# Patient Record
Sex: Male | Born: 1955 | Race: Black or African American | Hispanic: No | Marital: Single | Smoking: Former smoker
Health system: Southern US, Community
[De-identification: ages and names within clinical notes are randomized; demographics above are authoritative.]

## PROBLEM LIST (undated history)

## (undated) DIAGNOSIS — I4811 Longstanding persistent atrial fibrillation: Secondary | ICD-10-CM

## (undated) DIAGNOSIS — Z720 Tobacco use: Secondary | ICD-10-CM

## (undated) DIAGNOSIS — I10 Essential (primary) hypertension: Secondary | ICD-10-CM

## (undated) DIAGNOSIS — I340 Nonrheumatic mitral (valve) insufficiency: Secondary | ICD-10-CM

## (undated) DIAGNOSIS — I428 Other cardiomyopathies: Secondary | ICD-10-CM

## (undated) DIAGNOSIS — I5022 Chronic systolic (congestive) heart failure: Secondary | ICD-10-CM

## (undated) DIAGNOSIS — I2510 Atherosclerotic heart disease of native coronary artery without angina pectoris: Secondary | ICD-10-CM

## (undated) DIAGNOSIS — J45909 Unspecified asthma, uncomplicated: Secondary | ICD-10-CM

## (undated) DIAGNOSIS — F1490 Cocaine use, unspecified, uncomplicated: Secondary | ICD-10-CM

## (undated) DIAGNOSIS — E559 Vitamin D deficiency, unspecified: Secondary | ICD-10-CM

## (undated) HISTORY — DX: Vitamin D deficiency, unspecified: E55.9

---

## 2014-10-15 ENCOUNTER — Emergency Department (HOSPITAL_COMMUNITY)
Admission: EM | Admit: 2014-10-15 | Discharge: 2014-10-15 | Disposition: A | Discharge status: Home / Self Care | Payer: Self-pay | Attending: Emergency Medicine | Admitting: Emergency Medicine

## 2014-10-15 ENCOUNTER — Encounter (HOSPITAL_COMMUNITY): Payer: Self-pay | Admitting: Emergency Medicine

## 2014-10-15 DIAGNOSIS — G8929 Other chronic pain: Secondary | ICD-10-CM | POA: Insufficient documentation

## 2014-10-15 DIAGNOSIS — X503XXA Overexertion from repetitive movements, initial encounter: Secondary | ICD-10-CM

## 2014-10-15 DIAGNOSIS — M25511 Pain in right shoulder: Secondary | ICD-10-CM | POA: Insufficient documentation

## 2014-10-15 DIAGNOSIS — Z87891 Personal history of nicotine dependence: Secondary | ICD-10-CM | POA: Insufficient documentation

## 2014-10-15 DIAGNOSIS — T148XXA Other injury of unspecified body region, initial encounter: Secondary | ICD-10-CM

## 2014-10-15 DIAGNOSIS — M7989 Other specified soft tissue disorders: Secondary | ICD-10-CM | POA: Insufficient documentation

## 2014-10-15 MED ORDER — NAPROXEN 500 MG PO TABS: 500.0000 mg | ORAL_TABLET | Freq: Two times a day (BID) | ORAL | Status: DC | PRN

## 2014-10-15 MED ORDER — HYDROCODONE-ACETAMINOPHEN 5-325 MG PO TABS: 1.0000 | ORAL_TABLET | Freq: Four times a day (QID) | ORAL | Status: DC | PRN

## 2014-10-15 NOTE — Discharge Instructions (Signed)
Use naprosyn twice daily with food for 7 days, then start taking it as needed. Use norco as needed for severe pain, don't drive or operate heavy machinery while taking this medication. Use heat to the affected after, 20 minutes every hour. Follow up with the orthopedist listed above in 1 week for ongoing management. Perform gentle range of motion exercises as directed below to help with your shoulder injury. Return to the ER for changes or worsening symptoms.   Shoulder Pain The shoulder is the joint that connects your arms to your body. The bones that form the shoulder joint include the upper arm bone (humerus), the shoulder blade (scapula), and the collarbone (clavicle). The top of the humerus is shaped like a ball and fits into a rather flat socket on the scapula (glenoid cavity). A combination of muscles and strong, fibrous tissues that connect muscles to bones (tendons) support your shoulder joint and hold the ball in the socket. Small, fluid-filled sacs (bursae) are located in different areas of the joint. They act as cushions between the bones and the overlying soft tissues and help reduce friction between the gliding tendons and the bone as you move your arm. Your shoulder joint allows a wide range of motion in your arm. This range of motion allows you to do things like scratch your back or throw a ball. However, this range of motion also makes your shoulder more prone to pain from overuse and injury. Causes of shoulder pain can originate from both injury and overuse and usually can be grouped in the following four categories:  Redness, swelling, and pain (inflammation) of the tendon (tendinitis) or the bursae (bursitis).  Instability, such as a dislocation of the joint.  Inflammation of the joint (arthritis).  Broken bone (fracture). HOME CARE INSTRUCTIONS   Apply ice to the sore area.  Put ice in a plastic bag.  Place a towel between your skin and the bag.  Leave the ice on for 15-20  minutes, 3-4 times per day for the first 2 days, or as directed by your health care provider.  Stop using cold packs if they do not help with the pain.  If you have a shoulder sling or immobilizer, wear it as long as your caregiver instructs. Only remove it to shower or bathe. Move your arm as little as possible, but keep your hand moving to prevent swelling.  Squeeze a soft ball or foam pad as much as possible to help prevent swelling.  Only take over-the-counter or prescription medicines for pain, discomfort, or fever as directed by your caregiver. SEEK MEDICAL CARE IF:   Your shoulder pain increases, or new pain develops in your arm, hand, or fingers.  Your hand or fingers become cold and numb.  Your pain is not relieved with medicines. SEEK IMMEDIATE MEDICAL CARE IF:   Your arm, hand, or fingers are numb or tingling.  Your arm, hand, or fingers are significantly swollen or turn white or blue. MAKE SURE YOU:   Understand these instructions.  Will watch your condition.  Will get help right away if you are not doing well or get worse. Document Released: 04/22/2005 Document Revised: 11/27/2013 Document Reviewed: 06/27/2011 [facility] Patient Information 2015 [place], [place]. This information is not intended to replace advice given to you by your health care provider. Make sure you discuss any questions you have with your health care provider.  Repetitive Strain Injuries Repetitive strain injuries (RSIs) result from overuse or misuse of soft tissues including muscles, tendons,  or nerves. Tendons are the cord-like structures that attach muscles to bones. RSIs can affect almost any part of the body. However, RSIs are most common in the arms (thumbs, wrists, elbows, shoulders) and legs (ankles, knees). Common medical conditions that are often caused by repetitive strain include carpal tunnel syndrome, tennis or golfer's elbow, bursitis, and tendonitis. If RSIs are treated early, and  therepeated activity is reduced or removed, the severity and length of your problems can usually be reduced. RSIs are also called cumulative trauma disorders (CTD).  CAUSES  Many RSIs occur due to repeating the same activity at work over weeks or months without sufficient rest, such as prolonged typing. RSIs also commonly occur when a hobby or sport is done repeatedly without sufficient rest. RSIs can also occur due to repeated strain or stress on a body part in someone who has one or more risk factors for RSIs. RISK FACTORS Workplace risk factors  Frequent computer use, especially if your workstation is not adjusted for your body type.  Infrequent rest breaks.  Working in a high-pressure environment.  Working at a [employer].  Repeating the same motion, such as frequent typing.  Working in an awkward position or holding the same position for a long time.  Forceful movements such as lifting, pulling, or pushing.  Vibration caused by using power tools.  Working in cold temperatures.  Job stress. Personal risk factors  Poor posture.  Being loose-jointed.  Not exercising regularly.  Being overweight.  Arthritis, diabetes, thyroid problems, or other long-term (chronic)medical conditions.  Vitamin deficiencies.  Keeping your fingernails long.  An unhealthy, stressful, or inactive lifestyle.  Not sleeping well. SYMPTOMS  Symptoms often begin at work but become more noticeable after the repeated stress has ended. For example, you may develop fatigue or soreness in your wrist while typingat work, and at night you may develop numbness and tingling in your fingers. Common symptoms include:   Burning, shooting, or aching pain, especially in the fingers, palms, wrists, forearms, or shoulders.  Tenderness.  Swelling.  Tingling, numbness, or loss of feeling.  Pain with certain activities, such as turning a doorknob or reaching above your head.  Weakness, heaviness, or  loss of coordination in yourhand.  Muscle spasms or tightness. In some cases, symptoms can become so intense that it is difficult to perform everyday tasks. Symptoms that do not improve with rest may indicate a more serious condition.  DIAGNOSIS  Your caregiver may determine the type ofRSI you have based on your medical evaluation and a description of your activities.  TREATMENT  Treatment depends on the severity and type of RSI you have. Your caregiver may recommend rest for the affected body part, medicines, and physical or occupational therapy to reduce pain, swelling, and soreness. Discuss the activities you do repeatedly with your caregiver. Your caregiver can help you decide whether you need to change your activities. An RSI may take months or years to heal, especially if the affected body part gets insufficient rest. In some cases, such as severe carpal tunnel syndrome, surgery may be recommended. PREVENTION  Talk with your supervisor to make sure you have the proper equipment for your work station.  Maintain good posture at your desk or work station with:  Feet flat on the floor.  Knees directly over the feet, bent at a right angle.  Lower back supported by your chair or a cushion in the curve of your lower back.  Shoulders and arms relaxed and at your sides.  Neck relaxed and not bent forwards or backwards.  Your desk and computer workstation properly adjusted to your body type.  Your chair adjusted so there is no excess pressure on the back of your thighs.  The keyboard resting above your thighs. You should be able to reach the keys with your elbows at your side, bent at a right angle. Your arms should be supported on forearm rests, with your forearms parallel to the ground.  The computer mouse within easy reach.  The monitor directly in front of you, so that your eyes are aligned with the top of the screen. The screen should be about 15 to 25 inches from your  eyes.  While typing, keep your wrist straight, in a neutral position. Move your entire arm when you move your mouse or when typing hard-to-reach keys.  Only use your computer as much as you need to for work. Do not use it during breaks.  Take breaks often from any repeated activity. Alternate with another task which requires you to use different muscles, or rest at least once every hour.  Change positions regularly. If you spend a lot of time sitting, get up, walk around, and stretch.  Do not hold pens or pencils tightly when writing.  Exercise regularly.  Maintain a normal weight.  Eat a diet with plenty of vegetables, whole grains, and fruit.  Get sufficient, restful sleep. HOME CARE INSTRUCTIONS  If your caregiver prescribed medicine to help reduce swelling, take it as directed.  Only take over-the-counter or prescription medicines for pain, discomfort, or fever as directed by your caregiver.  Reduce, and if needed, stopthe activities that are causing your problems until you have no further symptoms.If your symptoms are work-related, you may need to talk to your supervisor about changing your activities.  When symptoms develop, put ice or a cold pack on the aching area.  Put ice in a plastic bag.  Place a towel between your skin and the bag.  Leave the ice on for 15-20 minutes.  If you were given a splint to keep your wrist from bending, wear it as instructed. It is important to wear the splint at night. Use the splint for as long as your caregiver recommends. SEEK MEDICAL CARE IF:  You develop new problems.  Your problems do not get better with medicine. MAKE SURE YOU:  Understand these instructions.  Will watch your condition.  Will get help right away if you are not doing well or get worse. Document Released: 07/03/2002 Document Revised: 01/12/2012 Document Reviewed: 09/03/2011 [facility] Patient Information 2015 [place], [place]. This information is not intended  to replace advice given to you by your health care provider. Make sure you discuss any questions you have with your health care provider.  Shoulder Range of Motion Exercises The shoulder is the most flexible joint in the human body. Because of this it is also the most unstable joint in the body. All ages can develop shoulder problems. Early treatment of problems is necessary for a good outcome. People react to shoulder pain by decreasing the movement of the joint. After a brief period of time, the shoulder can become "frozen". This is an almost complete loss of the ability to move the damaged shoulder. Following injuries your caregivers can give you instructions on exercises to keep your range of motion (ability to move your shoulder freely), or regain it if it has been lost.  EXERCISES EXERCISES TO MAINTAIN THE MOBILITY OF YOUR SHOULDER: Codman's Exercise or Pendulum  Exercise  This exercise may be performed in a prone (face-down) lying position or standing while leaning on a chair with the opposite arm. Its purpose is to relax the muscles in your shoulder and slowly but surely increase the range of motion and to relieve pain.  Lie on your stomach close to the side edge of the bed. Let your weak arm hang over the edge of the bed. Relax your shoulder, arm and hand. Let your shoulder blade relax and drop down.  Slowly and gently swing your arm forward and back. Do not use your neck muscles; relax them. It might be easier to have someone else gently start swinging your arm.  As pain decreases, increase your swing. To start, arm swing should begin at 15 degree angles. In time and as pain lessens, move to 30-45 degree angles. Start with swinging for about 15 seconds, and work towards swinging for 3 to 5 minutes.  This exercise may also be performed in a standing/bent over position.  Stand and hold onto a sturdy chair with your good arm. Bend forward at the waist and bend your knees slightly to help  protect your back. Relax your weak arm, let it hang limp. Relax your shoulder blade and let it drop.  Keep your shoulder relaxed and use body motion to swing your arm in small circles.  Stand up tall and relax.  Repeat motion and change direction of circles.  Start with swinging for about 30 seconds, and work towards swinging for 3 to 5 minutes. STRETCHING EXERCISES:  Lift your arm out in front of you with the elbow bent at 90 degrees. Using your other arm gently pull the elbow forward and across your body.  Bend one arm behind you with the palm facing outward. Using the other arm, hold a towel or rope and reach this arm up above your head, then bend it at the elbow to move your wrist to behind your neck. Grab the free end of the towel with the hand behind your back. Gently pull the towel up with the hand behind your neck, gradually increasing the pull on the hand behind the small of your back. Then, gradually pull down with the hand behind the small of your back. This will pull the hand and arm behind your neck further. Both shoulders will have an increased range of motion with repetition of this exercise. STRENGTHENING EXERCISES:  Standing with your arm at your side and straight out from your shoulder with the elbow bent at 90 degrees, hold onto a small weight and slowly raise your hand so it points straight up in the air. Repeat this five times to begin with, and gradually increase to ten times. Do this four times per day. As you grow stronger you can gradually increase the weight.  Repeat the above exercise, only this time using an elastic band. Start with your hand up in the air and pull down until your hand is by your side. As you grow stronger, gradually increase the amount you pull by increasing the number or size of the elastic bands. Use the same amount of repetitions.  Standing with your hand at your side and holding onto a weight, gradually lift the hand in front of you until it is  over your head. Do the same also with the hand remaining at your side and lift the hand away from your body until it is again over your head. Repeat this five times to begin with, and gradually  increase to ten times. Do this four times per day. As you grow stronger you can gradually increase the weight. Document Released: 04/11/2003 Document Revised: 07/18/2013 Document Reviewed: 07/13/2005 [facility] Patient Information 2015 [place], [place]. This information is not intended to replace advice given to you by your health care provider. Make sure you discuss any questions you have with your health care provider.  Heat Therapy Heat therapy can help ease sore, stiff, injured, and tight muscles and joints. Heat relaxes your muscles, which may help ease your pain.  RISKS AND COMPLICATIONS If you have any of the following conditions, do not use heat therapy unless your health care provider has approved:  Poor circulation.  Healing wounds or scarred skin in the area being treated.  Diabetes, heart disease, or high blood pressure.  Not being able to feel (numbness) the area being treated.  Unusual swelling of the area being treated.  Active infections.  Blood clots.  Cancer.  Inability to communicate pain. This may include young children and people who have problems with their brain function (dementia).  Pregnancy. Heat therapy should only be used on old, pre-existing, or long-lasting (chronic) injuries. Do not use heat therapy on new injuries unless directed by your health care provider. HOW TO USE HEAT THERAPY There are several different kinds of heat therapy, including:  Moist heat pack.  Warm water bath.  Hot water bottle.  Electric heating pad.  Heated gel pack.  Heated wrap.  Electric heating pad. Use the heat therapy method suggested by your health care provider. Follow your health care provider's instructions on when and how to use heat therapy. GENERAL HEAT THERAPY  RECOMMENDATIONS  Do not sleep while using heat therapy. Only use heat therapy while you are awake.  Your skin may turn pink while using heat therapy. Do not use heat therapy if your skin turns red.  Do not use heat therapy if you have new pain.  High heat or long exposure to heat can cause burns. Be careful when using heat therapy to avoid burning your skin.  Do not use heat therapy on areas of your skin that are already irritated, such as with a rash or sunburn. SEEK MEDICAL CARE IF:  You have blisters, redness, swelling, or numbness.  You have new pain.  Your pain is worse. MAKE SURE YOU:  Understand these instructions.  Will watch your condition.  Will get help right away if you are not doing well or get worse. Document Released: 10/05/2011 Document Revised: 11/27/2013 Document Reviewed: 09/05/2013 [facility] Patient Information 2015 [place], [place]. This information is not intended to replace advice given to you by your health care provider. Make sure you discuss any questions you have with your health care provider.

## 2014-10-15 NOTE — ED Notes (Signed)
Pt c/o right shoulder pain, states he thinks it was from the flu shot 2 months ago. States his ROM is bad.

## 2014-10-15 NOTE — ED Provider Notes (Signed)
CSN: [id number]     Arrival date & time 10/15/14  1244 History  This chart was scribed for non-physician practitioner Liborio Nixon- Soms, PA-C working with Purvis Sheffield, MD by Carl Best, ED Scribe. This patient was seen in room WTR8/WTR8 and the patient's care was started at 1:41 PM.    Chief Complaint  Patient presents with  . Shoulder Pain   HPI Comments: Kevin [name] is a 59 y.o. male with a PMHx of asthma, who presents to the Emergency Department complaining of gradually worsening, non-radiating, intermittent, sharp right shoulder pain that started 2 months ago after receiving a flu shot for work.  He started working 5 days after his flu shot He had never received a flu shot prior to this time.  He rates his pain 2-3/10 currently.  Movement aggravates his pain.  He used Salonpas and taken Advil and Aleve with temporary relief to his pain.  He stopped taking Salonpas cream due to skin irritation.  He denies neck pain, numbness and tingling, chest pain, SOB, abdominal pain, headaches, blurred vision, nausea, vomiting, fever, chills, and weakness.  He does not have a history of hypertension.    Patient is a 59 y.o. male presenting with shoulder pain. The history is provided by the patient. No language interpreter was used.  Shoulder Pain Location:  Shoulder Time since incident:  2 months Injury: no   Shoulder location:  R shoulder Pain details:    Quality:  Sharp   Radiates to:  Does not radiate   Severity:  Mild   Onset quality:  Gradual   Duration:  2 months   Timing:  Intermittent   Progression:  Worsening Chronicity:  New Dislocation: no   Prior injury to area:  No Relieved by:  NSAIDs and arthritis medications (salon pas, aleve, ibuprofen) Worsened by:  Movement Ineffective treatments:  None tried Associated symptoms: no back pain, no decreased range of motion, no fever, no muscle weakness, no neck pain, no numbness, no stiffness, no swelling and no tingling      History reviewed. No pertinent past medical history. History reviewed. No pertinent past surgical history. No family history on file. History  Substance Use Topics  . Smoking status: Former Games developer  . Smokeless tobacco: Not on file  . Alcohol Use: No    Review of Systems  Constitutional: Negative for fever and chills.  Eyes: Negative for visual disturbance.  Respiratory: Negative for shortness of breath.   Cardiovascular: Negative for chest pain.  Gastrointestinal: Negative for nausea, vomiting and abdominal pain.  Musculoskeletal: Positive for arthralgias. Negative for back pain, joint swelling, stiffness, neck pain and neck stiffness.  Skin: Negative for color change.  Allergic/Immunologic: Negative for immunocompromised state.  Neurological: Negative for weakness, numbness and headaches.   10 Systems reviewed and all are negative for acute change except as noted in the HPI.  Allergies  Review of patient's allergies indicates no known allergies.  Home Medications   Prior to Admission medications   Not on File   Triage Vitals: BP 144/105 mmHg  Pulse 97  Temp(Src) 98.2 F (36.8 C) (Oral)  Resp 21  SpO2 98%  Physical Exam  Constitutional: He is oriented to person, place, and time. Vital signs are normal. He appears well-developed and well-nourished.  Non-toxic appearance. No distress.  Afebrile, nontoxic, NAD  HENT:  Head: Normocephalic and atraumatic.  Mouth/Throat: Mucous membranes are normal.  Eyes: Conjunctivae and EOM are normal. Right eye exhibits no discharge. Left eye exhibits no discharge.  Neck: Normal range of motion. Neck supple. No spinous process tenderness and no muscular tenderness present. No rigidity. Normal range of motion present.  FROM intact without spinous process or paraspinous muscle TTP, no bony stepoffs or deformities, no muscle spasms.  Cardiovascular: Normal rate and intact distal pulses.   Pulmonary/Chest: Effort normal. No respiratory  distress.  Abdominal: Normal appearance. He exhibits no distension.  Musculoskeletal: Normal range of motion.  Right shoulder with FROM intact.  No focal bony TTP, with no TTP along any muscular insertion sites.  Empty can test positive.  Negative Apley scratch, negative pain with resisted internal/external rotation.  Strength and sensation grossly intact.  No joint swelling, erythema, or warmth.  Neurological: He is alert and oriented to person, place, and time. He has normal strength. No sensory deficit.  Skin: Skin is warm, dry and intact. No rash noted.  Psychiatric: He has a normal mood and affect.  Nursing note and vitals reviewed.   ED Course  Procedures (including critical care time)  DIAGNOSTIC STUDIES: Oxygen Saturation is 98% on room air, normal by my interpretation.    COORDINATION OF CARE: 1:48 PM- Will refer the patient to an orthopedist for further evaluation and discharge patient with Naprosyn to be taken once daily until his appointment with the orthopedist, narcotic pain medication, and antiinflammatory.  Advised the patient to discontinue using Salonpas and apply heat to the area.  The patient agreed to the treatment plan.    Labs Review Labs Reviewed - No data to display  Imaging Review No results found.   EKG Interpretation None      MDM   Final diagnoses:  Chronic shoulder pain, right  Repetitive motion injury    59 y.o. male here with R shoulder pain x2 months. Was previously in a job with repetitive motion. Neurovascularly intact with soft compartments. No bony TTP. Pain with empty can testing. Likely repetitive motion strain vs rotator cuff injury. Given that it's not acute, will not immobilize joint in order to avoid adhesive capsulitis. Will have him f/up with ortho. Discussed heat therapy and pain control. Discussed naprosyn BID x7 days then PRN after. I explained the diagnosis and have given explicit precautions to return to the ER including for any  other new or worsening symptoms. The patient understands and accepts the medical plan as it's been dictated and I have answered their questions. Discharge instructions concerning home care and prescriptions have been given. The patient is STABLE and is discharged to home in good condition.   I personally performed the services described in this documentation, which was scribed in my presence. The recorded information has been reviewed and is accurate.  BP 144/105 mmHg  Pulse 97  Temp(Src) 98.2 F (36.8 C) (Oral)  Resp 21  SpO2 98%  Meds ordered this encounter  Medications  . naproxen (NAPROSYN) 500 MG tablet    Sig: Take 1 tablet (500 mg total) by mouth 2 (two) times daily as needed for mild pain, moderate pain or headache (TAKE WITH MEALS.).    Dispense:  20 tablet    Refill:  0    Order Specific Question:  Supervising Provider    Answer:  MILLER, BRIAN [[id number]]  . HYDROcodone-acetaminophen (NORCO) 5-325 MG per tablet    Sig: Take 1 tablet by mouth every 6 (six) hours as needed for severe pain.    Dispense:  10 tablet    Refill:  0    Order Specific Question:  Supervising Provider  Answer:  Eber Hong [address] Keniyah Gelinas Camprubi-Soms, PA-C 10/15/14 1401  Purvis Sheffield, MD 10/15/14 2114

## 2015-07-23 ENCOUNTER — Emergency Department (HOSPITAL_COMMUNITY): Payer: Self-pay

## 2015-07-23 ENCOUNTER — Encounter (HOSPITAL_COMMUNITY): Payer: Self-pay | Admitting: Emergency Medicine

## 2015-07-23 ENCOUNTER — Inpatient Hospital Stay (HOSPITAL_COMMUNITY)
Admission: EM | Admit: 2015-07-23 | Discharge: 2015-07-28 | DRG: 286 | Disposition: A | Discharge status: Home / Self Care | Payer: Self-pay | Attending: Family Medicine | Admitting: Family Medicine

## 2015-07-23 DIAGNOSIS — M7501 Adhesive capsulitis of right shoulder: Secondary | ICD-10-CM | POA: Diagnosis present

## 2015-07-23 DIAGNOSIS — Z87891 Personal history of nicotine dependence: Secondary | ICD-10-CM

## 2015-07-23 DIAGNOSIS — I5022 Chronic systolic (congestive) heart failure: Secondary | ICD-10-CM | POA: Diagnosis present

## 2015-07-23 DIAGNOSIS — R7989 Other specified abnormal findings of blood chemistry: Secondary | ICD-10-CM | POA: Diagnosis present

## 2015-07-23 DIAGNOSIS — I428 Other cardiomyopathies: Secondary | ICD-10-CM | POA: Insufficient documentation

## 2015-07-23 DIAGNOSIS — Z8249 Family history of ischemic heart disease and other diseases of the circulatory system: Secondary | ICD-10-CM

## 2015-07-23 DIAGNOSIS — R778 Other specified abnormalities of plasma proteins: Secondary | ICD-10-CM | POA: Diagnosis present

## 2015-07-23 DIAGNOSIS — R748 Abnormal levels of other serum enzymes: Secondary | ICD-10-CM | POA: Diagnosis present

## 2015-07-23 DIAGNOSIS — I429 Cardiomyopathy, unspecified: Secondary | ICD-10-CM | POA: Diagnosis present

## 2015-07-23 DIAGNOSIS — I5021 Acute systolic (congestive) heart failure: Secondary | ICD-10-CM | POA: Diagnosis present

## 2015-07-23 DIAGNOSIS — I480 Paroxysmal atrial fibrillation: Secondary | ICD-10-CM | POA: Diagnosis present

## 2015-07-23 DIAGNOSIS — I340 Nonrheumatic mitral (valve) insufficiency: Secondary | ICD-10-CM | POA: Diagnosis present

## 2015-07-23 DIAGNOSIS — J449 Chronic obstructive pulmonary disease, unspecified: Secondary | ICD-10-CM | POA: Diagnosis present

## 2015-07-23 DIAGNOSIS — I4581 Long QT syndrome: Secondary | ICD-10-CM | POA: Diagnosis present

## 2015-07-23 DIAGNOSIS — J45909 Unspecified asthma, uncomplicated: Secondary | ICD-10-CM | POA: Diagnosis present

## 2015-07-23 DIAGNOSIS — R911 Solitary pulmonary nodule: Secondary | ICD-10-CM | POA: Diagnosis present

## 2015-07-23 DIAGNOSIS — I2510 Atherosclerotic heart disease of native coronary artery without angina pectoris: Secondary | ICD-10-CM | POA: Diagnosis present

## 2015-07-23 DIAGNOSIS — I4891 Unspecified atrial fibrillation: Principal | ICD-10-CM | POA: Diagnosis present

## 2015-07-23 HISTORY — DX: Atherosclerotic heart disease of native coronary artery without angina pectoris: I25.10

## 2015-07-23 HISTORY — DX: Nonrheumatic mitral (valve) insufficiency: I34.0

## 2015-07-23 HISTORY — DX: Unspecified asthma, uncomplicated: J45.909

## 2015-07-23 HISTORY — DX: Other cardiomyopathies: I42.8

## 2015-07-23 HISTORY — DX: Chronic systolic (congestive) heart failure: I50.22

## 2015-07-23 LAB — BASIC METABOLIC PANEL
Anion gap: 8 (ref 5–15)
BUN: 11 mg/dL (ref 6–20)
CO2: 26 mmol/L (ref 22–32)
Calcium: 9.3 mg/dL (ref 8.9–10.3)
Chloride: 107 mmol/L (ref 101–111)
Creatinine, Ser: 0.95 mg/dL (ref 0.61–1.24)
GFR calc Af Amer: >60 mL/min (ref >60)
GFR calc non Af Amer: >60 mL/min (ref >60)
GLUCOSE: 83 mg/dL (ref 65–99)
Potassium: 3.8 mmol/L (ref 3.5–5.1)
Sodium: 141 mmol/L (ref 135–145)

## 2015-07-23 LAB — CBC
HEMATOCRIT: 40.3 % (ref 39.0–52.0)
HEMOGLOBIN: 13.1 g/dL (ref 13.0–17.0)
MCH: 26.4 pg (ref 26.0–34.0)
MCHC: 32.5 g/dL (ref 30.0–36.0)
MCV: 81.3 fL (ref 78.0–100.0)
Platelets: 224 10*3/uL (ref 150–400)
RBC: 4.96 MIL/uL (ref 4.22–5.81)
RDW: 14.4 % (ref 11.5–15.5)
WBC: 8.8 10*3/uL (ref 4.0–10.5)

## 2015-07-23 LAB — TROPONIN I: Troponin I: 0.07 ng/mL — ABNORMAL HIGH (ref <0.031)

## 2015-07-23 LAB — TSH: TSH: 1.189 u[IU]/mL (ref 0.350–4.500)

## 2015-07-23 LAB — I-STAT TROPONIN, ED: TROPONIN I, POC: 0.04 ng/mL (ref 0.00–0.08)

## 2015-07-23 LAB — BRAIN NATRIURETIC PEPTIDE: B Natriuretic Peptide: 692.4 pg/mL — ABNORMAL HIGH (ref 0.0–100.0)

## 2015-07-23 LAB — MAGNESIUM: Magnesium: 1.7 mg/dL (ref 1.7–2.4)

## 2015-07-23 MED ORDER — METOPROLOL TARTRATE 1 MG/ML IV SOLN
5.0000 mg | Freq: Once | INTRAVENOUS | Status: AC
Start: 2015-07-23 — End: 2015-07-23
  Administered 2015-07-23: 5 mg via INTRAVENOUS
  Filled 2015-07-23: qty 5

## 2015-07-23 MED ORDER — PANTOPRAZOLE SODIUM 40 MG PO TBEC
40.0000 mg | DELAYED_RELEASE_TABLET | Freq: Every day | ORAL | Status: DC
  Administered 2015-07-24 – 2015-07-28 (×6): 40 mg via ORAL
  Filled 2015-07-23 (×6): qty 1

## 2015-07-23 MED ORDER — ONDANSETRON HCL 4 MG PO TABS: 4.0000 mg | ORAL_TABLET | Freq: Four times a day (QID) | ORAL | Status: DC | PRN

## 2015-07-23 MED ORDER — ONDANSETRON HCL 4 MG/2ML IJ SOLN: 4.0000 mg | Freq: Four times a day (QID) | INTRAMUSCULAR | Status: DC | PRN

## 2015-07-23 MED ORDER — MAGNESIUM SULFATE 2 GM/50ML IV SOLN
2.0000 g | Freq: Once | INTRAVENOUS | Status: AC
  Administered 2015-07-23: 2 g via INTRAVENOUS
  Filled 2015-07-23: qty 50

## 2015-07-23 MED ORDER — ASPIRIN 81 MG PO CHEW
324.0000 mg | CHEWABLE_TABLET | Freq: Once | ORAL | Status: AC
  Administered 2015-07-23: 324 mg via ORAL
  Filled 2015-07-23: qty 4

## 2015-07-23 MED ORDER — SODIUM CHLORIDE 0.9 % IJ SOLN: 3.0000 mL | INTRAMUSCULAR | Status: DC | PRN

## 2015-07-23 MED ORDER — IOHEXOL 350 MG/ML SOLN
100.0000 mL | Freq: Once | INTRAVENOUS | Status: AC | PRN
  Administered 2015-07-23: 78 mL via INTRAVENOUS

## 2015-07-23 MED ORDER — ENOXAPARIN SODIUM 100 MG/ML ~~LOC~~ SOLN
1.0000 mg/kg | Freq: Two times a day (BID) | SUBCUTANEOUS | Status: DC
  Administered 2015-07-24: 100 mg via SUBCUTANEOUS
  Filled 2015-07-23 (×3): qty 1

## 2015-07-23 MED ORDER — SODIUM CHLORIDE 0.9 % IJ SOLN
3.0000 mL | Freq: Two times a day (BID) | INTRAMUSCULAR | Status: DC
  Administered 2015-07-24 – 2015-07-27 (×7): 3 mL via INTRAVENOUS

## 2015-07-23 MED ORDER — DILTIAZEM HCL 100 MG IV SOLR
5.0000 mg/h | INTRAVENOUS | Status: DC
  Administered 2015-07-23 – 2015-07-24 (×2): 5 mg/h via INTRAVENOUS
  Filled 2015-07-23: qty 100

## 2015-07-23 MED ORDER — SODIUM CHLORIDE 0.9 % IV SOLN: 250.0000 mL | INTRAVENOUS | Status: DC | PRN

## 2015-07-23 NOTE — Progress Notes (Signed)
CM spoke with pt who confirms uninsured [place] resident with no pcp.  CM discussed and provided written information for uninsured accepting pcps, discussed the importance of pcp vs EDP services for f/u care, www.needymeds.org, www.goodrx.com, discounted pharmacies and other [place] resources such as [facility] , [facility], affordable care act, financial assistance, uninsured dental services, [place] med assist, DSS and  health department  Reviewed resources for [place] uninsured accepting pcps like [facility], [facility] [address], [facility], [facility], local urgent care centers, [facility], [facility], general medical clinics, [facility], [facility] plus others, medication resources, [facility] out patient [facility]ies and housing Pt voiced understanding and appreciation of resources provided   Provided [facility] contact information Pt agreed to a referral Cm completed referral Pt to be contact by [facility] clinical liason  

## 2015-07-23 NOTE — Progress Notes (Deleted)
Pt pcp is at [facility]. [address], [address] [place], [place] [address] t: [phone] Visiting doctors Marlowe Aschoff per daughter

## 2015-07-23 NOTE — Progress Notes (Signed)
Entered in d/c instructions Please use the resources provided to you in emergency room by case manager to assist with doctor for follow up Schedule an appointment as soon as possible for a visit on 07/24/2015 A referral for you has been sent to [facility] if you have not received a call in 3 days you may contact them Call Scherry Ran at [phone] Tuesday-Friday [email or web address] These [place] uninsured resources provide possible primary care providers, resources for discounted medications, housing, dental resources, affordable care act information, plus other resources for [facility]

## 2015-07-23 NOTE — ED Notes (Signed)
Pt just arriving to room,  Nurse will draw labs

## 2015-07-23 NOTE — ED Notes (Addendum)
Spoke with Elnita Maxwell, RN charge nurse in ICU. Bed assignment will be changed to 1228.

## 2015-07-23 NOTE — ED Provider Notes (Signed)
CSN: [id number]     Arrival date & time 07/23/15  1329 History   First MD Initiated Contact with Patient 07/23/15 1457     Chief Complaint  Patient presents with  . Shortness of Breath  . Irregular Heart Beat     (Consider location/radiation/quality/duration/timing/severity/associated sxs/prior Treatment) Patient is a 59 y.o. male presenting with shortness of breath. The history is provided by the patient and medical records. No language interpreter was used.  Shortness of Breath Associated symptoms: no abdominal pain, no cough, no headaches, no neck pain, no rash, no sore throat, no vomiting and no wheezing    Kevin [name] is a 59 y.o. male  with a PMH of asthma who presents to the Emergency Department complaining of dyspnea on exertion x 6 days. Patient states he normally walks 5 minutes to the grocery store, then walks home with no trouble. Over the past 6 days, he has been winded and needs to stop and catch his breath multiple times during his walk. He denies asscociated chest pain, head aches, palpitations, wheezing, recent illness or other associated symptoms. Upon arrival he was noticed to have an irregular heart rate. He states that his PCP had noticed this at his last appointment 8-9 months ago, and set him up for an outpatient EKG and workup, however, he moved to [facility] and therefore, did not make the follow up appointment.    Past Medical History  Diagnosis Date  . Asthma    History reviewed. No pertinent past surgical history. History reviewed. No pertinent family history. Social History  Substance Use Topics  . Smoking status: Former Games developer  . Smokeless tobacco: None  . Alcohol Use: No    Review of Systems  Constitutional: Negative.   HENT: Negative for congestion, rhinorrhea and sore throat.   Eyes: Negative for visual disturbance.  Respiratory: Positive for shortness of breath. Negative for cough and wheezing.   Cardiovascular: Negative.   Gastrointestinal:  Negative for nausea, vomiting, abdominal pain, diarrhea and constipation.  Musculoskeletal: Negative for myalgias, back pain, arthralgias and neck pain.  Skin: Negative for rash.  Neurological: Negative for dizziness, weakness and headaches.  Hematological: Does not bruise/bleed easily.      Allergies  Review of patient's allergies indicates no known allergies.  Home Medications   Prior to Admission medications   Medication Sig Start Date End Date Taking? Authorizing Provider  ePHEDrine-GuaiFENesin (BRONKAID) 25-400 MG TABS Take 1-2 tablets by mouth every 12 (twelve) hours as needed (congestion,cough,cold).   Yes Historical Provider, MD  ePHEDrine-GuaiFENesin (PRIMATENE ASTHMA PO) Take 1 tablet by mouth daily as needed (breathing).   Yes Historical Provider, MD   BP 136/102 mmHg  Pulse 51  Temp(Src) 98.4 F (36.9 C) (Oral)  Resp 24  SpO2 95% Physical Exam  Constitutional: He is oriented to person, place, and time. He appears well-developed and well-nourished.  Alert, speaking in full sentences, and in no acute distress  HENT:  Head: Normocephalic and atraumatic.  Neck: Normal range of motion. Neck supple. No JVD present.  No carotid bruits  Cardiovascular: Intact distal pulses.  Exam reveals no gallop and no friction rub.   No murmur heard. Irregularly irregular  Pulmonary/Chest: Effort normal. No respiratory distress. He exhibits no tenderness.  Crackles in bilateral lung fields; no wheezing  Abdominal: He exhibits no mass. There is no rebound and no guarding.  Abdomen soft, non-tender, non-distended Bowel sounds positive in all four quadrants  Musculoskeletal: He exhibits no edema.  Lymphadenopathy:    He  has no cervical adenopathy.  Neurological: He is alert and oriented to person, place, and time. No cranial nerve deficit.  Skin: Skin is warm and dry. No rash noted.  Psychiatric: He has a normal mood and affect. His behavior is normal. Judgment and thought content  normal.  Nursing note and vitals reviewed.   ED Course  Procedures (including critical care time) Labs Review Labs Reviewed  BRAIN NATRIURETIC PEPTIDE - Abnormal; Notable for the following:    B Natriuretic Peptide 692.4 (*)    All other components within normal limits  TROPONIN I - Abnormal; Notable for the following:    Troponin I 0.07 (*)    All other components within normal limits  BASIC METABOLIC PANEL  CBC  MAGNESIUM  TSH  I-STAT TROPOININ, ED    Imaging Review Dg Chest 2 View  07/23/2015  CLINICAL DATA:  Shortness of breath for 5 days, history asthma, former smoker EXAM: CHEST  2 VIEW COMPARISON:  None FINDINGS: Enlargement of cardiac silhouette with pulmonary vascular congestion. Elongation of thoracic aorta. Lungs minimally hyperinflated but grossly clear. No definite infiltrate, pleural effusion or pneumothorax. Bones unremarkable. IMPRESSION: Enlargement of cardiac silhouette with pulmonary vascular congestion. No definite acute infiltrate. Electronically Signed   By: Ulyses Southward M.D.   On: 07/23/2015 14:19   Ct Angio Chest Pe W/cm &/or Wo Cm  07/23/2015  CLINICAL DATA:  Shortness of breath. EXAM: CT ANGIOGRAPHY CHEST WITH CONTRAST TECHNIQUE: Multidetector CT imaging of the chest was performed using the standard protocol during bolus administration of intravenous contrast. Multiplanar CT image reconstructions and MIPs were obtained to evaluate the vascular anatomy. CONTRAST:  78mL OMNIPAQUE IOHEXOL 350 MG/ML SOLN COMPARISON:  Radiograph of same day. FINDINGS: No pneumothorax is noted. Mild right pleural effusion is noted. 9 mm subpleural nodule is noted in right lateral costophrenic sulcus. Minimal right posterior basilar subsegmental atelectasis is noted. Minimal opacity is noted anteriorly in the right upper lobe which may represent scarring or acute inflammation. Mild cardiomegaly is noted. There is no definite evidence of pulmonary embolus. Mildly enlarged mediastinal  lymph nodes are noted which most likely are inflammatory or reactive in etiology. Visualized portion of upper abdomen is unremarkable. No significant osseous abnormality is noted. Review of the MIP images confirms the above findings. IMPRESSION: No evidence of pulmonary embolus. Mild right pleural effusion is noted with minimal right posterior basilar subsegmental atelectasis. Minimal opacity is noted anteriorly in the right upper lobe which may represent scarring or acute inflammation. 9 mm nodule is noted in right lateral costophrenic sulcus. Followup unenhanced chest CT in 3 months is recommended to ensure stability. Electronically Signed   By: Lupita Raider, M.D.   On: 07/23/2015 17:42   I have personally reviewed and evaluated these images and lab results as part of my medical decision-making.   EKG Interpretation None      MDM   Final diagnoses:  New onset a-fib (HCC)  Kevin [name] presents with dyspnea on exertion x 6 days; Patient noticed to be in afib upon arrival. Per patient, he had an appointment with PCP ~ 8 months ago and was told he needed an EKG and further workup as an outpatient for his heart. Unsure of how long patient has been in afib.   Labs: Trop 0.07, BNP 692.4; CBC, BMP, TSH, and mag wdl ; Repeat trop 0.04 Imaging: CXR shows enlarged cardiac silhouette with pulm. Vascular congestion.  CT angio performed to r/o pe due to increased HR, new onset  afib, and CXR abnormalities - no evidence of PE on imaging.   Consults: Medicine   Therapeutics: ASA 324mg   Plan: Discussed patient with hospitalist Dr. Robb Matar who will admit for ACS rule out, new onset afib.   Patient seen by and discussed with Dr. Corlis Leak who agrees with treatment plan.   Chase Picket Ward, PA-C 07/23/15 2052  Courteney Randall An, MD 07/23/15 2348

## 2015-07-23 NOTE — ED Notes (Signed)
Patient transported to X-ray 

## 2015-07-23 NOTE — Clinical Social Work Note (Signed)
Clinical Social Work Assessment  Patient Details  Name: Kevin [name] MRN: [id number] Date of Birth: Dec 14, 1955  Date of referral:  07/23/15               Reason for consult:   (Patient is from [facility])                Permission sought to share information with:   (NONE.) Permission granted to share information::  No  Name::        Agency::     Relationship::     Contact Information:     Housing/Transportation Living arrangements for the past 2 months:   (Patient states he has been a resident at [place].) Source of Information:  Patient Patient Interpreter Needed:  None Criminal Activity/Legal Involvement Pertinent to Current Situation/Hospitalization:  No - Comment as needed Significant Relationships:  Adult Children, Other(Comment) (Patient informed CSW that he has a good support system which consist of family.) Lives with:  Facility Resident Do you feel safe going back to the place where you live?  Yes Need for family participation in patient care:  Yes (Comment) (Patient states his son, and family are a great support.)  Care giving concerns:  There are no care giving concerns at this time. The patient informed CSW that he completes his ADL's independently.   Social Worker assessment / plan:  CSW met with patient at bedside. Patient states that presents to [facility] due to SOB, and irregular heart beat. Patient states that he [facility] [place]. Patient states that he has been staying there for the past 8 months.  Patient states that he completes his ADL's independently and is not interested in a facility. Patient states that he is still working. Patient states that he ambulates with no equipment. Patient is not interested in a facility.  Employment status:   (Patient informed CSW that he is not retired. Patient states he sill  works.) Forensic scientist:   (None Listed.) PT  Recommendations:  Not assessed at this time Information / Referral to community resources:   (Patient is not interested in facility information.)  Patient/Family's Response to care:  Patient is accepting and aware that he will be admitted.   Patient/Family's Understanding of and Emotional Response to Diagnosis, Current Treatment, and Prognosis:  Patient is understanding and does not have any questions at this time.  Emotional Assessment Appearance:  Appears stated age Attitude/Demeanor/Rapport:   (Appropriate.) Affect (typically observed):  Accepting Orientation:  Oriented to Self, Oriented to Place, Oriented to  Time, Oriented to Situation Alcohol / Substance use:   (Uknown.) Psych involvement (Current and /or in the community):  No (Comment)  Discharge Needs  Concerns to be addressed:  Adjustment to Illness Readmission within the last 30 days:  No Current discharge risk:  None Barriers to Discharge:  No Barriers Identified   Bernita Buffy, LCSW 07/23/2015, 11:16 PM

## 2015-07-23 NOTE — ED Notes (Signed)
Pt has been out of asthma medications for the past 8 months. Pt having sob with exertion for the past 5 days. Pt also found to have an irregular HR in triage. Pt mentioned his PCP noticed the same thing during his appointment 8 months ago, but pt has not followed up regarding this. Pt NAD.

## 2015-07-23 NOTE — ED Notes (Signed)
Patient transported to CT 

## 2015-07-23 NOTE — H&P (Signed)
[facility] History and Physical  Kevin [name] PHX:[id number] DOB: [birth year] DOA: 07/23/2015  Referring physician: Chase Picket Ward, PA-C PCP: No primary care provider on file.   Chief Complaint: Shortness of breath.  HPI: Kevin [name] is a 59 y.o. male with a past medical history of asthma/COPD, former 24 pack/year smoker who comes to the ER due to dyspnea for the past 5 days. He states he has been out of his albuterol inhaler for asthma in the past 8 months. Per patient, a few days ago, he noticed that he was getting fatigued while walking to the store near his house, which usually takes about 5 minutes. He states that his dyspnea continued to get worse to the point that he had to rest, at least once on the way to or from the store. Most recently, he had to ask a family member to take him to the store and helping with his groceries, because he was so fatigued, that he was afraid he was going to be unable to do his grocery shopping and carry his groceries walking back home. His dyspnea is associated with dry cough, but he denies fever or chills, chest pain, palpitations, dizziness, diaphoresis, PND, orthopnea or pitting edema of the lower extremities.  In the ER, the patient was noticed to have an irregular heartbeat, EKG tracing and telemetry monitoring are consistent with atrial fibrillation with RVR.   Review of Systems:  Constitutional:  Positive for fatigue. No weight loss, night sweats, Fevers, chills,   HEENT:  No headaches, Difficulty swallowing,Tooth/dental problems,Sore throat,  No sneezing, itching, ear ache, nasal congestion, post nasal drip,  Cardio-vascular:  No chest pain, Orthopnea, PND, swelling in lower extremities, anasarca, dizziness, palpitations  GI:  No heartburn, indigestion, abdominal pain, nausea, vomiting, diarrhea, change in bowel habits, loss of appetite  Resp:  Dyspnea, nonproductive cough, wheezing. No hemoptysis. Skin:  no rash or lesions.    GU:  no dysuria, change in color of urine, no urgency or frequency. No flank pain.  Musculoskeletal:  No joint pain or swelling. No decreased range of motion. No back pain.  Psych:  No change in mood or affect. No depression or anxiety. No memory loss.   Past Medical History  Diagnosis Date  . Asthma    History reviewed. No pertinent past surgical history. Social History:  reports that he has quit smoking. He does not have any smokeless tobacco history on file. He reports that he does not drink alcohol. His drug history is not on file.  No Known Allergies  History reviewed. No pertinent family history.   Prior to Admission medications   Medication Sig Start Date End Date Taking? Authorizing Provider  ePHEDrine-GuaiFENesin (BRONKAID) 25-400 MG TABS Take 1-2 tablets by mouth every 12 (twelve) hours as needed (congestion,cough,cold).   Yes Historical Provider, MD  ePHEDrine-GuaiFENesin (PRIMATENE ASTHMA PO) Take 1 tablet by mouth daily as needed (breathing).   Yes Historical Provider, MD   Physical Exam: Filed Vitals:   07/23/15 1645 07/23/15 1700 07/23/15 1815 07/23/15 1845  BP:  130/94 148/98 136/102  Pulse:  47 52 51  Temp:      TempSrc:      Resp: 19  20 24   SpO2:  97% 95% 95%    Wt Readings from Last 3 Encounters:  No data found for Wt    General:  Appears calm and comfortable Eyes: PERRL, normal lids, irises & conjunctiva ENT: grossly normal hearing, lips & tongue Neck: no LAD, masses or thyromegaly,  no JVD, no bruits. Cardiovascular: Irregularly irregular at 132 bpm, no m/r/g. No LE edema. Telemetry: Atrial fibrillation. Respiratory: Bilateral mild wheezing, no w/r/r. Normal respiratory effort. Abdomen: soft, ntnd Skin: no rash or induration seen on limited exam Musculoskeletal: grossly normal tone BUE/BLE Psychiatric: grossly normal mood and affect, speech fluent and appropriate Neurologic: grossly non-focal.          Labs on Admission:  Basic Metabolic  Panel:  Recent Labs Lab 07/23/15 1442 07/23/15 1522  NA 141  --   K 3.8  --   CL 107  --   CO2 26  --   GLUCOSE 83  --   BUN 11  --   CREATININE 0.95  --   CALCIUM 9.3  --   MG  --  1.7   CBC:  Recent Labs Lab 07/23/15 1442  WBC 8.8  HGB 13.1  HCT 40.3  MCV 81.3  PLT 224   Cardiac Enzymes:  Recent Labs Lab 07/23/15 1522  TROPONINI 0.07*    BNP (last 3 results)  Recent Labs  07/23/15 1522  BNP 692.4*     Radiological Exams on Admission: Dg Chest 2 View  07/23/2015  CLINICAL DATA:  Shortness of breath for 5 days, history asthma, former smoker EXAM: CHEST  2 VIEW COMPARISON:  None FINDINGS: Enlargement of cardiac silhouette with pulmonary vascular congestion. Elongation of thoracic aorta. Lungs minimally hyperinflated but grossly clear. No definite infiltrate, pleural effusion or pneumothorax. Bones unremarkable. IMPRESSION: Enlargement of cardiac silhouette with pulmonary vascular congestion. No definite acute infiltrate. Electronically Signed   By: Ulyses Southward M.D.   On: 07/23/2015 14:19   Ct Angio Chest Pe W/cm &/or Wo Cm  07/23/2015  CLINICAL DATA:  Shortness of breath. EXAM: CT ANGIOGRAPHY CHEST WITH CONTRAST TECHNIQUE: Multidetector CT imaging of the chest was performed using the standard protocol during bolus administration of intravenous contrast. Multiplanar CT image reconstructions and MIPs were obtained to evaluate the vascular anatomy. CONTRAST:  78mL OMNIPAQUE IOHEXOL 350 MG/ML SOLN COMPARISON:  Radiograph of same day. FINDINGS: No pneumothorax is noted. Mild right pleural effusion is noted. 9 mm subpleural nodule is noted in right lateral costophrenic sulcus. Minimal right posterior basilar subsegmental atelectasis is noted. Minimal opacity is noted anteriorly in the right upper lobe which may represent scarring or acute inflammation. Mild cardiomegaly is noted. There is no definite evidence of pulmonary embolus. Mildly enlarged mediastinal lymph nodes  are noted which most likely are inflammatory or reactive in etiology. Visualized portion of upper abdomen is unremarkable. No significant osseous abnormality is noted. Review of the MIP images confirms the above findings. IMPRESSION: No evidence of pulmonary embolus. Mild right pleural effusion is noted with minimal right posterior basilar subsegmental atelectasis. Minimal opacity is noted anteriorly in the right upper lobe which may represent scarring or acute inflammation. 9 mm nodule is noted in right lateral costophrenic sulcus. Followup unenhanced chest CT in 3 months is recommended to ensure stability. Electronically Signed   By: Lupita Raider, M.D.   On: 07/23/2015 17:42    EKG: Independently reviewed. Vent. rate 108 BPM PR interval 112 ms QRS duration 90 ms QT/QTc 375/503 ms P-R-T axes 62 37 57 Sinus tachycardia Multiple premature complexes, vent & supraven Probable left atrial enlargement RSR' in V1 or V2, probably normal variant Prolonged QT interval  Assessment/Plan Principal Problem:   Atrial fibrillation with RVR (HCC)    CHADVASC score is 1 pending echocardiogram for evaluation of LV function.  Patient did not  respond to a single dose of metoprolol 5 mg IVP. He was started on a Cardizem infusion for rate control. Admit to a stepdown. Continue trending troponin levels. Magnesium sulfate 2 g IVPB. Monitor potassium and magnesium closely.    Elevated troponin   Elevated brain natriuretic peptide (BNP) level Continue cardiac monitoring. Continue monitoring levels. As above.  Active Problems:   COPD (chronic obstructive pulmonary disease) (HCC) Discontinue albuterol. Start Xopenex inhaler. The patient was advised to stop using albuterol due to atrial fibrillation. He was told to use Xopenex from now on to avoid rapid ventricular response.    Discussed briefly with cardiology on call Dr. Mayford Knife, who recommended to call again in the morning for official consult if  needed.   Code Status: Full code. DVT Prophylaxis: On full dose Lovenox. Family Communication:  Disposition Plan: Admit to a stepdown for rate control with Cardizem infusion.  Time spent: Over 70 minutes were spent in the process of this admission.  Bobette Mo [facility] Pager [phone].

## 2015-07-24 ENCOUNTER — Inpatient Hospital Stay (HOSPITAL_COMMUNITY): Payer: Self-pay

## 2015-07-24 DIAGNOSIS — I4891 Unspecified atrial fibrillation: Secondary | ICD-10-CM

## 2015-07-24 DIAGNOSIS — J449 Chronic obstructive pulmonary disease, unspecified: Secondary | ICD-10-CM

## 2015-07-24 LAB — TROPONIN I
TROPONIN I: 0.08 ng/mL — AB (ref <0.031)
TROPONIN I: 0.1 ng/mL — AB (ref <0.031)
Troponin I: 0.1 ng/mL — ABNORMAL HIGH (ref <0.031)

## 2015-07-24 LAB — CBC
HEMATOCRIT: 35.8 % — AB (ref 39.0–52.0)
HEMOGLOBIN: 11.6 g/dL — AB (ref 13.0–17.0)
MCH: 26.1 pg (ref 26.0–34.0)
MCHC: 32.4 g/dL (ref 30.0–36.0)
MCV: 80.4 fL (ref 78.0–100.0)
Platelets: 198 10*3/uL (ref 150–400)
RBC: 4.45 MIL/uL (ref 4.22–5.81)
RDW: 14.5 % (ref 11.5–15.5)
WBC: 6.1 10*3/uL (ref 4.0–10.5)

## 2015-07-24 LAB — COMPREHENSIVE METABOLIC PANEL
ALBUMIN: 3.4 g/dL — AB (ref 3.5–5.0)
ALK PHOS: 89 U/L (ref 38–126)
ALT: 27 U/L (ref 17–63)
ANION GAP: 6 (ref 5–15)
AST: 32 U/L (ref 15–41)
BUN: 12 mg/dL (ref 6–20)
CALCIUM: 8.9 mg/dL (ref 8.9–10.3)
CO2: 28 mmol/L (ref 22–32)
Chloride: 107 mmol/L (ref 101–111)
Creatinine, Ser: 0.98 mg/dL (ref 0.61–1.24)
GFR calc Af Amer: >60 mL/min (ref >60)
GFR calc non Af Amer: >60 mL/min (ref >60)
GLUCOSE: 96 mg/dL (ref 65–99)
Potassium: 4 mmol/L (ref 3.5–5.1)
SODIUM: 141 mmol/L (ref 135–145)
Total Bilirubin: 1.4 mg/dL — ABNORMAL HIGH (ref 0.3–1.2)
Total Protein: 6.5 g/dL (ref 6.5–8.1)

## 2015-07-24 LAB — APTT: APTT: 33 s (ref 24–37)

## 2015-07-24 LAB — MRSA PCR SCREENING: MRSA BY PCR: NEGATIVE

## 2015-07-24 LAB — PHOSPHORUS: Phosphorus: 4.1 mg/dL (ref 2.5–4.6)

## 2015-07-24 LAB — T4, FREE: Free T4: 1.06 ng/dL (ref 0.61–1.12)

## 2015-07-24 LAB — PROTIME-INR
INR: 1.09 (ref 0.00–1.49)
Prothrombin Time: 14.3 seconds (ref 11.6–15.2)

## 2015-07-24 LAB — TSH: TSH: 0.838 u[IU]/mL (ref 0.350–4.500)

## 2015-07-24 LAB — MAGNESIUM: Magnesium: 2.3 mg/dL (ref 1.7–2.4)

## 2015-07-24 MED ORDER — DILTIAZEM HCL 100 MG IV SOLR
INTRAVENOUS | Status: AC
  Filled 2015-07-24: qty 100

## 2015-07-24 MED ORDER — METOPROLOL TARTRATE 25 MG PO TABS
12.5000 mg | ORAL_TABLET | Freq: Two times a day (BID) | ORAL | Status: DC
Start: 2015-07-24 — End: 2015-07-27
  Administered 2015-07-24 – 2015-07-26 (×6): 12.5 mg via ORAL
  Filled 2015-07-24 (×7): qty 1

## 2015-07-24 MED ORDER — POTASSIUM CHLORIDE CRYS ER 20 MEQ PO TBCR
20.0000 meq | EXTENDED_RELEASE_TABLET | Freq: Once | ORAL | Status: AC
  Administered 2015-07-24: 20 meq via ORAL
  Filled 2015-07-24: qty 1

## 2015-07-24 MED ORDER — ENOXAPARIN SODIUM 40 MG/0.4ML ~~LOC~~ SOLN
40.0000 mg | SUBCUTANEOUS | Status: DC
  Administered 2015-07-24 – 2015-07-25 (×2): 40 mg via SUBCUTANEOUS
  Filled 2015-07-24 (×4): qty 0.4

## 2015-07-24 MED ORDER — IPRATROPIUM BROMIDE 0.02 % IN SOLN
0.5000 mg | Freq: Four times a day (QID) | RESPIRATORY_TRACT | Status: DC | PRN
  Administered 2015-07-24: 0.5 mg via RESPIRATORY_TRACT
  Filled 2015-07-24: qty 2.5

## 2015-07-24 MED ORDER — POTASSIUM CHLORIDE 20 MEQ PO PACK
20.0000 meq | PACK | Freq: Once | ORAL | Status: DC
  Filled 2015-07-24: qty 1

## 2015-07-24 MED ORDER — ASPIRIN 325 MG PO TABS
325.0000 mg | ORAL_TABLET | Freq: Every day | ORAL | Status: DC
  Administered 2015-07-24 – 2015-07-25 (×2): 325 mg via ORAL
  Filled 2015-07-24 (×3): qty 1

## 2015-07-24 MED ORDER — DILTIAZEM HCL 30 MG PO TABS
30.0000 mg | ORAL_TABLET | Freq: Four times a day (QID) | ORAL | Status: DC
  Administered 2015-07-24 – 2015-07-25 (×6): 30 mg via ORAL
  Filled 2015-07-24 (×6): qty 1

## 2015-07-24 MED ORDER — LEVALBUTEROL HCL 1.25 MG/0.5ML IN NEBU
1.2500 mg | INHALATION_SOLUTION | Freq: Four times a day (QID) | RESPIRATORY_TRACT | Status: DC | PRN
  Administered 2015-07-24: 1.25 mg via RESPIRATORY_TRACT
  Filled 2015-07-24 (×2): qty 0.5

## 2015-07-24 NOTE — Progress Notes (Signed)
  Echocardiogram 2D Echocardiogram has been performed.  Kevin [name] 07/24/2015, 11:39 AM

## 2015-07-24 NOTE — Progress Notes (Addendum)
[facility] PROGRESS NOTE  Kevin [name] ZOX:[id number] DOB: 1956/06/28 DOA: 07/23/2015 PCP: No primary care provider on file.  Assessment/Plan: 1. Afib RVR -started on Cardizem gtt, HR controlled now -start PO Cardizem and wean gtt -his CHADSvasc score is 0, unless he has CHF on ECHO, hence will use ASA, unless ECHO abnormal -FU ECHO, TSH  2. Elevated troponin -could be due to Afib RVR -flat, non ACS pattern -ASA, low dose BB, FU ECHO-if abnormal will need cards Workup  3. COPD/Asthma -stable, no wheezing, nebs PRN  4. Lung nodule -needs FU  Code Status: Full Code Family Communication: none at bedside Disposition Plan: Tx to tele when off cardizem gtt   HPI/Subjective: Feels ok, breathing better  Objective: Filed Vitals:   07/24/15 0600 07/24/15 0828  BP: 102/80   Pulse: 78   Temp:  97.5 F (36.4 C)  Resp: 11     Intake/Output Summary (Last 24 hours) at 07/24/15 0907 Last data filed at 07/24/15 0600  Gross per 24 hour  Intake 350.83 ml  Output    550 ml  Net -199.17 ml   Filed Weights   07/23/15 2320  Weight: 98.8 kg (217 lb 13 oz)    Exam:   General:  AAOx3  Cardiovascular: S1S2/RRR  Respiratory: CTAB  Abdomen: soft, NT, BS present  Musculoskeletal: no edema c/c   Data Reviewed: Basic Metabolic Panel:  Recent Labs Lab 07/23/15 1442 07/23/15 1522 07/24/15 0340 07/24/15 0345  NA 141  --   --  141  K 3.8  --   --  4.0  CL 107  --   --  107  CO2 26  --   --  28  GLUCOSE 83  --   --  96  BUN 11  --   --  12  CREATININE 0.95  --   --  0.98  CALCIUM 9.3  --   --  8.9  MG  --  1.7 2.3  --   PHOS  --   --  4.1  --    Liver Function Tests:  Recent Labs Lab 07/24/15 0345  AST 32  ALT 27  ALKPHOS 89  BILITOT 1.4*  PROT 6.5  ALBUMIN 3.4*   No results for input(s): LIPASE, AMYLASE in the last 168 hours. No results for input(s): AMMONIA in the last 168 hours. CBC:  Recent Labs Lab 07/23/15 1442 07/24/15 0345  WBC 8.8  6.1  HGB 13.1 11.6*  HCT 40.3 35.8*  MCV 81.3 80.4  PLT 224 198   Cardiac Enzymes:  Recent Labs Lab 07/23/15 1522 07/23/15 2350 07/24/15 0340  TROPONINI 0.07* 0.08* 0.10*   BNP (last 3 results)  Recent Labs  07/23/15 1522  BNP 692.4*    ProBNP (last 3 results) No results for input(s): PROBNP in the last 8760 hours.  CBG: No results for input(s): GLUCAP in the last 168 hours.  Recent Results (from the past 240 hour(s))  MRSA PCR Screening     Status: None   Collection Time: 07/23/15 11:37 PM  Result Value Ref Range Status   MRSA by PCR NEGATIVE NEGATIVE Final    Comment:        The GeneXpert MRSA Assay (FDA approved for NASAL specimens only), is one component of a comprehensive MRSA colonization surveillance program. It is not intended to diagnose MRSA infection nor to guide or monitor treatment for MRSA infections.      Studies: Dg Chest 2 View  07/23/2015  CLINICAL DATA:  Shortness of breath for 5 days, history asthma, former smoker EXAM: CHEST  2 VIEW COMPARISON:  None FINDINGS: Enlargement of cardiac silhouette with pulmonary vascular congestion. Elongation of thoracic aorta. Lungs minimally hyperinflated but grossly clear. No definite infiltrate, pleural effusion or pneumothorax. Bones unremarkable. IMPRESSION: Enlargement of cardiac silhouette with pulmonary vascular congestion. No definite acute infiltrate. Electronically Signed   By: Ulyses Southward M.D.   On: 07/23/2015 14:19   Ct Angio Chest Pe W/cm &/or Wo Cm  07/23/2015  CLINICAL DATA:  Shortness of breath. EXAM: CT ANGIOGRAPHY CHEST WITH CONTRAST TECHNIQUE: Multidetector CT imaging of the chest was performed using the standard protocol during bolus administration of intravenous contrast. Multiplanar CT image reconstructions and MIPs were obtained to evaluate the vascular anatomy. CONTRAST:  56mL OMNIPAQUE IOHEXOL 350 MG/ML SOLN COMPARISON:  Radiograph of same day. FINDINGS: No pneumothorax is noted. Mild  right pleural effusion is noted. 9 mm subpleural nodule is noted in right lateral costophrenic sulcus. Minimal right posterior basilar subsegmental atelectasis is noted. Minimal opacity is noted anteriorly in the right upper lobe which may represent scarring or acute inflammation. Mild cardiomegaly is noted. There is no definite evidence of pulmonary embolus. Mildly enlarged mediastinal lymph nodes are noted which most likely are inflammatory or reactive in etiology. Visualized portion of upper abdomen is unremarkable. No significant osseous abnormality is noted. Review of the MIP images confirms the above findings. IMPRESSION: No evidence of pulmonary embolus. Mild right pleural effusion is noted with minimal right posterior basilar subsegmental atelectasis. Minimal opacity is noted anteriorly in the right upper lobe which may represent scarring or acute inflammation. 9 mm nodule is noted in right lateral costophrenic sulcus. Followup unenhanced chest CT in 3 months is recommended to ensure stability. Electronically Signed   By: Lupita Raider, M.D.   On: 07/23/2015 17:42    Scheduled Meds: . aspirin  325 mg Oral Daily  . diltiazem (CARDIZEM) infusion      . diltiazem  30 mg Oral 4 times per day  . enoxaparin (LOVENOX) injection  40 mg Subcutaneous Q24H  . pantoprazole  40 mg Oral Daily  . sodium chloride  3 mL Intravenous Q12H   Continuous Infusions: . diltiazem (CARDIZEM) infusion 5 mg/hr (07/24/15 0636)   Antibiotics Given (last 72 hours)    None      Principal Problem:   Atrial fibrillation with RVR (HCC) Active Problems:   COPD (chronic obstructive pulmonary disease) (HCC)   Elevated troponin   Elevated brain natriuretic peptide (BNP) level    Time spent:    [facility] [place]  [facility] Pager [phone]. If 7PM-7AM, please contact night-coverage at [email or web address], password [facility] 07/24/2015, 9:07 AM  LOS: 1 day

## 2015-07-24 NOTE — Care Management Note (Signed)
Case Management Note  Patient Details  Name: Kevin [name] MRN: [id number] Date of Birth: Sep 02, 1955  Subjective/Objective:           A.fibwith rvr         Action/Plan:Date: July 24, 2015 Chart reviewed for concurrent status and case management needs. Will continue to follow patient for changes and needs: Marcelle Smiling, RN, BSN, [place]   [phone]   Expected Discharge Date:   (unknown)               Expected Discharge Plan:  Home/Self Care  In-House Referral:  NA  Discharge planning Services  CM Consult  Post Acute Care Choice:  NA Choice offered to:  NA  DME Arranged:    DME Agency:     HH Arranged:    HH Agency:     Status of Service:  In process, will continue to follow  Medicare Important Message Given:    Date Medicare IM Given:    Medicare IM give by:    Date Additional Medicare IM Given:    Additional Medicare Important Message give by:     If discussed at Long Length of Stay Meetings, dates discussed:    Additional Comments:  Golda Acre, RN 07/24/2015, 10:10 AM

## 2015-07-25 ENCOUNTER — Encounter (HOSPITAL_COMMUNITY): Payer: Self-pay | Admitting: Student

## 2015-07-25 DIAGNOSIS — R7989 Other specified abnormal findings of blood chemistry: Secondary | ICD-10-CM

## 2015-07-25 DIAGNOSIS — I4891 Unspecified atrial fibrillation: Principal | ICD-10-CM

## 2015-07-25 DIAGNOSIS — R799 Abnormal finding of blood chemistry, unspecified: Secondary | ICD-10-CM

## 2015-07-25 MED ORDER — VITAMINS A & D EX OINT
TOPICAL_OINTMENT | CUTANEOUS | Status: AC
  Filled 2015-07-25: qty 5

## 2015-07-25 NOTE — Progress Notes (Signed)
Kevin [name] ZOX:[id number] DOB: 05/23/1956 DOA: 07/23/2015 PCP: No primary care provider on file.  Brief narrative: 59 y/o ? Asthma/COPD Adhesive capsulitis R shoulder  Admitted 12/28 c flare of underlying respiratory disease and found to be in Afib c RVR Reports that he was taking relatively large doses of estrogen/pseudoephedrine for asthmatic symptoms Quit smoking about 2 years ago     Past medical history-As per Problem list Chart reviewed as below-   Consultants:  Cardiology  Procedures:  ECho EF25-30%  Antibiotics:  none   Subjective   Pleasant alert oriented denies chest pain Denies overt shortness of breath Denies pain in the arm Denies nausea vomiting Denies any abdominal pain Denies any diarrhea    Objective    Interim History:   Telemetry: Sinus rhythm with you 0.9 second pauses   Objective: Filed Vitals:   07/25/15 0000 07/25/15 0005 07/25/15 0319 07/25/15 0400  BP: 112/67   106/71  Pulse: 82   81  Temp:  97.9 F (36.6 C) 98 F (36.7 C)   TempSrc:  Oral Oral   Resp: 15   11  Height:      Weight:      SpO2: 94%   94%    Intake/Output Summary (Last 24 hours) at 07/25/15 0721 Last data filed at 07/25/15 0320  Gross per 24 hour  Intake  122.5 ml  Output   1175 ml  Net -1052.5 ml    Exam:  EOMI NCAT S1-S2 no murmur rub or gallop Chest clinically clear Abdomen soft nontender nondistended No lower extremity edema No rales no rhonchi  Data Reviewed: Basic Metabolic Panel:  Recent Labs Lab 07/23/15 1442 07/23/15 1522 07/24/15 0340 07/24/15 0345  NA 141  --   --  141  K 3.8  --   --  4.0  CL 107  --   --  107  CO2 26  --   --  28  GLUCOSE 83  --   --  96  BUN 11  --   --  12  CREATININE 0.95  --   --  0.98  CALCIUM 9.3  --   --  8.9  MG  --  1.7 2.3  --   PHOS  --   --  4.1  --    Liver Function Tests:  Recent Labs Lab 07/24/15 0345  AST 32  ALT 27  ALKPHOS 89  BILITOT 1.4*  PROT 6.5  ALBUMIN 3.4*    No results for input(s): LIPASE, AMYLASE in the last 168 hours. No results for input(s): AMMONIA in the last 168 hours. CBC:  Recent Labs Lab 07/23/15 1442 07/24/15 0345  WBC 8.8 6.1  HGB 13.1 11.6*  HCT 40.3 35.8*  MCV 81.3 80.4  PLT 224 198   Cardiac Enzymes:  Recent Labs Lab 07/23/15 1522 07/23/15 2350 07/24/15 0340 07/24/15 1118  TROPONINI 0.07* 0.08* 0.10* 0.10*   BNP: Invalid input(s): POCBNP CBG: No results for input(s): GLUCAP in the last 168 hours.  Recent Results (from the past 240 hour(s))  MRSA PCR Screening     Status: None   Collection Time: 07/23/15 11:37 PM  Result Value Ref Range Status   MRSA by PCR NEGATIVE NEGATIVE Final    Comment:        The GeneXpert MRSA Assay (FDA approved for NASAL specimens only), is one component of a comprehensive MRSA colonization surveillance program. It is not intended to diagnose MRSA infection nor to guide or monitor treatment for  MRSA infections.      Studies:              All Imaging reviewed and is as per above notation   Scheduled Meds: . aspirin  325 mg Oral Daily  . diltiazem  30 mg Oral 4 times per day  . enoxaparin (LOVENOX) injection  40 mg Subcutaneous Q24H  . metoprolol tartrate  12.5 mg Oral BID  . pantoprazole  40 mg Oral Daily  . sodium chloride  3 mL Intravenous Q12H   Continuous Infusions: . diltiazem (CARDIZEM) infusion Stopped (07/24/15 1330)     Assessment/Plan:   1. New onset atrial fibrillation [place] 2 score 2-continue Cardizem 30 mg 4 times a day, metoprolol 12.5 twice a day. Could be potentiated by pseudoephedrine? Consolidate diltiazem as per cardiology potentially 180 mg CD. Versus consideration of transitioning to beta blocker. Continue aspirin 325 daily may benefit from novel oral anticoagulant. TSH 0.8, free T4 1 0.06. Keep mag about 2 possible 4 and recheck in a.m. 2. Acute systolic heart failure without decompensation-unclear etiology. Cardiology consulted as may need  cardiac cath and further workup. At this time patient is not having any chest pain. Troponin trend is flat 3. Asthma/COPD-unclear which. Further workup needed 4. Lung nodule-follow-up as an outpatient  Okay to transfer to telemetry No family at bedside Inpatient pending resolution versus workup   Pleas Koch, MD  [facility] Pager [phone] 07/25/2015, 7:21 AM    LOS: 2 days    \

## 2015-07-25 NOTE — Consult Note (Signed)
CARDIOLOGY CONSULT NOTE   Patient ID: Kevin [name] MRN: [id number], DOB/AGE: [birth year]   Admit date: 07/23/2015 Date of Consult: 07/25/2015 Reason for Consult: Atrial Fibrillation, Decreased EF   Primary Physician: No primary care provider on file. Primary Cardiologist: New  HPI: Kevin [name] is a 59 y.o. male with past medical history of asthma/COPD and past tobacco and substance  Abuse (quit 2 years ago) who presented to [facility] ED on 07/23/2015 for shortness of breath with exertion for the past five days, he equated to being out of his asthmatic medications for the past 8 months. Found to be in new-onset atrial fibrillation with RVR.  While in the ED, BNP was found to be elevated to 692. Troponin values were 0.07, 0.08, and 0.10. TSH normal. CXR showing enlargement of cardiac silhouette with pulmonary vascular congestion. CTA showed no PE but a 9mm nodule was noted in the right costophrenic sulcus with follow-up CT recommended in 3 months.  In regards to his atrial fibrillation, he was started on a Cardizem drip and this was converted to PO throughout his admission. He is now in NSR and on Cardizem  QID and Metoprolol 12.5mg  BID.   He had an echocardiogram on 07/24/2015 which showed severe inferior and inferolateral wall hypokinesis and an EF of 25-30%. Moderate to severe MR was also noted, but thought may be worse due to likely ischemia.  The patient reports he has never seen a Cardiologist before. Reports always being "healthy" except for his asthma. He ran out of his Albuterol 8 months ago and says he experiences dyspnea at rest. Over the past 5 weeks, he has noticed increased dyspnea with exertion when walking to the store. He denies any chest pain, radiating pain, nausea, vomiting, orthopnea, PND, or lower extremity edema. Denies any history of HTN, HLD, or DM. Reports smoking 0.5ppd for 40 years but quit two years ago. Also used Cocaine for 20+ years but quit two years ago.  Reports consuming 2-3 alcoholic drinks per week but denies any current tobacco or drug use.   Problem List Past Medical History  Diagnosis Date  . Asthma     History reviewed. No pertinent past surgical history.   Allergies No Known Allergies    Inpatient Medications . aspirin  325 mg Oral Daily  . diltiazem  30 mg Oral 4 times per day  . enoxaparin (LOVENOX) injection  40 mg Subcutaneous Q24H  . metoprolol tartrate  12.5 mg Oral BID  . pantoprazole  40 mg Oral Daily  . sodium chloride  3 mL Intravenous Q12H    Family History Family History  Problem Relation Age of Onset  . Hypertension Father    Not aware of any family history of CAD.  Social History Social History   Social History  . Marital Status: Single    Spouse Name: N/A  . Number of Children: N/A  . Years of Education: N/A   Occupational History  . Not on file.   Social History Main Topics  . Smoking status: Former Smoker -- 0.50 packs/day for 40 years    Types: Cigarettes  . Smokeless tobacco: Not on file     Comment: Quit in 2014  . Alcohol Use: 0.0 oz/week    0 Standard drinks or equivalent per week     Comment: 2-3 drinks per week.  . Drug Use: Yes     Comment: Not currently. Quit in 2014. 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  . Sexual Activity: Not on file  Other Topics Concern  . Not on file   Social History Narrative     Review of Systems General:  No chills, fever, night sweats or weight changes.  Cardiovascular:  No chest pain,  edema, orthopnea, palpitations, paroxysmal nocturnal dyspnea. Positive for dyspnea on exertion. Dermatological: No rash, lesions/masses Respiratory: No cough, dyspnea Urologic: No hematuria, dysuria Abdominal:   No nausea, vomiting, diarrhea, bright red blood per rectum, melena, or hematemesis Neurologic:  No visual changes, wkns, changes in mental status. All other systems reviewed and are otherwise negative except as noted above.  Physical Exam  Blood  pressure 120/78, pulse 85, temperature 97.9 F (36.6 C), temperature source Oral, resp. rate 19, height 6' 1.5" (1.867 m), weight 217 lb 13 oz (98.8 kg), SpO2 96 %.  General: Pleasant, NAD Psych: Normal affect. Neuro: Alert and oriented X 3. Moves all extremities spontaneously. HEENT: Normal  Neck: Supple without bruits or JVD. Lungs:  Resp regular and unlabored, CTA without wheezing or rales. Heart: RRR no s3, s4, or murmurs. Abdomen: Soft, non-tender, non-distended, BS + x 4.  Extremities: No clubbing, cyanosis or edema. DP/PT/Radials 2+ and equal bilaterally.  Labs   Recent Labs  07/23/15 1522 07/23/15 2350 07/24/15 0340 07/24/15 1118  TROPONINI 0.07* 0.08* 0.10* 0.10*   Lab Results  Component Value Date   WBC 6.1 07/24/2015   HGB 11.6* 07/24/2015   HCT 35.8* 07/24/2015   MCV 80.4 07/24/2015   PLT 198 07/24/2015     Recent Labs Lab 07/24/15 0345  NA 141  K 4.0  CL 107  CO2 28  BUN 12  CREATININE 0.98  CALCIUM 8.9  PROT 6.5  BILITOT 1.4*  ALKPHOS 89  ALT 27  AST 32  GLUCOSE 96    Radiology/Studies  Dg Chest 2 View: 07/23/2015  CLINICAL DATA:  Shortness of breath for 5 days, history asthma, former smoker EXAM: CHEST  2 VIEW COMPARISON:  None FINDINGS: Enlargement of cardiac silhouette with pulmonary vascular congestion. Elongation of thoracic aorta. Lungs minimally hyperinflated but grossly clear. No definite infiltrate, pleural effusion or pneumothorax. Bones unremarkable. IMPRESSION: Enlargement of cardiac silhouette with pulmonary vascular congestion. No definite acute infiltrate. Electronically Signed   By: Ulyses Southward M.D.   On: 07/23/2015 14:19   Ct Angio Chest Pe W/cm &/or Wo Cm: 07/23/2015  CLINICAL DATA:  Shortness of breath. EXAM: CT ANGIOGRAPHY CHEST WITH CONTRAST TECHNIQUE: Multidetector CT imaging of the chest was performed using the standard protocol during bolus administration of intravenous contrast. Multiplanar CT image reconstructions and MIPs  were obtained to evaluate the vascular anatomy. CONTRAST:  78mL OMNIPAQUE IOHEXOL 350 MG/ML SOLN COMPARISON:  Radiograph of same day. FINDINGS: No pneumothorax is noted. Mild right pleural effusion is noted. 9 mm subpleural nodule is noted in right lateral costophrenic sulcus. Minimal right posterior basilar subsegmental atelectasis is noted. Minimal opacity is noted anteriorly in the right upper lobe which may represent scarring or acute inflammation. Mild cardiomegaly is noted. There is no definite evidence of pulmonary embolus. Mildly enlarged mediastinal lymph nodes are noted which most likely are inflammatory or reactive in etiology. Visualized portion of upper abdomen is unremarkable. No significant osseous abnormality is noted. Review of the MIP images confirms the above findings. IMPRESSION: No evidence of pulmonary embolus. Mild right pleural effusion is noted with minimal right posterior basilar subsegmental atelectasis. Minimal opacity is noted anteriorly in the right upper lobe which may represent scarring or acute inflammation. 9 mm nodule is noted in right lateral costophrenic sulcus.  Followup unenhanced chest CT in 3 months is recommended to ensure stability. Electronically Signed   By: Lupita Raider, M.D.   On: 07/23/2015 17:42    ECG: Atrial fibrillation with rate in 110's. No acute ischemic changes.   ECHOCARDIOGRAM: 07/24/2015 Study Conclusions - Left ventricle: Severe inferior and inferolateral wall hypokinesis The cavity size was moderately dilated. Wall thickness was normal. Systolic function was severely reduced. The estimated ejection fraction was in the range of 25% to 30%. - Mitral valve: Moderate to severe MR may be worse Likely ischemic given low EF and RWMAls Consider TEE if clinically indicated. - Left atrium: The atrium was moderately dilated. - Atrial septum: No defect or patent foramen ovale was identified. - Pulmonary arteries: PA peak pressure: 37 mm Hg  (S).  ASSESSMENT AND PLAN  1. Acute Systolic CHF - BNP 692 on admission, CXR showing enlargement of cardiac silhouette with pulmonary vascular congestion. - reports having dyspnea with exertion for the past 5 weeks - Echo on 07/24/2015 showed severe inferior and inferolateral wall hypokinesis and an EF of 25-30%. Moderate to severe MR was also noted, but thought may be worse due to likely ischemia. - reports no history of HTN, HLD, or DM but does have a 20+ pack year history and 20+ year history of Cocaine use. Quit using both substances in 2014. - will check Lipid Panel and A1c for risk stratification - will likely need LHC for further evaluation of his newly found decreased EF this admission. The patient is very antsy to go home but was provided education on his current condition and why he would need further testing.  2. New Onset Atrial Fibrillation - This patients CHA2DS2-VASc Score and unadjusted Ischemic Stroke Rate (% per year) is equal to 0.6 % stroke rate/year from a score of 1 (CHF). Only taking ASA 325mg  currently.  - Currently in NSR and on Cardizem 30mg  QID and Metoprolol 12.5mg  BID. Will stop Cardizem at this time with decreased EF. Continue BB.  3. COPD - per admitting team  4. Newly Diagnosed Lung Nodule - CTA showed a 32mm nodule in the right costophrenic sulcus with follow-up CT recommended in 3 months.  Signed, Ellsworth Lennox, PA-C 07/25/2015, 12:59 PM Pager: [phone]

## 2015-07-26 ENCOUNTER — Other Ambulatory Visit (HOSPITAL_COMMUNITY): Payer: Self-pay

## 2015-07-26 ENCOUNTER — Encounter (HOSPITAL_COMMUNITY)
Admission: EM | Disposition: A | Discharge status: Home / Self Care | Payer: Self-pay | Source: Home / Self Care | Attending: Family Medicine

## 2015-07-26 DIAGNOSIS — I5021 Acute systolic (congestive) heart failure: Secondary | ICD-10-CM

## 2015-07-26 DIAGNOSIS — I2510 Atherosclerotic heart disease of native coronary artery without angina pectoris: Secondary | ICD-10-CM

## 2015-07-26 DIAGNOSIS — I5022 Chronic systolic (congestive) heart failure: Secondary | ICD-10-CM | POA: Diagnosis present

## 2015-07-26 HISTORY — PX: CARDIAC CATHETERIZATION: SHX172

## 2015-07-26 LAB — CBC
HCT: 38.4 % — ABNORMAL LOW (ref 39.0–52.0)
Hemoglobin: 12.5 g/dL — ABNORMAL LOW (ref 13.0–17.0)
MCH: 26.3 pg (ref 26.0–34.0)
MCHC: 32.6 g/dL (ref 30.0–36.0)
MCV: 80.8 fL (ref 78.0–100.0)
PLATELETS: 199 10*3/uL (ref 150–400)
RBC: 4.75 MIL/uL (ref 4.22–5.81)
RDW: 14.4 % (ref 11.5–15.5)
WBC: 6.9 10*3/uL (ref 4.0–10.5)

## 2015-07-26 LAB — BASIC METABOLIC PANEL
Anion gap: 6 (ref 5–15)
BUN: 19 mg/dL (ref 6–20)
CHLORIDE: 104 mmol/L (ref 101–111)
CO2: 29 mmol/L (ref 22–32)
CREATININE: 1.11 mg/dL (ref 0.61–1.24)
Calcium: 9.1 mg/dL (ref 8.9–10.3)
Glucose, Bld: 94 mg/dL (ref 65–99)
POTASSIUM: 4.4 mmol/L (ref 3.5–5.1)
SODIUM: 139 mmol/L (ref 135–145)

## 2015-07-26 LAB — LIPID PANEL
CHOL/HDL RATIO: 4.4 ratio
CHOLESTEROL: 120 mg/dL (ref 0–200)
HDL: 27 mg/dL — ABNORMAL LOW (ref >40)
LDL Cholesterol: 72 mg/dL (ref 0–99)
Triglycerides: 103 mg/dL (ref <150)
VLDL: 21 mg/dL (ref 0–40)

## 2015-07-26 LAB — CREATININE, SERUM
CREATININE: 1 mg/dL (ref 0.61–1.24)
GFR calc Af Amer: >60 mL/min (ref >60)
GFR calc non Af Amer: >60 mL/min (ref >60)

## 2015-07-26 LAB — MAGNESIUM: MAGNESIUM: 1.8 mg/dL (ref 1.7–2.4)

## 2015-07-26 SURGERY — LEFT HEART CATH AND CORONARY ANGIOGRAPHY
Anesthesia: LOCAL

## 2015-07-26 MED ORDER — MIDAZOLAM HCL 2 MG/2ML IJ SOLN
INTRAMUSCULAR | Status: DC | PRN
  Administered 2015-07-26: 2 mg via INTRAVENOUS

## 2015-07-26 MED ORDER — HEPARIN SODIUM (PORCINE) 5000 UNIT/ML IJ SOLN
5000.0000 [IU] | Freq: Three times a day (TID) | INTRAMUSCULAR | Status: DC
  Administered 2015-07-26 – 2015-07-28 (×5): 5000 [IU] via SUBCUTANEOUS
  Filled 2015-07-26 (×6): qty 1

## 2015-07-26 MED ORDER — ONDANSETRON HCL 4 MG/2ML IJ SOLN: 4.0000 mg | Freq: Four times a day (QID) | INTRAMUSCULAR | Status: DC | PRN

## 2015-07-26 MED ORDER — SODIUM CHLORIDE 0.9 % IV SOLN: INTRAVENOUS | Status: DC

## 2015-07-26 MED ORDER — HEPARIN (PORCINE) IN NACL 2-0.9 UNIT/ML-% IJ SOLN
INTRAMUSCULAR | Status: DC | PRN
  Administered 2015-07-26: 13:00:00 via INTRA_ARTERIAL

## 2015-07-26 MED ORDER — SODIUM CHLORIDE 0.9 % IJ SOLN: 3.0000 mL | Freq: Two times a day (BID) | INTRAMUSCULAR | Status: DC

## 2015-07-26 MED ORDER — SODIUM CHLORIDE 0.9 % IJ SOLN
3.0000 mL | Freq: Two times a day (BID) | INTRAMUSCULAR | Status: DC
  Administered 2015-07-26 (×2): 3 mL via INTRAVENOUS

## 2015-07-26 MED ORDER — LIDOCAINE HCL (PF) 1 % IJ SOLN
INTRAMUSCULAR | Status: AC
  Filled 2015-07-26: qty 30

## 2015-07-26 MED ORDER — IOHEXOL 350 MG/ML SOLN
INTRAVENOUS | Status: DC | PRN
  Administered 2015-07-26: 55 mL via INTRA_ARTERIAL

## 2015-07-26 MED ORDER — SODIUM CHLORIDE 0.9 % IV SOLN: 250.0000 mL | INTRAVENOUS | Status: DC | PRN

## 2015-07-26 MED ORDER — FENTANYL CITRATE (PF) 100 MCG/2ML IJ SOLN
INTRAMUSCULAR | Status: AC
  Filled 2015-07-26: qty 2

## 2015-07-26 MED ORDER — SODIUM CHLORIDE 0.9 % IJ SOLN: 3.0000 mL | INTRAMUSCULAR | Status: DC | PRN

## 2015-07-26 MED ORDER — OFF THE BEAT BOOK
Freq: Once | Status: DC
  Filled 2015-07-26: qty 1

## 2015-07-26 MED ORDER — SODIUM CHLORIDE 0.9 % IV SOLN
INTRAVENOUS | Status: DC
  Administered 2015-07-26: 06:00:00 via INTRAVENOUS

## 2015-07-26 MED ORDER — ASPIRIN 81 MG PO CHEW
81.0000 mg | CHEWABLE_TABLET | ORAL | Status: AC
  Administered 2015-07-26: 81 mg via ORAL
  Filled 2015-07-26: qty 1

## 2015-07-26 MED ORDER — FENTANYL CITRATE (PF) 100 MCG/2ML IJ SOLN
INTRAMUSCULAR | Status: DC | PRN
  Administered 2015-07-26: 25 ug via INTRAVENOUS

## 2015-07-26 MED ORDER — HEPARIN (PORCINE) IN NACL 2-0.9 UNIT/ML-% IJ SOLN
INTRAMUSCULAR | Status: AC
Start: 2015-07-26 — End: 2015-07-26
  Filled 2015-07-26: qty 1000

## 2015-07-26 MED ORDER — ACETAMINOPHEN 325 MG PO TABS: 650.0000 mg | ORAL_TABLET | ORAL | Status: DC | PRN

## 2015-07-26 MED ORDER — HEPARIN SODIUM (PORCINE) 1000 UNIT/ML IJ SOLN
INTRAMUSCULAR | Status: DC | PRN
  Administered 2015-07-26: 5000 [IU] via INTRAVENOUS

## 2015-07-26 MED ORDER — MIDAZOLAM HCL 2 MG/2ML IJ SOLN
INTRAMUSCULAR | Status: AC
  Filled 2015-07-26: qty 2

## 2015-07-26 MED ORDER — VERAPAMIL HCL 2.5 MG/ML IV SOLN
INTRAVENOUS | Status: AC
  Filled 2015-07-26: qty 2

## 2015-07-26 MED ORDER — LIDOCAINE HCL (PF) 1 % IJ SOLN
INTRAMUSCULAR | Status: DC | PRN
  Administered 2015-07-26: 14:00:00

## 2015-07-26 MED ORDER — ASPIRIN 81 MG PO CHEW: 81.0000 mg | CHEWABLE_TABLET | ORAL | Status: DC

## 2015-07-26 MED ORDER — HEPARIN SODIUM (PORCINE) 1000 UNIT/ML IJ SOLN
INTRAMUSCULAR | Status: AC
  Filled 2015-07-26: qty 1

## 2015-07-26 SURGICAL SUPPLY — 11 items
CATH INFINITI 5 FR JL3.5 (CATHETERS) ×2 IMPLANT
CATH INFINITI 5FR ANG PIGTAIL (CATHETERS) ×4 IMPLANT
CATH INFINITI JR4 5F (CATHETERS) ×2 IMPLANT
DEVICE RAD COMP TR BAND LRG (VASCULAR PRODUCTS) ×2 IMPLANT
GLIDESHEATH SLEND SS 6F .021 (SHEATH) ×2 IMPLANT
KIT HEART LEFT (KITS) ×2 IMPLANT
PACK CARDIAC CATHETERIZATION (CUSTOM PROCEDURE TRAY) ×2 IMPLANT
SYR MEDRAD MARK V 150ML (SYRINGE) ×2 IMPLANT
TRANSDUCER W/STOPCOCK (MISCELLANEOUS) ×2 IMPLANT
TUBING CIL FLEX 10 FLL-RA (TUBING) ×2 IMPLANT
WIRE SAFE-T 1.5MM-J .035X260CM (WIRE) ×2 IMPLANT

## 2015-07-26 NOTE — Progress Notes (Signed)
TR BAND REMOVAL  LOCATION:    Radial right  DEFLATED PER PROTOCOL:   yes  TIME BAND OFF / DRESSING APPLIED:    1530----small tegaderm applied  SITE UPON ARRIVAL:    Level   0  SITE AFTER BAND REMOVAL:    Level   0  CIRCULATION SENSATION AND MOVEMENT:    Within Normal Limits :   yes  COMMENTS:   Care Link here to transport patient back to [facility]

## 2015-07-26 NOTE — H&P (View-Only) (Signed)
Patient Name: Kevin [name] Date of Encounter: 07/26/2015  Principal Problem:   Atrial fibrillation with RVR (HCC) Active Problems:   Acute systolic CHF (congestive heart failure), NYHA class 3 (HCC)   COPD (chronic obstructive pulmonary disease) (HCC)   Elevated troponin   Elevated brain natriuretic peptide (BNP) level   Primary Cardiologist: Dr [name]  Patient Profile: 59 yo male w/ hx asthma/COPD, past tobacco and cocaineabuse (quit 2014). Admitted 12/27 for SOB>>dx afib RVR and EF 25-30%. Now in SR. For cath 12/30  SUBJECTIVE: No chest pain or SOB.   OBJECTIVE Filed Vitals:   07/26/15 0000 07/26/15 0008 07/26/15 0400 07/26/15 0600  BP: 102/77  114/91   Pulse: 85     Temp:  98.6 F (37 C) 97.5 F (36.4 C)   TempSrc:  Oral Oral   Resp: 12  18   Height:      Weight:    215 lb 13.3 oz (97.9 kg)  SpO2: 97%  98%     Intake/Output Summary (Last 24 hours) at 07/26/15 0718 Last data filed at 07/26/15 0932  Gross per 24 hour  Intake    740 ml  Output   1425 ml  Net   -685 ml   Filed Weights   07/23/15 2320 07/26/15 0600  Weight: 217 lb 13 oz (98.8 kg) 215 lb 13.3 oz (97.9 kg)    PHYSICAL EXAM General: Well developed, well nourished, male in no acute distress. Head: Normocephalic, atraumatic.  Neck: Supple without bruits, JVD minimal elevation. Lungs:  Resp regular and unlabored, few rales bases, good air exchange. Heart: slightly irregular r&r, S1, S2, no S3, S4, or murmur; no rub. Abdomen: Soft, non-tender, non-distended, BS + x 4.  Extremities: No clubbing, cyanosis, edema.  Neuro: Alert and oriented X 3. Moves all extremities spontaneously. Psych: Normal affect.  LABS: CBC:  Recent Labs  07/23/15 1442 07/24/15 0345  WBC 8.8 6.1  HGB 13.1 11.6*  HCT 40.3 35.8*  MCV 81.3 80.4  PLT 224 198   INR:  Recent Labs  07/23/15 2350  INR 1.09   Basic Metabolic Panel:  Recent Labs  35/57/32 0340 07/24/15 0345 07/26/15 0340  NA  --  141 139  K   --  4.0 4.4  CL  --  107 104  CO2  --  28 29  GLUCOSE  --  96 94  BUN  --  12 19  CREATININE  --  0.98 1.11  CALCIUM  --  8.9 9.1  MG 2.3  --  1.8  PHOS 4.1  --   --    Liver Function Tests:  Recent Labs  07/24/15 0345  AST 32  ALT 27  ALKPHOS 89  BILITOT 1.4*  PROT 6.5  ALBUMIN 3.4*   Cardiac Enzymes:  Recent Labs  07/23/15 2350 07/24/15 0340 07/24/15 1118  TROPONINI 0.08* 0.10* 0.10*    Recent Labs  07/23/15 1854  TROPIPOC 0.04   BNP:  B NATRIURETIC PEPTIDE  Date/Time Value Ref Range Status  07/23/2015 03:22 PM 692.4* 0.0 - 100.0 pg/mL Final   Thyroid Function Tests:  Recent Labs  07/24/15 1118  TSH 0.838   No results found for: CHOL, HDL, LDLCALC, LDLDIRECT, TRIG, CHOLHDL No results found for: KGUR4Y'  TELE:  SR, PACs and PVCs, 7 bt run WCT that is irregular; no afib 24 hours  Radiology/Studies: Dg Chest 2 View 07/23/2015  CLINICAL DATA:  Shortness of breath for 5 days, history asthma, former smoker EXAM:  CHEST  2 VIEW COMPARISON:  None FINDINGS: Enlargement of cardiac silhouette with pulmonary vascular congestion. Elongation of thoracic aorta. Lungs minimally hyperinflated but grossly clear. No definite infiltrate, pleural effusion or pneumothorax. Bones unremarkable. IMPRESSION: Enlargement of cardiac silhouette with pulmonary vascular congestion. No definite acute infiltrate. Electronically Signed   By: Mark  Boles M.D.   On: 07/23/2015 14:19   Ct Angio Chest Pe W/cm &/or Wo Cm 07/23/2015  CLINICAL DATA:  Shortness of breath. EXAM: CT ANGIOGRAPHY CHEST WITH CONTRAST TECHNIQUE: Multidetector CT imaging of the chest was performed using the standard protocol during bolus administration of intravenous contrast. Multiplanar CT image reconstructions and MIPs were obtained to evaluate the vascular anatomy. CONTRAST:  78mL OMNIPAQUE IOHEXOL 350 MG/ML SOLN COMPARISON:  Radiograph of same day. FINDINGS: No pneumothorax is noted. Mild right pleural effusion is  noted. 9 mm subpleural nodule is noted in right lateral costophrenic sulcus. Minimal right posterior basilar subsegmental atelectasis is noted. Minimal opacity is noted anteriorly in the right upper lobe which may represent scarring or acute inflammation. Mild cardiomegaly is noted. There is no definite evidence of pulmonary embolus. Mildly enlarged mediastinal lymph nodes are noted which most likely are inflammatory or reactive in etiology. Visualized portion of upper abdomen is unremarkable. No significant osseous abnormality is noted. Review of the MIP images confirms the above findings. IMPRESSION: No evidence of pulmonary embolus. Mild right pleural effusion is noted with minimal right posterior basilar subsegmental atelectasis. Minimal opacity is noted anteriorly in the right upper lobe which may represent scarring or acute inflammation. 9 mm nodule is noted in right lateral costophrenic sulcus. Followup unenhanced chest CT in 3 months is recommended to ensure stability. Electronically Signed   By: James  Green Jr, M.D.   On: 07/23/2015 17:42     Current Medications:  . aspirin  81 mg Oral Pre-Cath  . aspirin  325 mg Oral Daily  . enoxaparin (LOVENOX) injection  40 mg Subcutaneous Q24H  . metoprolol tartrate  12.5 mg Oral BID  . pantoprazole  40 mg Oral Daily  . sodium chloride  3 mL Intravenous Q12H  . sodium chloride  3 mL Intravenous Q12H   . sodium chloride 50 mL/hr at 07/26/15 0607    ASSESSMENT AND PLAN: 1. Acute Systolic CHF - BNP 692 on admission, CXR w/ enlargement of cardiac silhouette with pulmonary vascular congestion. - hx DOE for the past 5 weeks - Echo on 07/24/2015 showed severe inferior and inferolateral wall hypokinesis and an EF of 25-30%. Moderate to severe MR was also noted, but thought may be worse due to likely ischemia. - reports no history of HTN, HLD, or DM but does have a 20+ pack year history and 20+ year history of Cocaine use. Quit using both substances in  2014. - Lipid Panel and A1c pending  -  LHC planned 12/30, pt aware and agreeable - pt w/ no insurance for meds, needs inexpensive rx  2. New Onset Atrial Fibrillation - This patients CHA2DS2-VASc Score and unadjusted Ischemic Stroke Rate (% per year) is equal to 0.6 % stroke rate/year from a score of 1 (CHF). Only taking ASA 325mg currently.  - On Metoprolol 12.5mg BID. Cardizem stopped with decreased EF.    3. COPD - per admitting team  4. Newly Diagnosed Lung Nodule - CTA showed a 9mm nodule in the right costophrenic sulcus with follow-up CT recommended in 3 months.   Principal Problem:   Atrial fibrillation with RVR (HCC) Active Problems:   Acute systolic   CHF (congestive heart failure), NYHA class 3 (HCC)   COPD (chronic obstructive pulmonary disease) (HCC)   Elevated troponin   Elevated brain natriuretic peptide (BNP) level   Signed, Theodore Demark , PA-C 7:18 AM 07/26/2015

## 2015-07-26 NOTE — Progress Notes (Signed)
Sleeping

## 2015-07-26 NOTE — Progress Notes (Signed)
Instructions reviewed w/patient re rt wrist restrictions. Eating [place] sandwich.

## 2015-07-26 NOTE — Progress Notes (Signed)
Patient Name: Kevin [name] Date of Encounter: 07/26/2015  Principal Problem:   Atrial fibrillation with RVR (HCC) Active Problems:   Acute systolic CHF (congestive heart failure), NYHA class 3 (HCC)   COPD (chronic obstructive pulmonary disease) (HCC)   Elevated troponin   Elevated brain natriuretic peptide (BNP) level   Primary Cardiologist: Dr [name]  Patient Profile: 59 yo male w/ hx asthma/COPD, past tobacco and cocaineabuse (quit 2014). Admitted 12/27 for SOB>>dx afib RVR and EF 25-30%. Now in SR. For cath 12/30  SUBJECTIVE: No chest pain or SOB.   OBJECTIVE Filed Vitals:   07/26/15 0000 07/26/15 0008 07/26/15 0400 07/26/15 0600  BP: 102/77  114/91   Pulse: 85     Temp:  98.6 F (37 C) 97.5 F (36.4 C)   TempSrc:  Oral Oral   Resp: 12  18   Height:      Weight:    215 lb 13.3 oz (97.9 kg)  SpO2: 97%  98%     Intake/Output Summary (Last 24 hours) at 07/26/15 0718 Last data filed at 07/26/15 0932  Gross per 24 hour  Intake    740 ml  Output   1425 ml  Net   -685 ml   Filed Weights   07/23/15 2320 07/26/15 0600  Weight: 217 lb 13 oz (98.8 kg) 215 lb 13.3 oz (97.9 kg)    PHYSICAL EXAM General: Well developed, well nourished, male in no acute distress. Head: Normocephalic, atraumatic.  Neck: Supple without bruits, JVD minimal elevation. Lungs:  Resp regular and unlabored, few rales bases, good air exchange. Heart: slightly irregular r&r, S1, S2, no S3, S4, or murmur; no rub. Abdomen: Soft, non-tender, non-distended, BS + x 4.  Extremities: No clubbing, cyanosis, edema.  Neuro: Alert and oriented X 3. Moves all extremities spontaneously. Psych: Normal affect.  LABS: CBC:  Recent Labs  07/23/15 1442 07/24/15 0345  WBC 8.8 6.1  HGB 13.1 11.6*  HCT 40.3 35.8*  MCV 81.3 80.4  PLT 224 198   INR:  Recent Labs  07/23/15 2350  INR 1.09   Basic Metabolic Panel:  Recent Labs  35/57/32 0340 07/24/15 0345 07/26/15 0340  NA  --  141 139  K   --  4.0 4.4  CL  --  107 104  CO2  --  28 29  GLUCOSE  --  96 94  BUN  --  12 19  CREATININE  --  0.98 1.11  CALCIUM  --  8.9 9.1  MG 2.3  --  1.8  PHOS 4.1  --   --    Liver Function Tests:  Recent Labs  07/24/15 0345  AST 32  ALT 27  ALKPHOS 89  BILITOT 1.4*  PROT 6.5  ALBUMIN 3.4*   Cardiac Enzymes:  Recent Labs  07/23/15 2350 07/24/15 0340 07/24/15 1118  TROPONINI 0.08* 0.10* 0.10*    Recent Labs  07/23/15 1854  TROPIPOC 0.04   BNP:  B NATRIURETIC PEPTIDE  Date/Time Value Ref Range Status  07/23/2015 03:22 PM 692.4* 0.0 - 100.0 pg/mL Final   Thyroid Function Tests:  Recent Labs  07/24/15 1118  TSH 0.838   No results found for: CHOL, HDL, LDLCALC, LDLDIRECT, TRIG, CHOLHDL No results found for: KGUR4Y'  TELE:  SR, PACs and PVCs, 7 bt run WCT that is irregular; no afib 24 hours  Radiology/Studies: Dg Chest 2 View 07/23/2015  CLINICAL DATA:  Shortness of breath for 5 days, history asthma, former smoker EXAM:  CHEST  2 VIEW COMPARISON:  None FINDINGS: Enlargement of cardiac silhouette with pulmonary vascular congestion. Elongation of thoracic aorta. Lungs minimally hyperinflated but grossly clear. No definite infiltrate, pleural effusion or pneumothorax. Bones unremarkable. IMPRESSION: Enlargement of cardiac silhouette with pulmonary vascular congestion. No definite acute infiltrate. Electronically Signed   By: Ulyses Southward M.D.   On: 07/23/2015 14:19   Ct Angio Chest Pe W/cm &/or Wo Cm 07/23/2015  CLINICAL DATA:  Shortness of breath. EXAM: CT ANGIOGRAPHY CHEST WITH CONTRAST TECHNIQUE: Multidetector CT imaging of the chest was performed using the standard protocol during bolus administration of intravenous contrast. Multiplanar CT image reconstructions and MIPs were obtained to evaluate the vascular anatomy. CONTRAST:  78mL OMNIPAQUE IOHEXOL 350 MG/ML SOLN COMPARISON:  Radiograph of same day. FINDINGS: No pneumothorax is noted. Mild right pleural effusion is  noted. 9 mm subpleural nodule is noted in right lateral costophrenic sulcus. Minimal right posterior basilar subsegmental atelectasis is noted. Minimal opacity is noted anteriorly in the right upper lobe which may represent scarring or acute inflammation. Mild cardiomegaly is noted. There is no definite evidence of pulmonary embolus. Mildly enlarged mediastinal lymph nodes are noted which most likely are inflammatory or reactive in etiology. Visualized portion of upper abdomen is unremarkable. No significant osseous abnormality is noted. Review of the MIP images confirms the above findings. IMPRESSION: No evidence of pulmonary embolus. Mild right pleural effusion is noted with minimal right posterior basilar subsegmental atelectasis. Minimal opacity is noted anteriorly in the right upper lobe which may represent scarring or acute inflammation. 9 mm nodule is noted in right lateral costophrenic sulcus. Followup unenhanced chest CT in 3 months is recommended to ensure stability. Electronically Signed   By: Lupita Raider, M.D.   On: 07/23/2015 17:42     Current Medications:  . aspirin  81 mg Oral Pre-Cath  . aspirin  325 mg Oral Daily  . enoxaparin (LOVENOX) injection  40 mg Subcutaneous Q24H  . metoprolol tartrate  12.5 mg Oral BID  . pantoprazole  40 mg Oral Daily  . sodium chloride  3 mL Intravenous Q12H  . sodium chloride  3 mL Intravenous Q12H   . sodium chloride 50 mL/hr at 07/26/15 0607    ASSESSMENT AND PLAN: 1. Acute Systolic CHF - BNP 692 on admission, CXR w/ enlargement of cardiac silhouette with pulmonary vascular congestion. - hx DOE for the past 5 weeks - Echo on 07/24/2015 showed severe inferior and inferolateral wall hypokinesis and an EF of 25-30%. Moderate to severe MR was also noted, but thought may be worse due to likely ischemia. - reports no history of HTN, HLD, or DM but does have a 20+ pack year history and 20+ year history of Cocaine use. Quit using both substances in  2014. - Lipid Panel and A1c pending  -  LHC planned 12/30, pt aware and agreeable - pt w/ no insurance for meds, needs inexpensive rx  2. New Onset Atrial Fibrillation - This patients CHA2DS2-VASc Score and unadjusted Ischemic Stroke Rate (% per year) is equal to 0.6 % stroke rate/year from a score of 1 (CHF). Only taking ASA  currently.  - On Metoprolol 12.5mg  BID. Cardizem stopped with decreased EF.    3. COPD - per admitting team  4. Newly Diagnosed Lung Nodule - CTA showed a 9mm nodule in the right costophrenic sulcus with follow-up CT recommended in 3 months.   Principal Problem:   Atrial fibrillation with RVR (HCC) Active Problems:   Acute systolic  CHF (congestive heart failure), NYHA class 3 (HCC)   COPD (chronic obstructive pulmonary disease) (HCC)   Elevated troponin   Elevated brain natriuretic peptide (BNP) level   Signed, Theodore Demark , PA-C 7:18 AM 07/26/2015

## 2015-07-26 NOTE — Progress Notes (Signed)
Kevin [name] DUK:[id number] DOB: 1956-06-24 DOA: 07/23/2015 PCP: No primary care provider on file.  Brief narrative: 59 y/o ? Asthma/COPD Adhesive capsulitis R shoulder  Admitted 12/28 c flare of underlying respiratory disease and found to be in Afib c RVR Reports that he was taking relatively large doses of estrogen/pseudoephedrine for asthmatic symptoms Quit smoking about 2 years ago     Past medical history-As per Problem list Chart reviewed as below-   Consultants:  Cardiology  Procedures:  ECho EF25-30%  Cath 12/30  Antibiotics:  none   Subjective    Well Back from cath NO CP nor other issues Tol diet Asking about home   Objective    Interim History:   Telemetry: Sinus rhythm with you 0.9 second pauses   Objective: Filed Vitals:   07/26/15 1455 07/26/15 1510 07/26/15 1525 07/26/15 1601  BP: 128/73 101/86 113/70 100/84  Pulse: 38 40 35 62  Temp:    98 F (36.7 C)  TempSrc:    Oral  Resp: 13 11 11 16   Height:    6\' 1"  (1.854 m)  Weight:    95.709 kg (211 lb)  SpO2: 100% 97% 99% 98%    Intake/Output Summary (Last 24 hours) at 07/26/15 1805 Last data filed at 07/26/15 1700  Gross per 24 hour  Intake    430 ml  Output    500 ml  Net    -70 ml    Exam:  EOMI NCAT S1-S2 no murmur rub or gallop Chest clinically clear Abdomen soft nontender nondistended No lower extremity edema No rales no rhonchi  Data Reviewed: Basic Metabolic Panel:  Recent Labs Lab 07/23/15 1442 07/23/15 1522 07/24/15 0340 07/24/15 0345 07/26/15 0340 07/26/15 1657  NA 141  --   --  141 139  --   K 3.8  --   --  4.0 4.4  --   CL 107  --   --  107 104  --   CO2 26  --   --  28 29  --   GLUCOSE 83  --   --  96 94  --   BUN 11  --   --  12 19  --   CREATININE 0.95  --   --  0.98 1.11 1.00  CALCIUM 9.3  --   --  8.9 9.1  --   MG  --  1.7 2.3  --  1.8  --   PHOS  --   --  4.1  --   --   --    Liver Function Tests:  Recent Labs Lab 07/24/15 0345    AST 32  ALT 27  ALKPHOS 89  BILITOT 1.4*  PROT 6.5  ALBUMIN 3.4*   No results for input(s): LIPASE, AMYLASE in the last 168 hours. No results for input(s): AMMONIA in the last 168 hours. CBC:  Recent Labs Lab 07/23/15 1442 07/24/15 0345 07/26/15 1657  WBC 8.8 6.1 6.9  HGB 13.1 11.6* 12.5*  HCT 40.3 35.8* 38.4*  MCV 81.3 80.4 80.8  PLT 224 198 199   Cardiac Enzymes:  Recent Labs Lab 07/23/15 1522 07/23/15 2350 07/24/15 0340 07/24/15 1118  TROPONINI 0.07* 0.08* 0.10* 0.10*   BNP: Invalid input(s): POCBNP CBG: No results for input(s): GLUCAP in the last 168 hours.  Recent Results (from the past 240 hour(s))  MRSA PCR Screening     Status: None   Collection Time: 07/23/15 11:37 PM  Result Value Ref Range Status  MRSA by PCR NEGATIVE NEGATIVE Final    Comment:        The GeneXpert MRSA Assay (FDA approved for NASAL specimens only), is one component of a comprehensive MRSA colonization surveillance program. It is not intended to diagnose MRSA infection nor to guide or monitor treatment for MRSA infections.      Studies:              All Imaging reviewed and is as per above notation   Scheduled Meds: . aspirin  325 mg Oral Daily  . heparin  5,000 Units Subcutaneous 3 times per day  . metoprolol tartrate  12.5 mg Oral BID  . off the beat book   Does not apply Once  . pantoprazole  40 mg Oral Daily  . sodium chloride  3 mL Intravenous Q12H  . sodium chloride  3 mL Intravenous Q12H   Continuous Infusions:     Assessment/Plan:   1. New onset atrial fibrillation [place] 2 score 2-y, metoprolol 12.5 twice a day. Could be potentiated by pseudoephedrine? Cardiology changed med from Cardizem--> Metoprolol 12.5 bid only. Continue aspirin 325 daily may benefit from novel oral anticoagulant once cleared for this by Cardiology. TSH 0.8, free T4 1 0.06. Keep mag about 2 possible 4 and recheck in a.m. 2. Acute systolic heart failure without decompensation-unclear  etiology. Cardiology input appreciated.  Cath showed no Target-NICM. At this time patient is not having any chest pain. Troponin trend is flat 3. Asthma/COPD-unclear which. Further workup needed 4. Lung nodule-follow-up as an outpatient  Okay to transfer to telemetry No family at bedside Per cardiology as OP   Pleas Koch, MD  [facility] Pager [phone] 07/26/2015, 6:05 PM    LOS: 3 days    \

## 2015-07-26 NOTE — Interval H&P Note (Signed)
Cath Lab Visit (complete for each Cath Lab visit)  Clinical Evaluation Leading to the Procedure:   ACS: Yes.    Non-ACS:    Anginal Classification: CCS IV  Anti-ischemic medical therapy: Minimal Therapy (1 class of medications)  Non-Invasive Test Results: High-risk stress test findings: cardiac mortality >3%/year  Prior CABG: No previous CABG      History and Physical Interval Note:  07/26/2015 1:13 PM  Kevin [name]  has presented today for surgery, with the diagnosis of acute CHF, Low EF  The various methods of treatment have been discussed with the patient and family. After consideration of risks, benefits and other options for treatment, the patient has consented to  Procedure(s): Left Heart Cath and Coronary Angiography (N/A) as a surgical intervention .  The patient's history has been reviewed, patient examined, no change in status, stable for surgery.  I have reviewed the patient's chart and labs.  Questions were answered to the patient's satisfaction.     Nahal Wanless S.

## 2015-07-26 NOTE — Discharge Instructions (Signed)
Atrial Fibrillation °Atrial fibrillation is a type of irregular or rapid heartbeat (arrhythmia). In atrial fibrillation, the heart quivers continuously in a chaotic pattern. This occurs when parts of the heart receive disorganized signals that make the heart unable to pump blood normally. This can increase the risk for stroke, heart failure, and other heart-related conditions. There are different types of atrial fibrillation, including: °· Paroxysmal atrial fibrillation. This type starts suddenly, and it usually stops on its own shortly after it starts. °· Persistent atrial fibrillation. This type often lasts longer than a week. It may stop on its own or with treatment. °· Long-lasting persistent atrial fibrillation. This type lasts longer than 12 months. °· Permanent atrial fibrillation. This type does not go away. °Talk with your health care provider to learn about the type of atrial fibrillation that you have. °CAUSES °This condition is caused by some heart-related conditions or procedures, including: °· A heart attack. °· Coronary artery disease. °· Heart failure. °· Heart valve conditions. °· High blood pressure. °· Inflammation of the sac that surrounds the heart (pericarditis). °· Heart surgery. °· Certain heart rhythm disorders, such as Wolf-Parkinson-White syndrome. °Other causes include: °· Pneumonia. °· Obstructive sleep apnea. °· Blockage of an artery in the lungs (pulmonary embolism, or PE). °· Lung cancer. °· Chronic lung disease. °· Thyroid problems, especially if the thyroid is overactive (hyperthyroidism). °· Caffeine. °· Excessive alcohol use or illegal drug use. °· Use of some medicines, including certain decongestants and diet pills. °Sometimes, the cause cannot be found. °RISK FACTORS °This condition is more likely to develop in: °· People who are older in age. °· People who smoke. °· People who have diabetes mellitus. °· People who are overweight (obese). °· Athletes who exercise  vigorously. °SYMPTOMS °Symptoms of this condition include: °· A feeling that your heart is beating rapidly or irregularly. °· A feeling of discomfort or pain in your chest. °· Shortness of breath. °· Sudden light-headedness or weakness. °· Getting tired easily during exercise. °In some cases, there are no symptoms. °DIAGNOSIS °Your health care provider may be able to detect atrial fibrillation when taking your pulse. If detected, this condition may be diagnosed with: °· An electrocardiogram (ECG). °· A Holter monitor test that records your heartbeat patterns over a 24-hour period. °· Transthoracic echocardiogram (TTE) to evaluate how blood flows through your heart. °· Transesophageal echocardiogram (TEE) to view more detailed images of your heart. °· A stress test. °· Imaging tests, such as a CT scan or chest X-ray. °· Blood tests. °TREATMENT °The main goals of treatment are to prevent blood clots from forming and to keep your heart beating at a normal rate and rhythm. The type of treatment that you receive depends on many factors, such as your underlying medical conditions and how you feel when you are experiencing atrial fibrillation. °This condition may be treated with: °· Medicine to slow down the heart rate, bring the heart's rhythm back to normal, or prevent clots from forming. °· Electrical cardioversion. This is a procedure that resets your heart's rhythm by delivering a controlled, low-energy shock to the heart through your skin. °· Different types of ablation, such as catheter ablation, catheter ablation with pacemaker, or surgical ablation. These procedures destroy the heart tissues that send abnormal signals. When the pacemaker is used, it is placed under your skin to help your heart beat in a regular rhythm. °HOME CARE INSTRUCTIONS °· Take over-the counter and prescription medicines only as told by your health care provider. °·   If your health care provider prescribed a blood-thinning medicine  (anticoagulant), take it exactly as told. Taking too much blood-thinning medicine can cause bleeding. If you do not take enough blood-thinning medicine, you will not have the protection that you need against stroke and other problems.  Do not use tobacco products, including cigarettes, chewing tobacco, and e-cigarettes. If you need help quitting, ask your health care provider.  If you have obstructive sleep apnea, manage your condition as told by your health care provider.  Do not drink alcohol.  Do not drink beverages that contain caffeine, such as coffee, soda, and tea.  Maintain a healthy weight. Do not use diet pills unless your health care provider approves. Diet pills may make heart problems worse.  Follow diet instructions as told by your health care provider.  Exercise regularly as told by your health care provider.  Keep all follow-up visits as told by your health care provider. This is important. PREVENTION  Avoid drinking beverages that contain caffeine or alcohol.  Avoid certain medicines, especially medicines that are used for breathing problems.  Avoid certain herbs and herbal medicines, such as those that contain ephedra or ginseng.  Do not use illegal drugs, such as cocaine and amphetamines.  Do not smoke.  Manage your high blood pressure. SEEK MEDICAL CARE IF:  You notice a change in the rate, rhythm, or strength of your heartbeat.  You are taking an anticoagulant and you notice increased bruising.  You tire more easily when you exercise or exert yourself. SEEK IMMEDIATE MEDICAL CARE IF:  You have chest pain, abdominal pain, sweating, or weakness.  You feel nauseous.  You notice blood in your vomit, bowel movement, or urine.  You have shortness of breath.  You suddenly have swollen feet and ankles.  You feel dizzy.  You have sudden weakness or numbness of the face, arm, or leg, especially on one side of the body.  You have trouble speaking,  trouble understanding, or both (aphasia).  Your face or your eyelid droops on one side. These symptoms may represent a serious problem that is an emergency. Do not wait to see if the symptoms will go away. Get medical help right away. Call your local emergency services (911 in the [place] Do not drive yourself to the hospital.   This information is not intended to replace advice given to you by your health care provider. Make sure you discuss any questions you have with your health care provider.   Document Released: 07/13/2005 Document Revised: 04/03/2015 Document Reviewed: 11/07/2014 Elsevier Interactive Patient Education 2016 [employer] Radial Site Care Refer to this sheet in the next few weeks. These instructions provide you with information about caring for yourself after your procedure. Your health care provider may also give you more specific instructions. Your treatment has been planned according to current medical practices, but problems sometimes occur. Call your health care provider if you have any problems or questions after your procedure. WHAT TO EXPECT AFTER THE PROCEDURE After your procedure, it is typical to have the following:  Bruising at the radial site that usually fades within 1-2 weeks.  Blood collecting in the tissue (hematoma) that may be painful to the touch. It should usually decrease in size and tenderness within 1-2 weeks. HOME CARE INSTRUCTIONS  Take medicines only as directed by your health care provider.  You may shower 24-48 hours after the procedure or as directed by your health care provider. Remove the bandage (dressing) and gently wash the site  with plain soap and water. Pat the area dry with a clean towel. Do not rub the site, because this may cause bleeding.  Do not take baths, swim, or use a hot tub until your health care provider approves.  Check your insertion site every day for redness, swelling, or drainage.  Do not apply powder or lotion to  the site.  Do not flex or bend the affected arm for 24 hours or as directed by your health care provider.  Do not push or pull heavy objects with the affected arm for 24 hours or as directed by your health care provider.  Do not lift over 10 lb (4.5 kg) for 5 days after your procedure or as directed by your health care provider.  Ask your health care provider when it is okay to:  Return to work or school.  Resume usual physical activities or sports.  Resume sexual activity.  Do not drive home if you are discharged the same day as the procedure. Have someone else drive you.  You may drive 24 hours after the procedure unless otherwise instructed by your health care provider.  Do not operate machinery or power tools for 24 hours after the procedure.  If your procedure was done as an outpatient procedure, which means that you went home the same day as your procedure, a responsible adult should be with you for the first 24 hours after you arrive home.  Keep all follow-up visits as directed by your health care provider. This is important. SEEK MEDICAL CARE IF:  You have a fever.  You have chills.  You have increased bleeding from the radial site. Hold pressure on the site. SEEK IMMEDIATE MEDICAL CARE IF:  You have unusual pain at the radial site.  You have redness, warmth, or swelling at the radial site.  You have drainage (other than a small amount of blood on the dressing) from the radial site.  The radial site is bleeding, and the bleeding does not stop after 30 minutes of holding steady pressure on the site.  Your arm or hand becomes pale, cool, tingly, or numb.   This information is not intended to replace advice given to you by your health care provider. Make sure you discuss any questions you have with your health care provider.   Document Released: 08/15/2010 Document Revised: 08/03/2014 Document Reviewed: 01/29/2014 Elsevier Interactive Patient Education 2016  [employer] Radial Site Care Refer to this sheet in the next few weeks. These instructions provide you with information about caring for yourself after your procedure. Your health care provider may also give you more specific instructions. Your treatment has been planned according to current medical practices, but problems sometimes occur. Call your health care provider if you have any problems or questions after your procedure. WHAT TO EXPECT AFTER THE PROCEDURE After your procedure, it is typical to have the following:  Bruising at the radial site that usually fades within 1-2 weeks.  Blood collecting in the tissue (hematoma) that may be painful to the touch. It should usually decrease in size and tenderness within 1-2 weeks. HOME CARE INSTRUCTIONS  Take medicines only as directed by your health care provider.  You may shower 24-48 hours after the procedure or as directed by your health care provider. Remove the bandage (dressing) and gently wash the site with plain soap and water. Pat the area dry with a clean towel. Do not rub the site, because this may cause bleeding.  Do not take baths,  swim, or use a hot tub until your health care provider approves.  Check your insertion site every day for redness, swelling, or drainage.  Do not apply powder or lotion to the site.  Do not flex or bend the affected arm for 24 hours or as directed by your health care provider.  Do not push or pull heavy objects with the affected arm for 24 hours or as directed by your health care provider.  Do not lift over 10 lb (4.5 kg) for 5 days after your procedure or as directed by your health care provider.  Ask your health care provider when it is okay to:  Return to work or school.  Resume usual physical activities or sports.  Resume sexual activity.  Do not drive home if you are discharged the same day as the procedure. Have someone else drive you.  You may drive 24 hours after the procedure  unless otherwise instructed by your health care provider.  Do not operate machinery or power tools for 24 hours after the procedure.  If your procedure was done as an outpatient procedure, which means that you went home the same day as your procedure, a responsible adult should be with you for the first 24 hours after you arrive home.  Keep all follow-up visits as directed by your health care provider. This is important. SEEK MEDICAL CARE IF:  You have a fever.  You have chills.  You have increased bleeding from the radial site. Hold pressure on the site. SEEK IMMEDIATE MEDICAL CARE IF:  You have unusual pain at the radial site.  You have redness, warmth, or swelling at the radial site.  You have drainage (other than a small amount of blood on the dressing) from the radial site.  The radial site is bleeding, and the bleeding does not stop after 30 minutes of holding steady pressure on the site.  Your arm or hand becomes pale, cool, tingly, or numb.   This information is not intended to replace advice given to you by your health care provider. Make sure you discuss any questions you have with your health care provider.   Document Released: 08/15/2010 Document Revised: 08/03/2014 Document Reviewed: 01/29/2014 Elsevier Interactive Patient Education [employer]

## 2015-07-26 NOTE — Progress Notes (Signed)
Received pt to room via [facility]. Pt awake and alert. Pt denies any chest pain. No bleeding at right radial site. Placed on tele monitor with verification.

## 2015-07-26 NOTE — Progress Notes (Signed)
Watching TV. Monitor shows a fib to SR w/PAC's.

## 2015-07-27 DIAGNOSIS — I429 Cardiomyopathy, unspecified: Secondary | ICD-10-CM

## 2015-07-27 DIAGNOSIS — I480 Paroxysmal atrial fibrillation: Secondary | ICD-10-CM

## 2015-07-27 LAB — CBC
HEMATOCRIT: 39.3 % (ref 39.0–52.0)
HEMOGLOBIN: 12.7 g/dL — AB (ref 13.0–17.0)
MCH: 26.1 pg (ref 26.0–34.0)
MCHC: 32.3 g/dL (ref 30.0–36.0)
MCV: 80.9 fL (ref 78.0–100.0)
Platelets: 180 10*3/uL (ref 150–400)
RBC: 4.86 MIL/uL (ref 4.22–5.81)
RDW: 14.3 % (ref 11.5–15.5)
WBC: 6.3 10*3/uL (ref 4.0–10.5)

## 2015-07-27 LAB — COMPREHENSIVE METABOLIC PANEL
ALBUMIN: 3.4 g/dL — AB (ref 3.5–5.0)
ALK PHOS: 90 U/L (ref 38–126)
ALT: 20 U/L (ref 17–63)
AST: 25 U/L (ref 15–41)
Anion gap: 8 (ref 5–15)
BILIRUBIN TOTAL: 1.2 mg/dL (ref 0.3–1.2)
BUN: 15 mg/dL (ref 6–20)
CALCIUM: 9.1 mg/dL (ref 8.9–10.3)
CO2: 26 mmol/L (ref 22–32)
CREATININE: 1.03 mg/dL (ref 0.61–1.24)
Chloride: 107 mmol/L (ref 101–111)
GFR calc Af Amer: >60 mL/min (ref >60)
GFR calc non Af Amer: >60 mL/min (ref >60)
GLUCOSE: 84 mg/dL (ref 65–99)
Potassium: 4.1 mmol/L (ref 3.5–5.1)
SODIUM: 141 mmol/L (ref 135–145)
Total Protein: 6.7 g/dL (ref 6.5–8.1)

## 2015-07-27 LAB — HEMOGLOBIN A1C
Hgb A1c MFr Bld: 5.8 % — ABNORMAL HIGH (ref 4.8–5.6)
MEAN PLASMA GLUCOSE: 120 mg/dL

## 2015-07-27 MED ORDER — SPIRONOLACTONE 12.5 MG HALF TABLET
12.5000 mg | ORAL_TABLET | Freq: Every day | ORAL | Status: DC
Start: 2015-07-27 — End: 2015-07-28
  Administered 2015-07-27 – 2015-07-28 (×2): 12.5 mg via ORAL
  Filled 2015-07-27 (×2): qty 1

## 2015-07-27 MED ORDER — DIGOXIN 125 MCG PO TABS
0.1250 mg | ORAL_TABLET | Freq: Every day | ORAL | Status: DC
  Administered 2015-07-27 – 2015-07-28 (×2): 0.125 mg via ORAL
  Filled 2015-07-27 (×2): qty 1

## 2015-07-27 MED ORDER — ASPIRIN EC 81 MG PO TBEC
81.0000 mg | DELAYED_RELEASE_TABLET | Freq: Every day | ORAL | Status: DC
  Administered 2015-07-27 – 2015-07-28 (×2): 81 mg via ORAL
  Filled 2015-07-27 (×3): qty 1

## 2015-07-27 MED ORDER — CARVEDILOL 6.25 MG PO TABS
6.2500 mg | ORAL_TABLET | Freq: Two times a day (BID) | ORAL | Status: DC
  Administered 2015-07-27 – 2015-07-28 (×2): 6.25 mg via ORAL
  Filled 2015-07-27 (×2): qty 1

## 2015-07-27 NOTE — Progress Notes (Signed)
Primary cardiologist: Dr. Zoila Shutter  Seen for followup: Cardiomyopathy, PAF/PACs, CAD   Subjective:    Feels better in general, still somewhat short of breath when moving around. No chest pain. Mild sense of palpitations occasionally.  Objective:   Temp:  [98 F (36.7 C)-98.3 F (36.8 C)] 98.1 F (36.7 C) (12/31 0541) Pulse Rate:  [0-126] 65 (12/31 0541) Resp:  [0-189] 189 (12/31 0541) BP: (94-128)/(61-98) 99/61 mmHg (12/31 0541) SpO2:  [0 %-100 %] 99 % (12/31 0541) Weight:  [211 lb (95.709 kg)] 211 lb (95.709 kg) (12/30 1601) Last BM Date: 07/26/15  Filed Weights   07/26/15 0600 07/26/15 1601  Weight: 215 lb 13.3 oz (97.9 kg) 211 lb (95.709 kg)    Intake/Output Summary (Last 24 hours) at 07/27/15 0907 Last data filed at 07/27/15 0600  Gross per 24 hour  Intake    480 ml  Output    830 ml  Net   -350 ml    Telemetry: Sinus rhythm with frequent PACs, bursts of SVT and probably PAF as well.  Exam:  General: Patient appears comfortable at rest.  HEENT: Conjunctiva and lids normal, oropharynx clear.  Lungs: No crackles or labored breathing at rest.  Cardiac: RRR with frequent ectopy, soft S3.  Abdomen: NABS.  Extremities: No pitting edema.  Lab Results:  Basic Metabolic Panel:  Recent Labs Lab 07/23/15 1522 07/24/15 0340 07/24/15 0345 07/26/15 0340 07/26/15 1657 07/27/15 0415  NA  --   --  141 139  --  141  K  --   --  4.0 4.4  --  4.1  CL  --   --  107 104  --  107  CO2  --   --  28 29  --  26  GLUCOSE  --   --  96 94  --  84  BUN  --   --  12 19  --  15  CREATININE  --   --  0.98 1.11 1.00 1.03  CALCIUM  --   --  8.9 9.1  --  9.1  MG 1.7 2.3  --  1.8  --   --     Liver Function Tests:  Recent Labs Lab 07/24/15 0345 07/27/15 0415  AST 32 25  ALT 27 20  ALKPHOS 89 90  BILITOT 1.4* 1.2  PROT 6.5 6.7  ALBUMIN 3.4* 3.4*    CBC:  Recent Labs Lab 07/24/15 0345 07/26/15 1657 07/27/15 0415  WBC 6.1 6.9 6.3  HGB 11.6* 12.5*  12.7*  HCT 35.8* 38.4* 39.3  MCV 80.4 80.8 80.9  PLT 198 199 180    Cardiac Enzymes:  Recent Labs Lab 07/23/15 2350 07/24/15 0340 07/24/15 1118  TROPONINI 0.08* 0.10* 0.10*    Coagulation:  Recent Labs Lab 07/23/15 2350  INR 1.09    Echocardiogram 07/24/2015: Study Conclusions  - Left ventricle: Severe inferior and inferolateral wall hypokinesis The cavity size was moderately dilated. Wall thickness was normal. Systolic function was severely reduced. The estimated ejection fraction was in the range of 25% to 30%. - Mitral valve: Moderate to severe MR may be worse Likely ischemic given low EF and RWMAls Consider TEE if clinically indicated. - Left atrium: The atrium was moderately dilated. - Atrial septum: No defect or patent foramen ovale was identified. - Pulmonary arteries: PA peak pressure: 37 mm Hg (S).   Catheterization 07/26/2015: Dominance: Right   Left Anterior Descending   . Mid LAD lesion, 40% stenosed.  Medications:   Scheduled Medications: . aspirin  325 mg Oral Daily  . heparin  5,000 Units Subcutaneous 3 times per day  . metoprolol tartrate  12.5 mg Oral BID  . off the beat book   Does not apply Once  . pantoprazole  40 mg Oral Daily  . sodium chloride  3 mL Intravenous Q12H      PRN Medications:  sodium chloride, sodium chloride, acetaminophen, ipratropium, levalbuterol, ondansetron **OR** ondansetron (ZOFRAN) IV, sodium chloride, sodium chloride   Assessment:   1. Acute systolic heart failure with findings most consistent with a nonischemic cardiomyopathy, LVEF 25-30%.  2. Moderate to severe mitral regurgitation in the setting of problem #1.  3. 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 No clear evidence of recent ACS. Mild increase in troponin I up to 0.10 be consistent with heart failure.  4. Previous history of tobacco and cocaine abuse, quit in 2014.  5. Lung nodule, 9 mm in  the right lateral costophrenic sulcus by chest CT. This will need follow-up over time.  6. LDL cholesterol 72.   Plan/Discussion:    Reviewed hospital course and discussed the situation with the patient. Recommend changing from Lopressor to Coreg at 6.25 mg twice daily. Add digoxin 0.125 mg daily, and also Aldactone 12.5 mg daily. With low normal blood pressure, will hold off ARB at this point, but this would be next addition if tolerated. Etiology of nonischemic cardiomyopathy is not certain, could have even been contributed to by prior use of cocaine in 2014, but would also consider the possibility of a tachycardia-mediated cardiomyopathy if in fact he has been having frequent PSVT or PAF that has not been recognized up to this point. If this is the case, an antiarrhythmic such as amiodarone may be also considered, but would hold off until reassessed as an outpatient. Would hold off on considering any anticoagulants at this time until adequate follow-up has been established. Continue aspirin. Hopefully with medical therapy he will show some improvement in LVEF and perhaps even degree of mitral regurgitation. Would have him ambulate today in the hall on these new medications, and if stable anticipate discharge home tomorrow. He will need to have close follow-up with Dr. [name] or APP in one week's time.   Jonelle Sidle, M.D., F.A.C.C.

## 2015-07-27 NOTE — Progress Notes (Signed)
Kevin [name] HLK:[id number] DOB: Oct 04, 1955 DOA: 07/23/2015 PCP: No primary care provider on file.  Brief narrative: 59 y/o ? Asthma/COPD Adhesive capsulitis R shoulder  Admitted 12/28 c flare of underlying respiratory disease and found to be in Afib c RVR Reports that he was taking relatively large doses of estrogen/pseudoephedrine for asthmatic symptoms Quit smoking about 2 years ago     Past medical history-As per Problem list Chart reviewed as below-   Consultants:  Cardiology  Procedures:  ECho EF25-30%  Cath 12/30  Antibiotics:  none   Subjective   Doing good Chatting on the phone No chest pain No nausea no vomiting Eating well   Objective    Interim History:   Telemetry: Sinus rhythm wi   Objective: Filed Vitals:   07/26/15 1525 07/26/15 1601 07/26/15 2136 07/27/15 0541  BP: 113/70 100/84 105/92 99/61  Pulse: 35 62 46 65  Temp:  98 F (36.7 C) 98.3 F (36.8 C) 98.1 F (36.7 C)  TempSrc:  Oral Oral Oral  Resp: 11 16 18  189  Height:  6\' 1"  (1.854 m)    Weight:  95.709 kg (211 lb)    SpO2: 99% 98% 99% 99%    Intake/Output Summary (Last 24 hours) at 07/27/15 0730 Last data filed at 07/27/15 0600  Gross per 24 hour  Intake    530 ml  Output    830 ml  Net   -300 ml    Exam:  EOMI NCAT S1-S2 no murmur rub or gallop Chest clinically clear Abdomen soft nontender nondistended No lower extremity edema No rales no rhonchi  Data Reviewed: Basic Metabolic Panel:  Recent Labs Lab 07/23/15 1442 07/23/15 1522 07/24/15 0340 07/24/15 0345 07/26/15 0340 07/26/15 1657 07/27/15 0415  NA 141  --   --  141 139  --  141  K 3.8  --   --  4.0 4.4  --  4.1  CL 107  --   --  107 104  --  107  CO2 26  --   --  28 29  --  26  GLUCOSE 83  --   --  96 94  --  84  BUN 11  --   --  12 19  --  15  CREATININE 0.95  --   --  0.98 1.11 1.00 1.03  CALCIUM 9.3  --   --  8.9 9.1  --  9.1  MG  --  1.7 2.3  --  1.8  --   --   PHOS  --   --  4.1   --   --   --   --    Liver Function Tests:  Recent Labs Lab 07/24/15 0345 07/27/15 0415  AST 32 25  ALT 27 20  ALKPHOS 89 90  BILITOT 1.4* 1.2  PROT 6.5 6.7  ALBUMIN 3.4* 3.4*   No results for input(s): LIPASE, AMYLASE in the last 168 hours. No results for input(s): AMMONIA in the last 168 hours. CBC:  Recent Labs Lab 07/23/15 1442 07/24/15 0345 07/26/15 1657 07/27/15 0415  WBC 8.8 6.1 6.9 6.3  HGB 13.1 11.6* 12.5* 12.7*  HCT 40.3 35.8* 38.4* 39.3  MCV 81.3 80.4 80.8 80.9  PLT 224 198 199 180   Cardiac Enzymes:  Recent Labs Lab 07/23/15 1522 07/23/15 2350 07/24/15 0340 07/24/15 1118  TROPONINI 0.07* 0.08* 0.10* 0.10*   BNP: Invalid input(s): POCBNP CBG: No results for input(s): GLUCAP in the last 168 hours.  Recent  Results (from the past 240 hour(s))  MRSA PCR Screening     Status: None   Collection Time: 07/23/15 11:37 PM  Result Value Ref Range Status   MRSA by PCR NEGATIVE NEGATIVE Final    Comment:        The GeneXpert MRSA Assay (FDA approved for NASAL specimens only), is one component of a comprehensive MRSA colonization surveillance program. It is not intended to diagnose MRSA infection nor to guide or monitor treatment for MRSA infections.      Studies:              All Imaging reviewed and is as per above notation   Scheduled Meds: . aspirin  325 mg Oral Daily  . heparin  5,000 Units Subcutaneous 3 times per day  . metoprolol tartrate  12.5 mg Oral BID  . off the beat book   Does not apply Once  . pantoprazole  40 mg Oral Daily  . sodium chloride  3 mL Intravenous Q12H  . sodium chloride  3 mL Intravenous Q12H   Continuous Infusions:     Assessment/Plan:   1. New onset atrial fibrillation [place] 2 score 2-y, metoprolol 12.5 twice a day. Could be potentiated by pseudoephedrine? Cardiology changed med from Cardizem--> Metoprolol 12.5 bid only-->Coreg 6.25 twice a day Added digoxin 0.125 , Aldactone 12.5 Continue aspirin 325  daily.  Cardiology felt may benefit from novel oral anticoagulant once seen as an outpatient please. TSH 0.8, free T4 1 0.06. Keep mag about 2 possible 4 and recheck in a.m. 2. Acute systolic heart failure without decompensation-unclear etiology. Cardiology input appreciated.  Cath showed no Target-NICM. At this time patient is not having any chest pain. Troponin trend is flat. 3. Asthma/COPD-unclear which. Further workup needed 4. Lung nodule-follow-up as an outpatient  Likely can Discharge home in a.m. No family at bedside    Pleas Koch, MD  [facility] Pager [phone] 07/27/2015, 7:30 AM    LOS: 4 days

## 2015-07-28 ENCOUNTER — Encounter (HOSPITAL_COMMUNITY): Payer: Self-pay | Admitting: Nurse Practitioner

## 2015-07-28 DIAGNOSIS — I480 Paroxysmal atrial fibrillation: Secondary | ICD-10-CM | POA: Insufficient documentation

## 2015-07-28 DIAGNOSIS — I428 Other cardiomyopathies: Secondary | ICD-10-CM | POA: Insufficient documentation

## 2015-07-28 DIAGNOSIS — I2510 Atherosclerotic heart disease of native coronary artery without angina pectoris: Secondary | ICD-10-CM | POA: Insufficient documentation

## 2015-07-28 LAB — BASIC METABOLIC PANEL
Anion gap: 8 (ref 5–15)
BUN: 15 mg/dL (ref 6–20)
CALCIUM: 9.2 mg/dL (ref 8.9–10.3)
CO2: 29 mmol/L (ref 22–32)
CREATININE: 0.99 mg/dL (ref 0.61–1.24)
Chloride: 105 mmol/L (ref 101–111)
GFR calc Af Amer: >60 mL/min (ref >60)
GFR calc non Af Amer: >60 mL/min (ref >60)
GLUCOSE: 88 mg/dL (ref 65–99)
Potassium: 4 mmol/L (ref 3.5–5.1)
Sodium: 142 mmol/L (ref 135–145)

## 2015-07-28 LAB — MAGNESIUM: Magnesium: 1.8 mg/dL (ref 1.7–2.4)

## 2015-07-28 MED ORDER — DIGOXIN 125 MCG PO TABS: 0.1250 mg | ORAL_TABLET | Freq: Every day | ORAL | Status: DC

## 2015-07-28 MED ORDER — PANTOPRAZOLE SODIUM 40 MG PO TBEC: 40.0000 mg | DELAYED_RELEASE_TABLET | Freq: Every day | ORAL | Status: DC

## 2015-07-28 MED ORDER — MAGNESIUM OXIDE 400 (241.3 MG) MG PO TABS: 400.0000 mg | ORAL_TABLET | Freq: Every day | ORAL | Status: DC

## 2015-07-28 MED ORDER — AEROCHAMBER PLUS FLO-VU MEDIUM MISC: 1.0000 | Freq: Once | Status: DC

## 2015-07-28 MED ORDER — CARVEDILOL 6.25 MG PO TABS: 6.2500 mg | ORAL_TABLET | Freq: Two times a day (BID) | ORAL | Status: DC

## 2015-07-28 MED ORDER — ASPIRIN 81 MG PO TBEC: 81.0000 mg | DELAYED_RELEASE_TABLET | Freq: Every day | ORAL | Status: DC

## 2015-07-28 MED ORDER — E-Z SPACER DEVI: Status: DC

## 2015-07-28 MED ORDER — ALBUTEROL SULFATE HFA 108 (90 BASE) MCG/ACT IN AERS
1.0000 | INHALATION_SPRAY | RESPIRATORY_TRACT | Status: DC | PRN
  Administered 2015-07-28: 2 via RESPIRATORY_TRACT
  Filled 2015-07-28: qty 6.7

## 2015-07-28 MED ORDER — AEROCHAMBER PLUS FLO-VU MEDIUM MISC
1.0000 | Freq: Once | Status: AC
  Administered 2015-07-28: 1
  Filled 2015-07-28: qty 1

## 2015-07-28 MED ORDER — MAGNESIUM OXIDE 400 (241.3 MG) MG PO TABS
400.0000 mg | ORAL_TABLET | Freq: Every day | ORAL | Status: DC
  Administered 2015-07-28: 400 mg via ORAL
  Filled 2015-07-28: qty 1

## 2015-07-28 MED ORDER — ALBUTEROL SULFATE HFA 108 (90 BASE) MCG/ACT IN AERS: 1.0000 | INHALATION_SPRAY | RESPIRATORY_TRACT | Status: DC | PRN

## 2015-07-28 MED ORDER — ALBUTEROL SULFATE HFA 108 (90 BASE) MCG/ACT IN AERS: 2.0000 | INHALATION_SPRAY | Freq: Four times a day (QID) | RESPIRATORY_TRACT | Status: DC | PRN

## 2015-07-28 NOTE — Progress Notes (Signed)
Instructed Pt in use of Albuterol MDI. Pt demonstrated use & understanding.

## 2015-07-28 NOTE — Progress Notes (Signed)
Encouraged pt to walk x3 prior to hs and rationale provided but pt has refused.  Pt gait has been steady when going to BR.  BP at HS is 98/74.  Denies any other s/s.  Will attempt to get pt to walk again in morning after VS.

## 2015-07-28 NOTE — Progress Notes (Signed)
Primary cardiologist: Dr. Zoila Shutter  Seen for followup: Cardiomyopathy, PAF/PACs, CAD   Subjective:    No chest pain or palpitations. Breathing better.  Objective:   Temp:  [97.4 F (36.3 C)-98.4 F (36.9 C)] 98.4 F (36.9 C) (01/01 0628) Pulse Rate:  [53-91] 81 (01/01 0628) Resp:  [18] 18 (01/01 0628) BP: (98-134)/(74-96) 129/96 mmHg (01/01 0628) SpO2:  [98 %-100 %] 98 % (01/01 0628) Weight:  [213 lb (96.616 kg)-213 lb 14.4 oz (97.024 kg)] 213 lb (96.616 kg) (01/01 0628) Last BM Date: 07/27/15  Filed Weights   07/26/15 1601 07/27/15 1317 07/28/15 0628  Weight: 211 lb (95.709 kg) 213 lb 14.4 oz (97.024 kg) 213 lb (96.616 kg)    Intake/Output Summary (Last 24 hours) at 07/28/15 0816 Last data filed at 07/28/15 0600  Gross per 24 hour  Intake   1840 ml  Output    250 ml  Net   1590 ml    Telemetry: Sinus rhythm with frequent PACs, bursts of SVT and probably PAF as well.  Exam:  General: Patient appears comfortable at rest.  HEENT: Conjunctiva and lids normal, oropharynx clear.  Lungs: No crackles or labored breathing at rest.  Cardiac: RRR with frequent ectopy, soft S3.  Abdomen: NABS.  Extremities: No pitting edema.  Lab Results:  Basic Metabolic Panel:  Recent Labs Lab 07/24/15 0340  07/26/15 0340 07/26/15 1657 07/27/15 0415 07/28/15 0534  NA  --   < > 139  --  141 142  K  --   < > 4.4  --  4.1 4.0  CL  --   < > 104  --  107 105  CO2  --   < > 29  --  26 29  GLUCOSE  --   < > 94  --  84 88  BUN  --   < > 19  --  15 15  CREATININE  --   < > 1.11 1.00 1.03 0.99  CALCIUM  --   < > 9.1  --  9.1 9.2  MG 2.3  --  1.8  --   --  1.8  < > = values in this interval not displayed.  Liver Function Tests:  Recent Labs Lab 07/24/15 0345 07/27/15 0415  AST 32 25  ALT 27 20  ALKPHOS 89 90  BILITOT 1.4* 1.2  PROT 6.5 6.7  ALBUMIN 3.4* 3.4*    CBC:  Recent Labs Lab 07/24/15 0345 07/26/15 1657 07/27/15 0415  WBC 6.1 6.9 6.3  HGB  11.6* 12.5* 12.7*  HCT 35.8* 38.4* 39.3  MCV 80.4 80.8 80.9  PLT 198 199 180    Cardiac Enzymes:  Recent Labs Lab 07/23/15 2350 07/24/15 0340 07/24/15 1118  TROPONINI 0.08* 0.10* 0.10*    Coagulation:  Recent Labs Lab 07/23/15 2350  INR 1.09    Echocardiogram 07/24/2015: Study Conclusions  - Left ventricle: Severe inferior and inferolateral wall hypokinesis The cavity size was moderately dilated. Wall thickness was normal. Systolic function was severely reduced. The estimated ejection fraction was in the range of 25% to 30%. - Mitral valve: Moderate to severe MR may be worse Likely ischemic given low EF and RWMAls Consider TEE if clinically indicated. - Left atrium: The atrium was moderately dilated. - Atrial septum: No defect or patent foramen ovale was identified. - Pulmonary arteries: PA peak pressure: 37 mm Hg (S).   Catheterization 07/26/2015: Dominance: Right   Left Anterior Descending   . Mid LAD lesion, 40%  stenosed.        Medications:   Scheduled Medications: . AEROCHAMBER PLUS FLO-VU MEDIUM  1 each Other Once  . aspirin EC  81 mg Oral Daily  . carvedilol  6.25 mg Oral BID WC  . digoxin  0.125 mg Oral Daily  . heparin  5,000 Units Subcutaneous 3 times per day  . magnesium oxide  400 mg Oral Daily  . off the beat book   Does not apply Once  . pantoprazole  40 mg Oral Daily  . sodium chloride  3 mL Intravenous Q12H  . spironolactone  12.5 mg Oral Daily     PRN Medications: sodium chloride, sodium chloride, acetaminophen, albuterol, ipratropium, levalbuterol, ondansetron **OR** ondansetron (ZOFRAN) IV, sodium chloride, sodium chloride   Assessment:   1. Acute systolic heart failure with findings most consistent with a nonischemic cardiomyopathy, LVEF 25-30%. Symptomatically improved.  2. Moderate to severe mitral regurgitation in the setting of problem #1.  3. 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 No clear evidence of recent ACS. Mild increase in troponin I up to 0.10 be consistent with heart failure.  4. Previous history of tobacco and cocaine abuse, quit in 2014.  5. Lung nodule, 9 mm in the right lateral costophrenic sulcus by chest CT. This will need follow-up over time.  6. LDL cholesterol 72.   Plan/Discussion:    I discussed with primary team. Plan is discharge home today with close outpatient followup. Continue Coreg, digoxin, and Aldactone at current doses. Etiology of nonischemic cardiomyopathy is not certain, could have even been contributed to by prior use of cocaine in 2014, but would also consider the possibility of a tachycardia-mediated cardiomyopathy if in fact he has been having frequent PSVT or PAF that has not been recognized up to this point. If this is the case, an antiarrhythmic such as amiodarone may be also considered, but would hold off until reassessed as an outpatient. Would hold off on considering any anticoagulants at this time until adequate follow-up has been established. Continue aspirin. Hopefully with medical therapy he will show some improvement in LVEF and perhaps even degree of mitral regurgitation. He will need to have close follow-up with Dr. [name] or APP in one week's time.   Jonelle Sidle, M.D., F.A.C.C.

## 2015-07-28 NOTE — Progress Notes (Signed)
Pt walked hall this morning with a steady gait and denies dizziness.  BP this AM prior to walking was 139/96, HR 81

## 2015-07-28 NOTE — Discharge Summary (Signed)
Physician Discharge Summary  Dayna Geurts ZOX:[id number] DOB: 03/21/1956 DOA: 07/23/2015  PCP: No primary care provider on file.  Admit date: 07/23/2015 Discharge date: 07/28/2015  Time spent: 35 minutes  Recommendations for Outpatient Follow-up:  1. needs follow up at Afib clinic-to be arranged as per Cardiology 2. Needs bmet/Magensium 1 week-to be done at cardiology follow up as no PCP 3. Refilled multiple meds and inhalers as per MAR 4. Consider PFT's 5. Needs low dose screening Lung scan for pulmonary nodules as op as per USPTF  Discharge Diagnoses:  Principal Problem:   Atrial fibrillation with RVR (HCC) Active Problems:   COPD (chronic obstructive pulmonary disease) (HCC)   Elevated troponin   Elevated brain natriuretic peptide (BNP) level   Acute systolic CHF (congestive heart failure), NYHA class 3 (HCC)   Discharge Condition:   Diet recommendation: fair  Filed Weights   07/26/15 1601 07/27/15 1317 07/28/15 0628  Weight: 95.709 kg (211 lb) 97.024 kg (213 lb 14.4 oz) 96.616 kg (213 lb)    History of present illness:  60 y/o ? Asthma/COPD Adhesive capsulitis R shoulder  Admitted 12/28 c flare of underlying respiratory disease and found to be in Afib c RVR Reports that he was taking relatively large doses of estrogen/pseudoephedrine for asthmatic symptoms Quit smoking about 2 years ago  Hospital Course:    New onset atrial fibrillation [place] 2 score 2 ? Cardiology changed med from Cardizem--> Metoprolol 12.5 bid only-->Coreg 6.25 twice a day Added digoxin 0.125 , Aldactone 12.5.  Continue aspirin 325 daily. Cardiology felt may benefit from novel oral anticoagulant once seen as an outpatient please. TSH 0.8, free T4 1 0.06.needs recheck MAg and K level in ~ 1 week.  Magnesium supplements given on d/c home  Acute systolic heart failure without decompensation-unclear etiology-?POTS. Cardiology input appreciated. Cath showed no Target-NICM. At this time patient is not  having any chest pain. Troponin trend is flat.  Asthma/COPD-unclear which. Further workup needed with PFT's as OP  Lung nodule-follow-up as an outpatient  Likely can Discharge home in a.m. No family at bedside  Consultants:  Cardiology  Procedures:  ECho EF25-30%  Cath 12/30  Antibiotics:  none  Discharge Exam: Filed Vitals:   07/27/15 2205 07/28/15 0628  BP: 98/74 129/96  Pulse: 53 81  Temp: 97.5 F (36.4 C) 98.4 F (36.9 C)  Resp: 18 18    General: alert pleasant oriented in nad Cardiovascular: s1 s 2no m/r/g Respiratory: clear no added sound no wheeze  Reasonable night No pain No wheeze ambulatory, no cp  Discharge Instructions   Discharge Instructions    Amb referral to AFIB Clinic    Complete by:  As directed      Diet - low sodium heart healthy    Complete by:  As directed      Discharge instructions    Complete by:  As directed   We have prescribed multiple medications for you.  Please take them as directed We have given you an inhaler for asthma use.  This should be used every 4 hours as needed.  If you experience an increase in symptoms of shortness of breath, fast heart beat that lasts longer than 20 min or chest pain seek medical attention Please follow up at the Atrial fibrillation clinic as directed Lab work is suggested for you in about 1 week at your regular MD office     Increase activity slowly    Complete by:  As directed  Current Discharge Medication List    START taking these medications   Details  albuterol (PROVENTIL HFA;VENTOLIN HFA) 108 (90 Base) MCG/ACT inhaler Inhale 2 puffs into the lungs every 6 (six) hours as needed for wheezing or shortness of breath. Qty: 1 Inhaler, Refills: 2    aspirin EC 81 MG EC tablet Take 1 tablet (81 mg total) by mouth daily. Qty: 30 tablet, Refills: 0    carvedilol (COREG) 6.25 MG tablet Take 1 tablet (6.25 mg total) by mouth 2 (two) times daily with a meal. Qty: 30 tablet,  Refills: 0    digoxin (LANOXIN) 0.125 MG tablet Take 1 tablet (0.125 mg total) by mouth daily. Qty: 30 tablet, Refills: 0    pantoprazole (PROTONIX) 40 MG tablet Take 1 tablet (40 mg total) by mouth daily. Qty: 30 tablet, Refills: 0    Spacer/Aero-Holding Chambers (E-Z SPACER) inhaler Use as instructed Qty: 1 each, Refills: 2      STOP taking these medications     ePHEDrine-GuaiFENesin (BRONKAID) 25-400 MG TABS      ePHEDrine-GuaiFENesin (PRIMATENE ASTHMA PO)        No Known Allergies Follow-up Information    Follow up with Please use the resources provided to you in emergency room by case manager to assist with doctor for follow up . Schedule an appointment as soon as possible for a visit on 07/24/2015.   Why:  A referral for you has been sent to [facility] if you have not received a call in 3 days you may contact them Call Scherry Ran at [phone] Tuesday-Friday [email or web address]   Contact information:   These [place] uninsured resources provide possible primary care providers, resources for discounted medications, housing, dental resources, affordable care act information, plus other resources for [facility]         The results of significant diagnostics from this hospitalization (including imaging, microbiology, ancillary and laboratory) are listed below for reference.    Significant Diagnostic Studies: Dg Chest 2 View  07/23/2015  CLINICAL DATA:  Shortness of breath for 5 days, history asthma, former smoker EXAM: CHEST  2 VIEW COMPARISON:  None FINDINGS: Enlargement of cardiac silhouette with pulmonary vascular congestion. Elongation of thoracic aorta. Lungs minimally hyperinflated but grossly clear. No definite infiltrate, pleural effusion or pneumothorax. Bones unremarkable. IMPRESSION: Enlargement of cardiac silhouette with pulmonary vascular congestion. No definite acute infiltrate. Electronically Signed   By: Ulyses Southward M.D.   On: 07/23/2015 14:19   Ct Angio Chest Pe W/cm &/or Wo Cm  07/23/2015  CLINICAL DATA:  Shortness of breath. EXAM: CT ANGIOGRAPHY CHEST WITH CONTRAST TECHNIQUE: Multidetector CT imaging of the chest was performed using the standard protocol during bolus administration of intravenous contrast. Multiplanar CT image reconstructions and MIPs were obtained to evaluate the vascular anatomy. CONTRAST:  41mL OMNIPAQUE IOHEXOL 350 MG/ML SOLN COMPARISON:  Radiograph of same day. FINDINGS: No pneumothorax is noted. Mild right pleural effusion is noted. 9 mm subpleural nodule is noted in right lateral costophrenic sulcus. Minimal right posterior basilar subsegmental atelectasis is noted. Minimal opacity is noted anteriorly in the right upper lobe which may represent scarring or acute inflammation. Mild cardiomegaly is noted. There is no definite evidence of pulmonary embolus. Mildly enlarged mediastinal lymph nodes are noted which most likely are inflammatory or reactive in etiology. Visualized portion of upper abdomen is unremarkable. No significant osseous abnormality is noted. Review of the MIP images confirms the above findings. IMPRESSION: No evidence of pulmonary embolus.  Mild right pleural effusion is noted with minimal right posterior basilar subsegmental atelectasis. Minimal opacity is noted anteriorly in the right upper lobe which may represent scarring or acute inflammation. 9 mm nodule is noted in right lateral costophrenic sulcus. Followup unenhanced chest CT in 3 months is recommended to ensure stability. Electronically Signed   By: Lupita Raider, M.D.   On: 07/23/2015 17:42    Microbiology: Recent Results (from the past 240 hour(s))  MRSA PCR Screening     Status: None   Collection Time: 07/23/15 11:37 PM  Result Value Ref Range Status   MRSA by PCR NEGATIVE NEGATIVE Final    Comment:        The GeneXpert MRSA Assay (FDA approved for NASAL specimens only), is one component of  a comprehensive MRSA colonization surveillance program. It is not intended to diagnose MRSA infection nor to guide or monitor treatment for MRSA infections.      Labs: Basic Metabolic Panel:  Recent Labs Lab 07/23/15 1442 07/23/15 1522 07/24/15 0340 07/24/15 0345 07/26/15 0340 07/26/15 1657 07/27/15 0415 07/28/15 0534  NA 141  --   --  141 139  --  141 142  K 3.8  --   --  4.0 4.4  --  4.1 4.0  CL 107  --   --  107 104  --  107 105  CO2 26  --   --  28 29  --  26 29  GLUCOSE 83  --   --  96 94  --  84 88  BUN 11  --   --  12 19  --  15 15  CREATININE 0.95  --   --  0.98 1.11 1.00 1.03 0.99  CALCIUM 9.3  --   --  8.9 9.1  --  9.1 9.2  MG  --  1.7 2.3  --  1.8  --   --  1.8  PHOS  --   --  4.1  --   --   --   --   --    Liver Function Tests:  Recent Labs Lab 07/24/15 0345 07/27/15 0415  AST 32 25  ALT 27 20  ALKPHOS 89 90  BILITOT 1.4* 1.2  PROT 6.5 6.7  ALBUMIN 3.4* 3.4*   No results for input(s): LIPASE, AMYLASE in the last 168 hours. No results for input(s): AMMONIA in the last 168 hours. CBC:  Recent Labs Lab 07/23/15 1442 07/24/15 0345 07/26/15 1657 07/27/15 0415  WBC 8.8 6.1 6.9 6.3  HGB 13.1 11.6* 12.5* 12.7*  HCT 40.3 35.8* 38.4* 39.3  MCV 81.3 80.4 80.8 80.9  PLT 224 198 199 180   Cardiac Enzymes:  Recent Labs Lab 07/23/15 1522 07/23/15 2350 07/24/15 0340 07/24/15 1118  TROPONINI 0.07* 0.08* 0.10* 0.10*   BNP: BNP (last 3 results)  Recent Labs  07/23/15 1522  BNP 692.4*    ProBNP (last 3 results) No results for input(s): PROBNP in the last 8760 hours.  CBG: No results for input(s): GLUCAP in the last 168 hours.     SignedRhetta Mura MD   [facility] 07/28/2015, 7:52 AM

## 2015-07-30 ENCOUNTER — Encounter (HOSPITAL_COMMUNITY): Payer: Self-pay | Admitting: Interventional Cardiology

## 2015-08-01 ENCOUNTER — Telehealth: Payer: Self-pay | Admitting: Internal Medicine

## 2015-08-01 NOTE — Telephone Encounter (Signed)
lmtcb

## 2015-08-01 NOTE — Telephone Encounter (Signed)
TOC per Thayer Ohm  08/05/15 @ 830 am w/ Lawson Fiscal @ [facility] [employer]

## 2015-08-02 NOTE — Telephone Encounter (Signed)
Patient contacted regarding discharge from [facility] on 07/28/2015.  Patient understands to follow up with provider Norma Fredrickson on 08/05/2015 at 8:30am at [facility]. Patient understands discharge instructions? yes Patient understands medications and regiment? yes Patient understands to bring all medications to this visit? yes  Appt Monday 08/05/15 - told to call the [facility] office before coming in.  Due to probably weather issues the office will not open until 10am.  Patient voiced understanding.

## 2015-08-05 ENCOUNTER — Encounter: Payer: Self-pay | Admitting: Nurse Practitioner

## 2015-08-20 ENCOUNTER — Ambulatory Visit (HOSPITAL_COMMUNITY)
Admission: RE | Admit: 2015-08-20 | Discharge: 2015-08-20 | Disposition: A | Discharge status: Home / Self Care | Payer: Self-pay | Source: Ambulatory Visit | Attending: Nurse Practitioner | Admitting: Nurse Practitioner

## 2015-08-20 ENCOUNTER — Encounter (HOSPITAL_COMMUNITY): Payer: Self-pay | Admitting: Nurse Practitioner

## 2015-08-20 VITALS — BP 126/78 | HR 94 | Ht 73.0 in | Wt 215.0 lb

## 2015-08-20 DIAGNOSIS — I428 Other cardiomyopathies: Secondary | ICD-10-CM | POA: Insufficient documentation

## 2015-08-20 DIAGNOSIS — J45909 Unspecified asthma, uncomplicated: Secondary | ICD-10-CM | POA: Insufficient documentation

## 2015-08-20 DIAGNOSIS — Z87891 Personal history of nicotine dependence: Secondary | ICD-10-CM | POA: Insufficient documentation

## 2015-08-20 DIAGNOSIS — Z7982 Long term (current) use of aspirin: Secondary | ICD-10-CM | POA: Insufficient documentation

## 2015-08-20 DIAGNOSIS — I480 Paroxysmal atrial fibrillation: Secondary | ICD-10-CM | POA: Insufficient documentation

## 2015-08-20 DIAGNOSIS — Z79899 Other long term (current) drug therapy: Secondary | ICD-10-CM | POA: Insufficient documentation

## 2015-08-20 DIAGNOSIS — I5021 Acute systolic (congestive) heart failure: Secondary | ICD-10-CM | POA: Insufficient documentation

## 2015-08-20 DIAGNOSIS — I340 Nonrheumatic mitral (valve) insufficiency: Secondary | ICD-10-CM | POA: Insufficient documentation

## 2015-08-20 DIAGNOSIS — Z8249 Family history of ischemic heart disease and other diseases of the circulatory system: Secondary | ICD-10-CM | POA: Insufficient documentation

## 2015-08-20 LAB — BASIC METABOLIC PANEL
ANION GAP: 5 (ref 5–15)
BUN: 12 mg/dL (ref 6–20)
CHLORIDE: 109 mmol/L (ref 101–111)
CO2: 30 mmol/L (ref 22–32)
CREATININE: 1.29 mg/dL — AB (ref 0.61–1.24)
Calcium: 9.7 mg/dL (ref 8.9–10.3)
GFR calc non Af Amer: 58 mL/min — ABNORMAL LOW (ref >60)
Glucose, Bld: 101 mg/dL — ABNORMAL HIGH (ref 65–99)
POTASSIUM: 4.6 mmol/L (ref 3.5–5.1)
SODIUM: 144 mmol/L (ref 135–145)

## 2015-08-20 LAB — MAGNESIUM: MAGNESIUM: 1.8 mg/dL (ref 1.7–2.4)

## 2015-08-20 MED ORDER — ALBUTEROL SULFATE HFA 108 (90 BASE) MCG/ACT IN AERS: 2.0000 | INHALATION_SPRAY | Freq: Four times a day (QID) | RESPIRATORY_TRACT | Status: DC | PRN

## 2015-08-20 NOTE — Progress Notes (Signed)
Patient ID: Kevin [name], male   DOB: 11-11-1955, 60 y.o.   MRN: [id number]     Primary Care Physician: No primary care provider on file. Referring Physician: [facility] f/u    Kevin [name] is a 60 y.o. male with a h/o asthma, 20 year h/o cocaine use/tobacco use, no use x 2 years,  presented to [facility] ER with flare of "asthma" and was found to be in afib with rvr, acute systolic heart failure without decompensation. He had an echo which did show reduced LV function, EF 25-30%, moderate to severe  mitral regurgitation, questioned ischemic, and pt  went on to have a cath with revealed 40% mid LAD lesion. He was changed to coreg from lopressor and digoxin added. He was  d/ced in SR, with frequent PAC's. Chads vasc score of 2(cad/lv dysfunction) but anticoagulants were not initiated and d/c on asa.   He denies any irregularity of heart rate since discharge. He believes that overusing primatine tablets(contains ephedrine)  for his respiratory symptoms prior to admission is what drove his issues. He was d/ced on an albuterol inhaler using 2 puffs in the am and believes this helps. A pt assistance form is filled out for the pt to be able to get the next inhaler, since he does not have a PCP. He currently denies any shortness of breath or chest pain. No pedal edema or PND/orthopnea. He had an appointment with general cardiology but cancelled appointment as it was during recent snow days.   Today, he denies symptoms of palpitations, chest pain, shortness of breath, orthopnea, PND, lower extremity edema, dizziness, presyncope, syncope, or neurologic sequela. The patient is tolerating medications without difficulties and is otherwise without complaint today.   Past Medical History  Diagnosis Date  . Asthma   . NICM (nonischemic cardiomyopathy) (HCC)     a. 06/2015 Echo: EF 25-30% - ? Tachy-mediated;    . Chronic systolic CHF (congestive heart failure) (HCC)     a. 06/2015 Echo: EF 25-30%, mod-sev MR, PASP .  .  Moderate to Severe Mitral Regurgitation     a. 06/2015 Echo: Mod-Sev MR.  Marland Kitchen PAF (paroxysmal atrial fibrillation) (HCC)     a. 06/2015. CHA2DS2VASc = 1-2 (CHF, non-obs CAD)-->anticoagulation not started.  . Non-obstructive CAD     a. 06/2015 Cath: LM nl, LAD 78m, LCX nl, RCA nl, EF 25-30%.   Past Surgical History  Procedure Laterality Date  . Cardiac catheterization N/A 07/26/2015    Procedure: Left Heart Cath and Coronary Angiography;  Surgeon: Corky Crafts, MD;  Location[facility] INVASIVE CV LAB;  Service: Cardiovascular;  Laterality: N/A;    Current Outpatient Prescriptions  Medication Sig Dispense Refill  . albuterol (PROVENTIL HFA;VENTOLIN HFA) 108 (90 Base) MCG/ACT inhaler Inhale 2 puffs into the lungs every 6 (six) hours as needed for wheezing or shortness of breath. 1 Inhaler 3  . aspirin EC 81 MG EC tablet Take 1 tablet (81 mg total) by mouth daily. 30 tablet 0  . carvedilol (COREG) 6.25 MG tablet Take 1 tablet (6.25 mg total) by mouth 2 (two) times daily with a meal. 30 tablet 0  . digoxin (LANOXIN) 0.125 MG tablet Take 1 tablet (0.125 mg total) by mouth daily. 30 tablet 0  . magnesium oxide (MAG-OX) 400 (241.3 Mg) MG tablet Take 1 tablet (400 mg total) by mouth daily. 30 tablet 0  . Spacer/Aero-Holding Chambers (AEROCHAMBER PLUS FLO-VU MEDIUM) MISC 1 each by Other route once. 1 each 0  . pantoprazole (PROTONIX) 40  MG tablet Take 1 tablet (40 mg total) by mouth daily. (Patient not taking: Reported on 08/20/2015) 30 tablet 0   No current facility-administered medications for this encounter.    No Known Allergies  Social History   Social History  . Marital Status: Single    Spouse Name: N/A  . Number of Children: N/A  . Years of Education: N/A   Occupational History  . Not on file.   Social History Main Topics  . Smoking status: Former Smoker -- 0.50 packs/day for 40 years    Types: Cigarettes  . Smokeless tobacco: Not on file     Comment: Quit in 2014  . Alcohol  Use: 0.0 oz/week    0 Standard drinks or equivalent per week     Comment: 2-3 drinks per week.  . Drug Use: Yes     Comment: Not currently. Quit in 2014. 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  . Sexual Activity: Not on file   Other Topics Concern  . Not on file   Social History Narrative    Family History  Problem Relation Age of Onset  . Hypertension Father     ROS- All systems are reviewed and negative except as per the HPI above  Physical Exam: Filed Vitals:   08/20/15 1057  BP: 126/78  Pulse: 94  Height: 6\' 1"  (1.854 m)  Weight: 215 lb (97.523 kg)    GEN- The patient is well appearing, alert and oriented x 3 today.   Head- normocephalic, atraumatic Eyes-  Sclera clear, conjunctiva pink Ears- hearing intact Oropharynx- clear Neck- supple, no JVP Lymph- no cervical lymphadenopathy Lungs- Clear to ausculation bilaterally, normal work of breathing Heart- Regular rate and rhythm, no murmurs, rubs or gallops, PMI not laterally displaced GI- soft, NT, ND, + BS Extremities- no clubbing, cyanosis, or edema MS- no significant deformity or atrophy Skin- no rash or lesion Psych- euthymic mood, full affect Neuro- strength and sensation are intact  EKG- Sr with pac's v rate 94 bpm, pr int 150 ms, qrs int 78 ms, qtc 455 ms Epic records reviewed  Assessment and Plan: 1. PAF In SR with pac's Continue carvedilol and digoxin Would not initiate antiarrythmic's at this point if afib burden stays low Reviewed triggers for afib Denies current alcohol or illicit drug use Minimal caffeine, no alcohol use currently Denies snoring Chadsvasc score of 2(CAD/LV dysfunction)  Discussed blood thinners and per guidelines, pt should be anticoagulated For now increase ASA to 325 mg a day He does not have insurance so coumadin is his most affordable drug to use, plus with  MR, he probably qualifies as valvular afib He will be referred to the coumadin clinic Bmet,mag as requested in d/c  summary  2. Asthma/?copd Discharge instructions suggest PFT's as outpt Albuterol inhaler is helping, but with  pt using daily,  a long acting inhaler  may serve pt's symptoms better  Pt does not have a PCP CT showed a 9 mm nodule in the right lateral costophrenic sulcus  which will also need f/u over time  3. Acute systolic heart failure/ NICM LVEF 25-30% with mod-severe MR   Will arrange to have him f/u with general cardiology on flex schedule at [facility] Dr. [name] did follow with pt in the hospital and will eventually need f/u with him  afib clinic as needed  Lupita Leash C. Matthew Folks Afib [facility] [address] [place], [place] [address] [phone]

## 2015-08-20 NOTE — Patient Instructions (Signed)
Scheduler will be in touch regarding follow up appointments with Dr. [name] and coumadin clinic [phone]

## 2015-09-04 ENCOUNTER — Telehealth: Payer: Self-pay | Admitting: Internal Medicine

## 2015-09-12 ENCOUNTER — Encounter (HOSPITAL_COMMUNITY): Payer: Self-pay | Admitting: *Deleted

## 2015-10-01 NOTE — Telephone Encounter (Signed)
Closed Encounter  °

## 2016-08-17 ENCOUNTER — Telehealth (HOSPITAL_COMMUNITY): Payer: Self-pay | Admitting: *Deleted

## 2016-08-17 NOTE — Telephone Encounter (Signed)
Patient called in stating he needed a RX for albuterol inhaler. Instructed patient he needed to call his PCP for this to be refilled. States he does not have a primary physician he uses the ER if needed. Gave patient numbers to community health and wellness as well as the internal medicine clinic as resources for establishing PCP care. Pt was resistant to this states Lupita Leash should just refills this. Told pt he has not been seen in over a year and he was supposed to follow up with Dr. [name] in regards to low EF but no appt was ever made despite multiple attempts by his office. Pt states he is not having any problems and does not want to pay money to be seen. He also states he stopped taking coreg and digoxin. Encouraged pt to make appt with Dr. [name]. Pt was adamant a prescription be called in but was again explained this medication should be prescribed and followed by a PCP.

## 2016-09-15 ENCOUNTER — Inpatient Hospital Stay (HOSPITAL_COMMUNITY)
Admission: EM | Admit: 2016-09-15 | Discharge: 2016-09-22 | DRG: 308 | Disposition: A | Discharge status: Home / Self Care | Payer: Self-pay | Attending: Family Medicine | Admitting: Family Medicine

## 2016-09-15 ENCOUNTER — Encounter (HOSPITAL_COMMUNITY): Payer: Self-pay | Admitting: Emergency Medicine

## 2016-09-15 ENCOUNTER — Emergency Department (HOSPITAL_COMMUNITY): Payer: Self-pay

## 2016-09-15 DIAGNOSIS — I429 Cardiomyopathy, unspecified: Secondary | ICD-10-CM | POA: Diagnosis present

## 2016-09-15 DIAGNOSIS — J449 Chronic obstructive pulmonary disease, unspecified: Secondary | ICD-10-CM | POA: Diagnosis present

## 2016-09-15 DIAGNOSIS — I4891 Unspecified atrial fibrillation: Secondary | ICD-10-CM

## 2016-09-15 DIAGNOSIS — R0602 Shortness of breath: Secondary | ICD-10-CM

## 2016-09-15 DIAGNOSIS — I5023 Acute on chronic systolic (congestive) heart failure: Secondary | ICD-10-CM | POA: Diagnosis present

## 2016-09-15 DIAGNOSIS — I248 Other forms of acute ischemic heart disease: Secondary | ICD-10-CM | POA: Diagnosis present

## 2016-09-15 DIAGNOSIS — I480 Paroxysmal atrial fibrillation: Principal | ICD-10-CM | POA: Diagnosis present

## 2016-09-15 DIAGNOSIS — I509 Heart failure, unspecified: Secondary | ICD-10-CM

## 2016-09-15 DIAGNOSIS — Z87891 Personal history of nicotine dependence: Secondary | ICD-10-CM

## 2016-09-15 DIAGNOSIS — I110 Hypertensive heart disease with heart failure: Secondary | ICD-10-CM | POA: Diagnosis present

## 2016-09-15 DIAGNOSIS — N179 Acute kidney failure, unspecified: Secondary | ICD-10-CM | POA: Diagnosis present

## 2016-09-15 DIAGNOSIS — I2510 Atherosclerotic heart disease of native coronary artery without angina pectoris: Secondary | ICD-10-CM | POA: Diagnosis present

## 2016-09-15 DIAGNOSIS — I428 Other cardiomyopathies: Secondary | ICD-10-CM

## 2016-09-15 DIAGNOSIS — Z8249 Family history of ischemic heart disease and other diseases of the circulatory system: Secondary | ICD-10-CM

## 2016-09-15 DIAGNOSIS — T501X5A Adverse effect of loop [high-ceiling] diuretics, initial encounter: Secondary | ICD-10-CM | POA: Diagnosis present

## 2016-09-15 LAB — CBC WITH DIFFERENTIAL/PLATELET
BASOS PCT: 0 %
Basophils Absolute: 0 10*3/uL (ref 0.0–0.1)
Eosinophils Absolute: 0.2 10*3/uL (ref 0.0–0.7)
Eosinophils Relative: 2 %
HEMATOCRIT: 39.8 % (ref 39.0–52.0)
Hemoglobin: 13.2 g/dL (ref 13.0–17.0)
LYMPHS ABS: 2 10*3/uL (ref 0.7–4.0)
Lymphocytes Relative: 22 %
MCH: 27.2 pg (ref 26.0–34.0)
MCHC: 33.2 g/dL (ref 30.0–36.0)
MCV: 81.9 fL (ref 78.0–100.0)
MONO ABS: 0.7 10*3/uL (ref 0.1–1.0)
MONOS PCT: 8 %
NEUTROS ABS: 6.1 10*3/uL (ref 1.7–7.7)
Neutrophils Relative %: 68 %
Platelets: 209 10*3/uL (ref 150–400)
RBC: 4.86 MIL/uL (ref 4.22–5.81)
RDW: 14.6 % (ref 11.5–15.5)
WBC: 9.1 10*3/uL (ref 4.0–10.5)

## 2016-09-15 LAB — URINALYSIS, ROUTINE W REFLEX MICROSCOPIC
Bilirubin Urine: NEGATIVE
GLUCOSE, UA: NEGATIVE mg/dL
HGB URINE DIPSTICK: NEGATIVE
Ketones, ur: NEGATIVE mg/dL
LEUKOCYTES UA: NEGATIVE
NITRITE: NEGATIVE
Protein, ur: 30 mg/dL — AB
SPECIFIC GRAVITY, URINE: 1.009 (ref 1.005–1.030)
pH: 5 (ref 5.0–8.0)

## 2016-09-15 LAB — BASIC METABOLIC PANEL
Anion gap: 6 (ref 5–15)
BUN: 19 mg/dL (ref 6–20)
CALCIUM: 9 mg/dL (ref 8.9–10.3)
CHLORIDE: 109 mmol/L (ref 101–111)
CO2: 27 mmol/L (ref 22–32)
CREATININE: 1.09 mg/dL (ref 0.61–1.24)
GFR calc Af Amer: >60 mL/min (ref >60)
GFR calc non Af Amer: >60 mL/min (ref >60)
GLUCOSE: 136 mg/dL — AB (ref 65–99)
Potassium: 4.2 mmol/L (ref 3.5–5.1)
Sodium: 142 mmol/L (ref 135–145)

## 2016-09-15 LAB — I-STAT CG4 LACTIC ACID, ED: LACTIC ACID, VENOUS: 0.88 mmol/L (ref 0.5–1.9)

## 2016-09-15 LAB — TSH
TSH: 2.361 u[IU]/mL (ref 0.350–4.500)
TSH: 2.335 u[IU]/mL (ref 0.350–4.500)

## 2016-09-15 LAB — BRAIN NATRIURETIC PEPTIDE: B Natriuretic Peptide: 687.7 pg/mL — ABNORMAL HIGH (ref 0.0–100.0)

## 2016-09-15 LAB — TROPONIN I
TROPONIN I: 0.05 ng/mL — AB (ref <0.03)
TROPONIN I: 0.04 ng/mL — AB (ref <0.03)

## 2016-09-15 LAB — I-STAT TROPONIN, ED: Troponin i, poc: 0.04 ng/mL (ref 0.00–0.08)

## 2016-09-15 MED ORDER — FUROSEMIDE 10 MG/ML IJ SOLN
40.0000 mg | Freq: Two times a day (BID) | INTRAMUSCULAR | Status: DC
  Administered 2016-09-15 – 2016-09-17 (×4): 40 mg via INTRAVENOUS
  Filled 2016-09-15 (×5): qty 4

## 2016-09-15 MED ORDER — ASPIRIN 81 MG PO CHEW
324.0000 mg | CHEWABLE_TABLET | Freq: Once | ORAL | Status: AC
  Administered 2016-09-15: 324 mg via ORAL
  Filled 2016-09-15: qty 4

## 2016-09-15 MED ORDER — ONDANSETRON HCL 4 MG/2ML IJ SOLN: 4.0000 mg | Freq: Four times a day (QID) | INTRAMUSCULAR | Status: DC | PRN

## 2016-09-15 MED ORDER — SODIUM CHLORIDE 0.9% FLUSH
3.0000 mL | Freq: Two times a day (BID) | INTRAVENOUS | Status: DC
  Administered 2016-09-15 – 2016-09-22 (×14): 3 mL via INTRAVENOUS

## 2016-09-15 MED ORDER — ENOXAPARIN SODIUM 40 MG/0.4ML ~~LOC~~ SOLN
40.0000 mg | SUBCUTANEOUS | Status: DC
  Administered 2016-09-15 – 2016-09-18 (×4): 40 mg via SUBCUTANEOUS
  Filled 2016-09-15 (×4): qty 0.4

## 2016-09-15 MED ORDER — SODIUM CHLORIDE 0.9% FLUSH: 3.0000 mL | INTRAVENOUS | Status: DC | PRN

## 2016-09-15 MED ORDER — PANTOPRAZOLE SODIUM 40 MG PO TBEC
40.0000 mg | DELAYED_RELEASE_TABLET | Freq: Every day | ORAL | Status: DC
  Administered 2016-09-15 – 2016-09-22 (×8): 40 mg via ORAL
  Filled 2016-09-15 (×9): qty 1

## 2016-09-15 MED ORDER — DILTIAZEM HCL-DEXTROSE 100-5 MG/100ML-% IV SOLN (PREMIX)
5.0000 mg/h | INTRAVENOUS | Status: AC
  Administered 2016-09-17: 5 mg/h via INTRAVENOUS
  Filled 2016-09-15: qty 100

## 2016-09-15 MED ORDER — FUROSEMIDE 10 MG/ML IJ SOLN
40.0000 mg | INTRAMUSCULAR | Status: AC
  Administered 2016-09-15: 40 mg via INTRAVENOUS
  Filled 2016-09-15: qty 4

## 2016-09-15 MED ORDER — ACETAMINOPHEN 325 MG PO TABS
650.0000 mg | ORAL_TABLET | ORAL | Status: DC | PRN
  Administered 2016-09-15: 650 mg via ORAL
  Filled 2016-09-15: qty 2

## 2016-09-15 MED ORDER — DILTIAZEM HCL 25 MG/5ML IV SOLN
10.0000 mg | Freq: Once | INTRAVENOUS | Status: AC
  Administered 2016-09-15: 10 mg via INTRAVENOUS
  Filled 2016-09-15: qty 5

## 2016-09-15 MED ORDER — ALBUTEROL SULFATE (2.5 MG/3ML) 0.083% IN NEBU
2.5000 mg | INHALATION_SOLUTION | RESPIRATORY_TRACT | Status: DC | PRN
  Administered 2016-09-15: 2.5 mg via RESPIRATORY_TRACT
  Filled 2016-09-15: qty 3

## 2016-09-15 MED ORDER — TRAMADOL HCL 50 MG PO TABS
50.0000 mg | ORAL_TABLET | Freq: Four times a day (QID) | ORAL | Status: DC | PRN
  Administered 2016-09-15 – 2016-09-18 (×4): 50 mg via ORAL
  Filled 2016-09-15 (×4): qty 1

## 2016-09-15 MED ORDER — METOPROLOL TARTRATE 25 MG PO TABS
12.5000 mg | ORAL_TABLET | Freq: Two times a day (BID) | ORAL | Status: DC
  Administered 2016-09-15 – 2016-09-17 (×4): 12.5 mg via ORAL
  Filled 2016-09-15 (×4): qty 1

## 2016-09-15 MED ORDER — LISINOPRIL 2.5 MG PO TABS
2.5000 mg | ORAL_TABLET | Freq: Every day | ORAL | Status: DC
  Administered 2016-09-15: 2.5 mg via ORAL
  Filled 2016-09-15 (×2): qty 1

## 2016-09-15 MED ORDER — SODIUM CHLORIDE 0.9 % IV SOLN
250.0000 mL | INTRAVENOUS | Status: DC | PRN
  Administered 2016-09-17: 250 mL via INTRAVENOUS

## 2016-09-15 MED ORDER — DILTIAZEM HCL-DEXTROSE 100-5 MG/100ML-% IV SOLN (PREMIX)
5.0000 mg/h | Freq: Once | INTRAVENOUS | Status: AC
  Administered 2016-09-15: 5 mg/h via INTRAVENOUS
  Filled 2016-09-15: qty 100

## 2016-09-15 NOTE — ED Provider Notes (Addendum)
[facility]-EMERGENCY DEPT Provider Note   CSN: [id number] Arrival date & time: 09/15/16 0915     History    Chief Complaint  Patient presents with  . Shortness of Breath     HPI Kevin [name] is a 61 y.o. male.  61yo M w/ PMH including A fib, COPD, CHF, NICM who p/w Shortness of breath. The patient reports approximately 1 week history of progressively worsening shortness of breath, the shortness of breath has been much worse over the past few days. He has tried his inhaler which makes his heart race but has not relieved his shortness of breath. He denies any associated chest pain. The dyspnea is worse with exertion. He reports decreased energy recently. He denies any fevers, vomiting, diarrhea, or infectious symptoms. He was admitted in 2016 for atrial fibrillation and started on several medications but has not been on this medication since his prescriptions ran out. He has not followed with a PCP or cardiology recently. He denies any major changes in his weight or significant increase in leg swelling.   Past Medical History:  Diagnosis Date  . Asthma   . Chronic systolic CHF (congestive heart failure) (HCC)    a. 06/2015 Echo: EF 25-30%, mod-sev MR, PASP .  . Moderate to Severe Mitral Regurgitation    a. 06/2015 Echo: Mod-Sev MR.  Marland Kitchen NICM (nonischemic cardiomyopathy) (HCC)    a. 06/2015 Echo: EF 25-30% - ? Tachy-mediated;    . Non-obstructive CAD    a. 06/2015 Cath: LM nl, LAD 32m, LCX nl, RCA nl, EF 25-30%.  Marland Kitchen PAF (paroxysmal atrial fibrillation) (HCC)    a. 06/2015. CHA2DS2VASc = 1-2 (CHF, non-obs CAD)-->anticoagulation not started.     Patient Active Problem List   Diagnosis Date Noted  . PAF (paroxysmal atrial fibrillation) (HCC)   . Nonischemic cardiomyopathy (HCC)   . Coronary artery disease involving native coronary artery of native heart without angina pectoris   . Acute systolic CHF (congestive heart failure), NYHA class 3 (HCC) 07/26/2015  . Atrial fibrillation  with RVR (HCC) 07/23/2015  . COPD (chronic obstructive pulmonary disease) (HCC) 07/23/2015  . Elevated troponin 07/23/2015  . Elevated brain natriuretic peptide (BNP) level 07/23/2015    Past Surgical History:  Procedure Laterality Date  . CARDIAC CATHETERIZATION N/A 07/26/2015   Procedure: Left Heart Cath and Coronary Angiography;  Surgeon: Corky Crafts, MD;  Location: [facility] INVASIVE CV LAB;  Service: Cardiovascular;  Laterality: N/A;        Home Medications    Prior to Admission medications   Medication Sig Start Date End Date Taking? Authorizing Provider  albuterol (PROVENTIL HFA;VENTOLIN HFA) 108 (90 Base) MCG/ACT inhaler Inhale 2 puffs into the lungs every 6 (six) hours as needed for wheezing or shortness of breath. 08/20/15  Yes Newman Nip, NP  lansoprazole (PREVACID) 30 MG capsule Take 30 mg by mouth daily as needed (for heartburn/indigestion).   Yes Historical Provider, MD      Family History  Problem Relation Age of Onset  . Hypertension Father      Social History  Substance Use Topics  . Smoking status: Former Smoker    Packs/day: 0.50    Years: 40.00    Types: Cigarettes  . Smokeless tobacco: Not on file     Comment: Quit in 2014  . Alcohol use 0.0 oz/week     Comment: 2-3 drinks per week.     Allergies     Patient has no known allergies.    Review of  Systems  10 Systems reviewed and are negative for acute change except as noted in the HPI.   Physical Exam Updated Vital Signs BP 117/89 (BP Location: Right Arm)   Pulse 70   Temp 98 F (36.7 C) (Oral)   Resp 22   Ht 6\' 1"  (1.854 m)   Wt 205 lb (93 kg)   SpO2 95%   BMI 27.05 kg/m   Physical Exam  Constitutional: He is oriented to person, place, and time. He appears well-developed and well-nourished. No distress.  HENT:  Head: Normocephalic and atraumatic.  Moist mucous membranes  Eyes: Conjunctivae are normal. Pupils are equal, round, and reactive to light.  Neck: Neck  supple.  Cardiovascular: Normal heart sounds.  An irregularly irregular rhythm present. Tachycardia present.   No murmur heard. Pulmonary/Chest: Effort normal.  Diminished BS b/l, faint crackles in bases  Abdominal: Soft. Bowel sounds are normal. He exhibits no distension. There is no tenderness.  Musculoskeletal: He exhibits edema (1+ pitting BLE).  Neurological: He is alert and oriented to person, place, and time.  Fluent speech  Skin: Skin is warm and dry.  Psychiatric: He has a normal mood and affect. Judgment normal.  Nursing note and vitals reviewed.     ED Treatments / Results  Labs (all labs ordered are listed, but only abnormal results are displayed) Labs Reviewed  BASIC METABOLIC PANEL - Abnormal; Notable for the following:       Result Value   Glucose, Bld 136 (*)    All other components within normal limits  BRAIN NATRIURETIC PEPTIDE - Abnormal; Notable for the following:    B Natriuretic Peptide 687.7 (*)    All other components within normal limits  CBC WITH DIFFERENTIAL/PLATELET  I-STAT CG4 LACTIC ACID, ED  I-STAT TROPOININ, ED     EKG  EKG Interpretation  Date/Time:  Tuesday September 15 2016 09:28:20 EST Ventricular Rate:  149 PR Interval:    QRS Duration: 86 QT Interval:  306 QTC Calculation: 495 R Axis:   15 Text Interpretation:  Atrial fibrillation Borderline repolarization abnormality Borderline prolonged QT interval Baseline wander in lead(s) V3 V4 V5 A fib with RVR new from previous EKG Confirmed by LITTLE MD, RACHEL [phone]) on 09/15/2016 9:33:24 AM         Radiology Dg Chest Port 1 View  Result Date: 09/15/2016 CLINICAL DATA:  Worsening shortness of breath over the last few days, history of asthma and atrial fibrillation EXAM: PORTABLE CHEST 1 VIEW COMPARISON:  Chest x-ray of 07/23/2015 and CT angio chest of the same day FINDINGS: Moderate cardiomegaly is stable. Somewhat prominent interstitial markings remain throughout the lungs but no  definite pneumonia or effusion is seen. No definite congestive heart failure is evident. The bony thorax appears intact with degenerative changes present in the shoulders. IMPRESSION: Stable moderate cardiomegaly. No definite active process. Slight prominence of interstitial markings. Electronically Signed   By: Dwyane Dee M.D.   On: 09/15/2016 10:01    Procedures Procedures (including critical care time) .Critical Care Performed by: Laurence Spates Authorized by: Laurence Spates   Critical care provider statement:    Critical care time (minutes):  35   Critical care time was exclusive of:  Separately billable procedures and treating other patients   Critical care was necessary to treat or prevent imminent or life-threatening deterioration of the following conditions:  Cardiac failure   Critical care was time spent personally by me on the following activities:  Development of treatment plan  with patient or surrogate, discussions with consultants, evaluation of patient's response to treatment, examination of patient, obtaining history from patient or surrogate, ordering and performing treatments and interventions, ordering and review of laboratory studies, ordering and review of radiographic studies, re-evaluation of patient's condition and review of old charts     Medications Ordered in ED  Medications  diltiazem (CARDIZEM) injection 10 mg (10 mg Intravenous Given 09/15/16 0952)  aspirin chewable tablet 324 mg (324 mg Oral Given 09/15/16 1038)  diltiazem (CARDIZEM) 100 mg in dextrose 5% (1 mg/mL) infusion (5 mg/hr Intravenous New Bag/Given 09/15/16 1043)  furosemide (LASIX) injection 40 mg (40 mg Intravenous Given 09/15/16 1102)     Initial Impression / Assessment and Plan / ED Course  I have reviewed the triage vital signs and the nursing notes.  Pertinent labs & imaging results that were available during my care of the patient were reviewed by me and considered in my  medical decision making (see chart for details).  CHA2DS2/VAS Stroke Risk Points      2 >= 2 Points: High Risk  1 - 1.99 Points: Medium Risk  0 Points: Low Risk    The patient's score has not changed in the past year.:  No Change         Details    Note: External data might be a factor in metrics not marked with    Points Metrics   This score determines the patient's risk of having a stroke if the  patient has atrial fibrillation.       1 Has Congestive Heart Failure:  Yes   1 Has Vascular Disease:  Yes   0 Has Hypertension:  No   0 Age:  66   0 Has Diabetes:  No   0 Had Stroke:  No Had TIA:  No Had thromboembolism:  No   0 Male:  No    This patient's score is 2.     PT w/ h/o A fib previously rate controlled on digoxin, not on medications x 1 year, p/w worsening shortness of breath and decreased energy. He was nontoxic on exam, no respiratory distress, with heart rate variable 130s-150 on monitor. Normal O2 sat, BP low normal at 106/92. EKG shows A fib w/ RVR. Obtained above lab work, gave the patient aspirin and a bolus of diltiazem which slightly improved his rate that he was persistently in RVR therefore initiated a dilt drip.  Labs show reassuring CBC and BMP, negative troponin. BNP elevated at 687, gave 40mg  IV lasix. Chest x-ray without overt pulmonary edema. Discussed with cardiology team who will see pt in consultation. Discussed admission w/ hospitalist, Dr. Arcelia Jew, appreciate his assistance. Pt admitted for further treatment.  Final Clinical Impressions(s) / ED Diagnoses   Final diagnoses:  SOB (shortness of breath)  Atrial fibrillation with RVR [facility])     New Prescriptions   No medications on file       Laurence Spates, MD 09/15/16 1117    Laurence Spates, MD 09/15/16 [phone]

## 2016-09-15 NOTE — Progress Notes (Addendum)
CRITICAL VALUE ALERT  Critical value received: Troponin 0.05  Date of notification: 09/15/17  Time of notification:  1520  Critical value read back: yes  Nurse who received alert:  Harrietta Guardian   MD notified (1st page):  yes  Time of first page: 1529  MD notified (2nd page):  Time of second page:  Responding MD: Dr. Arbutus Leas  Time MD responded:  [phone]

## 2016-09-15 NOTE — ED Notes (Signed)
ED Provider at bedside. 

## 2016-09-15 NOTE — ED Notes (Signed)
X-ray at bedside

## 2016-09-15 NOTE — H&P (Signed)
History and Physical  Kevin [name] HXT:[id number] DOB: Sep 08, 1955 DOA: 09/15/2016   PCP: No primary care provider on file.   Patient coming from: Home  Chief Complaint: dyspnea  HPI:  Kevin [name] is a 61 y.o. male with medical history of systolic CHF and COPD presents with 10 day history of dyspnea and orthopnea. The patient states that he normally walks 200 yards to his bus stop, but he has been having some chest discomfort and shortness of breath even making it halfway there for the past week. The patient was admitted to the hospital from 07/23/2015 through 07/28/2015 with acute systolic CHF. His discharge weight was 213 pounds. Unfortunately he was lost to follow-up and has not seen a physician in one year. He has not taken any of his cardiac medications in one year. He denies any fevers, chills, nausea, vomiting, diarrhea, abdominal pain, dysuria, hematuria, hematochezia, melena. He quit smoking approximately 3-4 years ago. He complains of orthopnea type symptoms but denies PND. He has had to sleep sitting up for the past 3-4 nights.  In the emergency department, the patient was noted to have atrial fibrillation with RVR with HR 140-150.  The patient was given a diltiazem bolus and started on a diltiazem drip. The patient was afebrile and hemodynamically stable with saturations of 97% on room air. Furosemide 40 mg IV was given. BNP was 687.  Assessment/Plan: Acute on chronic systolic CHF/NICM -daily weights -accurate I/O's -fluid restrict -Echo -continue IV Lasix -start lisinopril 2.5 mg daily -07/26/2015 heart catheterization--mild nonobstructive CAD -07/24/2015 echo EF 25-30 percent; PASP 37, mod to severe MR -cardiology has been consulted  Atrial Fibrillation with RVR -continue diltiazem drip -Echocardiogram -TSH -CHADS-VASc 1-2 -defer anticoagulation to cardiology  COPD -stable on RA -Quit smoking 3 years ago  Chest pain -cycle troponins -EKG--Afib with RVR, no  STT changes       Past Medical History:  Diagnosis Date  . Asthma   . Chronic systolic CHF (congestive heart failure) (HCC)    a. 06/2015 Echo: EF 25-30%, mod-sev MR, PASP .  . Moderate to Severe Mitral Regurgitation    a. 06/2015 Echo: Mod-Sev MR.  Marland Kitchen NICM (nonischemic cardiomyopathy) (HCC)    a. 06/2015 Echo: EF 25-30% - ? Tachy-mediated;    . Non-obstructive CAD    a. 06/2015 Cath: LM nl, LAD 35m, LCX nl, RCA nl, EF 25-30%.  Marland Kitchen PAF (paroxysmal atrial fibrillation) (HCC)    a. 06/2015. CHA2DS2VASc = 1-2 (CHF, non-obs CAD)-->anticoagulation not started.   Past Surgical History:  Procedure Laterality Date  . CARDIAC CATHETERIZATION N/A 07/26/2015   Procedure: Left Heart Cath and Coronary Angiography;  Surgeon: Corky Crafts, MD;  Location: [facility] INVASIVE CV LAB;  Service: Cardiovascular;  Laterality: N/A;   Social History:  reports that he has quit smoking. His smoking use included Cigarettes. He has a 20.00 pack-year smoking history. He does not have any smokeless tobacco history on file. He reports that he drinks alcohol. He reports that he uses drugs.   Family History  Problem Relation Age of Onset  . Hypertension Father      No Known Allergies   Prior to Admission medications   Medication Sig Start Date End Date Taking? Authorizing Provider  albuterol (PROVENTIL HFA;VENTOLIN HFA) 108 (90 Base) MCG/ACT inhaler Inhale 2 puffs into the lungs every 6 (six) hours as needed for wheezing or shortness of breath. 08/20/15  Yes Newman Nip, NP  lansoprazole (PREVACID) 30 MG capsule Take 30  mg by mouth daily as needed (for heartburn/indigestion).   Yes Historical Provider, MD    Review of Systems:  Constitutional:  No weight loss, night sweats, Fevers, chills, fatigue.  Head&Eyes: No headache.  No vision loss.  No eye pain or scotoma ENT:  No Difficulty swallowing,Tooth/dental problems,Sore throat,  No ear ache, post nasal drip,  Cardio-vascular:  No chest  pain, Orthopnea, PND, swelling in lower extremities,  dizziness, palpitations  GI:  No  abdominal pain, nausea, vomiting, diarrhea, loss of appetite, hematochezia, melena, heartburn, indigestion, Resp:   No coughing up of blood .No wheezing.No chest wall deformity  Skin:  no rash or lesions.  GU:  no dysuria, change in color of urine, no urgency or frequency. No flank pain.  Musculoskeletal:  No joint pain or swelling. No decreased range of motion. No back pain.  Psych:  No change in mood or affect. No depression or anxiety. Neurologic: No headache, no dysesthesia, no focal weakness, no vision loss. No syncope  Physical Exam: Vitals:   09/15/16 1100 09/15/16 1115 09/15/16 1130 09/15/16 1145  BP: 117/89  126/98   Pulse: 70 80  117  Resp: 22 18 17  (!) 30  Temp: 98 F (36.7 C)     TempSrc: Oral     SpO2: 95% 99% 100% (!) 88%  Weight:      Height:       General:  A&O x 3, NAD, nontoxic, pleasant/cooperative Head/Eye: No conjunctival hemorrhage, no icterus, [place]/AT, No nystagmus ENT:  No icterus,  No thrush, good dentition, no pharyngeal exudate Neck:  No masses, no lymphadenpathy, no bruits CV:  RRR, no rub, no gallop, no S3 Lung:  CTAB, good air movement, no wheeze, no rhonchi Abdomen: soft/NT, +BS, nondistended, no peritoneal signs Ext: No cyanosis, No rashes, No petechiae, No lymphangitis, No edema Neuro: CNII-XII intact, strength 4/5 in bilateral upper and lower extremities, no dysmetria  Labs on Admission:  Basic Metabolic Panel:  Recent Labs Lab 09/15/16 0939  NA 142  K 4.2  CL 109  CO2 27  GLUCOSE 136*  BUN 19  CREATININE 1.09  CALCIUM 9.0   Liver Function Tests: No results for input(s): AST, ALT, ALKPHOS, BILITOT, PROT, ALBUMIN in the last 168 hours. No results for input(s): LIPASE, AMYLASE in the last 168 hours. No results for input(s): AMMONIA in the last 168 hours. CBC:  Recent Labs Lab 09/15/16 0939  WBC 9.1  NEUTROABS 6.1  HGB 13.2  HCT 39.8    MCV 81.9  PLT 209   Coagulation Profile: No results for input(s): INR, PROTIME in the last 168 hours. Cardiac Enzymes: No results for input(s): CKTOTAL, CKMB, CKMBINDEX, TROPONINI in the last 168 hours. BNP: Invalid input(s): POCBNP CBG: No results for input(s): GLUCAP in the last 168 hours. Urine analysis: No results found for: COLORURINE, APPEARANCEUR, LABSPEC, PHURINE, GLUCOSEU, HGBUR, BILIRUBINUR, KETONESUR, PROTEINUR, UROBILINOGEN, NITRITE, LEUKOCYTESUR Sepsis Labs: @LABRCNTIP (procalcitonin:4,lacticidven:4) )No results found for this or any previous visit (from the past 240 hour(s)).   Radiological Exams on Admission: Dg Chest Port 1 View  Result Date: 09/15/2016 CLINICAL DATA:  Worsening shortness of breath over the last few days, history of asthma and atrial fibrillation EXAM: PORTABLE CHEST 1 VIEW COMPARISON:  Chest x-ray of 07/23/2015 and CT angio chest of the same day FINDINGS: Moderate cardiomegaly is stable. Somewhat prominent interstitial markings remain throughout the lungs but no definite pneumonia or effusion is seen. No definite congestive heart failure is evident. The bony thorax appears intact with degenerative  changes present in the shoulders. IMPRESSION: Stable moderate cardiomegaly. No definite active process. Slight prominence of interstitial markings. Electronically Signed   By: Dwyane Dee M.D.   On: 09/15/2016 10:01    EKG: Independently reviewed. Atrial fibrillation with RVR; nonspecific ST changes    Time spent:60 minutes Code Status:   FULL Family Communication:  No Family at bedside Disposition Plan: expect 2-3 day hospitalization Consults called: cardiology DVT Prophylaxis: [place] Lovenox  Petros Ahart, DO  [facility] Pager [phone]  If 7PM-7AM, please contact night-coverage [email or web address] Password [facility] 09/15/2016, 11:57 AM      Active Problems:   Atrial fibrillation with RVR (HCC)   Acute on chronic systolic CHF (congestive heart  failure) (HCC)       Past Medical History:  Diagnosis Date  . Asthma   . Chronic systolic CHF (congestive heart failure) (HCC)    a. 06/2015 Echo: EF 25-30%, mod-sev MR, PASP .  . Moderate to Severe Mitral Regurgitation    a. 06/2015 Echo: Mod-Sev MR.  Marland Kitchen NICM (nonischemic cardiomyopathy) (HCC)    a. 06/2015 Echo: EF 25-30% - ? Tachy-mediated;    . Non-obstructive CAD    a. 06/2015 Cath: LM nl, LAD 30m, LCX nl, RCA nl, EF 25-30%.  Marland Kitchen PAF (paroxysmal atrial fibrillation) (HCC)    a. 06/2015. CHA2DS2VASc = 1-2 (CHF, non-obs CAD)-->anticoagulation not started.   Past Surgical History:  Procedure Laterality Date  . CARDIAC CATHETERIZATION N/A 07/26/2015   Procedure: Left Heart Cath and Coronary Angiography;  Surgeon: Corky Crafts, MD;  Location: [facility] INVASIVE CV LAB;  Service: Cardiovascular;  Laterality: N/A;   Social History:  reports that he has quit smoking. His smoking use included Cigarettes. He has a 20.00 pack-year smoking history. He does not have any smokeless tobacco history on file. He reports that he drinks alcohol. He reports that he uses drugs.   Family History  Problem Relation Age of Onset  . Hypertension Father      No Known Allergies   Prior to Admission medications   Medication Sig Start Date End Date Taking? Authorizing Provider  albuterol (PROVENTIL HFA;VENTOLIN HFA) 108 (90 Base) MCG/ACT inhaler Inhale 2 puffs into the lungs every 6 (six) hours as needed for wheezing or shortness of breath. 08/20/15  Yes Newman Nip, NP  lansoprazole (PREVACID) 30 MG capsule Take 30 mg by mouth daily as needed (for heartburn/indigestion).   Yes Historical Provider, MD    Review of Systems:  Constitutional:  No weight loss, night sweats, Fevers, chills, fatigue.  Head&Eyes: No headache.  No vision loss.  No eye pain or scotoma ENT:  No Difficulty swallowing,Tooth/dental problems,Sore throat,  No ear ache, post nasal drip,  Cardio-vascular:  No chest  pain, Orthopnea, PND, swelling in lower extremities,  dizziness, palpitations  GI:  No  abdominal pain, nausea, vomiting, diarrhea, loss of appetite, hematochezia, melena, heartburn, indigestion, Resp:  No shortness of breath with exertion or at rest. No cough. No coughing up of blood .No wheezing.No chest wall deformity  Skin:  no rash or lesions.  GU:  no dysuria, change in color of urine, no urgency or frequency. No flank pain.  Musculoskeletal:  No joint pain or swelling. No decreased range of motion. No back pain.  Psych:  No change in mood or affect. No depression or anxiety. Neurologic: No headache, no dysesthesia, no focal weakness, no vision loss. No syncope  Physical Exam: Vitals:   09/15/16 1031 09/15/16 1100 09/15/16 1100 09/15/16 1115  BP:  116/88 117/89 117/89   Pulse: (!) 57 (!) 52 70 80  Resp: 23 20 22 18   Temp:   98 F (36.7 C)   TempSrc:   Oral   SpO2: 95% 99% 95% 99%  Weight:      Height:       General:  A&O x 3, NAD, nontoxic, pleasant/cooperative Head/Eye: No conjunctival hemorrhage, no icterus, [place]/AT, No nystagmus ENT:  No icterus,  No thrush, good dentition, no pharyngeal exudate Neck:  No masses, no lymphadenpathy, no bruits CV:  RRR, no rub, no gallop, no S3 Lung:  CTAB, good air movement, no wheeze, no rhonchi Abdomen: soft/NT, +BS, nondistended, no peritoneal signs Ext: No cyanosis, No rashes, No petechiae, No lymphangitis, No edema Neuro: CNII-XII intact, strength 4/5 in bilateral upper and lower extremities, no dysmetria  Labs on Admission:  Basic Metabolic Panel:  Recent Labs Lab 09/15/16 0939  NA 142  K 4.2  CL 109  CO2 27  GLUCOSE 136*  BUN 19  CREATININE 1.09  CALCIUM 9.0   Liver Function Tests: No results for input(s): AST, ALT, ALKPHOS, BILITOT, PROT, ALBUMIN in the last 168 hours. No results for input(s): LIPASE, AMYLASE in the last 168 hours. No results for input(s): AMMONIA in the last 168 hours. CBC:  Recent Labs Lab  09/15/16 0939  WBC 9.1  NEUTROABS 6.1  HGB 13.2  HCT 39.8  MCV 81.9  PLT 209   Coagulation Profile: No results for input(s): INR, PROTIME in the last 168 hours. Cardiac Enzymes: No results for input(s): CKTOTAL, CKMB, CKMBINDEX, TROPONINI in the last 168 hours. BNP: Invalid input(s): POCBNP CBG: No results for input(s): GLUCAP in the last 168 hours. Urine analysis: No results found for: COLORURINE, APPEARANCEUR, LABSPEC, PHURINE, GLUCOSEU, HGBUR, BILIRUBINUR, KETONESUR, PROTEINUR, UROBILINOGEN, NITRITE, LEUKOCYTESUR Sepsis Labs: @LABRCNTIP (procalcitonin:4,lacticidven:4) )No results found for this or any previous visit (from the past 240 hour(s)).   Radiological Exams on Admission: Dg Chest Port 1 View  Result Date: 09/15/2016 CLINICAL DATA:  Worsening shortness of breath over the last few days, history of asthma and atrial fibrillation EXAM: PORTABLE CHEST 1 VIEW COMPARISON:  Chest x-ray of 07/23/2015 and CT angio chest of the same day FINDINGS: Moderate cardiomegaly is stable. Somewhat prominent interstitial markings remain throughout the lungs but no definite pneumonia or effusion is seen. No definite congestive heart failure is evident. The bony thorax appears intact with degenerative changes present in the shoulders. IMPRESSION: Stable moderate cardiomegaly. No definite active process. Slight prominence of interstitial markings. Electronically Signed   By: Dwyane Dee M.D.   On: 09/15/2016 10:01    EKG: Independently reviewed. Atrial Fibrillation with RVR, No STT changes    Time spent:60 minutes Code Status:   FULL Family Communication:  No Family at bedside Disposition Plan: expect 2-3 day hospitalization Consults called: Cardiology DVT Prophylaxis: [place] Lovenox  Mikaili Flippin, DO  [facility] Pager [phone]  If 7PM-7AM, please contact night-coverage [email or web address] Password [facility] 09/15/2016, 11:53 AM

## 2016-09-15 NOTE — Consult Note (Addendum)
Cardiology Consult    [name] ID: Kevin [name] MRN: [id number], DOB/AGE: 01/23/1956   Admit date: 09/15/2016 Date of Consult: 09/15/2016  Primary Physician: No primary care provider on file. Primary Cardiologist: Hilty  Requesting Provider: Dr. Arbutus Leas Reason for Consultation: AF  [name] Profile    61 yo male with PMH of AF, NICM, asthma, and COPD who presented with dyspnea and fatigue. Found to be in AF RVR.   Past Medical History   Past Medical History:  Diagnosis Date  . Asthma   . Chronic systolic CHF (congestive heart failure) (HCC)    a. 06/2015 Echo: EF 25-30%, mod-sev MR, PASP .  . Moderate to Severe Mitral Regurgitation    a. 06/2015 Echo: Mod-Sev MR.  Marland Kitchen NICM (nonischemic cardiomyopathy) (HCC)    a. 06/2015 Echo: EF 25-30% - ? Tachy-mediated;    . Non-obstructive CAD    a. 06/2015 Cath: LM nl, LAD 26m, LCX nl, RCA nl, EF 25-30%.  Marland Kitchen PAF (paroxysmal atrial fibrillation) (HCC)    a. 06/2015. CHA2DS2VASc = 1-2 (CHF, non-obs CAD)-->anticoagulation not started.    Past Surgical History:  Procedure Laterality Date  . CARDIAC CATHETERIZATION N/A 07/26/2015   Procedure: Left Heart Cath and Coronary Angiography;  Surgeon: Corky Crafts, MD;  Location: [facility] INVASIVE CV LAB;  Service: Cardiovascular;  Laterality: N/A;     Allergies  No Known Allergies  History of Present Illness    Kevin [name] is a 61 yo male with PMH of AF, NICM, asthma, and COPD. He was seen by cardiology back in 12/16-1/17 for new onset AF with reduced EF on echo. He underwent a LHC showing 40% LAD stenosis in Kevin setting of mildly elevated trop. During that admission he was started on coreg, digoxin, and aldactone. Kevin topic of OAC was discussed but held off until he followed up in Kevin outpatient setting. He was lost to follow up. Currently works at [employer], and [employer] Does not follow with a PCP.  He began to notice progressive dyspnea, and fatigue over Kevin past 2 weeks. Denies  any chest pain, or palpitations but was becoming more fatigued with activity.   Presented to [facility] and found to be in Afib RVR. Started on a Dilt gtt. Labs showed stable electrolytes, BNP 687, Hgb 13.2. CXR with no overt edema. EKG showed AF rate 149. He was given a dose of IV lasix in Kevin ED with good UOP.   Inpatient Medications      Family History    Family History  Problem Relation Age of Onset  . Hypertension Father     Social History    Social History   Social History  . Marital status: Single    Spouse name: N/A  . Number of children: N/A  . Years of education: N/A   Occupational History  . Not on file.   Social History Main Topics  . Smoking status: Former Smoker    Packs/day: 0.50    Years: 40.00    Types: Cigarettes  . Smokeless tobacco: Not on file     Comment: Quit in 2014  . Alcohol use 0.0 oz/week     Comment: 2-3 drinks per week.  . Drug use: Yes     Comment: Not currently. Quit in 2014. 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  . Sexual activity: Not on file   Other Topics Concern  . Not on file   Social History Narrative  . No narrative on file     Review  of Systems    General:  No chills, fever, night sweats or weight changes.  Cardiovascular:  No chest pain, ++ dyspnea on exertion, edema, orthopnea, palpitations, paroxysmal nocturnal dyspnea. Dermatological: No rash, lesions/masses Respiratory: No cough, dyspnea Urologic: No hematuria, dysuria Abdominal:   No nausea, vomiting, diarrhea, bright red blood per rectum, melena, or hematemesis Neurologic:  No visual changes, wkns, changes in mental status. All other systems reviewed and are otherwise negative except as noted above.  Physical Exam    Blood pressure 110/91, pulse 64, temperature 98 F (36.7 C), temperature source Oral, resp. rate 23, height 6\' 1"  (1.854 m), weight 205 lb (93 kg), SpO2 97 %.  General: Kevin [name], NAD Psych: Normal affect. Neuro: Alert and oriented X 3. Moves  all extremities spontaneously. HEENT: Normal  Neck: Supple without bruits or JVD. Lungs:  Resp regular and unlabored, CTA. Heart: RRR no s3, s4, or murmurs. Abdomen: Soft, non-tender, non-distended, BS + x 4.  Extremities: No clubbing, cyanosis or edema. DP/PT/Radials 2+ and equal bilaterally.  Labs    Troponin [facility] Care Test)  Recent Labs  09/15/16 0951  TROPIPOC 0.04   No results for input(s): CKTOTAL, CKMB, TROPONINI in Kevin last 72 hours. Lab Results  Component Value Date   WBC 9.1 09/15/2016   HGB 13.2 09/15/2016   HCT 39.8 09/15/2016   MCV 81.9 09/15/2016   PLT 209 09/15/2016    Recent Labs Lab 09/15/16 0939  NA 142  K 4.2  CL 109  CO2 27  BUN 19  CREATININE 1.09  CALCIUM 9.0  GLUCOSE 136*   Lab Results  Component Value Date   CHOL 120 07/26/2015   HDL 27 (L) 07/26/2015   LDLCALC 72 07/26/2015   TRIG 103 07/26/2015   No results found for: [facility]   Radiology Studies    Dg Chest Port 1 View  Result Date: 09/15/2016 CLINICAL DATA:  Worsening shortness of breath over Kevin last few days, history of asthma and atrial fibrillation EXAM: PORTABLE CHEST 1 VIEW COMPARISON:  Chest x-ray of 07/23/2015 and CT angio chest of Kevin same day FINDINGS: Moderate cardiomegaly is stable. Somewhat prominent interstitial markings remain throughout Kevin lungs but no definite pneumonia or effusion is seen. No definite congestive heart failure is evident. Kevin bony thorax appears intact with degenerative changes present in Kevin shoulders. IMPRESSION: Stable moderate cardiomegaly. No definite active process. Slight prominence of interstitial markings. Electronically Signed   By: Dwyane Dee M.D.   On: 09/15/2016 10:01    ECG & Cardiac Imaging    EKG: AF  Echo: 12/16  Study Conclusions  - Left ventricle: Severe inferior and inferolateral wall   hypokinesis Kevin cavity size was moderately dilated. Wall   thickness was normal. Systolic function was severely reduced. Kevin    estimated ejection fraction was in Kevin range of 25% to 30%. - Mitral valve: Moderate to severe MR may be worse Likely ischemic   given low EF and RWMAls Consider TEE if clinically indicated. - Left atrium: Kevin atrium was moderately dilated. - Atrial septum: No defect or patent foramen ovale was identified. - Pulmonary arteries: PA peak pressure: 37 mm Hg (S).  Assessment & Plan    61 yo male with PMH of AF, NICM, asthma, and COPD who presented with dyspnea and fatigue. Found to be in AF RVR.  1. AF RVR: Unclear how long he has been in AF as he is generally asymptomatic.  -- continue with Dilt gtt, but hopeful  wean as he had known reduced EF. Will add metoprolol 12.5mg  BID and titrate as blood pressure allows.  -- will ask case management for assistance regarding OAC with Xarelto/Eliquis as he is currently without insurance.  -- This patients CHA2DS2-VASc Score and unadjusted Ischemic Stroke Rate (% per year) is equal to 2.2 % stroke rate/year from a score of 2  -- hopefully will convert, but would plan on 3 weeks with [facility] with follow up in Kevin office to discuss DCCV.  -- check TSH  2. Acute on chronic systolic HF: BNP elevated on admission. Given one dose of IV lasix 40mg  with good UOP.  -- monitor I&Os, daily weights.  -- continue with IV lasix, monitor BMET -- check echo   Macauley, [place], NP-C Pager [phone] 09/15/2016, 12:14 PM    Attending Note:   Kevin [name] was seen and examined.  Agree with assessment and plan as noted above.  Changes made to Kevin above note as needed.  [name] seen and independently examined with Laverda Page, NP.   We discussed all aspects of Kevin encounter. I agree with Kevin assessment and plan as stated above.  1. New diagnosis of atrial fib: CHADS2VASC =   3 ( HTN, CAD, CHF)  Pt may have had AF for a long time.  Presents with several months of DOE. No angina . Will need to start anticoagulation - will have Kevin case manager discuss  options - we may be able to get one of Kevin companies to supply him with medication.   He does not drive and would likely have problems getting Kevin Kevin office for INR visits.   Will add metoprolol 25 mg PO BID to his IV diltiazem.  Titrate up as needed for rate control  Echo to be done.   If his EF is low, will anticipate replacing Kevin diltiazem with metoprolol   2. Essential HTN:  Add metoprolol. Will likely need a diuretic Needs to reduce his salt intake   3. Chronic systolic CHF:  Had a cath in 2016 - has moderate CAD.   He does not take his meds.    I have spent a total of 40 minutes with [name] reviewing hospital  notes , telemetry, EKGs, labs and examining [name] as well as establishing an assessment and plan that was discussed with Kevin [name]. > 50% of time was spent in direct [name] care.    Vesta Mixer, Montez Hageman., MD, [facility] 09/15/2016, 1:40 PM [address],  [address] Office [phone] Pager [phone]

## 2016-09-15 NOTE — Care Management Note (Signed)
Case Management Note  Patient Details  Name: Kevin [name] MRN: [id number] Date of Birth: 03-Jan-1956  Subjective/Objective:          Heart failure          Action/Plan:Home   Expected Discharge Date:   (unknown) [id number]              Expected Discharge Plan:  Home w Home Health Services  In-House Referral:     Discharge planning Services  CM Consult  Post Acute Care Choice:  Home Health Choice offered to:  Patient  DME Arranged:    DME Agency:     HH Arranged:  Disease Management HH Agency:  [facility]  Status of Service:  In process, will continue to follow  If discussed at Long Length of Stay Meetings, dates discussed:    Additional Comments:  Golda Acre, RN 09/15/2016, 3:27 PM

## 2016-09-15 NOTE — Progress Notes (Signed)
NP on call notified at this time regarding pt BP of 84/42. Will continue to monitor.

## 2016-09-15 NOTE — ED Triage Notes (Signed)
Patient been having SOB over last several days. Patient reports having asthma and afib.  Patient used neb treatment about 30 mins ago for SOB. Patient reports that rain aggravates his SOB.

## 2016-09-15 NOTE — ED Notes (Signed)
Hospitalist at bedside 

## 2016-09-15 NOTE — Clinical Social Work Note (Signed)
Clinical Social Work Assessment  Patient Details  Name: Kevin [name] MRN: [id number] Date of Birth: Jan 09, 1956  Date of referral:  09/15/16               Reason for consult:  Discharge Planning, Substance Use/ETOH Abuse                Permission sought to share information with:  Family Supports Permission granted to share information::  No  Name::        Agency::     Relationship::     Contact Information:     Housing/Transportation Living arrangements for the past 2 months:   (Pt has lived for two years in a sober living house called "[facility]" which is similar to a boarding house with a condition that a person remain sober and attend 12-step meetings) Source of Information:  Patient Patient Interpreter Needed:  None Criminal Activity/Legal Involvement Pertinent to Current Situation/Hospitalization:    Significant Relationships:  Adult Children Lives with:  Other (Comment) (Other residents at home) Do you feel safe going back to the place where you live?  Yes Need for family participation in patient care:  No (Coment)  Care giving concerns:  None listed by pt/family    Social Worker assessment / plan: CSW met with pt and confirmed pt's plan to be discharged back to his home to live at discharge. Pt has lived for two years in a sober living house called "[facility]" which is similar to a boarding house with a condition that a person remain sober and attend 12-step meetings.  It is for this reason the CSW did not complete an SBIRT.  Pt expects to D/C and return home to live and work at [employer].  Pt does not have insurance and expects to return to work despite his condition.  CSW gave pt resources for applying for disability including information on the [place] (D[employer]) sand the Administrator office on [employer] in [place].  Pt accepted this information.  Pt asked that CSW not contact the owner of his sober  living house at this time.  Source of Information:  Patient.  Pt has been living independently prior to discharge.   Employment status:  [employer] information:  Self Pay (Medicaid Pending) PT Recommendations:  Not assessed at this time Information / Referral to community resources:     Patient/Family's Response to care:  Patient alert and oriented.  Patient agreeable to plan to return home.  Pt's son supportive and strongly involved in pt.'s care.  Pt pleasant and appreciated CSW intervention.     Patient/Family's Understanding of and Emotional Response to Diagnosis, Current Treatment, and Prognosis:  Still assessing   Emotional Assessment Appearance:  Appears stated age Attitude/Demeanor/Rapport:    Affect (typically observed):  Accepting, Calm, Quiet, Pleasant Orientation:  Oriented to Self, Oriented to Place, Oriented to  Time, Oriented to Situation Alcohol / Substance use:    Psych involvement (Current and /or in the community):     Discharge Needs  Concerns to be addressed:  No discharge needs identified Readmission within the last 30 days:  No Current discharge risk:  None Barriers to Discharge:  No Barriers Identified   Claudine Mouton, LCSWA 09/15/2016, 10:57 PM

## 2016-09-16 ENCOUNTER — Inpatient Hospital Stay (HOSPITAL_COMMUNITY): Payer: Self-pay

## 2016-09-16 DIAGNOSIS — I5023 Acute on chronic systolic (congestive) heart failure: Secondary | ICD-10-CM

## 2016-09-16 DIAGNOSIS — I428 Other cardiomyopathies: Secondary | ICD-10-CM

## 2016-09-16 DIAGNOSIS — I509 Heart failure, unspecified: Secondary | ICD-10-CM

## 2016-09-16 DIAGNOSIS — I480 Paroxysmal atrial fibrillation: Principal | ICD-10-CM

## 2016-09-16 LAB — BASIC METABOLIC PANEL
Anion gap: 7 (ref 5–15)
BUN: 25 mg/dL — AB (ref 6–20)
CHLORIDE: 106 mmol/L (ref 101–111)
CO2: 27 mmol/L (ref 22–32)
CREATININE: 1.64 mg/dL — AB (ref 0.61–1.24)
Calcium: 8.8 mg/dL — ABNORMAL LOW (ref 8.9–10.3)
GFR calc Af Amer: 50 mL/min — ABNORMAL LOW (ref >60)
GFR, EST NON AFRICAN AMERICAN: 43 mL/min — AB (ref >60)
GLUCOSE: 92 mg/dL (ref 65–99)
POTASSIUM: 4.5 mmol/L (ref 3.5–5.1)
Sodium: 140 mmol/L (ref 135–145)

## 2016-09-16 LAB — TROPONIN I: Troponin I: 0.04 ng/mL (ref <0.03)

## 2016-09-16 LAB — ECHOCARDIOGRAM COMPLETE
HEIGHTINCHES: 73 in
WEIGHTICAEL: 3608.49 [oz_av]

## 2016-09-16 LAB — HIV ANTIBODY (ROUTINE TESTING W REFLEX): HIV Screen 4th Generation wRfx: NONREACTIVE

## 2016-09-16 NOTE — Progress Notes (Signed)
[facility]  PROGRESS NOTE  Kevin [name] ZOX:[id number] DOB: 04-06-1956 DOA: 09/15/2016 PCP: No primary care provider on file.   Brief HPI:    61 y.o. male with medical history of systolic CHF and COPD presents with 10 day history of dyspnea and orthopnea. The patient states that he normally walks 200 yards to his bus stop, but he has been having some chest discomfort and shortness of breath even making it halfway there for the past week. The patient was admitted to the hospital from 07/23/2015 through 07/28/2015 with acute systolic CHF. His discharge weight was 213 pounds. Unfortunately he was lost to follow-up and has not seen a physician in one year. He has not taken any of his cardiac medications in one year. He denies any fevers, chills, nausea, vomiting, diarrhea, abdominal pain, dysuria, hematuria, hematochezia, melena. He quit smoking approximately 3-4 years ago. He complains of orthopnea type symptoms but denies PND. He has had to sleep sitting up for the past 3-4 nights.  In the emergency department, the patient was noted to have atrial fibrillation with RVR with HR 140-150.  The patient was given a diltiazem bolus and started on a diltiazem drip. The patient was afebrile and hemodynamically stable with saturations of 97% on room air. Furosemide 40 mg IV was given. BNP was 687   Subjective   Patient seen and examined, breathing has improved.   Assessment/Plan:     1. Acute on chronic systolic CHF- patient's echo from 2016 showed EF 25-30%, moderate to severe MR. Continue IV Lasix 40 mg every 12 hours. Echocardiogram done this morning, results are pending. Cardiology following. 2. Atrial fibrillation with RVR-patient started on Cardizem drip, cardiology discussing anticoagulation options.CHA2DS2VASc score is 2. TSH normal. 3. Acute kidney injury- creatinine is 1.64, yesterday was 1.09, secondary to IV Lasix. Will follow BMP in a.m. 4. COPD- stable. 5. Chest pain- resolved, has  mild elevation of troponin. Likely from demand ischemia due to  A. fib with RVR    DVT prophylaxis: Lovenox  Code Status: Full code  Family Communication: No family present at bedside   Disposition Plan: Home when breathing improves   Consultants:  Cardiology  Procedures:  None   Continuous infusions . diltiazem (CARDIZEM) infusion 5 mg/hr (09/16/16 0854)      Antibiotics:   Anti-infectives    None       Objective   Vitals:   09/16/16 1245 09/16/16 1300 09/16/16 1310 09/16/16 1315  BP: 100/81  (!) 134/116 (!) 129/107  Pulse: (!) 103 (!) 128 (!) 115 97  Resp: 18 18 16 15   Temp:      TempSrc:      SpO2: 97% 97% 97% 97%  Weight:      Height:        Intake/Output Summary (Last 24 hours) at 09/16/16 1419 Last data filed at 09/16/16 1100  Gross per 24 hour  Intake              426 ml  Output             1250 ml  Net             -824 ml   Filed Weights   09/15/16 0934 09/15/16 1335 09/16/16 0400  Weight: 93 kg (205 lb) 106.4 kg (234 lb 9.1 oz) 102.3 kg (225 lb 8.5 oz)     Physical Examination:  General exam: Appears calm and comfortable. Respiratory system:  Respiratory effort normal.Bibasilar crackles. Cardiovascular system:  Irregular. No  murmurs, rubs, gallops. Bilateral 1+ pitting edema GI system: Abdomen is nondistended, soft and nontender. No organomegaly.  Central nervous system. No focal neurological deficits. 5 x 5 power in all extremities. Skin: No rashes, lesions or ulcers. Psychiatry: Alert, oriented x 3.Judgement and insight appear normal. Affect normal.    Data Reviewed: I have personally reviewed following labs and imaging studies  CBG: No results for input(s): GLUCAP in the last 168 hours.  CBC:  Recent Labs Lab 09/15/16 0939  WBC 9.1  NEUTROABS 6.1  HGB 13.2  HCT 39.8  MCV 81.9  PLT 209    Basic Metabolic Panel:  Recent Labs Lab 09/15/16 0939 09/16/16 0316  NA 142 140  K 4.2 4.5  CL 109 106  CO2 27 27   GLUCOSE 136* 92  BUN 19 25*  CREATININE 1.09 1.64*  CALCIUM 9.0 8.8*      Cardiac Enzymes:  Recent Labs Lab 09/15/16 1426 09/15/16 1937 09/16/16 0128  TROPONINI 0.05* 0.04* 0.04*   BNP (last 3 results)  Recent Labs  09/15/16 0939  BNP 687.7*    ProBNP (last 3 results) No results for input(s): PROBNP in the last 8760 hours.    Studies: Dg Chest Port 1 View  Result Date: 09/15/2016 CLINICAL DATA:  Worsening shortness of breath over the last few days, history of asthma and atrial fibrillation EXAM: PORTABLE CHEST 1 VIEW COMPARISON:  Chest x-ray of 07/23/2015 and CT angio chest of the same day FINDINGS: Moderate cardiomegaly is stable. Somewhat prominent interstitial markings remain throughout the lungs but no definite pneumonia or effusion is seen. No definite congestive heart failure is evident. The bony thorax appears intact with degenerative changes present in the shoulders. IMPRESSION: Stable moderate cardiomegaly. No definite active process. Slight prominence of interstitial markings. Electronically Signed   By: Dwyane Dee M.D.   On: 09/15/2016 10:01    Scheduled Meds: . enoxaparin (LOVENOX) injection  40 mg Subcutaneous Q24H  . furosemide  40 mg Intravenous BID  . metoprolol tartrate  12.5 mg Oral BID  . pantoprazole  40 mg Oral Daily  . sodium chloride flush  3 mL Intravenous Q12H    Time spent: 25 min  [facility]   [facility] Pager [phone]. If 7PM-7AM, please contact night-coverage at [email or web address], Office  [phone]  password [email or web address] 09/16/2016, 2:19 PM  LOS: 1 day

## 2016-09-16 NOTE — Progress Notes (Signed)
  Echocardiogram 2D Echocardiogram has been performed.  Nolon Rod 09/16/2016, 2:17 PM

## 2016-09-16 NOTE — Progress Notes (Signed)
Progress Note  Patient Name: Kevin [name] Date of Encounter: 09/16/2016  Primary Cardiologist: Hilty   Subjective   61 yo with hx of nonischemic cardiomyopathy, chronic systolic CHF,  Atrial fib  Does not go to the doctor often. Gets his care typically at the hospital  Feeling well this morning. No chest pain, dyspnea has improved.   Inpatient Medications    Scheduled Meds: . enoxaparin (LOVENOX) injection  40 mg Subcutaneous Q24H  . furosemide  40 mg Intravenous BID  . lisinopril  2.5 mg Oral Daily  . metoprolol tartrate  12.5 mg Oral BID  . pantoprazole  40 mg Oral Daily  . sodium chloride flush  3 mL Intravenous Q12H   Continuous Infusions: . diltiazem (CARDIZEM) infusion 5 mg/hr (09/16/16 0854)   PRN Meds: sodium chloride, acetaminophen, albuterol, ondansetron (ZOFRAN) IV, sodium chloride flush, traMADol   Vital Signs    Vitals:   09/16/16 0400 09/16/16 0500 09/16/16 0600 09/16/16 0701  BP: 90/73 111/90 (!) 85/71   Pulse: 96 (!) 102 86   Resp: (!) 21 18 (!) 27   Temp:    97.7 F (36.5 C)  TempSrc:    Oral  SpO2: 96% 96% 96%   Weight: 225 lb 8.5 oz (102.3 kg)     Height:        Intake/Output Summary (Last 24 hours) at 09/16/16 1122 Last data filed at 09/16/16 0900  Gross per 24 hour  Intake              426 ml  Output             1275 ml  Net             -849 ml   Filed Weights   09/15/16 0934 09/15/16 1335 09/16/16 0400  Weight: 205 lb (93 kg) 234 lb 9.1 oz (106.4 kg) 225 lb 8.5 oz (102.3 kg)    Telemetry    AF (rates improved but not controlled) - Personally Reviewed  ECG    N/A - Personally Reviewed  Physical Exam   General: Well developed, well nourished, AA male appearing in no acute distress. Head: Normocephalic, atraumatic.  Neck: Supple without bruits, JVD. Lungs:  Resp regular and unlabored, CTA. Heart: Irreg Irreg, S1, S2, no S3, S4, or murmur; no rub. Abdomen: Soft, non-tender, non-distended with normoactive bowel sounds. No  hepatomegaly. No rebound/guarding. No obvious abdominal masses. Extremities: No clubbing, cyanosis, 2+ bilateral LE edema. Distal pedal pulses are 2+ bilaterally. Neuro: Alert and oriented X 3. Moves all extremities spontaneously. Psych: Normal affect.  Labs    Chemistry Recent Labs Lab 09/15/16 0939 09/16/16 0316  NA 142 140  K 4.2 4.5  CL 109 106  CO2 27 27  GLUCOSE 136* 92  BUN 19 25*  CREATININE 1.09 1.64*  CALCIUM 9.0 8.8*  GFRNONAA >60 43*  GFRAA >60 50*  ANIONGAP 6 7     Hematology Recent Labs Lab 09/15/16 0939  WBC 9.1  RBC 4.86  HGB 13.2  HCT 39.8  MCV 81.9  MCH 27.2  MCHC 33.2  RDW 14.6  PLT 209    Cardiac Enzymes Recent Labs Lab 09/15/16 1426 09/15/16 1937 09/16/16 0128  TROPONINI 0.05* 0.04* 0.04*    Recent Labs Lab 09/15/16 0951  TROPIPOC 0.04     BNP Recent Labs Lab 09/15/16 0939  BNP 687.7*     DDimer No results for input(s): DDIMER in the last 168 hours.    Radiology  Dg Chest Port 1 View  Result Date: 09/15/2016 CLINICAL DATA:  Worsening shortness of breath over the last few days, history of asthma and atrial fibrillation EXAM: PORTABLE CHEST 1 VIEW COMPARISON:  Chest x-ray of 07/23/2015 and CT angio chest of the same day FINDINGS: Moderate cardiomegaly is stable. Somewhat prominent interstitial markings remain throughout the lungs but no definite pneumonia or effusion is seen. No definite congestive heart failure is evident. The bony thorax appears intact with degenerative changes present in the shoulders. IMPRESSION: Stable moderate cardiomegaly. No definite active process. Slight prominence of interstitial markings. Electronically Signed   By: Dwyane Dee M.D.   On: 09/15/2016 10:01    Cardiac Studies   TTE: pending  Patient Profile     61 y.o. male with PMH of AF, NICM, asthma, and COPD who presented with dyspnea and fatigue. Found to be in AF RVR.  Assessment & Plan    1. AF RVR: Has hx of the same from 2016. Did  not follow up. Unclear how long he has been in AF this time as he is generally asymptomatic.  -- remains on Dilt gtt, and metoprolol. Blood pressures soft today. Will hold lisinopril allow room for further titrate BB and diuresis. Attempting to wean Dilt.  -- will ask case management for assistance regarding OAC with Xarelto/Eliquis as he is currently without insurance. Only option may by Coumadin, states he would be able to make appts at the coumadin clinic.  -- This patients CHA2DS2-VASc Score and unadjusted Ischemic Stroke Rate (% per year) is equal to 2.2 % stroke rate/year from a score of 2  -- hopefully will convert, but would plan on 3 weeks with [facility] with follow up in the office to discuss DCCV.  -- TSH normal   2. Acute on chronic systolic HF: BNP elevated on admission.  Weight inaccurate.  -- monitor I&Os, daily weights. Not much UOP yesterday. Had a bump in Cr.   -- continue with IV lasix, monitor BMET -- echo pending  3. AKI: Bump in Cr today with diuresis and addition of ACEi.  -- hold lisinopril to allow for BB titration  4. Mildly Elevated Trop: Flat non ACS trend in the setting of AF RVR.   Signed, Laverda Page, NP  09/16/2016, 11:22 AM     Attending Note:   The patient was seen and examined.  Agree with assessment and plan as noted above.  Changes made to the above note as needed.  Patient seen and independently examined with Laverda Page, NP.   We discussed all aspects of the encounter. I agree with the assessment and plan as stated above.  1. Atrial fib:   He has had atrial fib for the past 1-2 years. He has not been taking his meds.  - is not an anticoagulation  HR is slower  Breathing is better  Still needs additional rate slowing meds.     I have spent a total of 40 minutes with patient reviewing hospital  notes , telemetry, EKGs, labs and examining patient as well as establishing an assessment and plan that was discussed with the patient. > 50% of time  was spent in direct patient care.  Note to case manager:  He should consider going to the [facility] for primary care and his meds.   Will you look into this for him.   Vesta Mixer, Montez Hageman., MD, [facility] 09/16/2016, 1:48 PM [address],  [address] Office - [phone]  Pager [phone]

## 2016-09-17 DIAGNOSIS — I2510 Atherosclerotic heart disease of native coronary artery without angina pectoris: Secondary | ICD-10-CM

## 2016-09-17 LAB — BASIC METABOLIC PANEL
Anion gap: 7 (ref 5–15)
BUN: 31 mg/dL — AB (ref 6–20)
CO2: 28 mmol/L (ref 22–32)
Calcium: 8.7 mg/dL — ABNORMAL LOW (ref 8.9–10.3)
Chloride: 104 mmol/L (ref 101–111)
Creatinine, Ser: 1.74 mg/dL — ABNORMAL HIGH (ref 0.61–1.24)
GFR calc Af Amer: 47 mL/min — ABNORMAL LOW (ref >60)
GFR, EST NON AFRICAN AMERICAN: 40 mL/min — AB (ref >60)
GLUCOSE: 90 mg/dL (ref 65–99)
POTASSIUM: 4.2 mmol/L (ref 3.5–5.1)
Sodium: 139 mmol/L (ref 135–145)

## 2016-09-17 LAB — URINE CULTURE: Culture: NO GROWTH

## 2016-09-17 MED ORDER — METOPROLOL TARTRATE 25 MG PO TABS
25.0000 mg | ORAL_TABLET | Freq: Two times a day (BID) | ORAL | Status: DC
  Administered 2016-09-17 – 2016-09-18 (×3): 25 mg via ORAL
  Filled 2016-09-17 (×3): qty 1

## 2016-09-17 MED ORDER — FUROSEMIDE 10 MG/ML IJ SOLN
40.0000 mg | Freq: Two times a day (BID) | INTRAMUSCULAR | Status: DC
  Administered 2016-09-18 – 2016-09-22 (×9): 40 mg via INTRAVENOUS
  Filled 2016-09-17 (×9): qty 4

## 2016-09-17 NOTE — Progress Notes (Signed)
Pt is self pay, no insurance. Eliquis or Xarelto can not be checked. However, pt can receive a 30 day free trail card for either medications.

## 2016-09-17 NOTE — Progress Notes (Signed)
Progress Note  Patient Name: Kevin [name] Date of Encounter: 09/17/2016  Primary Cardiologist: Hilty   Subjective   61 yo with hx of nonischemic cardiomyopathy, chronic systolic CHF,  Atrial fib  Does not go to the doctor often. Gets his care typically at the hospital  Feeling well this morning. No chest pain, dyspnea has improved.  Is complaining of pleuretic CP today    Inpatient Medications    Scheduled Meds: . enoxaparin (LOVENOX) injection  40 mg Subcutaneous Q24H  . furosemide  40 mg Intravenous BID  . metoprolol tartrate  12.5 mg Oral BID  . pantoprazole  40 mg Oral Daily  . sodium chloride flush  3 mL Intravenous Q12H   Continuous Infusions: . diltiazem (CARDIZEM) infusion 5 mg/hr (09/17/16 0637)   PRN Meds: sodium chloride, acetaminophen, albuterol, ondansetron (ZOFRAN) IV, sodium chloride flush, traMADol   Vital Signs    Vitals:   09/17/16 0500 09/17/16 0600 09/17/16 0800 09/17/16 0939  BP: 110/62 96/79 93/81  95/72  Pulse: (!) 116 (!) 104 (!) 112 (!) 107  Resp: 15 (!) 21 18   Temp:   98.3 F (36.8 C)   TempSrc:   Oral   SpO2: 98% 98% 98%   Weight:      Height:        Intake/Output Summary (Last 24 hours) at 09/17/16 1342 Last data filed at 09/17/16 0940  Gross per 24 hour  Intake           175.84 ml  Output             1900 ml  Net         -1724.16 ml   Filed Weights   09/15/16 1335 09/16/16 0400 09/17/16 0234  Weight: 234 lb 9.1 oz (106.4 kg) 225 lb 8.5 oz (102.3 kg) 227 lb 1.2 oz (103 kg)    Telemetry    AF (rates 110-120s) - Personally Reviewed  ECG    N/A - Personally Reviewed  Physical Exam   General: Well developed, well nourished, AA male appearing in no acute distress. Head: Normocephalic, atraumatic.  Neck: Supple without bruits, JVD. Lungs:  Resp regular and unlabored, CTA. Heart: Irreg Irreg, S1, S2, no S3, S4, or murmur; no rub. Abdomen: Soft, non-tender, non-distended with normoactive bowel sounds. No hepatomegaly.  No rebound/guarding. No obvious abdominal masses. Extremities: No clubbing, cyanosis, 1+ bilateral LE edema. Distal pedal pulses are 2+ bilaterally. Neuro: Alert and oriented X 3. Moves all extremities spontaneously. Psych: Normal affect.  Labs    Chemistry  Recent Labs Lab 09/15/16 [phone] 09/16/16 0316 09/17/16 0403  NA 142 140 139  K 4.2 4.5 4.2  CL 109 106 104  CO2 27 27 28   GLUCOSE 136* 92 90  BUN 19 25* 31*  CREATININE 1.09 1.64* 1.74*  CALCIUM 9.0 8.8* 8.7*  GFRNONAA >60 43* 40*  GFRAA >60 50* 47*  ANIONGAP 6 7 7      Hematology  Recent Labs Lab 09/15/16 0939  WBC 9.1  RBC 4.86  HGB 13.2  HCT 39.8  MCV 81.9  MCH 27.2  MCHC 33.2  RDW 14.6  PLT 209    Cardiac Enzymes  Recent Labs Lab 09/15/16 1426 09/15/16 1937 09/16/16 0128  TROPONINI 0.05* 0.04* 0.04*     Recent Labs Lab 09/15/16 0951  TROPIPOC 0.04     BNP  Recent Labs Lab 09/15/16 0939  BNP 687.7*     DDimer No results for input(s): DDIMER in the last 168 hours.  Radiology    No results found.  Cardiac Studies   TTE: 09/16/16  Study Conclusions  - Left ventricle: The cavity size was mildly dilated. Wall   thickness was normal. Systolic function was severely reduced. The   estimated ejection fraction was in the range of 20% to 25%.   Diffuse hypokinesis. Indeterminant diastolic function (atrial   fibrillation). - Aortic valve: There was no stenosis. - Mitral valve: There was moderate regurgitation. - Left atrium: The atrium was severely dilated. - Right ventricle: The cavity size was normal. Systolic function   was mildly to moderately reduced. - Right atrium: The atrium was moderately dilated. - Tricuspid valve: Peak RV-RA gradient (S): 23 mm Hg. - Pulmonary arteries: PA peak pressure: 38 mm Hg (S). - Systemic veins: IVC measured 3.3 cm with < 50% respirophasic   variation, suggesting RA pressure 15 mmHg.  Impressions:  - The patient was in atrial fibrillation.  Mildly dilated LV with EF   20-25%, diffuse hypokinesis. Normal RV size with mild to   moderately decreased systolic function. Moderate mitral   regurgitation, probably functional. Mild pulmonary hypertension.   Dilated IVC suggestive of elevated RV filling pressure  Patient Profile     61 y.o. male with PMH of AF, NICM, asthma, and COPD who presented with dyspnea and fatigue. Found to be in AF RVR.  Assessment & Plan    1. AF RVR: Has hx of the same from 2016. Did not follow up. Unclear how long he has been in AF this time as he is generally asymptomatic.  HR is well controlled On Dilt 5 mg /hr IV  Will DC Dilt drip and increase metoprolol to 25 po BID .   2. Acute on chronic systolic HF: BNP elevated on admission.  Weight inaccurate.  -- monitor I&Os, daily weights. 2L UOP yesterday. Cr continues to rise. Will hold PM dose of lasix.  -- continue with IV lasix, monitor BMET -- echo with continued low EF. Important that he continues with good medical therapy and follow up.  He will see Dr. [name] at the [facility] office   3. AKI: Bump in Cr today with diuresis and addition of ACEi.  -- hold PM lasix.  Further management per IM   4. Mildly Elevated Trop: Flat non ACS trend in the setting of AF RVR.      Kristeen Miss, MD  09/17/2016 2:29 PM    [facility] [address] [place],  [address] [place], [place]  [address] Pager [phone] Phone: [phone]; Fax: [phone]

## 2016-09-17 NOTE — Progress Notes (Signed)
[facility]  PROGRESS NOTE  Kevin [name] ZOX:[id number] DOB: 1956-05-19 DOA: 09/15/2016 PCP: No primary care provider on file.   Brief HPI:    61 y.o. male with medical history of systolic CHF and COPD presents with 10 day history of dyspnea and orthopnea. The patient states that he normally walks 200 yards to his bus stop, but he has been having some chest discomfort and shortness of breath even making it halfway there for the past week. The patient was admitted to the hospital from 07/23/2015 through 07/28/2015 with acute systolic CHF. His discharge weight was 213 pounds. Unfortunately he was lost to follow-up and has not seen a physician in one year. He has not taken any of his cardiac medications in one year. He denies any fevers, chills, nausea, vomiting, diarrhea, abdominal pain, dysuria, hematuria, hematochezia, melena. He quit smoking approximately 3-4 years ago. He complains of orthopnea type symptoms but denies PND. He has had to sleep sitting up for the past 3-4 nights.  In the emergency department, the patient was noted to have atrial fibrillation with RVR with HR 140-150.  The patient was given a diltiazem bolus and started on a diltiazem drip. The patient was afebrile and hemodynamically stable with saturations of 97% on room air. Furosemide 40 mg IV was given. BNP was 687   Subjective   Patient seen and examined, denies shortness of breath.   Assessment/Plan:     1. Acute on chronic systolic CHF- patient's echo from 2016 showed EF 25-30%, moderate to severe MR. Continue IV Lasix 40 mg every 12 hours. Echocardiogram shows diffuse hypokinesis, EF 20-25%. Cardiology following. 2. Atrial fibrillation with RVR-patient started on Cardizem drip, Metoprolol 12.5 mg po bid, cardiology discussing anticoagulation options.CHA2DS2VASc score is 2. TSH normal. 3. Acute kidney injury- creatinine is 1.74, yesterday was 1.64, secondary to IV Lasix. Will follow BMP in a.m. 4. COPD-  stable. 5. Chest pain- resolved, has mild elevation of troponin. Likely from demand ischemia due to  A. fib with RVR    DVT prophylaxis: Lovenox  Code Status: Full code  Family Communication: No family present at bedside   Disposition Plan: Home when breathing improves   Consultants:  Cardiology  Procedures:  None   Continuous infusions . diltiazem (CARDIZEM) infusion 5 mg/hr (09/17/16 8119)      Antibiotics:   Anti-infectives    None       Objective   Vitals:   09/17/16 0500 09/17/16 0600 09/17/16 0800 09/17/16 0939  BP: 110/62 96/79 93/81  95/72  Pulse: (!) 116 (!) 104 (!) 112 (!) 107  Resp: 15 (!) 21 18   Temp:   98.3 F (36.8 C)   TempSrc:   Oral   SpO2: 98% 98% 98%   Weight:      Height:        Intake/Output Summary (Last 24 hours) at 09/17/16 1129 Last data filed at 09/17/16 0940  Gross per 24 hour  Intake           325.84 ml  Output             1900 ml  Net         -1574.16 ml   Filed Weights   09/15/16 1335 09/16/16 0400 09/17/16 0234  Weight: 106.4 kg (234 lb 9.1 oz) 102.3 kg (225 lb 8.5 oz) 103 kg (227 lb 1.2 oz)     Physical Examination:  General exam: Appears calm and comfortable. Respiratory system:  Respiratory effort normal.Bibasilar crackles. Cardiovascular system:  Irregular. No  murmurs, rubs, gallops. Bilateral 1+ pitting edema GI system: Abdomen is nondistended, soft and nontender. No organomegaly.  Central nervous system. No focal neurological deficits. 5 x 5 power in all extremities. Skin: No rashes, lesions or ulcers. Psychiatry: Alert, oriented x 3.Judgement and insight appear normal. Affect normal.    Data Reviewed: I have personally reviewed following labs and imaging studies  CBG: No results for input(s): GLUCAP in the last 168 hours.  CBC:  Recent Labs Lab 09/15/16 0939  WBC 9.1  NEUTROABS 6.1  HGB 13.2  HCT 39.8  MCV 81.9  PLT 209    Basic Metabolic Panel:  Recent Labs Lab 09/15/16 0939  09/16/16 0316 09/17/16 0403  NA 142 140 139  K 4.2 4.5 4.2  CL 109 106 104  CO2 27 27 28   GLUCOSE 136* 92 90  BUN 19 25* 31*  CREATININE 1.09 1.64* 1.74*  CALCIUM 9.0 8.8* 8.7*      Cardiac Enzymes:  Recent Labs Lab 09/15/16 1426 09/15/16 1937 09/16/16 0128  TROPONINI 0.05* 0.04* 0.04*   BNP (last 3 results)  Recent Labs  09/15/16 0939  BNP 687.7*    ProBNP (last 3 results) No results for input(s): PROBNP in the last 8760 hours.    Studies: No results found.  Scheduled Meds: . enoxaparin (LOVENOX) injection  40 mg Subcutaneous Q24H  . furosemide  40 mg Intravenous BID  . metoprolol tartrate  12.5 mg Oral BID  . pantoprazole  40 mg Oral Daily  . sodium chloride flush  3 mL Intravenous Q12H    Time spent: 25 min  [facility]   [facility] Pager [phone]. If 7PM-7AM, please contact night-coverage at [email or web address], Office  [phone]  password [email or web address] 09/17/2016, 11:29 AM  LOS: 2 days

## 2016-09-18 ENCOUNTER — Inpatient Hospital Stay (HOSPITAL_COMMUNITY): Payer: Self-pay

## 2016-09-18 LAB — CBC WITH DIFFERENTIAL/PLATELET
BASOS ABS: 0 10*3/uL (ref 0.0–0.1)
BASOS PCT: 0 %
EOS ABS: 0 10*3/uL (ref 0.0–0.7)
Eosinophils Relative: 0 %
HCT: 35.7 % — ABNORMAL LOW (ref 39.0–52.0)
Hemoglobin: 12.1 g/dL — ABNORMAL LOW (ref 13.0–17.0)
Lymphocytes Relative: 14 %
Lymphs Abs: 1.6 10*3/uL (ref 0.7–4.0)
MCH: 26.8 pg (ref 26.0–34.0)
MCHC: 33.9 g/dL (ref 30.0–36.0)
MCV: 79 fL (ref 78.0–100.0)
MONO ABS: 1.5 10*3/uL — AB (ref 0.1–1.0)
MONOS PCT: 13 %
Neutro Abs: 8.8 10*3/uL — ABNORMAL HIGH (ref 1.7–7.7)
Neutrophils Relative %: 73 %
PLATELETS: 210 10*3/uL (ref 150–400)
RBC: 4.52 MIL/uL (ref 4.22–5.81)
RDW: 14.1 % (ref 11.5–15.5)
WBC: 12 10*3/uL — ABNORMAL HIGH (ref 4.0–10.5)

## 2016-09-18 LAB — BASIC METABOLIC PANEL
ANION GAP: 8 (ref 5–15)
BUN: 31 mg/dL — AB (ref 6–20)
CALCIUM: 8.7 mg/dL — AB (ref 8.9–10.3)
CO2: 29 mmol/L (ref 22–32)
Chloride: 101 mmol/L (ref 101–111)
Creatinine, Ser: 1.66 mg/dL — ABNORMAL HIGH (ref 0.61–1.24)
GFR calc Af Amer: 49 mL/min — ABNORMAL LOW (ref >60)
GFR, EST NON AFRICAN AMERICAN: 43 mL/min — AB (ref >60)
GLUCOSE: 105 mg/dL — AB (ref 65–99)
Potassium: 4.4 mmol/L (ref 3.5–5.1)
SODIUM: 138 mmol/L (ref 135–145)

## 2016-09-18 LAB — LACTIC ACID, PLASMA: LACTIC ACID, VENOUS: 1.3 mmol/L (ref 0.5–1.9)

## 2016-09-18 NOTE — Progress Notes (Signed)
Appointment with [facility] 3/19 at 10:30 AM for PCP/hospital follow up was made. Coupons for both Eliquis and Xarelto are on pt's chart.  If pt keep appointment with [facility], he will able to purchase medications at [facility] and [facility].

## 2016-09-18 NOTE — Progress Notes (Signed)
[facility]  PROGRESS NOTE  Kevin [name] OLI:[id number] DOB: February 14, 1956 DOA: 09/15/2016 PCP: No primary care provider on file.   Brief HPI:    61 y.o. male with medical history of systolic CHF and COPD presents with 10 day history of dyspnea and orthopnea. The patient states that he normally walks 200 yards to his bus stop, but he has been having some chest discomfort and shortness of breath even making it halfway there for the past week. The patient was admitted to the hospital from 07/23/2015 through 07/28/2015 with acute systolic CHF. His discharge weight was 213 pounds. Unfortunately he was lost to follow-up and has not seen a physician in one year. He has not taken any of his cardiac medications in one year. He denies any fevers, chills, nausea, vomiting, diarrhea, abdominal pain, dysuria, hematuria, hematochezia, melena. He quit smoking approximately 3-4 years ago. He complains of orthopnea type symptoms but denies PND. He has had to sleep sitting up for the past 3-4 nights.  In the emergency department, the patient was noted to have atrial fibrillation with RVR with HR 140-150.  The patient was given a diltiazem bolus and started on a diltiazem drip. The patient was afebrile and hemodynamically stable with saturations of 97% on room air. Furosemide 40 mg IV was given. BNP was 687   Subjective   Patient seen and examined, denies shortness of breath.   Assessment/Plan:     1. Acute on chronic systolic CHF- patient's echo from 2016 showed EF 25-30%, moderate to severe MR. Continue IV Lasix 40 mg every 12 hours. Echocardiogram shows diffuse hypokinesis, EF 20-25%. Cardiology following. 2. Atrial fibrillation with RVR-patient wasInitially  started on Cardizem drip, which was stopped and patient started on metoprolol 25 mg by mouth twice a day. Heart rate is controlled , cardiology discussing anticoagulation options.CHA2DS2VASc score is 2. TSH normal. 3. Acute kidney injury- creatinine  is 1.66, yesterday was 1.74, secondary to IV Lasix. Will follow BMP in a.m. 4. COPD- stable. 5. Chest pain- resolved, has mild elevation of troponin. Likely from demand ischemia due to  A. fib with RVR    DVT prophylaxis: Lovenox  Code Status: Full code  Family Communication: No family present at bedside   Disposition Plan: Home when breathing improves   Consultants:  Cardiology  Procedures:  None   Continuous infusions     Antibiotics:   Anti-infectives    None       Objective   Vitals:   09/17/16 1230 09/17/16 2047 09/18/16 0601 09/18/16 0808  BP: 100/71 113/64 106/80 98/80  Pulse: 88 (!) 112 (!) 56 (!) 110  Resp: 18 18 18    Temp: 98.1 F (36.7 C) 97.8 F (36.6 C) 98.5 F (36.9 C)   TempSrc: Oral Oral Oral   SpO2: 99% 93% 95%   Weight:   101.3 kg (223 lb 4.8 oz)   Height:        Intake/Output Summary (Last 24 hours) at 09/18/16 1328 Last data filed at 09/18/16 1000  Gross per 24 hour  Intake             1040 ml  Output              925 ml  Net              115 ml   Filed Weights   09/16/16 0400 09/17/16 0234 09/18/16 0601  Weight: 102.3 kg (225 lb 8.5 oz) 103 kg (227 lb 1.2 oz) 101.3 kg (223  lb 4.8 oz)     Physical Examination:  General exam: Appears calm and comfortable. Respiratory system:  Respiratory effort normal.Decreased breath sounds bilaterally Cardiovascular system:  Irregular. No  murmurs, rubs, gallops. Bilateral 1+ pitting edema GI system: Abdomen is nondistended, soft and nontender. No organomegaly.  Central nervous system. No focal neurological deficits. 5 x 5 power in all extremities. Skin: No rashes, lesions or ulcers. Psychiatry: Alert, oriented x 3.Judgement and insight appear normal. Affect normal.    Data Reviewed: I have personally reviewed following labs and imaging studies  CBG: No results for input(s): GLUCAP in the last 168 hours.  CBC:  Recent Labs Lab 09/15/16 0939  WBC 9.1  NEUTROABS 6.1  HGB 13.2   HCT 39.8  MCV 81.9  PLT 209    Basic Metabolic Panel:  Recent Labs Lab 09/15/16 0939 09/16/16 0316 09/17/16 0403 09/18/16 0449  NA 142 140 139 138  K 4.2 4.5 4.2 4.4  CL 109 106 104 101  CO2 27 27 28 29   GLUCOSE 136* 92 90 105*  BUN 19 25* 31* 31*  CREATININE 1.09 1.64* 1.74* 1.66*  CALCIUM 9.0 8.8* 8.7* 8.7*      Cardiac Enzymes:  Recent Labs Lab 09/15/16 1426 09/15/16 1937 09/16/16 0128  TROPONINI 0.05* 0.04* 0.04*   BNP (last 3 results)  Recent Labs  09/15/16 0939  BNP 687.7*    ProBNP (last 3 results) No results for input(s): PROBNP in the last 8760 hours.    Studies: No results found.  Scheduled Meds: . enoxaparin (LOVENOX) injection  40 mg Subcutaneous Q24H  . furosemide  40 mg Intravenous BID  . metoprolol tartrate  25 mg Oral BID  . pantoprazole  40 mg Oral Daily  . sodium chloride flush  3 mL Intravenous Q12H    Time spent: 25 min  [facility]   [facility] Pager [phone]. If 7PM-7AM, please contact night-coverage at [email or web address], Office  [phone]  password [email or web address] 09/18/2016, 1:28 PM  LOS: 3 days

## 2016-09-18 NOTE — Progress Notes (Signed)
Progress Note  Patient Name: Kevin [name] Date of Encounter: 09/18/2016  Primary Cardiologist: Hilty   Subjective   61 yo with hx of nonischemic cardiomyopathy, chronic systolic CHF,  Atrial fib  Does not go to the doctor often. Gets his care typically at the hospital  Feeling well this morning. No chest pain, dyspnea has improved.      Inpatient Medications    Scheduled Meds: . enoxaparin (LOVENOX) injection  40 mg Subcutaneous Q24H  . furosemide  40 mg Intravenous BID  . metoprolol tartrate  25 mg Oral BID  . pantoprazole  40 mg Oral Daily  . sodium chloride flush  3 mL Intravenous Q12H   Continuous Infusions:  PRN Meds: sodium chloride, acetaminophen, albuterol, ondansetron (ZOFRAN) IV, sodium chloride flush, traMADol   Vital Signs    Vitals:   09/17/16 0939 09/17/16 1230 09/17/16 2047 09/18/16 0601  BP: 95/72 100/71 113/64 106/80  Pulse: (!) 107 88 (!) 112 (!) 56  Resp:  18 18 18   Temp:  98.1 F (36.7 C) 97.8 F (36.6 C) 98.5 F (36.9 C)  TempSrc:  Oral Oral Oral  SpO2:  99% 93% 95%  Weight:    223 lb 4.8 oz (101.3 kg)  Height:        Intake/Output Summary (Last 24 hours) at 09/18/16 0751 Last data filed at 09/18/16 0602  Gross per 24 hour  Intake           606.92 ml  Output             1625 ml  Net         -1018.08 ml   Filed Weights   09/16/16 0400 09/17/16 0234 09/18/16 0601  Weight: 225 lb 8.5 oz (102.3 kg) 227 lb 1.2 oz (103 kg) 223 lb 4.8 oz (101.3 kg)    Telemetry    AF (rates 100-120s) - Personally Reviewed  ECG       Physical Exam   General: Well developed, well nourished, AA male appearing in no acute distress. Head: Normocephalic, atraumatic.  Neck: Supple without bruits, JVD. Lungs:  Resp regular and unlabored, CTA. Heart: Irreg Irreg, S1, S2, no S3, S4, or murmur; no rub. Abdomen: Soft, non-tender, non-distended with normoactive bowel sounds. No hepatomegaly. No rebound/guarding. No obvious abdominal masses. Extremities:  No clubbing, cyanosis, 1+ bilateral LE edema. Distal pedal pulses are 2+ bilaterally. Neuro: Alert and oriented X 3. Moves all extremities spontaneously. Psych: Normal affect.  Labs    Chemistry  Recent Labs Lab 09/16/16 0316 09/17/16 0403 09/18/16 0449  NA 140 139 138  K 4.5 4.2 4.4  CL 106 104 101  CO2 27 28 29   GLUCOSE 92 90 105*  BUN 25* 31* 31*  CREATININE 1.64* 1.74* 1.66*  CALCIUM 8.8* 8.7* 8.7*  GFRNONAA 43* 40* 43*  GFRAA 50* 47* 49*  ANIONGAP 7 7 8      Hematology  Recent Labs Lab 09/15/16 0939  WBC 9.1  RBC 4.86  HGB 13.2  HCT 39.8  MCV 81.9  MCH 27.2  MCHC 33.2  RDW 14.6  PLT 209    Cardiac Enzymes  Recent Labs Lab 09/15/16 1426 09/15/16 1937 09/16/16 0128  TROPONINI 0.05* 0.04* 0.04*     Recent Labs Lab 09/15/16 0951  TROPIPOC 0.04     BNP  Recent Labs Lab 09/15/16 0939  BNP 687.7*     DDimer No results for input(s): DDIMER in the last 168 hours.    Radiology    No  results found.  Cardiac Studies   TTE: 09/16/16  Study Conclusions  - Left ventricle: The cavity size was mildly dilated. Wall   thickness was normal. Systolic function was severely reduced. The   estimated ejection fraction was in the range of 20% to 25%.   Diffuse hypokinesis. Indeterminant diastolic function (atrial   fibrillation). - Aortic valve: There was no stenosis. - Mitral valve: There was moderate regurgitation. - Left atrium: The atrium was severely dilated. - Right ventricle: The cavity size was normal. Systolic function   was mildly to moderately reduced. - Right atrium: The atrium was moderately dilated. - Tricuspid valve: Peak RV-RA gradient (S): 23 mm Hg. - Pulmonary arteries: PA peak pressure: 38 mm Hg (S). - Systemic veins: IVC measured 3.3 cm with < 50% respirophasic   variation, suggesting RA pressure 15 mmHg.  Impressions:  - The patient was in atrial fibrillation. Mildly dilated LV with EF   20-25%, diffuse hypokinesis.  Normal RV size with mild to   moderately decreased systolic function. Moderate mitral   regurgitation, probably functional. Mild pulmonary hypertension.   Dilated IVC suggestive of elevated RV filling pressure  Patient Profile     61 y.o. male with PMH of AF, NICM, asthma, and COPD who presented with dyspnea and fatigue. Found to be in AF RVR.  Assessment & Plan    1. AF RVR: Has hx of the same from 2016. Did not follow up.  HR is well controlled  continue metoprolol for rate control Titrate up as needed      2. Acute on chronic systolic HF: BNP elevated on admission.  Weight inaccurate.  -- monitor I&Os, daily weights. 2L UOP yesterday. Cr continues to rise. Will hold PM dose of lasix.  -- continue with IV lasix, monitor BMET -- echo with continued low EF. Important that he continues with good medical therapy and follow up.  He will see Dr. [name] at the [facility] office   3. AKI:  Further management per IM   4. Mildly Elevated Trop: Flat non ACS trend in the setting of AF RVR.   He should be able to go home soon      Kristeen Miss, MD  09/18/2016 7:51 AM    [name] [address] [place],  [address] [place], [place]  [address] Pager [phone] Phone: [phone]; Fax: [phone]

## 2016-09-19 LAB — BASIC METABOLIC PANEL
ANION GAP: 10 (ref 5–15)
BUN: 31 mg/dL — ABNORMAL HIGH (ref 6–20)
CALCIUM: 8.7 mg/dL — AB (ref 8.9–10.3)
CO2: 27 mmol/L (ref 22–32)
CREATININE: 1.54 mg/dL — AB (ref 0.61–1.24)
Chloride: 99 mmol/L — ABNORMAL LOW (ref 101–111)
GFR, EST AFRICAN AMERICAN: 54 mL/min — AB (ref >60)
GFR, EST NON AFRICAN AMERICAN: 47 mL/min — AB (ref >60)
GLUCOSE: 111 mg/dL — AB (ref 65–99)
Potassium: 4.1 mmol/L (ref 3.5–5.1)
Sodium: 136 mmol/L (ref 135–145)

## 2016-09-19 MED ORDER — APIXABAN 5 MG PO TABS
5.0000 mg | ORAL_TABLET | Freq: Two times a day (BID) | ORAL | Status: DC
  Administered 2016-09-19 – 2016-09-22 (×7): 5 mg via ORAL
  Filled 2016-09-19 (×7): qty 1

## 2016-09-19 MED ORDER — CARVEDILOL 6.25 MG PO TABS
6.2500 mg | ORAL_TABLET | Freq: Two times a day (BID) | ORAL | Status: DC
  Administered 2016-09-19 – 2016-09-20 (×3): 6.25 mg via ORAL
  Filled 2016-09-19 (×2): qty 1

## 2016-09-19 MED ORDER — METOPROLOL SUCCINATE ER 25 MG PO TB24: 25.0000 mg | ORAL_TABLET | Freq: Every day | ORAL | Status: DC

## 2016-09-19 MED ORDER — DIGOXIN 0.25 MG/ML IJ SOLN
0.2500 mg | Freq: Four times a day (QID) | INTRAMUSCULAR | Status: AC
  Administered 2016-09-19 (×3): 0.25 mg via INTRAVENOUS
  Filled 2016-09-19 (×9): qty 1

## 2016-09-19 NOTE — Progress Notes (Signed)
Subjective:    Some fatigue this AM  Objective:   Temp:  [97.9 F (36.6 C)-100.1 F (37.8 C)] 98.4 F (36.9 C) (02/24 0556) Pulse Rate:  [65-131] 65 (02/24 0556) Resp:  [18] 18 (02/24 0556) BP: (95-111)/(54-96) 104/71 (02/24 0556) SpO2:  [92 %-100 %] 97 % (02/24 0556) Weight:  [220 lb 7.4 oz (100 kg)] 220 lb 7.4 oz (100 kg) (02/24 0556) Last BM Date: 09/16/16  Filed Weights   09/17/16 0234 09/18/16 0601 09/19/16 0556  Weight: 227 lb 1.2 oz (103 kg) 223 lb 4.8 oz (101.3 kg) 220 lb 7.4 oz (100 kg)    Intake/Output Summary (Last 24 hours) at 09/19/16 0816 Last data filed at 09/19/16 0600  Gross per 24 hour  Intake             1460 ml  Output              700 ml  Net              760 ml    Telemetry: afib with elevated rates  Exam:  General: NAD  HEENT: sclera clear, throat clear  Resp: bilateral crackles  Cardiac: irreg, rate 110, no m/r/g GI: abdomen soft, NT, ND  MSK: 1-2+ bilateral LE edema  Neuro: no focal deficits  Psych: appropriate affect  Lab Results:  Basic Metabolic Panel:  Recent Labs Lab 09/17/16 0403 09/18/16 0449 09/19/16 0509  NA 139 138 136  K 4.2 4.4 4.1  CL 104 101 99*  CO2 28 29 27   GLUCOSE 90 105* 111*  BUN 31* 31* 31*  CREATININE 1.74* 1.66* 1.54*  CALCIUM 8.7* 8.7* 8.7*    Liver Function Tests: No results for input(s): AST, ALT, ALKPHOS, BILITOT, PROT, ALBUMIN in the last 168 hours.  CBC:  Recent Labs Lab 09/15/16 0939 09/18/16 2200  WBC 9.1 12.0*  HGB 13.2 12.1*  HCT 39.8 35.7*  MCV 81.9 79.0  PLT 209 210    Cardiac Enzymes:  Recent Labs Lab 09/15/16 1426 09/15/16 1937 09/16/16 0128  TROPONINI 0.05* 0.04* 0.04*    BNP: No results for input(s): PROBNP in the last 8760 hours.  Coagulation: No results for input(s): INR in the last 168 hours.  ECG:   Medications:   Scheduled Medications: . enoxaparin (LOVENOX) injection  40 mg Subcutaneous Q24H  . furosemide  40 mg Intravenous BID  .  metoprolol tartrate  25 mg Oral BID  . pantoprazole  40 mg Oral Daily  . sodium chloride flush  3 mL Intravenous Q12H     Infusions:   PRN Medications:  sodium chloride, acetaminophen, albuterol, ondansetron (ZOFRAN) IV, sodium chloride flush, traMADol     Assessment/Plan   1. Afib - rate controlled with lopressor 25mg  bid. Change to coreg 6.25mg  bid in setting of systolic dysfunction - CHADS2Vasc score is 3. There are concerns ability to obtain anticoagulation at home due to lack of insurance and poor transportation for INR checks. Case management has provided coupons for both eliquis and xarelto on his chart per there note. We will start eliquis 5mg  bid.  - rates remain elevated. Titrate beta blocker as tolerated. Will load with digoxin,conservative load given his renal function of 0.25mg  IV x 3 doses, convert to oral maintence tomorrow.   2. Acute on chronic systolic HF - NICM based on prior cath - echo 08/2016 LVEF 20-25%, moderate MR, mild to mod RV dysfunction, dilated IVC - +740mL yesterday, negative 2.4 liters since admission. He is  on lasix 40mg  IV bid. Downtrend in Cr. - in setting of systolic dysfunction change to coreg. Hold ACE/ARB/aldactone at this time due to soft bp's and borderline renal function - continue IV lasix today, still appears volume overloaded. Would not titrate up diuretic given recent AKI.       Dina Rich, M.D., F.A.C.C.Patient ID: Kevin [name], male   DOB: 1955/07/28, 61 y.o.   MRN: [id number]

## 2016-09-19 NOTE — Discharge Instructions (Signed)
Information on my medicine - ELIQUIS (apixaban)  This medication education was reviewed with me or my healthcare representative as part of my discharge preparation.  The pharmacist that spoke with me during my hospital stay was:  Otho Bellows, [facility]  Why was Eliquis prescribed for you? Eliquis was prescribed for you to reduce the risk of forming blood clots that can cause a stroke if you have a medical condition called atrial fibrillation (a type of irregular heartbeat) OR to reduce the risk of a blood clots forming after orthopedic surgery.  What do You need to know about Eliquis ? Take your Eliquis TWICE DAILY - one tablet in the morning and one tablet in the evening with or without food.  It would be best to take the doses about the same time each day.  If you have difficulty swallowing the tablet whole please discuss with your pharmacist how to take the medication safely.  Take Eliquis exactly as prescribed by your doctor and DO NOT stop taking Eliquis without talking to the doctor who prescribed the medication.  Stopping may increase your risk of developing a new clot or stroke.  Refill your prescription before you run out.  After discharge, you should have regular check-up appointments with your healthcare provider that is prescribing your Eliquis.  In the future your dose may need to be changed if your kidney function or weight changes by a significant amount or as you get older.  What do you do if you miss a dose? If you miss a dose, take it as soon as you remember on the same day and resume taking twice daily.  Do not take more than one dose of ELIQUIS at the same time.  Important Safety Information A possible side effect of Eliquis is bleeding. You should call your healthcare provider right away if you experience any of the following: ? Bleeding from an injury or your nose that does not stop. ? Unusual colored urine (red or dark brown) or unusual colored stools (red or  black). ? Unusual bruising for unknown reasons. ? A serious fall or if you hit your head (even if there is no bleeding).  Some medicines may interact with Eliquis and might increase your risk of bleeding or clotting while on Eliquis. To help avoid this, consult your healthcare provider or pharmacist prior to using any new prescription or non-prescription medications, including herbals, vitamins, non-steroidal anti-inflammatory drugs (NSAIDs) and supplements.  This website has more information on Eliquis (apixaban): [email or web address].cy.

## 2016-09-19 NOTE — Progress Notes (Signed)
[facility]  PROGRESS NOTE  Kevin [name] ZOX:[id number] DOB: Dec 15, 1955 DOA: 09/15/2016 PCP: No primary care provider on file.   Brief HPI:    61 y.o. male with medical history of systolic CHF and COPD presents with 10 day history of dyspnea and orthopnea. The patient states that he normally walks 200 yards to his bus stop, but he has been having some chest discomfort and shortness of breath even making it halfway there for the past week. The patient was admitted to the hospital from 07/23/2015 through 07/28/2015 with acute systolic CHF. His discharge weight was 213 pounds. Unfortunately he was lost to follow-up and has not seen a physician in one year. He has not taken any of his cardiac medications in one year. He denies any fevers, chills, nausea, vomiting, diarrhea, abdominal pain, dysuria, hematuria, hematochezia, melena. He quit smoking approximately 3-4 years ago. He complains of orthopnea type symptoms but denies PND. He has had to sleep sitting up for the past 3-4 nights.  In the emergency department, the patient was noted to have atrial fibrillation with RVR with HR 140-150.  The patient was given a diltiazem bolus and started on a diltiazem drip. The patient was afebrile and hemodynamically stable with saturations of 97% on room air. Furosemide 40 mg IV was given. BNP was 687   Subjective   Patient seen and examined, denies  chest pain or shortness of breath.   Assessment/Plan:     1. Acute on chronic systolic CHF- patient's echo from 2016 showed EF 25-30%, moderate to severe MR. Continue IV Lasix 40 mg every 12 hours. Echocardiogram shows diffuse hypokinesis, EF 20-25%. Cardiology following. 2. Atrial fibrillation with RVR-patient wasInitially  started on Cardizem drip, which was stopped and patient started on metoprolol 25 mg by mouth twice a day. Heart rate is controlled , patient started on Eliquis.CHA2DS2VASc score is 2. TSH normal. Also started on Digoxin 0.25 mg IV q 6  hrs. 3. Acute kidney injury- creatinine is 1.54, yesterday was 1.66, secondary to IV Lasix. Will follow BMP in a.m. 4. COPD- stable. 5. Chest pain- resolved, has mild elevation of troponin. Likely from demand ischemia due to  A. fib with RVR    DVT prophylaxis: Lovenox  Code Status: Full code  Family Communication: No family present at bedside   Disposition Plan: Home when breathing improves   Consultants:  Cardiology  Procedures:  None   Continuous infusions     Antibiotics:   Anti-infectives    None       Objective   Vitals:   09/18/16 2045 09/18/16 2223 09/19/16 0556 09/19/16 0935  BP: (!) 95/54 (!) 111/96 104/71 (!) 89/74  Pulse: (!) 128 (!) 131 65 87  Resp: 18 18 18 18   Temp: 100.1 F (37.8 C) 98.8 F (37.1 C) 98.4 F (36.9 C) 97.9 F (36.6 C)  TempSrc: Oral Oral Oral Oral  SpO2: 96% 100% 97% 97%  Weight:   100 kg (220 lb 7.4 oz)   Height:        Intake/Output Summary (Last 24 hours) at 09/19/16 1223 Last data filed at 09/19/16 1059  Gross per 24 hour  Intake             1445 ml  Output              600 ml  Net              845 ml   Filed Weights   09/17/16 0234 09/18/16 0601  09/19/16 0556  Weight: 103 kg (227 lb 1.2 oz) 101.3 kg (223 lb 4.8 oz) 100 kg (220 lb 7.4 oz)     Physical Examination:  General exam: Appears calm and comfortable. Respiratory system:  Respiratory effort normal.Decreased breath sounds bilaterally Cardiovascular system:  Irregular. No  murmurs, rubs, gallops. Bilateral 1+ pitting edema GI system: Abdomen is nondistended, soft and nontender. No organomegaly.  Central nervous system. No focal neurological deficits. 5 x 5 power in all extremities. Skin: No rashes, lesions or ulcers. Psychiatry: Alert, oriented x 3.Judgement and insight appear normal. Affect normal.    Data Reviewed: I have personally reviewed following labs and imaging studies  CBG: No results for input(s): GLUCAP in the last 168  hours.  CBC:  Recent Labs Lab 09/15/16 0939 09/18/16 2200  WBC 9.1 12.0*  NEUTROABS 6.1 8.8*  HGB 13.2 12.1*  HCT 39.8 35.7*  MCV 81.9 79.0  PLT 209 210    Basic Metabolic Panel:  Recent Labs Lab 09/15/16 0939 09/16/16 0316 09/17/16 0403 09/18/16 0449 09/19/16 0509  NA 142 140 139 138 136  K 4.2 4.5 4.2 4.4 4.1  CL 109 106 104 101 99*  CO2 27 27 28 29 27   GLUCOSE 136* 92 90 105* 111*  BUN 19 25* 31* 31* 31*  CREATININE 1.09 1.64* 1.74* 1.66* 1.54*  CALCIUM 9.0 8.8* 8.7* 8.7* 8.7*      Cardiac Enzymes:  Recent Labs Lab 09/15/16 1426 09/15/16 1937 09/16/16 0128  TROPONINI 0.05* 0.04* 0.04*   BNP (last 3 results)  Recent Labs  09/15/16 0939  BNP 687.7*    ProBNP (last 3 results) No results for input(s): PROBNP in the last 8760 hours.    Studies: Dg Chest Port 1 View  Result Date: 09/18/2016 CLINICAL DATA:  61 y/o M; fever, elevated blood pressure, and tachycardia. EXAM: PORTABLE CHEST 1 VIEW COMPARISON:  09/15/2016 chest radiograph FINDINGS: Stable moderate cardiomegaly. No focal consolidation, effusion, or pneumothorax. Bones are unremarkable. IMPRESSION: No active disease.  Stable moderate cardiomegaly. Electronically Signed   By: Mitzi Hansen M.D.   On: 09/18/2016 22:39    Scheduled Meds: . apixaban  5 mg Oral BID  . carvedilol  6.25 mg Oral BID WC  . digoxin  0.25 mg Intravenous Q6H  . furosemide  40 mg Intravenous BID  . pantoprazole  40 mg Oral Daily  . sodium chloride flush  3 mL Intravenous Q12H    Time spent: 25 min  [facility]   [facility] Pager [phone]. If 7PM-7AM, please contact night-coverage at [email or web address], Office  [phone]  password [email or web address] 09/19/2016, 12:23 PM  LOS: 4 days

## 2016-09-20 LAB — BASIC METABOLIC PANEL
ANION GAP: 10 (ref 5–15)
BUN: 29 mg/dL — ABNORMAL HIGH (ref 6–20)
CALCIUM: 8.7 mg/dL — AB (ref 8.9–10.3)
CHLORIDE: 97 mmol/L — AB (ref 101–111)
CO2: 33 mmol/L — AB (ref 22–32)
Creatinine, Ser: 1.42 mg/dL — ABNORMAL HIGH (ref 0.61–1.24)
GFR calc non Af Amer: 51 mL/min — ABNORMAL LOW (ref >60)
GFR, EST AFRICAN AMERICAN: 60 mL/min — AB (ref >60)
Glucose, Bld: 88 mg/dL (ref 65–99)
Potassium: 3.5 mmol/L (ref 3.5–5.1)
Sodium: 140 mmol/L (ref 135–145)

## 2016-09-20 MED ORDER — CARVEDILOL 3.125 MG PO TABS
9.3750 mg | ORAL_TABLET | Freq: Two times a day (BID) | ORAL | Status: DC
  Administered 2016-09-20 – 2016-09-22 (×5): 9.375 mg via ORAL
  Filled 2016-09-20 (×5): qty 1

## 2016-09-20 MED ORDER — DIGOXIN 125 MCG PO TABS
0.1250 mg | ORAL_TABLET | Freq: Every day | ORAL | Status: DC
  Administered 2016-09-20 – 2016-09-22 (×3): 0.125 mg via ORAL
  Filled 2016-09-20 (×3): qty 1

## 2016-09-20 NOTE — Progress Notes (Signed)
[facility]  PROGRESS NOTE  Kevin [name] HDQ:[id number] DOB: 25-Apr-1956 DOA: 09/15/2016 PCP: No primary care provider on file.   Brief HPI:    61 y.o. male with medical history of systolic CHF and COPD presents with 10 day history of dyspnea and orthopnea. The patient states that he normally walks 200 yards to his bus stop, but he has been having some chest discomfort and shortness of breath even making it halfway there for the past week. The patient was admitted to the hospital from 07/23/2015 through 07/28/2015 with acute systolic CHF. His discharge weight was 213 pounds. Unfortunately he was lost to follow-up and has not seen a physician in one year. He has not taken any of his cardiac medications in one year. He denies any fevers, chills, nausea, vomiting, diarrhea, abdominal pain, dysuria, hematuria, hematochezia, melena. He quit smoking approximately 3-4 years ago. He complains of orthopnea type symptoms but denies PND. He has had to sleep sitting up for the past 3-4 nights.  In the emergency department, the patient was noted to have atrial fibrillation with RVR with HR 140-150.  The patient was given a diltiazem bolus and started on a diltiazem drip. The patient was afebrile and hemodynamically stable with saturations of 97% on room air. Furosemide 40 mg IV was given. BNP was 687   Subjective   Patient seen and examined, denies  chest pain or shortness of breath.Leg swelling has improved.   Assessment/Plan:     1. Acute on chronic systolic CHF- patient's echo from 2016 showed EF 25-30%, moderate to severe MR. Continue IV Lasix 40 mg every 12 hours. Echocardiogram shows diffuse hypokinesis, EF 20-25%. Cardiology following. 2. Atrial fibrillation with RVR-patient wasInitially  started on Cardizem drip, which was stopped and patient started on metoprolol 25 mg by mouth twice a day. Heart rate is controlled , patient started on Eliquis.CHA2DS2VASc score is 2. TSH normal. Also started  on Digoxin 0.25 mg po daily. 3. Acute kidney injury- creatinine is Slowly coming down, today creatinine is1.42 yesterday was 1.54, Continue IV diuresis with Lasix. Will follow BMP in a.m. 4. COPD- stable. 5. Chest pain- resolved, has mild elevation of troponin. Likely from demand ischemia due to  A. fib with RVR    DVT prophylaxis: Lovenox  Code Status: Full code  Family Communication: No family present at bedside   Disposition Plan: Home when breathing improves   Consultants:  Cardiology  Procedures:  None   Continuous infusions     Antibiotics:   Anti-infectives    None       Objective   Vitals:   09/19/16 0935 09/19/16 1352 09/19/16 2114 09/20/16 0506  BP: (!) 89/74 105/71 103/80 109/78  Pulse: 87 (!) 116 87 96  Resp: 18 18 18 16   Temp: 97.9 F (36.6 C) 99.5 F (37.5 C) 99.1 F (37.3 C) 97.7 F (36.5 C)  TempSrc: Oral Oral Oral Oral  SpO2: 97% 97% 98% 96%  Weight:    99.6 kg (219 lb 9.3 oz)  Height:        Intake/Output Summary (Last 24 hours) at 09/20/16 1030 Last data filed at 09/20/16 0939  Gross per 24 hour  Intake              423 ml  Output             1250 ml  Net             -827 ml   Filed Weights   09/18/16  0601 09/19/16 0556 09/20/16 0506  Weight: 101.3 kg (223 lb 4.8 oz) 100 kg (220 lb 7.4 oz) 99.6 kg (219 lb 9.3 oz)     Physical Examination:  General exam: Appears calm and comfortable. Respiratory system:  Respiratory effort normal.Decreased breath sounds bilaterally Cardiovascular system:  Irregular. No  murmurs, rubs, gallops. Bilateral 1+ pitting edema GI system: Abdomen is nondistended, soft and nontender. No organomegaly.  Central nervous system. No focal neurological deficits. 5 x 5 power in all extremities. Skin: No rashes, lesions or ulcers. Psychiatry: Alert, oriented x 3.Judgement and insight appear normal. Affect normal.    Data Reviewed: I have personally reviewed following labs and imaging studies  CBG: No  results for input(s): GLUCAP in the last 168 hours.  CBC:  Recent Labs Lab 09/15/16 0939 09/18/16 2200  WBC 9.1 12.0*  NEUTROABS 6.1 8.8*  HGB 13.2 12.1*  HCT 39.8 35.7*  MCV 81.9 79.0  PLT 209 210    Basic Metabolic Panel:  Recent Labs Lab 09/16/16 0316 09/17/16 0403 09/18/16 0449 09/19/16 0509 09/20/16 0426  NA 140 139 138 136 140  K 4.5 4.2 4.4 4.1 3.5  CL 106 104 101 99* 97*  CO2 27 28 29 27  33*  GLUCOSE 92 90 105* 111* 88  BUN 25* 31* 31* 31* 29*  CREATININE 1.64* 1.74* 1.66* 1.54* 1.42*  CALCIUM 8.8* 8.7* 8.7* 8.7* 8.7*      Cardiac Enzymes:  Recent Labs Lab 09/15/16 1426 09/15/16 1937 09/16/16 0128  TROPONINI 0.05* 0.04* 0.04*   BNP (last 3 results)  Recent Labs  09/15/16 0939  BNP 687.7*    ProBNP (last 3 results) No results for input(s): PROBNP in the last 8760 hours.    Studies: Dg Chest Port 1 View  Result Date: 09/18/2016 CLINICAL DATA:  61 y/o M; fever, elevated blood pressure, and tachycardia. EXAM: PORTABLE CHEST 1 VIEW COMPARISON:  09/15/2016 chest radiograph FINDINGS: Stable moderate cardiomegaly. No focal consolidation, effusion, or pneumothorax. Bones are unremarkable. IMPRESSION: No active disease.  Stable moderate cardiomegaly. Electronically Signed   By: Mitzi Hansen M.D.   On: 09/18/2016 22:39    Scheduled Meds: . apixaban  5 mg Oral BID  . carvedilol  9.375 mg Oral BID WC  . digoxin  0.125 mg Oral Daily  . furosemide  40 mg Intravenous BID  . pantoprazole  40 mg Oral Daily  . sodium chloride flush  3 mL Intravenous Q12H    Time spent: 25 min  [facility]   [facility] Pager [phone]. If 7PM-7AM, please contact night-coverage at [email or web address], Office  [phone]  password [email or web address] 09/20/2016, 10:30 AM  LOS: 5 days

## 2016-09-20 NOTE — Progress Notes (Signed)
Subjective:    SOB improving.   Objective:   Temp:  [97.7 F (36.5 C)-99.5 F (37.5 C)] 97.7 F (36.5 C) (02/25 0506) Pulse Rate:  [87-116] 96 (02/25 0506) Resp:  [16-18] 16 (02/25 0506) BP: (89-109)/(71-80) 109/78 (02/25 0506) SpO2:  [96 %-98 %] 96 % (02/25 0506) Weight:  [219 lb 9.3 oz (99.6 kg)] 219 lb 9.3 oz (99.6 kg) (02/25 0506) Last BM Date: 09/18/16  Filed Weights   09/18/16 0601 09/19/16 0556 09/20/16 0506  Weight: 223 lb 4.8 oz (101.3 kg) 220 lb 7.4 oz (100 kg) 219 lb 9.3 oz (99.6 kg)    Intake/Output Summary (Last 24 hours) at 09/20/16 0800 Last data filed at 09/20/16 0507  Gross per 24 hour  Intake              662 ml  Output             1450 ml  Net             -788 ml    Telemetry: afib variable rates  Exam:  General: NAD  HEENT:sclera clear, throat clear  Resp: mild crackles bilateral bases  Cardiac: irreg, rate 110  EN:IDPOEUM soft, NT, ND  MSK: 1+ bilateral LE edema  Neuro: no focal deficits  Psych: appropriate affect  Lab Results:  Basic Metabolic Panel:  Recent Labs Lab 09/18/16 0449 09/19/16 0509 09/20/16 0426  NA 138 136 140  K 4.4 4.1 3.5  CL 101 99* 97*  CO2 29 27 33*  GLUCOSE 105* 111* 88  BUN 31* 31* 29*  CREATININE 1.66* 1.54* 1.42*  CALCIUM 8.7* 8.7* 8.7*    Liver Function Tests: No results for input(s): AST, ALT, ALKPHOS, BILITOT, PROT, ALBUMIN in the last 168 hours.  CBC:  Recent Labs Lab 09/15/16 0939 09/18/16 2200  WBC 9.1 12.0*  HGB 13.2 12.1*  HCT 39.8 35.7*  MCV 81.9 79.0  PLT 209 210    Cardiac Enzymes:  Recent Labs Lab 09/15/16 1426 09/15/16 1937 09/16/16 0128  TROPONINI 0.05* 0.04* 0.04*    BNP: No results for input(s): PROBNP in the last 8760 hours.  Coagulation: No results for input(s): INR in the last 168 hours.  ECG:   Medications:   Scheduled Medications: . apixaban  5 mg Oral BID  . carvedilol  6.25 mg Oral BID WC  . furosemide  40 mg Intravenous BID  .  pantoprazole  40 mg Oral Daily  . sodium chloride flush  3 mL Intravenous Q12H     Infusions:   PRN Medications:  sodium chloride, acetaminophen, albuterol, ondansetron (ZOFRAN) IV, sodium chloride flush, traMADol     Assessment/Plan   1. Afib - rate controlled with coreg 6.25mg  bid. Yesterday loaded with IV digoxin due to ongoing high rates, conservative load of 0.75 mg based on renal function. Rates somewhat improved after digoxin loading but still elevated. We will start oral 0.125mg  daily and follow rates. Try increasing coreg to 9.375mg  bid - CHADS2Vasc score is 3. There are concerns ability to obtain anticoagulation at home due to lack of insurance and poor transportation for INR checks. Case management has provided coupons for both eliquis and xarelto on his chart per there note. We started eliquis 5mg  bid yesterday    2. Acute on chronic systolic HF - NICM based on prior cath - echo 08/2016 LVEF 20-25%, moderate MR, mild to mod RV dysfunction, dilated IVC - neg 800 mL yesterday, negative 3.3 liters since admission. He is  on lasix 40mg  IV bid. Slow downtrend in Cr. - Hold ACE/ARB/aldactone at this time due to soft bp's and borderline renal function - continue IV lasix today, still appears volume overloaded. Would not titrate up diuretic given recent AKI.         Dina Rich, M.D.

## 2016-09-21 DIAGNOSIS — R0602 Shortness of breath: Secondary | ICD-10-CM

## 2016-09-21 LAB — BASIC METABOLIC PANEL
ANION GAP: 7 (ref 5–15)
BUN: 27 mg/dL — ABNORMAL HIGH (ref 6–20)
CO2: 35 mmol/L — AB (ref 22–32)
Calcium: 9 mg/dL (ref 8.9–10.3)
Chloride: 101 mmol/L (ref 101–111)
Creatinine, Ser: 1.18 mg/dL (ref 0.61–1.24)
GFR calc Af Amer: >60 mL/min (ref >60)
GFR calc non Af Amer: >60 mL/min (ref >60)
GLUCOSE: 88 mg/dL (ref 65–99)
POTASSIUM: 3.7 mmol/L (ref 3.5–5.1)
Sodium: 143 mmol/L (ref 135–145)

## 2016-09-21 NOTE — Progress Notes (Signed)
Progress Note  Patient Name: Kevin [name] Date of Encounter: 09/21/2016  Primary Cardiologist: Dr. [name]   Subjective   Feels ok today. Breathing much improved. No supplemental O2 requirements. Denies orthopnea/ PNA. No CP. He feels occasional palpitations.   Inpatient Medications    Scheduled Meds: . apixaban  5 mg Oral BID  . carvedilol  9.375 mg Oral BID WC  . digoxin  0.125 mg Oral Daily  . furosemide  40 mg Intravenous BID  . pantoprazole  40 mg Oral Daily  . sodium chloride flush  3 mL Intravenous Q12H   Continuous Infusions:  PRN Meds: sodium chloride, acetaminophen, albuterol, ondansetron (ZOFRAN) IV, sodium chloride flush, traMADol   Vital Signs    Vitals:   09/20/16 1500 09/20/16 1758 09/20/16 2047 09/21/16 0526  BP: 100/78 111/87 107/74 100/65  Pulse:  (!) 105 (!) 55 (!) 53  Resp:   16 16  Temp:   99.1 F (37.3 C) 98.1 F (36.7 C)  TempSrc:   Oral Oral  SpO2:   99% 95%  Weight:    209 lb 3.5 oz (94.9 kg)  Height:        Intake/Output Summary (Last 24 hours) at 09/21/16 0855 Last data filed at 09/20/16 2049  Gross per 24 hour  Intake              480 ml  Output             1050 ml  Net             -570 ml   Filed Weights   09/19/16 0556 09/20/16 0506 09/21/16 0526  Weight: 220 lb 7.4 oz (100 kg) 219 lb 9.3 oz (99.6 kg) 209 lb 3.5 oz (94.9 kg)    Telemetry    Atrial fibrillation w/ Vrates in the 110s- Personally Reviewed  ECG    Atrial fibrillation - Personally Reviewed  Physical Exam   GEN: No acute distress.  Dentition in poor repair Neck: No JVD Cardiac: irregularlly irregular, tachy rate, no murmurs, rubs, or gallops.  Respiratory: decreased BS at the bases bilaterally. GI: Soft, nontender, non-distended  MS: No edema; No deformity. Neuro:  Nonfocal  Psych: Normal affect   Labs    Chemistry Recent Labs Lab 09/19/16 0509 09/20/16 0426 09/21/16 0432  NA 136 140 143  K 4.1 3.5 3.7  CL 99* 97* 101  CO2 27 33* 35*    GLUCOSE 111* 88 88  BUN 31* 29* 27*  CREATININE 1.54* 1.42* 1.18  CALCIUM 8.7* 8.7* 9.0  GFRNONAA 47* 51* >60  GFRAA 54* 60* >60  ANIONGAP 10 10 7      Hematology Recent Labs Lab 09/15/16 0939 09/18/16 2200  WBC 9.1 12.0*  RBC 4.86 4.52  HGB 13.2 12.1*  HCT 39.8 35.7*  MCV 81.9 79.0  MCH 27.2 26.8  MCHC 33.2 33.9  RDW 14.6 14.1  PLT 209 210    Cardiac Enzymes Recent Labs Lab 09/15/16 1426 09/15/16 1937 09/16/16 0128  TROPONINI 0.05* 0.04* 0.04*    Recent Labs Lab 09/15/16 0951  TROPIPOC 0.04     BNP Recent Labs Lab 09/15/16 0939  BNP 687.7*     DDimer No results for input(s): DDIMER in the last 168 hours.   Radiology    No results found.  Cardiac Studies   2D Echo 09/16/16 Study Conclusions  - Left ventricle: The cavity size was mildly dilated. Wall   thickness was normal. Systolic function was severely reduced. The  estimated ejection fraction was in the range of 20% to 25%.   Diffuse hypokinesis. Indeterminant diastolic function (atrial   fibrillation). - Aortic valve: There was no stenosis. - Mitral valve: There was moderate regurgitation. - Left atrium: The atrium was severely dilated. - Right ventricle: The cavity size was normal. Systolic function   was mildly to moderately reduced. - Right atrium: The atrium was moderately dilated. - Tricuspid valve: Peak RV-RA gradient (S): 23 mm Hg. - Pulmonary arteries: PA peak pressure: 38 mm Hg (S). - Systemic veins: IVC measured 3.3 cm with < 50% respirophasic   variation, suggesting RA pressure 15 mmHg.  Impressions:  - The patient was in atrial fibrillation. Mildly dilated LV with EF   20-25%, diffuse hypokinesis. Normal RV size with mild to   moderately decreased systolic function. Moderate mitral   regurgitation, probably functional. Mild pulmonary hypertension.   Dilated IVC suggestive of elevated RV filling pressure.  Patient Profile     61 yo male with PMH of AF, NICM, asthma,  and COPD who presented with dyspnea and fatigue. Found to be in AF RVR.   Assessment & Plan    1. Atrial Fibrillation: rate still elevated in the 110s, however systolic BPs have been soft, limiting titration of rate control agents. Continue rate control with BB. Coreg given systolic HF. No Cardizem given reduced EF. Also on digoxin. Monitor on telemetry and further increase his Coreg as BP allows. He also remains volume overloaded. Hopefully, his rate will also improve some with diuresis. CHA2DS2 VASc score is 3. He has been started on Eliquis for a/c, 5 mg BID.   2. Acute on Chronic Systolic CHF: NICM based on prior cath. 2D echo 09/16/16 showed LVEF of 20-25% with moderate MR and mild to moderate RV with dilated IVC. Presented with volume overload. Diuresing with IV Lasix. Unsure if daily weights or I/Os are accurate. Weight change from 234 lb>>209 lb since admit however I/Os only -3.8 L total. Pt admits not being fully compliant with using designated urinal for urine collection. Pt encouraged to use urinal to help keep track of I/Os. He remains volume overloaded with 2+ bilateral pitting LEE on exam. Renal function and K both stable. BP is soft but stable. Continue IV Lasix, Coreg and digoxin. Currently not on an ACE/ARB given issues with soft BP and renal insufficiency at time of admit. Scr now improved. Can consider low dose ACE-I, prior to discharge, if BP allows, but would first push BB given need for better rate control. Low sodium diet. Strict I/Os and daily weights.    Signed, Robbie Lis, PA-C  09/21/2016, 8:55 AM    I have examined the patient and reviewed assessment and plan and discussed with patient.  Agree with above as stated.   Rate control better today.  WOuld increase Coreg as much as BP tolerates before adding ACE-I.  He appears comfortable.  He is hoping to go home tomorrow.  Will need to minimize salt intake as well.    Lance Muss

## 2016-09-21 NOTE — Progress Notes (Signed)
[facility]  PROGRESS NOTE  Kevin [name] LSL:[id number] DOB: [birth year] DOA: 09/15/2016 PCP: No primary care provider on file.   Brief HPI:    61 y.o. male with medical history of systolic CHF and COPD presents with 10 day history of dyspnea and orthopnea. The patient states that he normally walks 200 yards to his bus stop, but he has been having some chest discomfort and shortness of breath even making it halfway there for the past week. The patient was admitted to the hospital from 07/23/2015 through 07/28/2015 with acute systolic CHF. His discharge weight was 213 pounds. Unfortunately he was lost to follow-up and has not seen a physician in one year. He has not taken any of his cardiac medications in one year. He denies any fevers, chills, nausea, vomiting, diarrhea, abdominal pain, dysuria, hematuria, hematochezia, melena. He quit smoking approximately 3-4 years ago. He complains of orthopnea type symptoms but denies PND. He has had to sleep sitting up for the past 3-4 nights.  In the emergency department, the patient was noted to have atrial fibrillation with RVR with HR 140-150.  The patient was given a diltiazem bolus and started on a diltiazem drip. The patient was afebrile and hemodynamically stable with saturations of 97% on room air. Furosemide 40 mg IV was given. BNP was 687   Subjective   Patient seen and examined, denies  chest pain or shortness of breath.Leg swelling has improved. He is still on IV Lasix.   Assessment/Plan:     1. Acute on chronic systolic CHF-  Improved, net -3.8 L patient's echo from 2016 showed EF 25-30%, moderate to severe MR. Continue IV Lasix 40 mg every 12 hours. Echocardiogram shows diffuse hypokinesis, EF 20-25%. Cardiology following. 2. Atrial fibrillation with RVR-patient wasInitially  started on Cardizem drip, which was stopped and patient started on metoprolol 25 mg by mouth twice a day. Heart rate is controlled , patient started on  Eliquis.CHA2DS2VASc score is 2. TSH normal. Also started on Digoxin 0.25 mg po daily. 3. Acute kidney injury- creatinine is Slowly coming down, today creatinine is 1.18, yesterday was 1.42, Continue IV diuresis with Lasix. Will follow BMP in a.m. 4. COPD- stable. 5. Chest pain- resolved, has mild elevation of troponin. Likely from demand ischemia due to  A. fib with RVR    DVT prophylaxis: Lovenox  Code Status: Full code  Family Communication: No family present at bedside   Disposition Plan: Home when OK with Cardiology   Consultants:  Cardiology  Procedures:  None   Continuous infusions     Antibiotics:   Anti-infectives    None       Objective   Vitals:   09/20/16 1758 09/20/16 2047 09/21/16 0526 09/21/16 1036  BP: 111/87 107/74 100/65 110/68  Pulse: (!) 105 (!) 55 (!) 53 (!) 112  Resp:  16 16   Temp:  99.1 F (37.3 C) 98.1 F (36.7 C)   TempSrc:  Oral Oral   SpO2:  99% 95%   Weight:   94.9 kg (209 lb 3.5 oz)   Height:        Intake/Output Summary (Last 24 hours) at 09/21/16 1250 Last data filed at 09/20/16 2049  Gross per 24 hour  Intake              240 ml  Output              650 ml  Net             -  410 ml   Filed Weights   09/19/16 0556 09/20/16 0506 09/21/16 0526  Weight: 100 kg (220 lb 7.4 oz) 99.6 kg (219 lb 9.3 oz) 94.9 kg (209 lb 3.5 oz)     Physical Examination:  General exam: Appears calm and comfortable. Respiratory system:  Respiratory effort normal.Decreased breath sounds bilaterally Cardiovascular system:  Irregular. No  murmurs, rubs, gallops. Bilateral 1+ pitting edema GI system: Abdomen is nondistended, soft and nontender. No organomegaly.  Central nervous system. No focal neurological deficits. 5 x 5 power in all extremities. Skin: No rashes, lesions or ulcers. Psychiatry: Alert, oriented x 3.Judgement and insight appear normal. Affect normal.    Data Reviewed: I have personally reviewed following labs and imaging  studies  CBG: No results for input(s): GLUCAP in the last 168 hours.  CBC:  Recent Labs Lab 09/15/16 0939 09/18/16 2200  WBC 9.1 12.0*  NEUTROABS 6.1 8.8*  HGB 13.2 12.1*  HCT 39.8 35.7*  MCV 81.9 79.0  PLT 209 210    Basic Metabolic Panel:  Recent Labs Lab 09/17/16 0403 09/18/16 0449 09/19/16 0509 09/20/16 0426 09/21/16 0432  NA 139 138 136 140 143  K 4.2 4.4 4.1 3.5 3.7  CL 104 101 99* 97* 101  CO2 28 29 27  33* 35*  GLUCOSE 90 105* 111* 88 88  BUN 31* 31* 31* 29* 27*  CREATININE 1.74* 1.66* 1.54* 1.42* 1.18  CALCIUM 8.7* 8.7* 8.7* 8.7* 9.0      Cardiac Enzymes:  Recent Labs Lab 09/15/16 1426 09/15/16 1937 09/16/16 0128  TROPONINI 0.05* 0.04* 0.04*   BNP (last 3 results)  Recent Labs  09/15/16 0939  BNP 687.7*    ProBNP (last 3 results) No results for input(s): PROBNP in the last 8760 hours.    Studies: No results found.  Scheduled Meds: . apixaban  5 mg Oral BID  . carvedilol  9.375 mg Oral BID WC  . digoxin  0.125 mg Oral Daily  . furosemide  40 mg Intravenous BID  . pantoprazole  40 mg Oral Daily  . sodium chloride flush  3 mL Intravenous Q12H    Time spent: 25 min  [facility]   [facility] Pager [phone]. If 7PM-7AM, please contact night-coverage at [email or web address], Office  [phone]  password [email or web address] 09/21/2016, 12:50 PM  LOS: 6 days

## 2016-09-22 LAB — BASIC METABOLIC PANEL
ANION GAP: 10 (ref 5–15)
BUN: 24 mg/dL — ABNORMAL HIGH (ref 6–20)
CALCIUM: 9.2 mg/dL (ref 8.9–10.3)
CHLORIDE: 97 mmol/L — AB (ref 101–111)
CO2: 34 mmol/L — AB (ref 22–32)
Creatinine, Ser: 1.21 mg/dL (ref 0.61–1.24)
GFR calc non Af Amer: >60 mL/min (ref >60)
Glucose, Bld: 96 mg/dL (ref 65–99)
Potassium: 3.5 mmol/L (ref 3.5–5.1)
Sodium: 141 mmol/L (ref 135–145)

## 2016-09-22 MED ORDER — CARVEDILOL 12.5 MG PO TABS: 12.5000 mg | ORAL_TABLET | Freq: Two times a day (BID) | ORAL | 2 refills | Status: DC

## 2016-09-22 MED ORDER — FUROSEMIDE 40 MG PO TABS: 40.0000 mg | ORAL_TABLET | Freq: Two times a day (BID) | ORAL | 2 refills | Status: DC

## 2016-09-22 MED ORDER — FUROSEMIDE 40 MG PO TABS
40.0000 mg | ORAL_TABLET | Freq: Two times a day (BID) | ORAL | Status: DC
  Administered 2016-09-22: 40 mg via ORAL
  Filled 2016-09-22: qty 1

## 2016-09-22 MED ORDER — CARVEDILOL 12.5 MG PO TABS
12.5000 mg | ORAL_TABLET | Freq: Two times a day (BID) | ORAL | Status: DC
  Administered 2016-09-22: 12.5 mg via ORAL
  Filled 2016-09-22: qty 1

## 2016-09-22 MED ORDER — POTASSIUM CHLORIDE CRYS ER 20 MEQ PO TBCR: 20.0000 meq | EXTENDED_RELEASE_TABLET | Freq: Every day | ORAL | 2 refills | Status: DC

## 2016-09-22 MED ORDER — DIGOXIN 125 MCG PO TABS: 0.1250 mg | ORAL_TABLET | Freq: Every day | ORAL | 2 refills | Status: DC

## 2016-09-22 MED ORDER — APIXABAN 5 MG PO TABS: 5.0000 mg | ORAL_TABLET | Freq: Two times a day (BID) | ORAL | 1 refills | Status: DC

## 2016-09-22 MED ORDER — POTASSIUM CHLORIDE CRYS ER 20 MEQ PO TBCR
20.0000 meq | EXTENDED_RELEASE_TABLET | Freq: Once | ORAL | Status: AC
  Administered 2016-09-22: 20 meq via ORAL
  Filled 2016-09-22: qty 1

## 2016-09-22 NOTE — Discharge Summary (Addendum)
Physician Discharge Summary  Kevin [name] WIO:[id number] DOB: April 25, 1956 DOA: 09/15/2016  PCP: No primary care provider on file.  Admit date: 09/15/2016 Discharge date: 09/22/2016  Time spent: 25* minutes  Recommendations for Outpatient Follow-up:  1. Follow up PCP in  2 weeks 2. Follow up Cardiology in one week 3.    Discharge Diagnoses:  Active Problems:   Atrial fibrillation with RVR (HCC)   PAF (paroxysmal atrial fibrillation) (HCC)   Nonischemic cardiomyopathy (HCC)   Coronary artery disease involving native coronary artery of native heart without angina pectoris   Acute on chronic systolic CHF (congestive heart failure) (HCC)   Discharge Condition: Stable  Diet recommendation: Low salt diet  Filed Weights   09/20/16 0506 09/21/16 0526 09/22/16 0523  Weight: 99.6 kg (219 lb 9.3 oz) 94.9 kg (209 lb 3.5 oz) 94.2 kg (207 lb 10.8 oz)    History of present illness:  61 y.o.malewith medical history of systolic CHF and COPD presents with 10 day history of dyspnea and orthopnea. The patient states that he normally walks 200 yards to his bus stop, but he has been having some chest discomfort and shortness of breath even making it halfway there for the past week. The patient was admitted to the hospital from 07/23/2015 through 07/28/2015 with acute systolic CHF. His discharge weight was 213 pounds. Unfortunately he was lost to follow-up and has not seen a physician in one year. He has not taken any of his cardiac medications in one year. He denies any fevers, chills, nausea, vomiting, diarrhea, abdominal pain, dysuria, hematuria, hematochezia, melena. He quit smoking approximately 3-4 years ago. He complains of orthopnea type symptoms but denies PND. He has had to sleep sitting up for the past 3-4 nights.  In the emergency department, the patient was noted to have atrial fibrillation with RVR with HR 140-150. The patient was given a diltiazem bolus and started on a diltiazem drip. The  patient was afebrile and hemodynamically stable with saturations of 97% on room air. Furosemide 40 mg IV was given. BNP was 687   Hospital Course:  1. Acute on chronic systolic CHF-  Improved, net -4.5 L patient's echo from 2016 showed EF 25-30%, moderate to severe MR. Was started on  IV Lasix 40 mg every 12 hours. Echocardiogram shows diffuse hypokinesis, EF 20-25%. Cardiology recommends to discharge patient on lasix 40 mg po BID. 2. Atrial fibrillation with RVR-patient wasInitially  started on Cardizem drip, which was stopped and patient started on metoprolol 25 mg by mouth twice a day. Heart rate is controlled , patient started on Eliquis.CHA2DS2VASc score is 2. TSH normal. Also started on Digoxin 0.25 mg po daily. Coreg dose increased to 12.5 mg po bid 3. Acute kidney injury- creatinine improved with aggressive diuresis, today creatinine is 1.21, will need repeat BMP in one week at cardiology office 4. COPD- stable. 5. Chest pain- resolved, has mild elevation of troponin. Likely from demand ischemia due to  A. fib with RVR   Procedures:  None   Consultations:  Cardiology   Discharge Exam: Vitals:   09/22/16 0805 09/22/16 1342  BP: 110/68 104/77  Pulse: 92 64  Resp:  18  Temp: 98.1 F (36.7 C) 98 F (36.7 C)    General: Appears in no acute distress Cardiovascular: RRR, S1S2 Respiratory: Clear bilaterally  Discharge Instructions   Discharge Instructions    Diet - low sodium heart healthy    Complete by:  As directed    Increase activity slowly  Complete by:  As directed      Current Discharge Medication List    START taking these medications   Details  apixaban (ELIQUIS) 5 MG TABS tablet Take 1 tablet (5 mg total) by mouth 2 (two) times daily. Qty: 60 tablet, Refills: 1    carvedilol (COREG) 12.5 MG tablet Take 1 tablet (12.5 mg total) by mouth 2 (two) times daily with a meal. Qty: 60 tablet, Refills: 2    digoxin (LANOXIN) 0.125 MG tablet Take 1 tablet  (0.125 mg total) by mouth daily. Qty: 30 tablet, Refills: 2    furosemide (LASIX) 40 MG tablet Take 1 tablet (40 mg total) by mouth 2 (two) times daily. Qty: 60 tablet, Refills: 2   Potassium 20 meq po daily     CONTINUE these medications which have NOT CHANGED   Details  albuterol (PROVENTIL HFA;VENTOLIN HFA) 108 (90 Base) MCG/ACT inhaler Inhale 2 puffs into the lungs every 6 (six) hours as needed for wheezing or shortness of breath. Qty: 1 Inhaler, Refills: 3    lansoprazole (PREVACID) 30 MG capsule Take 30 mg by mouth daily as needed (for heartburn/indigestion).       No Known Allergies Follow-up Information    [facility] Follow up on 10/12/2016.   Why:  Please keep this appointment. Take with you discharge papers, and all medications that you are taking with you. Appointment 3/19 at 10:30 AM.  Contact information: [address] [place] [address]       Lance Muss, MD Follow up in 1 week(s).   Specialties:  Cardiology, Radiology, Interventional Cardiology Contact information: [address] [place] [address] [phone]            The results of significant diagnostics from this hospitalization (including imaging, microbiology, ancillary and laboratory) are listed below for reference.    Significant Diagnostic Studies: Dg Chest Port 1 View  Result Date: 09/18/2016 CLINICAL DATA:  61 y/o M; fever, elevated blood pressure, and tachycardia. EXAM: PORTABLE CHEST 1 VIEW COMPARISON:  09/15/2016 chest radiograph FINDINGS: Stable moderate cardiomegaly. No focal consolidation, effusion, or pneumothorax. Bones are unremarkable. IMPRESSION: No active disease.  Stable moderate cardiomegaly. Electronically Signed   By: Mitzi Hansen M.D.   On: 09/18/2016 22:39   Dg Chest Port 1 View  Result Date: 09/15/2016 CLINICAL DATA:  Worsening shortness of breath over the last few days, history of asthma and  atrial fibrillation EXAM: PORTABLE CHEST 1 VIEW COMPARISON:  Chest x-ray of 07/23/2015 and CT angio chest of the same day FINDINGS: Moderate cardiomegaly is stable. Somewhat prominent interstitial markings remain throughout the lungs but no definite pneumonia or effusion is seen. No definite congestive heart failure is evident. The bony thorax appears intact with degenerative changes present in the shoulders. IMPRESSION: Stable moderate cardiomegaly. No definite active process. Slight prominence of interstitial markings. Electronically Signed   By: Dwyane Dee M.D.   On: 09/15/2016 10:01    Microbiology: Recent Results (from the past 240 hour(s))  Urine culture     Status: None   Collection Time: 09/15/16  8:44 PM  Result Value Ref Range Status   Specimen Description URINE, CLEAN CATCH  Final   Special Requests NONE  Final   Culture   Final    NO GROWTH Performed at [facility], [address], [place], [place] [address]    Report Status 09/17/2016 FINAL  Final  Culture, blood (routine x 2)  Status: None (Preliminary result)   Collection Time: 09/18/16 10:00 PM  Result Value Ref Range Status   Specimen Description BLOOD RIGHT ANTECUBITAL  Final   Special Requests IN PEDIATRIC BOTTLE 3CC  Final   Culture   Final    NO GROWTH 4 DAYS Performed at [facility], [address] [place], [place] [address]    Report Status PENDING  Incomplete  Culture, blood (routine x 2)     Status: None (Preliminary result)   Collection Time: 09/18/16 10:00 PM  Result Value Ref Range Status   Specimen Description BLOOD RIGHT HAND  Final   Special Requests IN PEDIATRIC BOTTLE 3CC  Final   Culture   Final    NO GROWTH 4 DAYS Performed at [facility], [address], [place], [place] [address]    Report Status PENDING  Incomplete     Labs: Basic Metabolic Panel:  Recent Labs Lab 09/18/16 0449 09/19/16 0509 09/20/16 0426 09/21/16 0432 09/22/16 0445  NA 138 136 140 143 141   K 4.4 4.1 3.5 3.7 3.5  CL 101 99* 97* 101 97*  CO2 29 27 33* 35* 34*  GLUCOSE 105* 111* 88 88 96  BUN 31* 31* 29* 27* 24*  CREATININE 1.66* 1.54* 1.42* 1.18 1.21  CALCIUM 8.7* 8.7* 8.7* 9.0 9.2   Liver Function Tests: No results for input(s): AST, ALT, ALKPHOS, BILITOT, PROT, ALBUMIN in the last 168 hours. No results for input(s): LIPASE, AMYLASE in the last 168 hours. No results for input(s): AMMONIA in the last 168 hours. CBC:  Recent Labs Lab 09/18/16 2200  WBC 12.0*  NEUTROABS 8.8*  HGB 12.1*  HCT 35.7*  MCV 79.0  PLT 210   Cardiac Enzymes:  Recent Labs Lab 09/15/16 1426 09/15/16 1937 09/16/16 0128  TROPONINI 0.05* 0.04* 0.04*   BNP: BNP (last 3 results)  Recent Labs  09/15/16 0939  BNP 687.7*    ProBNP (last 3 results) No results for input(s): PROBNP in the last 8760 hours.  CBG: No results for input(s): GLUCAP in the last 168 hours.     SignedMeredeth Ide MD.  [facility] 09/22/2016, 1:52 PM

## 2016-09-22 NOTE — Progress Notes (Signed)
Progress Note  Patient Name: Kevin [name] Date of Encounter: 09/22/2016  Primary Cardiologist: Dr. [name]   Subjective    Feels well today. Wants to go home soon.   Inpatient Medications    Scheduled Meds: . apixaban  5 mg Oral BID  . carvedilol  9.375 mg Oral BID WC  . digoxin  0.125 mg Oral Daily  . furosemide  40 mg Intravenous BID  . pantoprazole  40 mg Oral Daily  . sodium chloride flush  3 mL Intravenous Q12H   Continuous Infusions:  PRN Meds: sodium chloride, acetaminophen, albuterol, ondansetron (ZOFRAN) IV, sodium chloride flush, traMADol   Vital Signs    Vitals:   09/21/16 1458 09/21/16 2126 09/22/16 0523 09/22/16 0805  BP: 115/86 117/79 108/82 110/68  Pulse: 64 95 (!) 101 92  Resp: 18 18 18    Temp: 98.2 F (36.8 C) 98.2 F (36.8 C) 98.5 F (36.9 C) 98.1 F (36.7 C)  TempSrc: Oral Oral Oral Oral  SpO2: 97% 99% 99%   Weight:   207 lb 10.8 oz (94.2 kg)   Height:        Intake/Output Summary (Last 24 hours) at 09/22/16 0951 Last data filed at 09/22/16 0847  Gross per 24 hour  Intake             1326 ml  Output             1020 ml  Net              306 ml   Filed Weights   09/20/16 0506 09/21/16 0526 09/22/16 0523  Weight: 219 lb 9.3 oz (99.6 kg) 209 lb 3.5 oz (94.9 kg) 207 lb 10.8 oz (94.2 kg)    Telemetry    Atrial fibrillation w/ Vrates in the 110s- Personally Reviewed  ECG    Atrial fibrillation - Personally Reviewed  Physical Exam   GEN: No acute distress.  Dentition in poor repair Neck: No JVD Cardiac: irregularlly irregular, tachy rate, no murmurs, rubs, or gallops.  Respiratory: decreased BS at the bases bilaterally. GI: Soft, nontender, non-distended  MS: No edema; No deformity. Neuro:  Nonfocal  Psych: Normal affect   Labs    Chemistry  Recent Labs Lab 09/20/16 0426 09/21/16 0432 09/22/16 0445  NA 140 143 141  K 3.5 3.7 3.5  CL 97* 101 97*  CO2 33* 35* 34*  GLUCOSE 88 88 96  BUN 29* 27* 24*  CREATININE 1.42*  1.18 1.21  CALCIUM 8.7* 9.0 9.2  GFRNONAA 51* >60 >60  GFRAA 60* >60 >60  ANIONGAP 10 7 10      Hematology  Recent Labs Lab 09/18/16 2200  WBC 12.0*  RBC 4.52  HGB 12.1*  HCT 35.7*  MCV 79.0  MCH 26.8  MCHC 33.9  RDW 14.1  PLT 210    Cardiac Enzymes  Recent Labs Lab 09/15/16 1426 09/15/16 1937 09/16/16 0128  TROPONINI 0.05* 0.04* 0.04*   No results for input(s): TROPIPOC in the last 168 hours.   BNPNo results for input(s): BNP, PROBNP in the last 168 hours.   DDimer No results for input(s): DDIMER in the last 168 hours.   Radiology    No results found.  Cardiac Studies   2D Echo 09/16/16 Study Conclusions  - Left ventricle: The cavity size was mildly dilated. Wall   thickness was normal. Systolic function was severely reduced. The   estimated ejection fraction was in the range of 20% to 25%.   Diffuse hypokinesis.  Indeterminant diastolic function (atrial   fibrillation). - Aortic valve: There was no stenosis. - Mitral valve: There was moderate regurgitation. - Left atrium: The atrium was severely dilated. - Right ventricle: The cavity size was normal. Systolic function   was mildly to moderately reduced. - Right atrium: The atrium was moderately dilated. - Tricuspid valve: Peak RV-RA gradient (S): 23 mm Hg. - Pulmonary arteries: PA peak pressure: 38 mm Hg (S). - Systemic veins: IVC measured 3.3 cm with < 50% respirophasic   variation, suggesting RA pressure 15 mmHg.  Impressions:  - The patient was in atrial fibrillation. Mildly dilated LV with EF   20-25%, diffuse hypokinesis. Normal RV size with mild to   moderately decreased systolic function. Moderate mitral   regurgitation, probably functional. Mild pulmonary hypertension.   Dilated IVC suggestive of elevated RV filling pressure.  Patient Profile     61 yo male with PMH of AF, NICM, asthma, and COPD who presented with dyspnea and fatigue. Found to be in AF RVR.   Assessment & Plan      1. Atrial Fibrillation: rate still elevated today, however systolic BPs have been soft, limiting titration of rate control agents. -- would continue rate control with BB and digoxin. Coreg given systolic HF. No Cardizem given reduced EF.  -- CHA2DS2 VASc score is 3. He has been started on Eliquis for a/c, 5 mg BID.  -- will need 3 weeks of OAC prior to consideration of DCCV, plans to follow up at the [facility] office.   2. Acute on Chronic Systolic CHF: NICM based on prior cath.  -- 2D echo 09/16/16 showed LVEF of 20-25% with moderate MR and mild to moderate RV with dilated IVC.  -- Diuresing with IV Lasix. Question daily weights or I/Os?  Weight change from 234 lb>>209 lb but only -3.4L this admission.  -- Consider transitioning to oral lasix today as he seems comfortable. Continue Coreg and digoxin. Currently not on an ACE/ARB given issues with soft BP and renal insufficiency at time of admit. Would continue to titrate up BB for better rate control. Has been educated on diet restrictions.   3. AKI: Resolved.   Signed, Laverda Page, NP  09/22/2016, 9:51 AM    I have examined the patient and reviewed assessment and plan and discussed with patient.  Agree with above as stated.  Changed to Lasix 40 mg BID. COntinue Dig 0.125 mg daily.  Increased Coreg to 12.5 mg BID.  HR in the 90-100 range.  Needs f/u appt in 1 week.  Will need BMet and Dig level at that time as well.  OK to discharge from a cardiac standpoint.  Informed him to minimize salt, weigh himself daily, elevate legs.  F/u with Dr. [name].  Lance Muss

## 2016-09-23 ENCOUNTER — Telehealth: Payer: Self-pay | Admitting: Internal Medicine

## 2016-09-23 LAB — CULTURE, BLOOD (ROUTINE X 2)
Culture: NO GROWTH
Culture: NO GROWTH

## 2016-09-23 NOTE — Telephone Encounter (Signed)
Returned call to patient-unable to locate who called or for what reason-advised about appointment on Monday 3/5 with Randall An PA at [facility].  Patient will check to see if VM was left and return call if so.      Patient contacted regarding discharge from [facility] on 09/22/16.    Patient understands to follow up with provider Randall An PA on 09/28/16 at 11:00 AM at [facility] office.  Patient understands discharge instructions? yes  Patient understands medications and regiment? yes  Patient understands to bring all medications to this visit? yes

## 2016-09-23 NOTE — Telephone Encounter (Signed)
New message   Pt verbalized that he is returning call for Dr.Hilty

## 2016-09-27 NOTE — Progress Notes (Signed)
Cardiology Office Note    Date:  09/28/2016   ID:  Kevin [name], DOB Apr 26, 1956, MRN [id number]  PCP:  No primary care provider on file.  Cardiologist: Dr. [name]  Chief Complaint  Patient presents with  . Follow-up    post hospital, pt has no complaints     History of Present Illness:    Kevin [name] is a 61 y.o. male with past medical history of nonischemic cardiomyopathy (EF 25-30% in 2016, cath showed minimal CAD), PAF (initially diagnosed in 2016), COPD, moderate MR and HTN who presents to the office today for hospital follow-up.   Was recently admitted at [facility] from 2/20 - 09/22/2016 for progressive dyspnea with exertion and fatigue. Was found to be in atrial fibrillation with RVR and volume overloaded by physical exam (BNP at 687). Echo showed a further reduced EF of 20-25% with diffuse HK. IV Diltiazem was transitioned to Coreg and Digoxin was added to his regimen due to hypotension. Was placed on Eliquis 5mg  BID for anticoagulation. Recorded as having a net output of -3.4L, yet weight significantly decreased from 234 lbs on admission to 207 lbs. Discharged on PO Lasix 40mg  BID and not placed on an ACE-I/ARB secondary to AKI.    In talking with the patient today, he reports doing well since his recent hospitalization. He reports significant improvement in his orthopnea, dyspnea with exertion, and lower extremity edema. Denies any chest pain or palpitations. He has not weighed himself regularly since hospital discharge but weights are stable at 207 pounds today. Reports consuming a low-sodium diet. Says he has been limiting fluid intake.   He reports good compliance with his Eliquis 5mg  BID, Coreg 12.5mg  BID, Digoxin 0.125mg  daily, and Lasix 40mg  BID. Potassium supplementation was printed on his AVS however he never picked up this prescription and asked today if he should have continued taking it.  Brings with him today a patient assistance form for his Albuterol, as he does not  have a PCP and is without insurance coverage.   Past Medical History:  Diagnosis Date  . Asthma   . Chronic systolic CHF (congestive heart failure) (HCC)    a. 06/2015 Echo: EF 25-30%, mod-sev MR, PASP . b. 08/2016: echo showing EF of 20-25% with diffuse HK  . Moderate to Severe Mitral Regurgitation    a. 06/2015 Echo: Mod-Sev MR. b. 08/2016: echo showing moderate MR.   Marland Kitchen NICM (nonischemic cardiomyopathy) (HCC)    a. 06/2015 Echo: EF 25-30%, normal cors by cath - ? Tachy-mediated;    . Non-obstructive CAD    a. 06/2015 Cath: LM nl, LAD 56m, LCX nl, RCA nl, EF 25-30%.  Marland Kitchen PAF (paroxysmal atrial fibrillation) (HCC)     Past Surgical History:  Procedure Laterality Date  . CARDIAC CATHETERIZATION N/A 07/26/2015   Procedure: Left Heart Cath and Coronary Angiography;  Surgeon: Corky Crafts, MD;  Location: [facility] INVASIVE CV LAB;  Service: Cardiovascular;  Laterality: N/A;    Current Medications: Outpatient Medications Prior to Visit  Medication Sig Dispense Refill  . albuterol (PROVENTIL HFA;VENTOLIN HFA) 108 (90 Base) MCG/ACT inhaler Inhale 2 puffs into the lungs every 6 (six) hours as needed for wheezing or shortness of breath. 1 Inhaler 3  . apixaban (ELIQUIS) 5 MG TABS tablet Take 1 tablet (5 mg total) by mouth 2 (two) times daily. 60 tablet 1  . carvedilol (COREG) 12.5 MG tablet Take 1 tablet (12.5 mg total) by mouth 2 (two) times daily with a meal. 60 tablet  2  . furosemide (LASIX) 40 MG tablet Take 1 tablet (40 mg total) by mouth 2 (two) times daily. 60 tablet 2  . lansoprazole (PREVACID) 30 MG capsule Take 30 mg by mouth daily as needed (for heartburn/indigestion).    . potassium chloride SA (K-DUR,KLOR-CON) 20 MEQ tablet Take 1 tablet (20 mEq total) by mouth daily. 30 tablet 2  . digoxin (LANOXIN) 0.125 MG tablet Take 1 tablet (0.125 mg total) by mouth daily. 30 tablet 2   No facility-administered medications prior to visit.      Allergies:   Patient has no known  allergies.   Social History   Social History  . Marital status: Single    Spouse name: N/A  . Number of children: N/A  . Years of education: N/A   Social History Main Topics  . Smoking status: Former Smoker    Packs/day: 0.50    Years: 40.00    Types: Cigarettes  . Smokeless tobacco: Never Used     Comment: Quit in 2014  . Alcohol use 0.0 oz/week     Comment: 2-3 drinks per week.  . Drug use: Yes     Comment: Not currently. Quit in 2014. 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  . Sexual activity: No   Other Topics Concern  . None   Social History Narrative  . None     Family History:  The patient's family history includes Hypertension in his father.   Review of Systems:   Please see the history of present illness.     General:  No chills, fever, night sweats or weight changes.  Cardiovascular:  No chest pain, edema, orthopnea, palpitations, paroxysmal nocturnal dyspnea. Positive for dyspnea with exertion.  Dermatological: No rash, lesions/masses Respiratory: No cough, dyspnea Urologic: No hematuria, dysuria Abdominal:   No nausea, vomiting, diarrhea, bright red blood per rectum, melena, or hematemesis Neurologic:  No visual changes, wkns, changes in mental status. All other systems reviewed and are otherwise negative except as noted above.   Physical Exam:    VS:  BP 100/60   Pulse (!) 52   Ht 6\' 1"  (1.854 m)   Wt 207 lb (93.9 kg)   SpO2 98%   BMI 27.31 kg/m    General: Well developed, well nourished [place] male appearing in no acute distress. Head: Normocephalic, atraumatic, sclera non-icteric, no xanthomas, nares are without discharge.  Neck: No carotid bruits. JVD not elevated.  Lungs: Respirations regular and unlabored, without wheezes or rales.  Heart: Irregularly irregular. No S3 or S4.  No rubs, or gallops appreciated. 2/6 SEM best appreciated along Apex Abdomen: Soft, non-tender, non-distended with normoactive bowel sounds. No hepatomegaly. No  rebound/guarding. No obvious abdominal masses. Msk:  Strength and tone appear normal for age. No joint deformities or effusions. Extremities: No clubbing or cyanosis. No edema.  Distal pedal pulses are 2+ bilaterally. Neuro: Alert and oriented X 3. Moves all extremities spontaneously. No focal deficits noted. Psych:  Responds to questions appropriately with a normal affect. Skin: No rashes or lesions noted  Wt Readings from Last 3 Encounters:  09/28/16 207 lb (93.9 kg)  09/22/16 207 lb 10.8 oz (94.2 kg)  08/20/15 215 lb (97.5 kg)     Studies/Labs Reviewed:   EKG:  EKG is not ordered today.    Recent Labs: 09/15/2016: B Natriuretic Peptide 687.7; TSH 2.335; TSH 2.361 09/18/2016: Hemoglobin 12.1; Platelets 210 09/22/2016: BUN 24; Creatinine, Ser 1.21; Potassium 3.5; Sodium 141   Lipid Panel  Component Value Date/Time   CHOL 120 07/26/2015 0340   TRIG 103 07/26/2015 0340   HDL 27 (L) 07/26/2015 0340   CHOLHDL 4.4 07/26/2015 0340   VLDL 21 07/26/2015 0340   LDLCALC 72 07/26/2015 0340    Additional studies/ records that were reviewed today include:   Echocardiogram: 09/16/2016 Study Conclusions  - Left ventricle: The cavity size was mildly dilated. Wall   thickness was normal. Systolic function was severely reduced. The   estimated ejection fraction was in the range of 20% to 25%.   Diffuse hypokinesis. Indeterminant diastolic function (atrial   fibrillation). - Aortic valve: There was no stenosis. - Mitral valve: There was moderate regurgitation. - Left atrium: The atrium was severely dilated. - Right ventricle: The cavity size was normal. Systolic function   was mildly to moderately reduced. - Right atrium: The atrium was moderately dilated. - Tricuspid valve: Peak RV-RA gradient (S): 23 mm Hg. - Pulmonary arteries: PA peak pressure: 38 mm Hg (S). - Systemic veins: IVC measured 3.3 cm with < 50% respirophasic   variation, suggesting RA pressure 15  mmHg.  Impressions:  - The patient was in atrial fibrillation. Mildly dilated LV with EF   20-25%, diffuse hypokinesis. Normal RV size with mild to   moderately decreased systolic function. Moderate mitral   regurgitation, probably functional. Mild pulmonary hypertension.   Dilated IVC suggestive of elevated RV filling pressure.   Assessment:    1. Paroxysmal atrial fibrillation (HCC)   2. Chronic systolic CHF (congestive heart failure) (HCC)   3. Nonischemic cardiomyopathy (HCC)   4. Mitral valve insufficiency, unspecified etiology   5. Essential hypertension   6. Chronic obstructive pulmonary disease, unspecified COPD type (HCC)   7. AKI (acute kidney injury) (HCC)   8. Medication management      Plan:   In order of problems listed above:  1. Paroxysmal Atrial Fibrillation - initially diagnosed with PAF in 2016. Recently admitted with CHF exacerbation in the setting of atrial fibrillation with RVR.  - This patients CHA2DS2-VASc Score and unadjusted Ischemic Stroke Rate (% per year) is equal to 3.2 % stroke rate/year from a score of 3 (CHF, HTN, CAD - mild). Had been started on Eliquis during recent admission. Spoke with Pharmacy today regarding financial assistance programs for NOACs. With him not currently having insurance coverage, he is not eligible for the programs. I encouraged him to follow-up on his recent Medicaid application. Has an appointment to get established with [facility] on 3/19. If unable to get Medicaid approval, will need to switch to Coumadin.  - continue Coreg 12.5mg  BID. Will stop Digoxin with HR of 52 during today's encounter and recent AKI.   2. Chronic Systolic CHF/ Nonischemic Cardiomyopathy - EF 25-30% by echo in 2016 with cath showing normal cors at that time.  - he does not appear volume overloaded by physical exam. Weight stable at 207 lbs.  - sodium and fluid restriction reviewed with the patient.  - I am concerned about his K+ levels as  he has been taking Lasix 40mg  BID but never picked up his Rx for K+ supplementation. Recheck BMET today.  - not a candidate for ACE-I/ARB/ARNI/ Entresto secondary to hypotension. No insurance coverage at the time of this encounter making advanced therapies limited.   3. Mitral Regurgitation - moderate by echo in 08/2016. - continue to follow.   4. HTN - BP at 100/60 today.  - continue BB.   5. COPD - does not have a  PCP as of now.  - patient assistance form completed for PRN Albuterol. Has an appointment to establish care with a PCP within the next 3 weeks.   6. AKI - creatinine peaked at 1.74 during admission, at 1.21 on 2/27. - repeat BMET  Medication Adjustments/Labs and Tests Ordered: Current medicines are reviewed at length with the patient today.  Concerns regarding medicines are outlined above.  Medication changes, Labs and Tests ordered today are listed in the Patient Instructions below. Patient Instructions  Your physician has recommended you make the following change in your medication:  STOP DIGOXIN  Your physician recommends that you return for lab work in:  TODAY BMET   Your physician recommends that you schedule a follow-up appointment in:  AS PLANNED  Signed, Ellsworth Lennox, [place]  09/28/2016 5:34 PM    [facility] [address], [address] [place], [place]  [address] Phone: [phone]; Fax: [phone]  [address] [place], [place] [address] Phone: [phone]

## 2016-09-28 ENCOUNTER — Ambulatory Visit (INDEPENDENT_AMBULATORY_CARE_PROVIDER_SITE_OTHER): Payer: Self-pay | Admitting: Student

## 2016-09-28 ENCOUNTER — Encounter: Payer: Self-pay | Admitting: Student

## 2016-09-28 VITALS — BP 100/60 | HR 52 | Ht 73.0 in | Wt 207.0 lb

## 2016-09-28 DIAGNOSIS — J449 Chronic obstructive pulmonary disease, unspecified: Secondary | ICD-10-CM

## 2016-09-28 DIAGNOSIS — N179 Acute kidney failure, unspecified: Secondary | ICD-10-CM

## 2016-09-28 DIAGNOSIS — I428 Other cardiomyopathies: Secondary | ICD-10-CM

## 2016-09-28 DIAGNOSIS — I10 Essential (primary) hypertension: Secondary | ICD-10-CM | POA: Insufficient documentation

## 2016-09-28 DIAGNOSIS — Z79899 Other long term (current) drug therapy: Secondary | ICD-10-CM

## 2016-09-28 DIAGNOSIS — I480 Paroxysmal atrial fibrillation: Secondary | ICD-10-CM

## 2016-09-28 DIAGNOSIS — I340 Nonrheumatic mitral (valve) insufficiency: Secondary | ICD-10-CM

## 2016-09-28 DIAGNOSIS — I5022 Chronic systolic (congestive) heart failure: Secondary | ICD-10-CM

## 2016-09-28 LAB — BASIC METABOLIC PANEL
BUN: 20 mg/dL (ref 7–25)
CO2: 36 mmol/L — ABNORMAL HIGH (ref 20–31)
Calcium: 9.7 mg/dL (ref 8.6–10.3)
Chloride: 102 mmol/L (ref 98–110)
Creat: 1.17 mg/dL (ref 0.70–1.25)
Glucose, Bld: 93 mg/dL (ref 65–99)
POTASSIUM: 4.7 mmol/L (ref 3.5–5.3)
Sodium: 143 mmol/L (ref 135–146)

## 2016-09-28 NOTE — Patient Instructions (Addendum)
Your physician has recommended you make the following change in your medication:  STOP DIGOXIN  Your physician recommends that you return for lab work in:  TODAY BMET    Your physician recommends that you schedule a follow-up appointment in:  AS PLANNED

## 2016-10-12 ENCOUNTER — Ambulatory Visit (INDEPENDENT_AMBULATORY_CARE_PROVIDER_SITE_OTHER): Payer: Self-pay | Admitting: Family Medicine

## 2016-10-12 ENCOUNTER — Encounter: Payer: Self-pay | Admitting: Family Medicine

## 2016-10-12 VITALS — BP 104/73 | HR 87 | Temp 98.7°F | Resp 18 | Ht 73.0 in | Wt 211.0 lb

## 2016-10-12 DIAGNOSIS — I480 Paroxysmal atrial fibrillation: Secondary | ICD-10-CM

## 2016-10-12 DIAGNOSIS — I509 Heart failure, unspecified: Secondary | ICD-10-CM

## 2016-10-12 LAB — CBC WITH DIFFERENTIAL/PLATELET
Basophils Absolute: 0 cells/uL (ref 0–200)
Basophils Relative: 0 %
EOS ABS: 153 {cells}/uL (ref 15–500)
Eosinophils Relative: 3 %
HEMATOCRIT: 44.5 % (ref 38.5–50.0)
HEMOGLOBIN: 14.1 g/dL (ref 13.2–17.1)
LYMPHS ABS: 1785 {cells}/uL (ref 850–3900)
Lymphocytes Relative: 35 %
MCH: 26.6 pg — ABNORMAL LOW (ref 27.0–33.0)
MCHC: 31.7 g/dL — AB (ref 32.0–36.0)
MCV: 83.8 fL (ref 80.0–100.0)
MONO ABS: 408 {cells}/uL (ref 200–950)
MPV: 11.1 fL (ref 7.5–12.5)
Monocytes Relative: 8 %
NEUTROS ABS: 2754 {cells}/uL (ref 1500–7800)
NEUTROS PCT: 54 %
Platelets: 163 10*3/uL (ref 140–400)
RBC: 5.31 MIL/uL (ref 4.20–5.80)
RDW: 14.5 % (ref 11.0–15.0)
WBC: 5.1 10*3/uL (ref 3.8–10.8)

## 2016-10-12 MED ORDER — APIXABAN 5 MG PO TABS: 5.0000 mg | ORAL_TABLET | Freq: Two times a day (BID) | ORAL | 0 refills | Status: DC

## 2016-10-12 NOTE — Progress Notes (Addendum)
Patient ID: Kevin [name], male    DOB: 1955-07-30, 61 y.o.   MRN: [id number]  PCP: Molli Barrows, FNP  Chief Complaint  Patient presents with  . Establish Care    just got out hospital for atrial fibrillation    Subjective:  HPI Kevin [name] is a 61 y.o. male presents to establish care and hospital follow-up.  Current medical problems include: paroxysmal atrial fibrillation, cardiomyopathy, mitral regurgitation, hypertension,  and COPD. He was recently admitted to [facility]16 and discharged 09/22/2016 with AFIB RVR and CHF. During hospital stay he was placed on a Cardizem drip and subsequently transition to Metoprolol 25 mg. He was diuresed with IV Lasix with total volume loss 4.5 liters and discharged on oral Lasix. Patient has been seen at [facility] for his initial cardiology follow-up in which his labs were stable. During hospitalization, patient suffered from AKI and hypokalemia. His next Cardiology follow-up is scheduled for March 26.  Atrial Fibrillation and Cardiomyopathy  He is anticoagulated on Eliquis 5 mg twice daily, takes lasix 40 mg twice daily, potassium 20 MEQ daily, and Coreg 12.5, twice daily. He is uninsured and has not completed application for [facility] patient assistance. Today he has completed an application for medication assistance for Eliquis and requests completion of provider section. He denies any shortness of breath, palpitations, edema, chest pain, dizziness, or syncopal episodes since discharged from the hospital. He weighs himself daily every morning and reports no recent weight gain. He is also routinely checking for lower extremity swelling.  COPD  Former smoker. Controlled COPD. Last use of rescue inhaler 1 week ago for an episode of acute wheezing. He denies need for refill today. He is able to get this medication free through a patient assistance program.    Social History   Social History  . Marital status: Single    Spouse  name: N/A  . Number of children: N/A  . Years of education: N/A   Occupational History  . Not on file.   Social History Main Topics  . Smoking status: Former Smoker    Packs/day: 0.50    Years: 40.00    Types: Cigarettes  . Smokeless tobacco: Never Used     Comment: Quit in 2014  . Alcohol use 0.0 oz/week     Comment: 2-3 drinks per week.  . Drug use: Yes     Comment: Not currently. Quit in 2014. 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  . Sexual activity: No   Other Topics Concern  . Not on file   Social History Narrative  . No narrative on file    Family History  Problem Relation Age of Onset  . Hypertension Father    Review of Systems See HPI Patient Active Problem List   Diagnosis Date Noted  . Mitral regurgitation 09/28/2016  . Essential hypertension 09/28/2016  . Acute on chronic systolic CHF (congestive heart failure) ([place]) 09/15/2016  . Nonischemic cardiomyopathy ([place])   . Coronary artery disease involving native coronary artery of native heart without angina pectoris   . Chronic systolic CHF (congestive heart failure) ([place]) 07/26/2015  . Paroxysmal atrial fibrillation ([place]) 07/23/2015  . COPD (chronic obstructive pulmonary disease) ([place]) 07/23/2015  . Elevated troponin 07/23/2015  . Elevated brain natriuretic peptide (BNP) level 07/23/2015    No Known Allergies  Prior to Admission medications   Medication Sig Start Date End Date Taking? Authorizing Provider  albuterol (PROVENTIL HFA;VENTOLIN HFA) 108 (90 Base) MCG/ACT inhaler Inhale 2 puffs into the lungs  every 6 (six) hours as needed for wheezing or shortness of breath. 08/20/15  Yes Sherran Needs, NP  apixaban (ELIQUIS) 5 MG TABS tablet Take 1 tablet (5 mg total) by mouth 2 (two) times daily. 09/22/16  Yes Oswald Hillock, MD  carvedilol (COREG) 12.5 MG tablet Take 1 tablet (12.5 mg total) by mouth 2 (two) times daily with a meal. 09/22/16  Yes Oswald Hillock, MD  furosemide (LASIX) 40 MG tablet Take 1 tablet (40 mg  total) by mouth 2 (two) times daily. 09/22/16  Yes Oswald Hillock, MD  lansoprazole (PREVACID) 30 MG capsule Take 30 mg by mouth daily as needed (for heartburn/indigestion).   Yes Historical Provider, MD  potassium chloride SA (K-DUR,KLOR-CON) 20 MEQ tablet Take 1 tablet (20 mEq total) by mouth daily. 09/22/16  Yes Oswald Hillock, MD    Past Medical, Surgical Family and Social History reviewed and updated.    Objective:   Today's Vitals   10/12/16 0921  BP: 104/73  Pulse: 87  Resp: 18  Temp: 98.7 F (37.1 C)  TempSrc: Oral  SpO2: 96%  Weight: 211 lb (95.7 kg)  Height: _0  (1.854 m)    Wt Readings from Last 3 Encounters:  10/12/16 211 lb (95.7 kg)  09/28/16 207 lb (93.9 kg)  09/22/16 207 lb 10.8 oz (94.2 kg)   Physical Exam  Constitutional: He is oriented to person, place, and time. He appears well-developed and well-nourished.  HENT:  Head: Normocephalic and atraumatic.  Eyes: Conjunctivae and EOM are normal. Pupils are equal, round, and reactive to light.  Neck: Normal range of motion. No JVD present.  Cardiovascular: Normal rate and intact distal pulses.  A regularly irregular rhythm present.  Pulses:      Carotid pulses are 2+ on the right side, and 2+ on the left side.      Radial pulses are 2+ on the right side, and 2+ on the left side.  Pulmonary/Chest: Effort normal and breath sounds normal.  Musculoskeletal: Normal range of motion.  Neurological: He is alert and oriented to person, place, and time.  Skin: Skin is warm and dry.  Psychiatric: He has a normal mood and affect. His behavior is normal. Judgment and thought content normal.     Assessment & Plan:  1. Heart failure, unspecified heart failure chronicity, unspecified heart failure type (HCC) - CMP14+EGFR -checking potassium and renal function -Continue Lasix 40 mg BID and Potassium 20 MEQ daily  -Continue daily weights. Notify me immediately is you experience a 3-5 lb weight gain in less than 24 hours.  2.  Paroxysmal atrial fibrillation (HCC) -Continue carvedilol (Coreg) 12.5 mg, 2 times daily with meals. -Continue Eliquis 5 mg twice daily. I have printed a prescription for you to have an additional 15 days of medication in the  event you run out prior to getting your supply from the pharmaceutical company.  Warning signs of worsening heart failure and or worsening atrial fibrillation discussed and education provided.  [facility] Patient Assistance form provided.  Return in 3 months for a complete physical exam.   Carroll Sage. Kenton Kingfisher, MSN, [facility] [address] [place], [place] [address] [phone]

## 2016-10-12 NOTE — Patient Instructions (Signed)
Our office will contact you with results from your lab work today. Continue all medications as prescribed.  Please return in 3 months for a complete physical exam.  As discussed, I have included some red flags for which indicate worsening heart failure or Afib.  If any these events occur, please report immediately to the emergency department.    Heart Failure Heart failure means your heart has trouble pumping blood. This makes it hard for your body to work well. Heart failure is usually a long-term (chronic) condition. You must take good care of yourself and follow your doctor's treatment plan. Follow these instructions at home:  Take your heart medicine as told by your doctor.  Do not stop taking medicine unless your doctor tells you to.  Do not skip any dose of medicine.  Refill your medicines before they run out.  Take other medicines only as told by your doctor or pharmacist.  Stay active if told by your doctor. The elderly and people with severe heart failure should talk with a doctor about physical activity.  Eat heart-healthy foods. Choose foods that are without trans fat and are low in saturated fat, cholesterol, and salt (sodium). This includes fresh or frozen fruits and vegetables, fish, lean meats, fat-free or low-fat dairy foods, whole grains, and high-fiber foods. Lentils and dried peas and beans (legumes) are also good choices.  Limit salt if told by your doctor.  Cook in a healthy way. Roast, grill, broil, bake, poach, steam, or stir-fry foods.  Limit fluids as told by your doctor.  Weigh yourself every morning. Do this after you pee (urinate) and before you eat breakfast. Write down your weight to give to your doctor.  Take your blood pressure and write it down if your doctor tells you to.  Ask your doctor how to check your pulse. Check your pulse as told.  Lose weight if told by your doctor.  Stop smoking or chewing tobacco. Do not use gum or patches that help  you quit without your doctor's approval.  Schedule and go to doctor visits as told.  Nonpregnant women should have no more than 1 drink a day. Men should have no more than 2 drinks a day. Talk to your doctor about drinking alcohol.  Stop illegal drug use.  Stay current with shots (immunizations).  Manage your health conditions as told by your doctor.  Learn to manage your stress.  Rest when you are tired.  If it is really hot outside:  Avoid intense activities.  Use air conditioning or fans, or get in a cooler place.  Avoid caffeine and alcohol.  Wear loose-fitting, lightweight, and light-colored clothing.  If it is really cold outside:  Avoid intense activities.  Layer your clothing.  Wear mittens or gloves, a hat, and a scarf when going outside.  Avoid alcohol.  Learn about heart failure and get support as needed.  Get help to maintain or improve your quality of life and your ability to care for yourself as needed. Contact a doctor if:  You gain weight quickly.  You are more short of breath than usual.  You cannot do your normal activities.  You tire easily.  You cough more than normal, especially with activity.  You have any or more puffiness (swelling) in areas such as your hands, feet, ankles, or belly (abdomen).  You cannot sleep because it is hard to breathe.  You feel like your heart is beating fast (palpitations).  You get dizzy or light-headed when  you stand up. Get help right away if:  You have trouble breathing.  There is a change in mental status, such as becoming less alert or not being able to focus.  You have chest pain or discomfort.  You faint. This information is not intended to replace advice given to you by your health care provider. Make sure you discuss any questions you have with your health care provider. Document Released: 04/21/2008 Document Revised: 12/19/2015 Document Reviewed: 08/29/2012 Elsevier Interactive Patient  Education  2017 Elsevier Inc.       Atrial Fibrillation Atrial fibrillation is a type of heartbeat that is irregular or fast (rapid). If you have this condition, your heart keeps quivering in a weird (chaotic) way. This condition can make it so your heart cannot pump blood normally. Having this condition gives a person more risk for stroke, heart failure, and other heart problems. There are different types of atrial fibrillation. Talk with your doctor to learn about the type that you have. Follow these instructions at home:  Take over-the-counter and prescription medicines only as told by your doctor.  If your doctor prescribed a blood-thinning medicine, take it exactly as told. Taking too much of it can cause bleeding. If you do not take enough of it, you will not have the protection that you need against stroke and other problems.  Do not use any tobacco products. These include cigarettes, chewing tobacco, and e-cigarettes. If you need help quitting, ask your doctor.  If you have apnea (obstructive sleep apnea), manage it as told by your doctor.  Do not drink alcohol.  Do not drink beverages that have caffeine. These include coffee, soda, and tea.  Maintain a healthy weight. Do not use diet pills unless your doctor says they are safe for you. Diet pills may make heart problems worse.  Follow diet instructions as told by your doctor.  Exercise regularly as told by your doctor.  Keep all follow-up visits as told by your doctor. This is important. Contact a doctor if:  You notice a change in the speed, rhythm, or strength of your heartbeat.  You are taking a blood-thinning medicine and you notice more bruising.  You get tired more easily when you move or exercise. Get help right away if:  You have pain in your chest or your belly (abdomen).  You have sweating or weakness.  You feel sick to your stomach (nauseous).  You notice blood in your throw up (vomit), poop (stool),  or pee (urine).  You are short of breath.  You suddenly have swollen feet and ankles.  You feel dizzy.  Your suddenly get weak or numb in your face, arms, or legs, especially if it happens on one side of your body.  You have trouble talking, trouble understanding, or both.  Your face or your eyelid droops on one side. These symptoms may be an emergency. Do not wait to see if the symptoms will go away. Get medical help right away. Call your local emergency services (911 in the [place] Do not drive yourself to the hospital. This information is not intended to replace advice given to you by your health care provider. Make sure you discuss any questions you have with your health care provider. Document Released: 04/21/2008 Document Revised: 12/19/2015 Document Reviewed: 11/07/2014 Elsevier Interactive Patient Education  2017 [employer].

## 2016-10-19 ENCOUNTER — Ambulatory Visit: Payer: Self-pay | Admitting: Internal Medicine

## 2016-10-19 ENCOUNTER — Telehealth: Payer: Self-pay | Admitting: Internal Medicine

## 2016-10-19 ENCOUNTER — Ambulatory Visit (INDEPENDENT_AMBULATORY_CARE_PROVIDER_SITE_OTHER): Payer: Self-pay | Admitting: Internal Medicine

## 2016-10-19 ENCOUNTER — Encounter: Payer: Self-pay | Admitting: Internal Medicine

## 2016-10-19 VITALS — BP 118/78 | HR 91 | Ht 73.0 in | Wt 213.0 lb

## 2016-10-19 DIAGNOSIS — I428 Other cardiomyopathies: Secondary | ICD-10-CM

## 2016-10-19 DIAGNOSIS — I10 Essential (primary) hypertension: Secondary | ICD-10-CM

## 2016-10-19 DIAGNOSIS — I481 Persistent atrial fibrillation: Secondary | ICD-10-CM

## 2016-10-19 DIAGNOSIS — I4811 Longstanding persistent atrial fibrillation: Secondary | ICD-10-CM | POA: Insufficient documentation

## 2016-10-19 DIAGNOSIS — I5023 Acute on chronic systolic (congestive) heart failure: Secondary | ICD-10-CM

## 2016-10-19 DIAGNOSIS — N179 Acute kidney failure, unspecified: Secondary | ICD-10-CM

## 2016-10-19 MED ORDER — LOSARTAN POTASSIUM 25 MG PO TABS: 25.0000 mg | ORAL_TABLET | Freq: Every day | ORAL | 5 refills | Status: DC

## 2016-10-19 NOTE — Progress Notes (Signed)
OFFICE NOTE  Chief Complaint:  Hospital follow-up  Primary Care Physician: Joaquin Courts, FNP  HPI:  Kevin [name] is a 62 y.o. male with past medical history of nonischemic cardiomyopathy (EF 25-30% in 2016, cath showed minimal CAD), PAF (initially diagnosed in 2016), COPD, moderate MR and HTN who presents to the office today for hospital follow-up. Was recently admitted at [facility] from 2/20 - 09/22/2016 for progressive dyspnea with exertion and fatigue. Was found to be in atrial fibrillation with RVR and volume overloaded by physical exam (BNP at 687). Echo showed a further reduced EF of 20-25% with diffuse HK. IV Diltiazem was transitioned to Coreg and Digoxin was added to his regimen due to hypotension. Was placed on Eliquis 5mg  BID for anticoagulation. Recorded as having a net output of -3.4L, yet weight significantly decreased from 234 lbs on admission to 207 lbs. Discharged on PO Lasix 40mg  BID and not placed on an ACE-I/ARB secondary to AKI.  He saw [place], PA-C in follow-up and has been doing well since then. He denies any worsening shortness of breath and he reports his weight is been stable around 200 pounds at home. He is uninsured and has not applied for Medicaid or any other assistance. We did try to provide resources for that today. I've also been asked to fill out paperwork to get patient assistance for his albuterol inhaler which I did today.  PMHx:  Past Medical History:  Diagnosis Date  . Asthma   . Chronic systolic CHF (congestive heart failure) (HCC)    a. 06/2015 Echo: EF 25-30%, mod-sev MR, PASP . b. 08/2016: echo showing EF of 20-25% with diffuse HK  . Moderate to Severe Mitral Regurgitation    a. 06/2015 Echo: Mod-Sev MR. b. 08/2016: echo showing moderate MR.   Marland Kitchen NICM (nonischemic cardiomyopathy) (HCC)    a. 06/2015 Echo: EF 25-30%, normal cors by cath - ? Tachy-mediated;    . Non-obstructive CAD    a. 06/2015 Cath: LM nl, LAD 29m, LCX nl,  RCA nl, EF 25-30%.  Marland Kitchen PAF (paroxysmal atrial fibrillation) (HCC)     Past Surgical History:  Procedure Laterality Date  . CARDIAC CATHETERIZATION N/A 07/26/2015   Procedure: Left Heart Cath and Coronary Angiography;  Surgeon: Corky Crafts, MD;  [facility] INVASIVE CV LAB;  Service: Cardiovascular;  Laterality: N/A;    FAMHx:  Family History  Problem Relation Age of Onset  . Hypertension Father     SOCHx:   reports that he has quit smoking. His smoking use included Cigarettes. He has a 20.00 pack-year smoking history. He has never used smokeless tobacco. He reports that he drinks alcohol. He reports that he uses drugs.  ALLERGIES:  No Known Allergies  ROS: Pertinent items noted in HPI and remainder of comprehensive ROS otherwise negative.  HOME MEDS: Current Outpatient Prescriptions on File Prior to Visit  Medication Sig Dispense Refill  . albuterol (PROVENTIL HFA;VENTOLIN HFA) 108 (90 Base) MCG/ACT inhaler Inhale 2 puffs into the lungs every 6 (six) hours as needed for wheezing or shortness of breath. 1 Inhaler 3  . apixaban (ELIQUIS) 5 MG TABS tablet Take 1 tablet (5 mg total) by mouth 2 (two) times daily. 30 tablet 0  . carvedilol (COREG) 12.5 MG tablet Take 1 tablet (12.5 mg total) by mouth 2 (two) times daily with a meal. 60 tablet 2  . furosemide (LASIX) 40 MG tablet Take 1 tablet (40 mg total) by mouth 2 (two) times daily. 60  tablet 2  . lansoprazole (PREVACID) 30 MG capsule Take 30 mg by mouth daily as needed (for heartburn/indigestion).    . potassium chloride SA (K-DUR,KLOR-CON) 20 MEQ tablet Take 1 tablet (20 mEq total) by mouth daily. 30 tablet 2   No current facility-administered medications on file prior to visit.     LABS/IMAGING: No results found for this or any previous visit (from the past 48 hour(s)). No results found.  WEIGHTS: Wt Readings from Last 3 Encounters:  10/19/16 213 lb (96.6 kg)  10/12/16 211 lb (95.7 kg)  09/28/16 207 lb (93.9 kg)      VITALS: BP 118/78   Pulse 91   Ht 6\' 1"  (1.854 m)   Wt 213 lb (96.6 kg)   BMI 28.10 kg/m   EXAM: General appearance: alert and no distress Lungs: clear to auscultation bilaterally Heart: irregularly irregular rhythm Extremities: extremities normal, atraumatic, no cyanosis or edema Neurologic: Grossly normal  EKG: Atrial fibrillation at 91  ASSESSMENT: 1. Nonischemic cardio myopathy EF 25-30% 2. Recent acute on chronic systolic congestive heart failure 3. Long-standing persistent atrial fibrillation-CHADSVASC score 5 on Eliquis 4. Intermittent asthma-on albuterol  PLAN: 1.   [name] recently presented with acute systolic congestive heart failure and had 15-20 pound weight gain. He diuresed and has been doing well since discharge. Weight at home is been stable. He is tolerating carvedilol and renal function has improved. I think he would benefit from addition of a low-dose ARB. I would recommend losartan 25 mg daily. We will plan to check a repeat echocardiogram in 6 months and follow-up with him at that time. Hopefully we'll be able to establish some insurance coverage by then.  Chrystie Nose, MD, [facility] Attending Cardiologist [facility]  Chrystie Nose 10/19/2016, 4:25 PM

## 2016-10-19 NOTE — Patient Instructions (Signed)
Your physician has recommended you make the following change in your medication: START losartan 25mg  once daily  Your physician has requested that you have a limited echocardiogram in 6 months. Echocardiography is a painless test that uses sound waves to create images of your heart. It provides your doctor with information about the size and shape of your heart and how well your heart's chambers and valves are working. This procedure takes approximately one hour. There are no restrictions for this procedure.  Your physician wants you to follow-up in: 6 months with Dr. [name]. You will receive a reminder letter in the mail two months in advance. If you don't receive a letter, please call our office to schedule the follow-up appointment.

## 2016-10-19 NOTE — Telephone Encounter (Signed)
Patient was seen in office 10/19/16. Dr. [name] completed paperwork for patient to get ventolin HFA inhaler for free from manufacturer. Copy of form made for records.

## 2016-10-20 ENCOUNTER — Telehealth: Payer: Self-pay | Admitting: Internal Medicine

## 2016-10-20 NOTE — Telephone Encounter (Signed)
LMTCB  Per Jim Like, RN info received on Medicaid: -- [facility] Department of [facility].  -- annual income of approx. $15,000-16000 -- monthly income for family of 3 average/approximation: $2,227

## 2016-10-29 NOTE — Telephone Encounter (Signed)
LMTCB

## 2016-10-30 NOTE — Telephone Encounter (Signed)
Spoke with patient. Communicated advice on Medicaid qualifications. He voiced understanding.

## 2016-11-09 ENCOUNTER — Other Ambulatory Visit (HOSPITAL_COMMUNITY): Payer: Self-pay

## 2016-11-11 ENCOUNTER — Telehealth: Payer: Self-pay | Admitting: Internal Medicine

## 2016-11-11 NOTE — Telephone Encounter (Signed)
Returned call-advised patient that medication is usually shipped directly to patient but could call to verify-patient already has #.  Advised to call back with further questions or concerns.

## 2016-11-11 NOTE — Telephone Encounter (Signed)
New Message   Per pt received letter being accepted into the Eliquis program. Pt is now unsure where medication should be sent. Should it be sent to Dr. Blanchie Dessert office or to his personal address. Requesting call back from

## 2017-01-11 ENCOUNTER — Ambulatory Visit: Payer: Self-pay | Admitting: Family Medicine

## 2017-02-04 ENCOUNTER — Emergency Department (HOSPITAL_COMMUNITY): Payer: Self-pay

## 2017-02-04 ENCOUNTER — Inpatient Hospital Stay (HOSPITAL_COMMUNITY)
Admission: EM | Admit: 2017-02-04 | Discharge: 2017-02-11 | DRG: 292 | Disposition: A | Discharge status: Home / Self Care | Payer: Self-pay | Attending: Cardiovascular Disease | Admitting: Cardiovascular Disease

## 2017-02-04 ENCOUNTER — Encounter (HOSPITAL_COMMUNITY): Payer: Self-pay | Admitting: Emergency Medicine

## 2017-02-04 DIAGNOSIS — N182 Chronic kidney disease, stage 2 (mild): Secondary | ICD-10-CM

## 2017-02-04 DIAGNOSIS — I4891 Unspecified atrial fibrillation: Secondary | ICD-10-CM

## 2017-02-04 DIAGNOSIS — I959 Hypotension, unspecified: Secondary | ICD-10-CM | POA: Diagnosis present

## 2017-02-04 DIAGNOSIS — I5023 Acute on chronic systolic (congestive) heart failure: Principal | ICD-10-CM

## 2017-02-04 DIAGNOSIS — I272 Pulmonary hypertension, unspecified: Secondary | ICD-10-CM | POA: Diagnosis present

## 2017-02-04 DIAGNOSIS — N183 Chronic kidney disease, stage 3 (moderate): Secondary | ICD-10-CM | POA: Diagnosis present

## 2017-02-04 DIAGNOSIS — Z87898 Personal history of other specified conditions: Secondary | ICD-10-CM

## 2017-02-04 DIAGNOSIS — I481 Persistent atrial fibrillation: Secondary | ICD-10-CM

## 2017-02-04 DIAGNOSIS — Z7901 Long term (current) use of anticoagulants: Secondary | ICD-10-CM

## 2017-02-04 DIAGNOSIS — Z79899 Other long term (current) drug therapy: Secondary | ICD-10-CM

## 2017-02-04 DIAGNOSIS — F1491 Cocaine use, unspecified, in remission: Secondary | ICD-10-CM

## 2017-02-04 DIAGNOSIS — I2510 Atherosclerotic heart disease of native coronary artery without angina pectoris: Secondary | ICD-10-CM | POA: Diagnosis present

## 2017-02-04 DIAGNOSIS — I429 Cardiomyopathy, unspecified: Secondary | ICD-10-CM | POA: Diagnosis present

## 2017-02-04 DIAGNOSIS — I428 Other cardiomyopathies: Secondary | ICD-10-CM

## 2017-02-04 DIAGNOSIS — I340 Nonrheumatic mitral (valve) insufficiency: Secondary | ICD-10-CM | POA: Diagnosis present

## 2017-02-04 DIAGNOSIS — Z9114 Patient's other noncompliance with medication regimen: Secondary | ICD-10-CM

## 2017-02-04 DIAGNOSIS — Z87891 Personal history of nicotine dependence: Secondary | ICD-10-CM

## 2017-02-04 HISTORY — DX: Longstanding persistent atrial fibrillation: I48.11

## 2017-02-04 HISTORY — DX: Cocaine use, unspecified, uncomplicated: F14.90

## 2017-02-04 HISTORY — DX: Essential (primary) hypertension: I10

## 2017-02-04 HISTORY — DX: Tobacco use: Z72.0

## 2017-02-04 LAB — CBC
HEMATOCRIT: 41.1 % (ref 39.0–52.0)
Hemoglobin: 13.5 g/dL (ref 13.0–17.0)
MCH: 27.4 pg (ref 26.0–34.0)
MCHC: 32.8 g/dL (ref 30.0–36.0)
MCV: 83.4 fL (ref 78.0–100.0)
Platelets: 218 10*3/uL (ref 150–400)
RBC: 4.93 MIL/uL (ref 4.22–5.81)
RDW: 14.4 % (ref 11.5–15.5)
WBC: 11.5 10*3/uL — ABNORMAL HIGH (ref 4.0–10.5)

## 2017-02-04 LAB — URINALYSIS, ROUTINE W REFLEX MICROSCOPIC
BACTERIA UA: NONE SEEN
Bilirubin Urine: NEGATIVE
Glucose, UA: NEGATIVE mg/dL
Hgb urine dipstick: NEGATIVE
Ketones, ur: NEGATIVE mg/dL
Leukocytes, UA: NEGATIVE
NITRITE: NEGATIVE
PROTEIN: 100 mg/dL — AB
Specific Gravity, Urine: 1.014 (ref 1.005–1.030)
pH: 5 (ref 5.0–8.0)

## 2017-02-04 LAB — I-STAT TROPONIN, ED: TROPONIN I, POC: 0.04 ng/mL (ref 0.00–0.08)

## 2017-02-04 LAB — COMPREHENSIVE METABOLIC PANEL
ALBUMIN: 3.3 g/dL — AB (ref 3.5–5.0)
ALK PHOS: 95 U/L (ref 38–126)
ALT: 29 U/L (ref 17–63)
AST: 32 U/L (ref 15–41)
Anion gap: 11 (ref 5–15)
BILIRUBIN TOTAL: 1.8 mg/dL — AB (ref 0.3–1.2)
BUN: 24 mg/dL — ABNORMAL HIGH (ref 6–20)
CO2: 26 mmol/L (ref 22–32)
CREATININE: 1.41 mg/dL — AB (ref 0.61–1.24)
Calcium: 8.9 mg/dL (ref 8.9–10.3)
Chloride: 104 mmol/L (ref 101–111)
GFR calc Af Amer: >60 mL/min (ref >60)
GFR calc non Af Amer: 52 mL/min — ABNORMAL LOW (ref >60)
GLUCOSE: 110 mg/dL — AB (ref 65–99)
POTASSIUM: 4.4 mmol/L (ref 3.5–5.1)
Sodium: 141 mmol/L (ref 135–145)
TOTAL PROTEIN: 6.8 g/dL (ref 6.5–8.1)

## 2017-02-04 LAB — LIPASE, BLOOD: Lipase: 23 U/L (ref 11–51)

## 2017-02-04 LAB — BRAIN NATRIURETIC PEPTIDE: B Natriuretic Peptide: 655 pg/mL — ABNORMAL HIGH (ref 0.0–100.0)

## 2017-02-04 MED ORDER — APIXABAN 5 MG PO TABS
5.0000 mg | ORAL_TABLET | Freq: Two times a day (BID) | ORAL | Status: DC
  Administered 2017-02-04 – 2017-02-11 (×14): 5 mg via ORAL
  Filled 2017-02-04 (×14): qty 1

## 2017-02-04 MED ORDER — LOSARTAN POTASSIUM 25 MG PO TABS
25.0000 mg | ORAL_TABLET | Freq: Every day | ORAL | Status: DC
  Administered 2017-02-04 – 2017-02-07 (×4): 25 mg via ORAL
  Filled 2017-02-04 (×4): qty 1

## 2017-02-04 MED ORDER — METOPROLOL TARTRATE 5 MG/5ML IV SOLN
2.5000 mg | Freq: Once | INTRAVENOUS | Status: AC
  Administered 2017-02-04: 2.5 mg via INTRAVENOUS
  Filled 2017-02-04: qty 5

## 2017-02-04 MED ORDER — ACETAMINOPHEN 325 MG PO TABS
650.0000 mg | ORAL_TABLET | ORAL | Status: DC | PRN
  Administered 2017-02-05 (×2): 650 mg via ORAL
  Filled 2017-02-04 (×2): qty 2

## 2017-02-04 MED ORDER — PANTOPRAZOLE SODIUM 20 MG PO TBEC
20.0000 mg | DELAYED_RELEASE_TABLET | Freq: Every day | ORAL | Status: DC
  Administered 2017-02-05 – 2017-02-11 (×7): 20 mg via ORAL
  Filled 2017-02-04 (×7): qty 1

## 2017-02-04 MED ORDER — CARVEDILOL 12.5 MG PO TABS
12.5000 mg | ORAL_TABLET | Freq: Two times a day (BID) | ORAL | Status: DC
  Administered 2017-02-05 – 2017-02-06 (×3): 12.5 mg via ORAL
  Filled 2017-02-04 (×4): qty 1

## 2017-02-04 MED ORDER — METOPROLOL TARTRATE 5 MG/5ML IV SOLN
5.0000 mg | Freq: Once | INTRAVENOUS | Status: AC
  Administered 2017-02-04: 5 mg via INTRAVENOUS

## 2017-02-04 MED ORDER — FUROSEMIDE 10 MG/ML IJ SOLN
40.0000 mg | Freq: Two times a day (BID) | INTRAMUSCULAR | Status: DC
  Administered 2017-02-04 – 2017-02-06 (×4): 40 mg via INTRAVENOUS
  Filled 2017-02-04 (×5): qty 4

## 2017-02-04 MED ORDER — SODIUM CHLORIDE 0.9% FLUSH
3.0000 mL | Freq: Two times a day (BID) | INTRAVENOUS | Status: DC
  Administered 2017-02-04 – 2017-02-11 (×13): 3 mL via INTRAVENOUS

## 2017-02-04 MED ORDER — POTASSIUM CHLORIDE CRYS ER 20 MEQ PO TBCR
20.0000 meq | EXTENDED_RELEASE_TABLET | Freq: Every day | ORAL | Status: DC
  Administered 2017-02-04 – 2017-02-11 (×8): 20 meq via ORAL
  Filled 2017-02-04 (×8): qty 1

## 2017-02-04 MED ORDER — DILTIAZEM LOAD VIA INFUSION
5.0000 mg | Freq: Once | INTRAVENOUS | Status: AC
  Administered 2017-02-04: 5 mg via INTRAVENOUS

## 2017-02-04 MED ORDER — SODIUM CHLORIDE 0.9% FLUSH: 3.0000 mL | INTRAVENOUS | Status: DC | PRN

## 2017-02-04 MED ORDER — APIXABAN 5 MG PO TABS
5.0000 mg | ORAL_TABLET | Freq: Two times a day (BID) | ORAL | Status: DC
  Administered 2017-02-04: 5 mg via ORAL
  Filled 2017-02-04 (×2): qty 1

## 2017-02-04 MED ORDER — DILTIAZEM HCL-DEXTROSE 100-5 MG/100ML-% IV SOLN (PREMIX)
5.0000 mg/h | INTRAVENOUS | Status: DC
  Administered 2017-02-04: 5 mg/h via INTRAVENOUS
  Administered 2017-02-04 – 2017-02-05 (×2): 12.5 mg/h via INTRAVENOUS
  Filled 2017-02-04 (×4): qty 100

## 2017-02-04 MED ORDER — ONDANSETRON HCL 4 MG/2ML IJ SOLN: 4.0000 mg | Freq: Four times a day (QID) | INTRAMUSCULAR | Status: DC | PRN

## 2017-02-04 MED ORDER — METOPROLOL TARTRATE 5 MG/5ML IV SOLN
5.0000 mg | Freq: Once | INTRAVENOUS | Status: AC
  Administered 2017-02-04: 5 mg via INTRAVENOUS
  Filled 2017-02-04: qty 5

## 2017-02-04 MED ORDER — ASPIRIN EC 81 MG PO TBEC
81.0000 mg | DELAYED_RELEASE_TABLET | Freq: Every day | ORAL | Status: DC
  Administered 2017-02-04 – 2017-02-11 (×8): 81 mg via ORAL
  Filled 2017-02-04 (×8): qty 1

## 2017-02-04 MED ORDER — SODIUM CHLORIDE 0.9 % IV SOLN: 250.0000 mL | INTRAVENOUS | Status: DC | PRN

## 2017-02-04 MED ORDER — ASPIRIN-CAFFEINE 500-32.5 MG PO TABS: 1.0000 | ORAL_TABLET | Freq: Every day | ORAL | Status: DC | PRN

## 2017-02-04 MED ORDER — FUROSEMIDE 10 MG/ML IJ SOLN
40.0000 mg | Freq: Once | INTRAMUSCULAR | Status: AC
  Administered 2017-02-04: 40 mg via INTRAVENOUS
  Filled 2017-02-04: qty 4

## 2017-02-04 MED ORDER — METOPROLOL TARTRATE 5 MG/5ML IV SOLN
2.5000 mg | Freq: Once | INTRAVENOUS | Status: DC
  Filled 2017-02-04: qty 5

## 2017-02-04 MED ORDER — ALBUTEROL SULFATE (2.5 MG/3ML) 0.083% IN NEBU: 2.5000 mg | INHALATION_SOLUTION | Freq: Four times a day (QID) | RESPIRATORY_TRACT | Status: DC | PRN

## 2017-02-04 NOTE — ED Notes (Signed)
Pt is aware that a urine sample is needed and has a urinal at the bedside but is unable to provide one at this time.

## 2017-02-04 NOTE — ED Notes (Signed)
[facility] called. 

## 2017-02-04 NOTE — Progress Notes (Signed)
Patient arrived to 3e07 via [facility].  Patient is alert and oriented, no complaints of pain. Vitals obtained and patient placed on a tele and CCMD called.  Patient oriented to room and call bell.  Will continue to monitor.

## 2017-02-04 NOTE — Consult Note (Signed)
Cardiology Consultation:   Patient ID: Kevin [name]; [id number]; [birth year]   Admit date: 02/04/2017 Date of Consult: 02/04/2017  Primary Care Provider: [name], FNP Primary Cardiologist: [facility] Primary Electrophysiologist:     Patient Profile:   Kevin [name] is a 61 y.o. male with a hx of atrial fib, CHF  who is being seen today for the evaluation of ATrial fib with RVR  at the request of Dr. Erma Heritage.  History of Present Illness:   Kevin [name] is a 61 year old gentleman with a history of congestive heart failure and atrial fibrillation. I saw him in February with rapid atrial fibrillation and heart failure symptoms.  He presents with several days of leg swelling and abdominal swelling. Admits to being noncompliant with his diet. Eats lots of salty foods on a consistent basis - although he's been trying to cut back.  When he was discharged from the hospital, he was to take Lasix 40 mg twice a day. He found out that he was unable to work very effectively after taking Lasix so he decided to skip the morning dose and double up at night taking 80 mg just once a day.  He's been slowly reaccumulating fluid.  He denies any chest pain. Initial troponin level is negative.  Echocardiogram from 09/16/2016 shows severely reduced left ventricular systolic function with an ejection fraction of 20-25%. He had moderate mitral regurgitation. There was mild to moderate pulmonary hypertension with an estimated PA pressure of 38 mmHg    Past Medical History:  Diagnosis Date  . Asthma   . Chronic systolic CHF (congestive heart failure) (HCC)    a. 06/2015 Echo: EF 25-30%, mod-sev MR, PASP . b. 08/2016: echo showing EF of 20-25% with diffuse HK  . Moderate to Severe Mitral Regurgitation    a. 06/2015 Echo: Mod-Sev MR. b. 08/2016: echo showing moderate MR.   Marland Kitchen NICM (nonischemic cardiomyopathy) (HCC)    a. 06/2015 Echo: EF 25-30%, normal cors by cath - ? Tachy-mediated;    .  Non-obstructive CAD    a. 06/2015 Cath: LM nl, LAD 30m, LCX nl, RCA nl, EF 25-30%.  Marland Kitchen PAF (paroxysmal atrial fibrillation) (HCC)     Past Surgical History:  Procedure Laterality Date  . CARDIAC CATHETERIZATION N/A 07/26/2015   Procedure: Left Heart Cath and Coronary Angiography;  Surgeon: Corky Crafts, MD;  Location: [facility] INVASIVE CV LAB;  Service: Cardiovascular;  Laterality: N/A;     Inpatient Medications: Scheduled Meds: . apixaban  5 mg Oral BID   Continuous Infusions:  PRN Meds:   Allergies:   No Known Allergies  Social History:   Social History   Social History  . Marital status: Single    Spouse name: N/A  . Number of children: N/A  . Years of education: N/A   Occupational History  . Not on file.   Social History Main Topics  . Smoking status: Former Smoker    Packs/day: 0.50    Years: 40.00    Types: Cigarettes  . Smokeless tobacco: Never Used     Comment: Quit in 2014  . Alcohol use 0.0 oz/week     Comment: 2-3 drinks per week.  . Drug use: Yes     Comment: Not currently. Quit in 2014. 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  . Sexual activity: No   Other Topics Concern  . Not on file   Social History Narrative  . No narrative on file    Family History:   The patient's family  history includes Hypertension in his father.  ROS:  Please see the history of present illness.  ROS  All other ROS reviewed and negative.     Physical Exam/Data:   Vitals:   02/04/17 0831 02/04/17 0930 02/04/17 1035 02/04/17 1100  BP:  (!) 115/93 (!) 105/92 (!) 109/95  Pulse:  (!) 141 (!) 110 61  Resp:  17 15 14   Temp:      TempSrc:      SpO2:  97% 96% 97%  Weight: 220 lb (99.8 kg)     Height: 6\' 1"  (1.854 m)      No intake or output data in the 24 hours ending 02/04/17 1306 Filed Weights   02/04/17 0831  Weight: 220 lb (99.8 kg)   Body mass index is 29.03 kg/m.  General:  Well nourished, well developed, in no acute distress HEENT: normal Lymph: no  adenopathy Neck:   + JVD  Endocrine:  No thryomegaly Vascular: No carotid bruits; FA pulses 2+ bilaterally without bruits  Cardiac:  Irreg. Irreg.  Tachycardic  Lungs:   Clear anteriorly  Abd: distended.   + abdominal wall edema  Ext:  3+ pitting edema up past his thighs  Musculoskeletal:  No deformities, BUE and BLE strength normal and equal Skin: warm and dry  Neuro:  CNs 2-12 intact, no focal abnormalities noted Psych:  Normal affect   EKG:  The EKG was personally reviewed and demonstrates:   Atrial fib with RVR.  HR 153.   NS St / T wave abn    Telemetry:  Telemetry was personally reviewed and demonstrates:  Afib with HR of  130   Relevant CV Studies:   Laboratory Data:  Chemistry Recent Labs Lab 02/04/17 0905  NA 141  K 4.4  CL 104  CO2 26  GLUCOSE 110*  BUN 24*  CREATININE 1.41*  CALCIUM 8.9  GFRNONAA 52*  GFRAA >60  ANIONGAP 11     Recent Labs Lab 02/04/17 0905  PROT 6.8  ALBUMIN 3.3*  AST 32  ALT 29  ALKPHOS 95  BILITOT 1.8*   Hematology Recent Labs Lab 02/04/17 0905  WBC 11.5*  RBC 4.93  HGB 13.5  HCT 41.1  MCV 83.4  MCH 27.4  MCHC 32.8  RDW 14.4  PLT 218   Cardiac EnzymesNo results for input(s): TROPONINI in the last 168 hours.  Recent Labs Lab 02/04/17 1207  TROPIPOC 0.04    BNP Recent Labs Lab 02/04/17 0905  BNP 655.0*    DDimer No results for input(s): DDIMER in the last 168 hours.  Radiology/Studies:  Dg Abdomen Acute W/chest  Result Date: 02/04/2017 CLINICAL DATA:  Increased abd discomfort, bloating over last1-2 months with recent constipation, cough, prev smoker EXAM: DG ABDOMEN ACUTE W/ 1V CHEST COMPARISON:  09/18/2016 FINDINGS: Mild cardiomegaly. Tortuous thoracic aorta. Blunting of the right lateral costophrenic angle suggesting small effusion. No free air. Normal bowel gas pattern. Bilateral pelvic phleboliths. Regional bones unremarkable. IMPRESSION: Cardiomegaly with possible small  right pleural effusion. Normal  bowel gas pattern.  No free air. Electronically Signed   By: Corlis Leak M.D.   On: 02/04/2017 09:33    Assessment and Plan:   1.  Acute on chronic systolic congestive heart failure: Patient has a known history of chronic systolic congestive heart failure. He was not taking his Lasix properly. He was doubling up his Lasix at night instead of taking it twice a day. I explained him that this was not an equivalent dosing scheme.  In addition, he still eating some additional salt because he works at [employer]   We Kevin start him on Lasix 40 mg IV twice a day. He'll also need additional potassium supplementation.  2. Atrial fibrillation: He has had persistent atrial fibrillation for several months. His rate is rapid likely because of his congestive heart failure. We can use a diltiazem drip short-term to maintain rate control and gradually titrate up his beta blocker. I suspect that his rate Kevin be better controlled after his heart failure has  improved.   Signed, Kristeen Miss, MD  02/04/2017 1:06 PM

## 2017-02-04 NOTE — ED Provider Notes (Signed)
[facility]-EMERGENCY DEPT Provider Note   CSN: [id number] Arrival date & time: 02/04/17  1324     History   Chief Complaint Chief Complaint  Patient presents with  . Bloated  . Nausea    HPI Kevin [name] is a 61 y.o. male.  HPI   61 yo M with PMHx as below including sCHF, NICM, CAD, pAFib here with worsening abdominal distension and leg swelling. Pt states that over the past 2 weeks, he has developed progressively worsening bilateral leg swelling. He has also noticed increased abdominal swelling/distension with associated early satiety and fullness feeling, as well as a sensation of being constipated despite having regular BM. Denies any overt abdominal pain and feels mostly "fullness" in his abdomen. He has had associated dry cough and orthopnea, and is sleeping on more pillows than usual. He has gained 9 lb over past 2 weeks per report. No fever or chills. He has not changed or missed his lasix dose per report.  Past Medical History:  Diagnosis Date  . Asthma   . Chronic systolic CHF (congestive heart failure) (HCC)    a. 06/2015 Echo: EF 25-30%, mod-sev MR, PASP . b. 08/2016: echo showing EF of 20-25% with diffuse HK  . Moderate to Severe Mitral Regurgitation    a. 06/2015 Echo: Mod-Sev MR. b. 08/2016: echo showing moderate MR.   Marland Kitchen NICM (nonischemic cardiomyopathy) (HCC)    a. 06/2015 Echo: EF 25-30%, normal cors by cath - ? Tachy-mediated;    . Non-obstructive CAD    a. 06/2015 Cath: LM nl, LAD 40m, LCX nl, RCA nl, EF 25-30%.  Marland Kitchen PAF (paroxysmal atrial fibrillation) [facility])     Patient Active Problem List   Diagnosis Date Noted  . CHF (congestive heart failure) (HCC) 02/04/2017  . Longstanding persistent atrial fibrillation (HCC) 10/19/2016  . AKI (acute kidney injury) (HCC) 10/19/2016  . Mitral regurgitation 09/28/2016  . Essential hypertension 09/28/2016  . Acute on chronic systolic heart failure (HCC) 09/15/2016  . Non-ischemic cardiomyopathy (HCC)   . Coronary  artery disease involving native coronary artery of native heart without angina pectoris   . Chronic systolic CHF (congestive heart failure) (HCC) 07/26/2015  . Paroxysmal atrial fibrillation (HCC) 07/23/2015  . COPD (chronic obstructive pulmonary disease) (HCC) 07/23/2015  . Elevated troponin 07/23/2015  . Elevated brain natriuretic peptide (BNP) level 07/23/2015    Past Surgical History:  Procedure Laterality Date  . CARDIAC CATHETERIZATION N/A 07/26/2015   Procedure: Left Heart Cath and Coronary Angiography;  Surgeon: Corky Crafts, MD;  Location: [facility] INVASIVE CV LAB;  Service: Cardiovascular;  Laterality: N/A;       Home Medications    Prior to Admission medications   Medication Sig Start Date End Date Taking? Authorizing Provider  albuterol (PROVENTIL HFA;VENTOLIN HFA) 108 (90 Base) MCG/ACT inhaler Inhale 2 puffs into the lungs every 6 (six) hours as needed for wheezing or shortness of breath. 08/20/15  Yes Newman Nip, NP  apixaban (ELIQUIS) 5 MG TABS tablet Take 1 tablet (5 mg total) by mouth 2 (two) times daily. 10/12/16  Yes [name], FNP  Aspirin-Caffeine (BAYER BACK & BODY) 500-32.5 MG TABS Take 1 tablet by mouth daily as needed (for pain).   Yes [provider]  carvedilol (COREG) 12.5 MG tablet Take 1 tablet (12.5 mg total) by mouth 2 (two) times daily with a meal. 09/22/16  Yes Sharl Ma, Sarina Ill, MD  furosemide (LASIX) 40 MG tablet Take 1 tablet (40 mg total) by mouth 2 (two)  times daily. 09/22/16  Yes Meredeth Ide, MD  lansoprazole (PREVACID) 30 MG capsule Take 30 mg by mouth daily as needed (for heartburn/indigestion).   Yes [provider]  losartan (COZAAR) 25 MG tablet Take 1 tablet (25 mg total) by mouth daily. 10/19/16  Yes Hilty, Lisette Abu, MD  potassium chloride SA (K-DUR,KLOR-CON) 20 MEQ tablet Take 1 tablet (20 mEq total) by mouth daily. Patient not taking: Reported on 02/04/2017 09/22/16   Meredeth Ide, MD    Family History Family  History  Problem Relation Age of Onset  . Hypertension Father     Social History Social History  Substance Use Topics  . Smoking status: Former Smoker    Packs/day: 0.50    Years: 40.00    Types: Cigarettes  . Smokeless tobacco: Never Used     Comment: Quit in 2014  . Alcohol use 0.0 oz/week     Comment: 2-3 drinks per week.     Allergies   Patient has no known allergies.   Review of Systems Review of Systems  Constitutional: Positive for appetite change and fatigue.  Respiratory: Positive for cough and shortness of breath.   Cardiovascular: Positive for leg swelling.  Gastrointestinal: Positive for abdominal distention and nausea.  All other systems reviewed and are negative.    Physical Exam Updated Vital Signs BP (!) 113/93 (BP Location: Right Arm)   Pulse 95   Temp 98.4 F (36.9 C) (Oral)   Resp 15   Ht 6' 1.5" (1.867 m)   Wt 106.3 kg (234 lb 5.6 oz) Comment: scale A  SpO2 98%   BMI 30.50 kg/m   Physical Exam  Constitutional: He is oriented to person, place, and time. He appears well-developed and well-nourished. No distress.  HENT:  Head: Normocephalic and atraumatic.  Eyes: Pupils are equal, round, and reactive to light. Conjunctivae are normal.  Neck: Neck supple. JVD present.  Cardiovascular: Normal rate, regular rhythm and normal heart sounds.  Exam reveals no friction rub.   No murmur heard. Pulmonary/Chest: Effort normal. No respiratory distress. He has no wheezes. He has rales (bibasilar).  Abdominal: Soft. He exhibits distension. There is no tenderness. There is no guarding.  Musculoskeletal: He exhibits edema (3+ pitting edema b/l LE).  Neurological: He is alert and oriented to person, place, and time. He exhibits normal muscle tone.  Skin: Skin is warm. Capillary refill takes less than 2 seconds.  Psychiatric: He has a normal mood and affect.  Nursing note and vitals reviewed.    ED Treatments / Results  Labs (all labs ordered are  listed, but only abnormal results are displayed) Labs Reviewed  COMPREHENSIVE METABOLIC PANEL - Abnormal; Notable for the following:       Result Value   Glucose, Bld 110 (*)    BUN 24 (*)    Creatinine, Ser 1.41 (*)    Albumin 3.3 (*)    Total Bilirubin 1.8 (*)    GFR calc non Af Amer 52 (*)    All other components within normal limits  CBC - Abnormal; Notable for the following:    WBC 11.5 (*)    All other components within normal limits  URINALYSIS, ROUTINE W REFLEX MICROSCOPIC - Abnormal; Notable for the following:    Protein, ur 100 (*)    Squamous Epithelial / LPF 0-5 (*)    All other components within normal limits  BRAIN NATRIURETIC PEPTIDE - Abnormal; Notable for the following:    B Natriuretic Peptide 655.0 (*)  All other components within normal limits  LIPASE, BLOOD  BASIC METABOLIC PANEL  BASIC METABOLIC PANEL  I-STAT TROPOININ, ED    EKG Atrial fibrillation with rapid ventricular rate. Non-specific, diffuse ST-t changes likely demand related. Rate increased significantly since prior. No ST elevations.  Radiology Dg Abdomen Acute W/chest  Result Date: 02/04/2017 CLINICAL DATA:  Increased abd discomfort, bloating over last1-2 months with recent constipation, cough, prev smoker EXAM: DG ABDOMEN ACUTE W/ 1V CHEST COMPARISON:  09/18/2016 FINDINGS: Mild cardiomegaly. Tortuous thoracic aorta. Blunting of the right lateral costophrenic angle suggesting small effusion. No free air. Normal bowel gas pattern. Bilateral pelvic phleboliths. Regional bones unremarkable. IMPRESSION: Cardiomegaly with possible small  right pleural effusion. Normal bowel gas pattern.  No free air. Electronically Signed   By: Corlis Leak M.D.   On: 02/04/2017 09:33    Procedures .Critical Care Performed by: Shaune Pollack Authorized by: Shaune Pollack     (including critical care time)  CRITICAL CARE Performed by: Dollene Cleveland   Total critical care time: 35 minutes  Critical care  time was exclusive of separately billable procedures and treating other patients.  Critical care was necessary to treat or prevent imminent or life-threatening deterioration.  Critical care was time spent personally by me on the following activities: development of treatment plan with patient and/or surrogate as well as nursing, discussions with consultants, evaluation of patient's response to treatment, examination of patient, obtaining history from patient or surrogate, ordering and performing treatments and interventions, ordering and review of laboratory studies, ordering and review of radiographic studies, pulse oximetry and re-evaluation of patient's condition.    Medications Ordered in ED Medications  apixaban (ELIQUIS) tablet 5 mg (5 mg Oral Given 02/04/17 1217)  diltiazem (CARDIZEM) 1 mg/mL load via infusion 5 mg (5 mg Intravenous Bolus from Bag 02/04/17 1323)    And  diltiazem (CARDIZEM) 100 mg in dextrose 5% (1 mg/mL) infusion (12.5 mg/hr Intravenous Rate/Dose Change 02/04/17 1500)  furosemide (LASIX) injection 40 mg (40 mg Intravenous Given 02/04/17 1955)  potassium chloride SA (K-DUR,KLOR-CON) CR tablet 20 mEq (20 mEq Oral Given 02/04/17 1336)  albuterol (PROVENTIL) (2.5 MG/3ML) 0.083% nebulizer solution 2.5 mg (not administered)  pantoprazole (PROTONIX) EC tablet 20 mg (not administered)  carvedilol (COREG) tablet 12.5 mg (not administered)  apixaban (ELIQUIS) tablet 5 mg (not administered)  losartan (COZAAR) tablet 25 mg (25 mg Oral Given 02/04/17 1955)  sodium chloride flush (NS) 0.9 % injection 3 mL (3 mLs Intravenous Given 02/04/17 1956)  sodium chloride flush (NS) 0.9 % injection 3 mL (not administered)  0.9 %  sodium chloride infusion (not administered)  acetaminophen (TYLENOL) tablet 650 mg (not administered)  ondansetron (ZOFRAN) injection 4 mg (not administered)  aspirin EC tablet 81 mg (81 mg Oral Given 02/04/17 1955)  metoprolol tartrate (LOPRESSOR) injection 2.5 mg  (2.5 mg Intravenous Given 02/04/17 0958)  furosemide (LASIX) injection 40 mg (40 mg Intravenous Given 02/04/17 1015)  metoprolol tartrate (LOPRESSOR) injection 5 mg (5 mg Intravenous Given 02/04/17 1033)  metoprolol tartrate (LOPRESSOR) injection 5 mg (5 mg Intravenous Given 02/04/17 1158)     Initial Impression / Assessment and Plan / ED Course  I have reviewed the triage vital signs and the nursing notes.  Pertinent labs & imaging results that were available during my care of the patient were reviewed by me and considered in my medical decision making (see chart for details).     61 yo M with PMHx as above, including NICM with sCHF (EF20-25%)  here with worsening abdominal and leg swelling, DOE, and orthopnea. Suspect acute CHF exacerbation, likely 2/2 dietary indiscretion. CXR without significant edema but BNP elevated and pt noted to be in AFib RVR on arrival, likely 2/2 hypervolemia. Pt given lopressor IV x 3 with slight improvement in rate but persistent RVR. Will consult Cardiology for recommendations re: further rate control, likely admission.  Pt started on dilt bolus and gtt per Cardiology. Admit to [facility] for diuresis, management of afib rvr. Home dose of eliquis given here.  Final Clinical Impressions(s) / ED Diagnoses   Final diagnoses:  Atrial fibrillation with rapid ventricular response (HCC)  Acute on chronic systolic congestive heart failure [facility])    New Prescriptions Current Discharge Medication List       Shaune Pollack, MD 02/04/17 2036

## 2017-02-04 NOTE — ED Triage Notes (Signed)
Pt complaint of generalized abdominal bloating with associated nausea worsening over past few months; verbalizes recent constipation and SOB related to "belly so full."

## 2017-02-05 DIAGNOSIS — I482 Chronic atrial fibrillation: Secondary | ICD-10-CM

## 2017-02-05 LAB — BASIC METABOLIC PANEL
Anion gap: 8 (ref 5–15)
BUN: 24 mg/dL — ABNORMAL HIGH (ref 6–20)
CALCIUM: 8.8 mg/dL — AB (ref 8.9–10.3)
CO2: 29 mmol/L (ref 22–32)
Chloride: 102 mmol/L (ref 101–111)
Creatinine, Ser: 1.42 mg/dL — ABNORMAL HIGH (ref 0.61–1.24)
GFR, EST AFRICAN AMERICAN: 60 mL/min — AB (ref >60)
GFR, EST NON AFRICAN AMERICAN: 51 mL/min — AB (ref >60)
Glucose, Bld: 107 mg/dL — ABNORMAL HIGH (ref 65–99)
POTASSIUM: 3.8 mmol/L (ref 3.5–5.1)
SODIUM: 139 mmol/L (ref 135–145)

## 2017-02-05 NOTE — Progress Notes (Signed)
Patient stable on 7p-7a shift. Patient maintained hr 80's on Cardizem drip. No complaints of pain.

## 2017-02-05 NOTE — Care Management Note (Signed)
Case Management Note  Patient Details  Name: Kevin [name] MRN: [id number] Date of Birth: 1955/09/20  Subjective/Objective:  CHF                 Action/Plan: Patient is independent if ll of his ADL's, lives in a [facility] for 2 yrs; works full time at [employer] but cannot afford [employer]. [facility] and his pharmacy of choice is [facility]; he knows that he can go to the [facility] for medication but chose not to at this time; Nutritional  Consult placed for making wise food choices.  Expected Discharge Date: possibly 02/08/2017             Expected Discharge Plan:  Home/Self Care  Discharge planning Services  CM Consult  Status of Service:  In process, will continue to follow  Reola Mosher [phone] 02/05/2017, 11:04 AM

## 2017-02-05 NOTE — Progress Notes (Signed)
Received consult that patient lives at [facility] [employer] [facility] house- no CSW involvement needed to return to this level of care  CSW signing off  Burna Sis, LCSW Clinical Social Worker [phone]

## 2017-02-05 NOTE — Progress Notes (Signed)
Patient's blood pressure is 89/76 and 81/60.  Nada Boozer, PA made aware. Cardizem stopped.  Will continue to monitor.

## 2017-02-05 NOTE — Progress Notes (Signed)
Nutrition Education Note  RD consulted for nutrition education regarding new onset CHF.  Pt reports having prior education regarding sodium intake, but asked for additional resources.RD provided "Low Sodium Nutrition Therapy" handout from the Academy of [employer] and [employer]etics. Reviewed patient's dietary recall. Provided examples on ways to decrease sodium intake in diet. Discouraged intake of processed foods and use of salt shaker. Encouraged fresh fruits and vegetables as well as whole grain sources of carbohydrates to maximize fiber intake.   RD discussed why it is important for patient to adhere to diet recommendations, and emphasized the role of fluids, foods to avoid, and importance of weighing self daily. Teach back method used.  Expect fair compliance.  Body mass index is 30.62 kg/m. Pt meets criteria for obese based on current BMI.  Current diet order is 2 gram sodium restriction, patient is consuming approximately 100% of meals at this time. Labs and medications reviewed. No further nutrition interventions warranted at this time. RD contact information provided. If additional nutrition issues arise, please re-consult RD.   Vanessa Kick RD, LDN Pager # - [phone]

## 2017-02-05 NOTE — Discharge Instructions (Signed)

## 2017-02-05 NOTE — Progress Notes (Signed)
I stopped by to discuss and educate Kevin [name] regarding HF recommendations for home.   He was sound asleep-therefore I will plan to return at another time to do education.

## 2017-02-05 NOTE — Progress Notes (Addendum)
Progress Note  Patient Name: Kevin [name] Date of Encounter: 02/05/2017  Primary Cardiologist: Dr. [name]   Subjective   Better but still SOB.  We discussed what to watch for other than wt gain for volume overload.  Inpatient Medications    Scheduled Meds: . apixaban  5 mg Oral BID  . aspirin EC  81 mg Oral Daily  . carvedilol  12.5 mg Oral BID WC  . furosemide  40 mg Intravenous Q12H  . losartan  25 mg Oral Daily  . pantoprazole  20 mg Oral Daily  . potassium chloride  20 mEq Oral Daily  . sodium chloride flush  3 mL Intravenous Q12H   Continuous Infusions: . sodium chloride     PRN Meds: sodium chloride, acetaminophen, albuterol, ondansetron (ZOFRAN) IV, sodium chloride flush   Vital Signs    Vitals:   02/05/17 0508 02/05/17 1010 02/05/17 1149 02/05/17 1150  BP: 91/62 93/65 (!) 81/60 (!) 89/76  Pulse: 75 66 65 63  Resp: 16   18  Temp: 97.6 F (36.4 C)     TempSrc: Oral     SpO2: 98%     Weight: 235 lb 4.8 oz (106.7 kg)     Height:        Intake/Output Summary (Last 24 hours) at 02/05/17 1231 Last data filed at 02/05/17 0856  Gross per 24 hour  Intake              520 ml  Output              750 ml  Net             -230 ml   Filed Weights   02/04/17 0831 02/04/17 1834 02/05/17 0508  Weight: 220 lb (99.8 kg) 234 lb 5.6 oz (106.3 kg) 235 lb 4.8 oz (106.7 kg)    Telemetry    A fib rate controlled - Personally Reviewed  ECG    No new - Personally Reviewed  Physical Exam   GEN: No acute distress.  But upright in bed due to SOB Neck: + JVD Cardiac: irreg irreg, no murmurs, rubs, or gallops.  Respiratory: breath sounds present to auscultation bilaterally but significantly diminished in bases.  Few rales, no wheezes or rhonchi. GI: Soft, nontender, non-distended but fuller than his usual  MS: + edema bil to just below knees but improved per pt.; No deformity. Neuro:  Nonfocal  Psych: Normal affect   Labs    Chemistry Recent Labs Lab  02/04/17 0905 02/05/17 0259  NA 141 139  K 4.4 3.8  CL 104 102  CO2 26 29  GLUCOSE 110* 107*  BUN 24* 24*  CREATININE 1.41* 1.42*  CALCIUM 8.9 8.8*  PROT 6.8  --   ALBUMIN 3.3*  --   AST 32  --   ALT 29  --   ALKPHOS 95  --   BILITOT 1.8*  --   GFRNONAA 52* 51*  GFRAA >60 60*  ANIONGAP 11 8     Hematology Recent Labs Lab 02/04/17 0905  WBC 11.5*  RBC 4.93  HGB 13.5  HCT 41.1  MCV 83.4  MCH 27.4  MCHC 32.8  RDW 14.4  PLT 218    Cardiac EnzymesNo results for input(s): TROPONINI in the last 168 hours.  Recent Labs Lab 02/04/17 1207  TROPIPOC 0.04     BNP Recent Labs Lab 02/04/17 0905  BNP 655.0*     DDimer No results for input(s): DDIMER in the  last 168 hours.   Radiology    Dg Abdomen Acute W/chest  Result Date: 02/04/2017 CLINICAL DATA:  Increased abd discomfort, bloating over last1-2 months with recent constipation, cough, prev smoker EXAM: DG ABDOMEN ACUTE W/ 1V CHEST COMPARISON:  09/18/2016 FINDINGS: Mild cardiomegaly. Tortuous thoracic aorta. Blunting of the right lateral costophrenic angle suggesting small effusion. No free air. Normal bowel gas pattern. Bilateral pelvic phleboliths. Regional bones unremarkable. IMPRESSION: Cardiomegaly with possible small  right pleural effusion. Normal bowel gas pattern.  No free air. Electronically Signed   By: Corlis Leak M.D.   On: 02/04/2017 09:33    Cardiac Studies   Last echo in Feb with EF 20-25% with mod MR. PA pressure 38 mmHg  Patient Profile     61 y.o. male with a hx of atrial fib, CHF who was admitted for ATrial fib with RVR and HF.  Was seen at [facility] but transferred to [facility] for admit.   Echocardiogram from 09/16/2016 shows severely reduced left ventricular systolic function with an ejection fraction of 20-25%. He had moderate mitral regurgitation. There was mild to moderate pulmonary hypertension with an estimated PA pressure of 38 mmHg  Assessment & Plan    Acute on chronic systolic CHF- with  known EF 20-25% in Feb. He is taking his lasix double dose at night instead of BID at home.  He was also eating salt with working at [employer].    BNP on admit 655 ---On lasix IV BID now BP 89 systolic. He is neg 230 ml.  --- wt up 35 lbs from dry wt. Though wt at OV in March was 213 --- still with HF once BP stable increase lasix to 80 mg BID -Dr. Anne Fu to see.  Long standing Atrial fib persistent with RVR on admit IV dilt was added short term to control rate and increase BB.  Coreg is 12.5 BID  --rate today in 60s and with hypotension IV dilt stopped --Eliquis for anticoagulation  --CHADSVASC score 5 on Eliquis  NICM with EF 20-25%   --on coreg BID, losartan 25 mg added daily in March.  Plan for echo in Sept.  , dry wt is about 200 lbs.   CAD 40% LAD disease on cath 07/26/15, neg troponin  CKD-3 Cr 1.42 today  Hypotension  On dilt drip systolic BP 89 have d/c'd dilt. Will have RN recheck BP if improved needs higher dose of lasix. Pt without dizziness    Signed, Nada Boozer, NP  02/05/2017, 12:31 PM    Personally seen and examined. Agree with above.  Feels better but still dyspnic Lungs diminished bases, rhochi B Irreg irreg rate controlled. Edema BLE  Acute on chronic systolic HF  - BP soft. Diltiazem stopped. Let's see how he does once this washes out. Has neg inotropic qualities  - continue IV lasix once BP improved  - may need milrinone/ HF team assistance in future   - EF chronically low   AFIB, perm  - Eliquis  - Heart rate currently < 100  Donato Schultz, MD

## 2017-02-06 DIAGNOSIS — I4891 Unspecified atrial fibrillation: Secondary | ICD-10-CM

## 2017-02-06 LAB — BASIC METABOLIC PANEL
Anion gap: 7 (ref 5–15)
BUN: 29 mg/dL — ABNORMAL HIGH (ref 6–20)
CALCIUM: 8.8 mg/dL — AB (ref 8.9–10.3)
CHLORIDE: 101 mmol/L (ref 101–111)
CO2: 29 mmol/L (ref 22–32)
Creatinine, Ser: 1.46 mg/dL — ABNORMAL HIGH (ref 0.61–1.24)
GFR calc Af Amer: 58 mL/min — ABNORMAL LOW (ref >60)
GFR calc non Af Amer: 50 mL/min — ABNORMAL LOW (ref >60)
GLUCOSE: 92 mg/dL (ref 65–99)
Potassium: 3.8 mmol/L (ref 3.5–5.1)
Sodium: 137 mmol/L (ref 135–145)

## 2017-02-06 MED ORDER — METOPROLOL TARTRATE 50 MG PO TABS
50.0000 mg | ORAL_TABLET | Freq: Two times a day (BID) | ORAL | Status: DC
  Administered 2017-02-06 – 2017-02-07 (×2): 50 mg via ORAL
  Filled 2017-02-06 (×3): qty 1

## 2017-02-06 MED ORDER — FUROSEMIDE 10 MG/ML IJ SOLN
80.0000 mg | Freq: Two times a day (BID) | INTRAMUSCULAR | Status: DC
  Administered 2017-02-06 – 2017-02-10 (×8): 80 mg via INTRAVENOUS
  Filled 2017-02-06 (×8): qty 8

## 2017-02-06 NOTE — Progress Notes (Signed)
Patient stable during 7 a to 7 p shift, Heart rate in the 110's to 120's despite Coreg, Lopressor added, dose given.  BP within normal limits.  Patient continues to diurese.,  Requested prune juice for bowels as no BM since Wednesday, no success as of the time of this note and did not wish any other intervention at this time. Son in to visit with patient today.

## 2017-02-06 NOTE — Progress Notes (Signed)
Progress Note  Patient Name: Kevin [name] Date of Encounter: 02/06/2017  Primary Cardiologist: Dr. [name]  Subjective   Feeling short of breath.  Denies orthopnea, chest pain or palpitations  Inpatient Medications    Scheduled Meds: . apixaban  5 mg Oral BID  . aspirin EC  81 mg Oral Daily  . carvedilol  12.5 mg Oral BID WC  . furosemide  40 mg Intravenous Q12H  . losartan  25 mg Oral Daily  . pantoprazole  20 mg Oral Daily  . potassium chloride  20 mEq Oral Daily  . sodium chloride flush  3 mL Intravenous Q12H   Continuous Infusions: . sodium chloride     PRN Meds: sodium chloride, acetaminophen, albuterol, ondansetron (ZOFRAN) IV, sodium chloride flush   Vital Signs    Vitals:   02/06/17 0501 02/06/17 0826 02/06/17 0900 02/06/17 1100  BP: 102/76  108/62 108/62  Pulse: 63 (!) 113 (!) 108 (!) 50  Resp: 17   20  Temp: 97.7 F (36.5 C)   99 F (37.2 C)  TempSrc: Oral   Oral  SpO2: 100%  98% 99%  Weight: 107 kg (235 lb 14.4 oz)     Height:        Intake/Output Summary (Last 24 hours) at 02/06/17 1509 Last data filed at 02/06/17 1444  Gross per 24 hour  Intake              720 ml  Output             2050 ml  Net            -1330 ml   Filed Weights   02/04/17 1834 02/05/17 0508 02/06/17 0501  Weight: 106.3 kg (234 lb 5.6 oz) 106.7 kg (235 lb 4.8 oz) 107 kg (235 lb 14.4 oz)    Telemetry    Atrial fibrillation.  Rates 100s-120s - Personally Reviewed  ECG    n/a - Personally Reviewed  Physical Exam   VS:  BP 108/62 (BP Location: Left Arm)   Pulse (!) 50   Temp 99 F (37.2 C) (Oral)   Resp 20   Ht 6' 1.5" (1.867 m)   Wt 107 kg (235 lb 14.4 oz)   SpO2 99%   BMI 30.70 kg/m  , BMI Body mass index is 30.7 kg/m. GENERAL:  Well appearing.  No acute distress HEENT: Pupils equal round and reactive, fundi not visualized, oral mucosa unremarkable NECK:  JVP to mid neck at 45 degrees, waveform within normal limits, carotid upstroke brisk and symmetric, no  bruits LUNGS:  Diminished at bilateral bases HEART:  Irregularly irregular  PMI not displaced or sustained,S1 and S2 within normal limits, no S3, no S4, no clicks, no rubs, no murmurs ABD:  Flat, positive bowel sounds normal in frequency in pitch, no bruits, no rebound, no guarding, no midline pulsatile mass, no hepatomegaly, no splenomegaly EXT:  2 plus pulses throughout, 2+ pitting edema, no cyanosis no clubbing SKIN:  No rashes no nodules NEURO:  Cranial nerves II through XII grossly intact, motor grossly intact throughout [facility]:  Cognitively intact, oriented to person place and time  Labs    Chemistry Recent Labs Lab 02/04/17 0905 02/05/17 0259 02/06/17 0447  NA 141 139 137  K 4.4 3.8 3.8  CL 104 102 101  CO2 26 29 29   GLUCOSE 110* 107* 92  BUN 24* 24* 29*  CREATININE 1.41* 1.42* 1.46*  CALCIUM 8.9 8.8* 8.8*  PROT 6.8  --   --  ALBUMIN 3.3*  --   --   AST 32  --   --   ALT 29  --   --   ALKPHOS 95  --   --   BILITOT 1.8*  --   --   GFRNONAA 52* 51* 50*  GFRAA >60 60* 58*  ANIONGAP 11 8 7      Hematology Recent Labs Lab 02/04/17 0905  WBC 11.5*  RBC 4.93  HGB 13.5  HCT 41.1  MCV 83.4  MCH 27.4  MCHC 32.8  RDW 14.4  PLT 218    Cardiac EnzymesNo results for input(s): TROPONINI in the last 168 hours.  Recent Labs Lab 02/04/17 1207  TROPIPOC 0.04     BNP Recent Labs Lab 02/04/17 0905  BNP 655.0*     DDimer No results for input(s): DDIMER in the last 168 hours.   Radiology    No results found.  Cardiac Studies   Echo 09/16/16: Study Conclusions  - Left ventricle: The cavity size was mildly dilated. Wall   thickness was normal. Systolic function was severely reduced. The   estimated ejection fraction was in the range of 20% to 25%.   Diffuse hypokinesis. Indeterminant diastolic function (atrial   fibrillation). - Aortic valve: There was no stenosis. - Mitral valve: There was moderate regurgitation. - Left atrium: The atrium was severely  dilated. - Right ventricle: The cavity size was normal. Systolic function   was mildly to moderately reduced. - Right atrium: The atrium was moderately dilated. - Tricuspid valve: Peak RV-RA gradient (S): 23 mm Hg. - Pulmonary arteries: PA peak pressure: 38 mm Hg (S). - Systemic veins: IVC measured 3.3 cm with < 50% respirophasic   variation, suggesting RA pressure 15 mmHg.  Patient Profile     61 y.o. male with chronic systolic and diastolic heart failure, longstanding persistent atrial fibrillation,  Non-obstructive CAD and CKD III here with acute on chronic heart failure and afib with RVR.  Assessment & Plan    # Acute on chronic systolic and diastolic heart failure: Mr. Byrdsong remains volume overloaded and only diuresed net - yesterday.  We will increase lasix to 80mg  IV bid.  Switch carvedilol to metoprolol for improved HR control.  He would benefit from switching to [facility] as well.  Will need to contact case management and consider doing this inpatient.  This patient needs an ICD. LVEF has been reduced for years.   # Persistent atrial fibrillation with RVR: Switch from carvedilol to metoprolol as above.  Continue eliquis.  Signed, Chilton Si, MD  02/06/2017, 3:09 PM

## 2017-02-06 NOTE — Progress Notes (Signed)
Patient refused bed alarm. Will continue to monitor patient. 

## 2017-02-06 NOTE — Plan of Care (Signed)
Problem: Health Behavior/Discharge Planning: Goal: Ability to manage health-related needs will improve Outcome: Progressing Educated patient on living with heart failure booklet especially use of beta blockers and following a low sodium diet   Problem: Physical Regulation: Goal: Ability to maintain clinical measurements within normal limits will improve Outcome: Progressing BP WNL, HR 110's to 120's Lopressor added   Problem: Activity: Goal: Risk for activity intolerance will decrease Outcome: Progressing Still short of breath with exertion but improved from admission   Problem: Fluid Volume: Goal: Ability to maintain a balanced intake and output will improve Outcome: Progressing Receiving Iv Lasix for diuresis

## 2017-02-07 DIAGNOSIS — I5041 Acute combined systolic (congestive) and diastolic (congestive) heart failure: Secondary | ICD-10-CM

## 2017-02-07 LAB — BASIC METABOLIC PANEL
ANION GAP: 7 (ref 5–15)
BUN: 25 mg/dL — ABNORMAL HIGH (ref 6–20)
CHLORIDE: 99 mmol/L — AB (ref 101–111)
CO2: 32 mmol/L (ref 22–32)
Calcium: 8.9 mg/dL (ref 8.9–10.3)
Creatinine, Ser: 1.39 mg/dL — ABNORMAL HIGH (ref 0.61–1.24)
GFR calc non Af Amer: 53 mL/min — ABNORMAL LOW (ref >60)
Glucose, Bld: 82 mg/dL (ref 65–99)
Potassium: 3.9 mmol/L (ref 3.5–5.1)
Sodium: 138 mmol/L (ref 135–145)

## 2017-02-07 MED ORDER — LOSARTAN POTASSIUM 25 MG PO TABS: 12.5000 mg | ORAL_TABLET | Freq: Every day | ORAL | Status: DC

## 2017-02-07 MED ORDER — METOPROLOL TARTRATE 50 MG PO TABS
50.0000 mg | ORAL_TABLET | Freq: Three times a day (TID) | ORAL | Status: AC
  Administered 2017-02-07 – 2017-02-08 (×4): 50 mg via ORAL
  Filled 2017-02-07 (×4): qty 1

## 2017-02-07 MED ORDER — SPIRONOLACTONE 25 MG PO TABS: 12.5000 mg | ORAL_TABLET | Freq: Every day | ORAL | Status: DC

## 2017-02-07 MED ORDER — POLYETHYLENE GLYCOL 3350 17 G PO PACK
17.0000 g | PACK | Freq: Every day | ORAL | Status: DC
  Administered 2017-02-07 – 2017-02-10 (×4): 17 g via ORAL
  Filled 2017-02-07 (×5): qty 1

## 2017-02-07 NOTE — Plan of Care (Signed)
Problem: Activity: Goal: Risk for activity intolerance will decrease Outcome: Progressing Shortness of breath continues to improve   Problem: Fluid Volume: Goal: Ability to maintain a balanced intake and output will improve Outcome: Progressing Continues to diurese   Problem: Cardiac: Goal: Ability to achieve and maintain adequate cardiopulmonary perfusion will improve Outcome: Progressing Maintains oxygen saturation in the 90's on room air

## 2017-02-07 NOTE — Progress Notes (Signed)
Progress Note  Patient Name: Kevin [name] Date of Encounter: 02/07/2017  Primary Cardiologist: Dr. [name]  Subjective   Shortness of breath has improved.  Denies orthopnea, chest pain or palpitations  Inpatient Medications    Scheduled Meds: . apixaban  5 mg Oral BID  . aspirin EC  81 mg Oral Daily  . furosemide  80 mg Intravenous Q12H  . losartan  25 mg Oral Daily  . metoprolol tartrate  50 mg Oral BID  . pantoprazole  20 mg Oral Daily  . polyethylene glycol  17 g Oral Daily  . potassium chloride  20 mEq Oral Daily  . sodium chloride flush  3 mL Intravenous Q12H   Continuous Infusions: . sodium chloride     PRN Meds: sodium chloride, acetaminophen, albuterol, ondansetron (ZOFRAN) IV, sodium chloride flush   Vital Signs    Vitals:   02/06/17 2100 02/07/17 0404 02/07/17 0959 02/07/17 1207  BP: 120/72 122/66 108/78 96/76  Pulse: (!) 112 (!) 102 (!) 123 (!) 101  Resp: 18 18  18   Temp: 98.2 F (36.8 C) (!) 87.5 F (30.8 C)  98.1 F (36.7 C)  TempSrc: Oral Oral  Oral  SpO2: 94% 94% 98% 99%  Weight:  103.4 kg (227 lb 14.4 oz)    Height:        Intake/Output Summary (Last 24 hours) at 02/07/17 1258 Last data filed at 02/07/17 0912  Gross per 24 hour  Intake              960 ml  Output             4425 ml  Net            -3465 ml   Filed Weights   02/05/17 0508 02/06/17 0501 02/07/17 0404  Weight: 106.7 kg (235 lb 4.8 oz) 107 kg (235 lb 14.4 oz) 103.4 kg (227 lb 14.4 oz)    Telemetry    Atrial fibrillation.  Rates 100s-120s Occasionally 140s- Personally Reviewed  ECG    n/a - Personally Reviewed  Physical Exam   VS:  BP 96/76 (BP Location: Right Arm)   Pulse (!) 101   Temp 98.1 F (36.7 C) (Oral)   Resp 18   Ht 6' 1.5" (1.867 m)   Wt 103.4 kg (227 lb 14.4 oz)   SpO2 99%   BMI 29.66 kg/m  , BMI Body mass index is 29.66 kg/m. GENERAL:  Well appearing.  No acute distress HEENT: Pupils equal round and reactive, fundi not visualized, oral mucosa  unremarkable NECK:  JVP to mid neck sitting upright. Waveform within normal limits, carotid upstroke brisk and symmetric, no bruits LUNGS:  Diminished at bilateral bases HEART: tachycardic.  Irregularly irregular  PMI not displaced or sustained,S1 and S2 within normal limits, no S3, no S4, no clicks, no rubs, no murmurs ABD:  Flat, positive bowel sounds normal in frequency in pitch, no bruits, no rebound, no guarding, no midline pulsatile mass, no hepatomegaly, no splenomegaly EXT:  2 plus pulses throughout, 2+ pitting edema to thighs. no cyanosis no clubbing SKIN:  No rashes no nodules NEURO:  Cranial nerves II through XII grossly intact, motor grossly intact throughout PSYCH:  Cognitively intact, oriented to person place and time  Labs    Chemistry  Recent Labs Lab 02/04/17 0905 02/05/17 0259 02/06/17 0447 02/07/17 0330  NA 141 139 137 138  K 4.4 3.8 3.8 3.9  CL 104 102 101 99*  CO2 26 29 29  32  GLUCOSE 110* 107* 92 82  BUN 24* 24* 29* 25*  CREATININE 1.41* 1.42* 1.46* 1.39*  CALCIUM 8.9 8.8* 8.8* 8.9  PROT 6.8  --   --   --   ALBUMIN 3.3*  --   --   --   AST 32  --   --   --   ALT 29  --   --   --   ALKPHOS 95  --   --   --   BILITOT 1.8*  --   --   --   GFRNONAA 52* 51* 50* 53*  GFRAA >60 60* 58* >60  ANIONGAP 11 8 7 7      Hematology  Recent Labs Lab 02/04/17 0905  WBC 11.5*  RBC 4.93  HGB 13.5  HCT 41.1  MCV 83.4  MCH 27.4  MCHC 32.8  RDW 14.4  PLT 218    Cardiac EnzymesNo results for input(s): TROPONINI in the last 168 hours.   Recent Labs Lab 02/04/17 1207  TROPIPOC 0.04     BNP  Recent Labs Lab 02/04/17 0905  BNP 655.0*     DDimer No results for input(s): DDIMER in the last 168 hours.   Radiology    No results found.  Cardiac Studies   Echo 09/16/16: Study Conclusions  - Left ventricle: The cavity size was mildly dilated. Wall   thickness was normal. Systolic function was severely reduced. The   estimated ejection fraction  was in the range of 20% to 25%.   Diffuse hypokinesis. Indeterminant diastolic function (atrial   fibrillation). - Aortic valve: There was no stenosis. - Mitral valve: There was moderate regurgitation. - Left atrium: The atrium was severely dilated. - Right ventricle: The cavity size was normal. Systolic function   was mildly to moderately reduced. - Right atrium: The atrium was moderately dilated. - Tricuspid valve: Peak RV-RA gradient (S): 23 mm Hg. - Pulmonary arteries: PA peak pressure: 38 mm Hg (S). - Systemic veins: IVC measured 3.3 cm with < 50% respirophasic   variation, suggesting RA pressure 15 mmHg.  Patient Profile     61 y.o. male with chronic systolic and diastolic heart failure, longstanding persistent atrial fibrillation,  Non-obstructive CAD and CKD III here with acute on chronic heart failure and afib with RVR.  Assessment & Plan    # Acute on chronic systolic and diastolic heart failure: Mr. Rinehimer remains volume overloaded.  His diuresis increased significantly with increasing Lasix from 40 mg to 80 mg IV twice daily. He was net -3 L yesterday. His breathing is starting to improve. We will add TED hose and have encouraged him to elevate his legs when sitting.  Carvedilol was switched to metoprolol for improved heart rate control. His blood pressure with his little low and his heart rate remains elevated, though improved. We will switch losartan to 12.5 mg daily and increase metoprolol to 50 mg every 8 hours. He would benefit from switching to [facility] as well if his BP will tolerate it.   This patient needs an ICD. LVEF has been reduced for years.  Add spironolactone prior to discharge.   # Persistent atrial fibrillation with RVR: Carvedilol was switched to metoprolol this admission. Heart rates remain elevated. Increase metoprolol to 50 mg every 8 hours. Continue apixaban.  Signed, Chilton Si, MD  02/07/2017, 12:58 PM

## 2017-02-07 NOTE — Progress Notes (Signed)
Patient without complaint on 7 a to 7 p shift, continues to diurese.  Son at bedside part of shift.

## 2017-02-07 NOTE — Progress Notes (Signed)
Cardiologist on call informed that patient had manual BP 100/60 HR 107 and Metoprolol 50 mg scheduled to be given instructed to give as scheduled. Will continue to monitor

## 2017-02-08 ENCOUNTER — Encounter (HOSPITAL_COMMUNITY): Payer: Self-pay | Admitting: Physician Assistant

## 2017-02-08 DIAGNOSIS — I428 Other cardiomyopathies: Secondary | ICD-10-CM

## 2017-02-08 DIAGNOSIS — Z87891 Personal history of nicotine dependence: Secondary | ICD-10-CM

## 2017-02-08 DIAGNOSIS — F1491 Cocaine use, unspecified, in remission: Secondary | ICD-10-CM

## 2017-02-08 DIAGNOSIS — Z87898 Personal history of other specified conditions: Secondary | ICD-10-CM

## 2017-02-08 DIAGNOSIS — N182 Chronic kidney disease, stage 2 (mild): Secondary | ICD-10-CM

## 2017-02-08 LAB — RAPID URINE DRUG SCREEN, HOSP PERFORMED
AMPHETAMINES: NOT DETECTED
BARBITURATES: NOT DETECTED
Benzodiazepines: NOT DETECTED
Cocaine: NOT DETECTED
Opiates: NOT DETECTED
TETRAHYDROCANNABINOL: NOT DETECTED

## 2017-02-08 LAB — BASIC METABOLIC PANEL
Anion gap: 8 (ref 5–15)
BUN: 23 mg/dL — AB (ref 6–20)
CHLORIDE: 95 mmol/L — AB (ref 101–111)
CO2: 34 mmol/L — ABNORMAL HIGH (ref 22–32)
Calcium: 9 mg/dL (ref 8.9–10.3)
Creatinine, Ser: 1.35 mg/dL — ABNORMAL HIGH (ref 0.61–1.24)
GFR calc Af Amer: >60 mL/min (ref >60)
GFR calc non Af Amer: 55 mL/min — ABNORMAL LOW (ref >60)
GLUCOSE: 86 mg/dL (ref 65–99)
POTASSIUM: 3.9 mmol/L (ref 3.5–5.1)
Sodium: 137 mmol/L (ref 135–145)

## 2017-02-08 MED ORDER — METOPROLOL SUCCINATE ER 100 MG PO TB24
100.0000 mg | ORAL_TABLET | Freq: Every day | ORAL | Status: DC
  Administered 2017-02-10: 100 mg via ORAL
  Filled 2017-02-08 (×2): qty 1

## 2017-02-08 NOTE — H&P (Signed)
Patient ID: Kevin [name]; [id number]; 01/15/1956   Admit date: 02/04/2017 Date of Consult: 02/04/2017  Primary Care Provider: [name], FNP Primary Cardiologist: [facility] Primary Electrophysiologist:     Patient Profile:   Kevin [name] is a 61 y.o. male with a hx of atrial fib, CHF  who is being seen today for the evaluation of ATrial fib with RVR  at the request of Dr. Erma Heritage.  History of Present Illness:   Kevin [name] is a 62 year old gentleman with a history of congestive heart failure and atrial fibrillation. I saw him in February with rapid atrial fibrillation and heart failure symptoms.  He presents with several days of leg swelling and abdominal swelling. Admits to being noncompliant with his diet. Eats lots of salty foods on a consistent basis - although he's been trying to cut back.  When he was discharged from the hospital, he was to take Lasix 40 mg twice a day. He found out that he was unable to work very effectively after taking Lasix so he decided to skip the morning dose and double up at night taking 80 mg just once a day.  He's been slowly reaccumulating fluid.  He denies any chest pain. Initial troponin level is negative.  Echocardiogram from 09/16/2016 shows severely reduced left ventricular systolic function with an ejection fraction of 20-25%. He had moderate mitral regurgitation. There was mild to moderate pulmonary hypertension with an estimated PA pressure of 38 mmHg        Past Medical History:  Diagnosis Date  . Asthma   . Chronic systolic CHF (congestive heart failure) (HCC)    a. 06/2015 Echo: EF 25-30%, mod-sev MR, PASP . b. 08/2016: echo showing EF of 20-25% with diffuse HK  . Moderate to Severe Mitral Regurgitation    a. 06/2015 Echo: Mod-Sev MR. b. 08/2016: echo showing moderate MR.   Marland Kitchen NICM (nonischemic cardiomyopathy) (HCC)    a. 06/2015 Echo: EF 25-30%, normal cors by cath - ? Tachy-mediated;    .  Non-obstructive CAD    a. 06/2015 Cath: LM nl, LAD 6m, LCX nl, RCA nl, EF 25-30%.  Marland Kitchen PAF (paroxysmal atrial fibrillation) (HCC)          Past Surgical History:  Procedure Laterality Date  . CARDIAC CATHETERIZATION N/A 07/26/2015   Procedure: Left Heart Cath and Coronary Angiography;  Surgeon: Corky Crafts, MD;  Location: [facility] INVASIVE CV LAB;  Service: Cardiovascular;  Laterality: N/A;     Inpatient Medications: Scheduled Meds: . apixaban  5 mg Oral BID   Continuous Infusions: PRN Meds:   Allergies:   No Known Allergies  Social History:   Social History   Social History  . Marital status: Single    Spouse name: N/A  . Number of children: N/A  . Years of education: N/A      Occupational History  . Not on file.         Social History Main Topics  . Smoking status: Former Smoker    Packs/day: 0.50    Years: 40.00    Types: Cigarettes  . Smokeless tobacco: Never Used     Comment: Quit in 2014  . Alcohol use 0.0 oz/week     Comment: 2-3 drinks per week.  . Drug use: Yes     Comment: Not currently. Quit in 2014. 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  . Sexual activity: No       Other Topics Concern  . Not on file  Social History Narrative  . No narrative on file    Family History:   The patient's family history includes Hypertension in his father.  ROS:  Please see the history of present illness.  ROS  All other ROS reviewed and negative.     Physical Exam/Data:         Vitals:   02/04/17 0831 02/04/17 0930 02/04/17 1035 02/04/17 1100  BP:  (!) 115/93 (!) 105/92 (!) 109/95  Pulse:  (!) 141 (!) 110 61  Resp:  17 15 14   Temp:      TempSrc:      SpO2:  97% 96% 97%  Weight: 220 lb (99.8 kg)     Height: 6\' 1"  (1.854 m)      No intake or output data in the 24 hours ending 02/04/17 1306 Filed Weights   02/04/17 0831  Weight: 220 lb (99.8 kg)   Body mass index is 29.03 kg/m.  General:   Well nourished, well developed, in no acute distress HEENT: normal Lymph: no adenopathy Neck:   + JVD  Endocrine:  No thryomegaly Vascular: No carotid bruits; FA pulses 2+ bilaterally without bruits  Cardiac:  Irreg. Irreg.  Tachycardic  Lungs:   Clear anteriorly  Abd: distended.   + abdominal wall edema  Ext:  3+ pitting edema up past his thighs  Musculoskeletal:  No deformities, BUE and BLE strength normal and equal Skin: warm and dry  Neuro:  CNs 2-12 intact, no focal abnormalities noted Psych:  Normal affect   EKG:  The EKG was personally reviewed and demonstrates:   Atrial fib with RVR.  HR 153.   NS St / T wave abn    Telemetry:  Telemetry was personally reviewed and demonstrates:  Afib with HR of  130   Relevant CV Studies:   Laboratory Data:  Chemistry Last Labs    Recent Labs Lab 02/04/17 0905  NA 141  K 4.4  CL 104  CO2 26  GLUCOSE 110*  BUN 24*  CREATININE 1.41*  CALCIUM 8.9  GFRNONAA 52*  GFRAA >60  ANIONGAP 11       Last Labs    Recent Labs Lab 02/04/17 0905  PROT 6.8  ALBUMIN 3.3*  AST 32  ALT 29  ALKPHOS 95  BILITOT 1.8*     Hematology Last Labs    Recent Labs Lab 02/04/17 0905  WBC 11.5*  RBC 4.93  HGB 13.5  HCT 41.1  MCV 83.4  MCH 27.4  MCHC 32.8  RDW 14.4  PLT 218     Cardiac Enzymes Last Labs   No results for input(s): TROPONINI in the last 168 hours.    Recent Labs Lab 02/04/17 1207  TROPIPOC 0.04    BNP Last Labs    Recent Labs Lab 02/04/17 0905  BNP 655.0*      DDimer  Last Labs   No results for input(s): DDIMER in the last 168 hours.    Radiology/Studies:  Dg Abdomen Acute W/chest  Result Date: 02/04/2017 CLINICAL DATA:  Increased abd discomfort, bloating over last1-2 months with recent constipation, cough, prev smoker EXAM: DG ABDOMEN ACUTE W/ 1V CHEST COMPARISON:  09/18/2016 FINDINGS: Mild cardiomegaly. Tortuous thoracic aorta. Blunting of the right lateral costophrenic angle  suggesting small effusion. No free air. Normal bowel gas pattern. Bilateral pelvic phleboliths. Regional bones unremarkable. IMPRESSION: Cardiomegaly with possible small  right pleural effusion. Normal bowel gas pattern.  No free air. Electronically Signed   By: Corlis Leak  M.D.   On: 02/04/2017 09:33    Assessment and Plan:   1.  Acute on chronic systolic congestive heart failure: Patient has a known history of chronic systolic congestive heart failure. He was not taking his Lasix properly. He was doubling up his Lasix at night instead of taking it twice a day. I explained him that this was not an equivalent dosing scheme. In addition, he still eating some additional salt because he works at [employer]   We will start him on Lasix 40 mg IV twice a day. He'll also need additional potassium supplementation.  2. Atrial fibrillation: He has had persistent atrial fibrillation for several months. His rate is rapid likely because of his congestive heart failure. We can use a diltiazem drip short-term to maintain rate control and gradually titrate up his beta blocker. I suspect that his rate will be better controlled after his heart failure has  improved.   Signed, Kristeen Miss, MD  02/04/2017 1:06 PM     Routing History

## 2017-02-08 NOTE — Progress Notes (Signed)
Pt is stable, ambulating well, diuresing well, vitals stable, no any sign of distress and complain of pain, will continue to monitor the patient 

## 2017-02-08 NOTE — Progress Notes (Signed)
Progress Note  Patient Name: Kevin [name] Date of Encounter: 02/08/2017  Primary Cardiologist: Dr. [name]  Subjective   Feeling much better overall day by day but still not 100%. Remains with some LEE. Reports missing his medication about 2x a week. Denies any current cocaine use for the past 4 years.  Inpatient Medications    Scheduled Meds: . apixaban  5 mg Oral BID  . aspirin EC  81 mg Oral Daily  . furosemide  80 mg Intravenous Q12H  . metoprolol tartrate  50 mg Oral Q8H  . pantoprazole  20 mg Oral Daily  . polyethylene glycol  17 g Oral Daily  . potassium chloride  20 mEq Oral Daily  . sodium chloride flush  3 mL Intravenous Q12H   Continuous Infusions: . sodium chloride     PRN Meds: sodium chloride, acetaminophen, albuterol, ondansetron (ZOFRAN) IV, sodium chloride flush   Vital Signs    Vitals:   02/07/17 2020 02/07/17 2123 02/08/17 0530 02/08/17 0610  BP: 96/65 100/60 95/73 104/70  Pulse: (!) 107  73   Resp: 18  18   Temp: 97.8 F (36.6 C)  97.8 F (36.6 C)   TempSrc: Oral  Oral   SpO2: 100%  98%   Weight:   220 lb (99.8 kg)   Height:        Intake/Output Summary (Last 24 hours) at 02/08/17 0921 Last data filed at 02/08/17 0835  Gross per 24 hour  Intake              780 ml  Output             3275 ml  Net            -2495 ml   Filed Weights   02/06/17 0501 02/07/17 0404 02/08/17 0530  Weight: 235 lb 14.4 oz (107 kg) 227 lb 14.4 oz (103.4 kg) 220 lb (99.8 kg)    Telemetry    Atrial fib rates around 100-120 - Personally Reviewed  Physical Exam   GEN: AAM, No acute distress.  HEENT: Normocephalic, atraumatic, sclera non-icteric. Neck: No JVD or bruits. Cardiac: Irregular, tachycardic, no murmurs, rubs, or gallops.  Radials/DP/PT 1+ and equal bilaterally.  Respiratory: Clear to auscultation bilaterally. Breathing is unlabored. GI: Soft, nontender, non-distended, BS +x 4. MS: no deformity. Extremities: No clubbing or cyanosis. 1+ bilateral  LE edema with TED hose in place. Distal pedal pulses are 2+ and equal bilaterally. Neuro:  AAOx3. Follows commands. Psych:  Responds to questions appropriately with a normal affect.  Labs    Chemistry Recent Labs Lab 02/04/17 0905  02/06/17 0447 02/07/17 0330 02/08/17 0408  NA 141  < > 137 138 137  K 4.4  < > 3.8 3.9 3.9  CL 104  < > 101 99* 95*  CO2 26  < > 29 32 34*  GLUCOSE 110*  < > 92 82 86  BUN 24*  < > 29* 25* 23*  CREATININE 1.41*  < > 1.46* 1.39* 1.35*  CALCIUM 8.9  < > 8.8* 8.9 9.0  PROT 6.8  --   --   --   --   ALBUMIN 3.3*  --   --   --   --   AST 32  --   --   --   --   ALT 29  --   --   --   --   ALKPHOS 95  --   --   --   --  BILITOT 1.8*  --   --   --   --   GFRNONAA 52*  < > 50* 53* 55*  GFRAA >60  < > 58* >60 >60  ANIONGAP 11  < > 7 7 8   < > = values in this interval not displayed.   Hematology Recent Labs Lab 02/04/17 0905  WBC 11.5*  RBC 4.93  HGB 13.5  HCT 41.1  MCV 83.4  MCH 27.4  MCHC 32.8  RDW 14.4  PLT 218    Cardiac EnzymesNo results for input(s): TROPONINI in the last 168 hours.  Recent Labs Lab 02/04/17 1207  TROPIPOC 0.04     BNP Recent Labs Lab 02/04/17 0905  BNP 655.0*     DDimer No results for input(s): DDIMER in the last 168 hours.   Radiology    No results found.  Cardiac Studies   2D echo 09/16/16 - Left ventricle: The cavity size was mildly dilated. Wall   thickness was normal. Systolic function was severely reduced. The   estimated ejection fraction was in the range of 20% to 25%.   Diffuse hypokinesis. Indeterminant diastolic function (atrial   fibrillation). - Aortic valve: There was no stenosis. - Mitral valve: There was moderate regurgitation. - Left atrium: The atrium was severely dilated. - Right ventricle: The cavity size was normal. Systolic function   was mildly to moderately reduced. - Right atrium: The atrium was moderately dilated. - Tricuspid valve: Peak RV-RA gradient (S): 23 mm Hg. -  Pulmonary arteries: PA peak pressure: 38 mm Hg (S). - Systemic veins: IVC measured 3.3 cm with < 50% respirophasic   variation, suggesting RA pressure 15 mmHg. Impressions: - The patient was in atrial fibrillation. Mildly dilated LV with EF   20-25%, diffuse hypokinesis. Normal RV size with mild to   moderately decreased systolic function. Moderate mitral   regurgitation, probably functional. Mild pulmonary hypertension.   Dilated IVC suggestive of elevated RV filling pressure.   Patient Profile     61 y.o. 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 Per chart review, initial diagnosis of atrial fib was in 2016 with intermittent nature/occasional PACs, LVEF down, cath with minimal CAD; not placed on anticoag at that time. Admitted 08/2016 with a/c systolic CHF in the setting of atrial fib, treated with rate control with an eye towards considering op DCCV. At visit 09/28/16, digoxin stopped due to HR 52. ACEI/ARB previously held due to AKI. At visit 10/19/16, afib deemed longstanding persistent and rate control was continued. Readmitted 02/04/2017 with worsening SOB in setting of salty meals and skipping Lasix doses due to disruption with work.  Assessment & Plan    1. Acute on chronic systolic CHF - continues to diurese briskly on current regimen. Wt 234 on adm, down to 220 today. Prior OP dry weight 213lb. Would continue current dose of IV Lasix. He was ordered for decreased dose of losartan today (12.5mg ) and new initiation of spironolactone (12.5mg ) but his BP has been running on the softer side since yesterday so I will hold these pending d/w MD - historical etiology of his cardiomyopathy not totally clear but suggested to be tachy-mediated,  raising question of whether he would benefit from titration of rate control. Will review med regimen with Dr. [name] - Coreg was  switched to Lopressor for improved rate effect, would ultimately recommend to consolidate this to Toprol. Importance of medication compliance recommended as well. Dr. Duke Salvia suggested patient needs defibrillator given persistent LV dysfunction. May be worthwhile to repeat echo this admission.  2. Longstanding persistent atrial fibrillation - previously PAF in 2016, but appears to be persistent from 08/2016 on, no prior attempts at DCCV. Given his intermittent med noncompliance I am not sure he would be a good candidate to consider this. Will review consideration of increasing BB with Dr. [name]. Continue Eliquis. Consider d/c of aspirin.  3. 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  4. Prior cocaine use, prior tobacco, habitual alcohol use - denies any recurrent cocaine/tobacco use for several years. Reports he drinks 2-3 drinks per week.  Signed, Laurann Montana, PA-C  02/08/2017, 9:21 AM

## 2017-02-09 DIAGNOSIS — I340 Nonrheumatic mitral (valve) insufficiency: Secondary | ICD-10-CM

## 2017-02-09 LAB — BASIC METABOLIC PANEL
ANION GAP: 8 (ref 5–15)
BUN: 22 mg/dL — ABNORMAL HIGH (ref 6–20)
CALCIUM: 9 mg/dL (ref 8.9–10.3)
CO2: 34 mmol/L — AB (ref 22–32)
CREATININE: 1.37 mg/dL — AB (ref 0.61–1.24)
Chloride: 94 mmol/L — ABNORMAL LOW (ref 101–111)
GFR calc Af Amer: >60 mL/min (ref >60)
GFR, EST NON AFRICAN AMERICAN: 54 mL/min — AB (ref >60)
GLUCOSE: 107 mg/dL — AB (ref 65–99)
Potassium: 3.9 mmol/L (ref 3.5–5.1)
Sodium: 136 mmol/L (ref 135–145)

## 2017-02-09 LAB — CBC
HCT: 43.9 % (ref 39.0–52.0)
HEMOGLOBIN: 14.1 g/dL (ref 13.0–17.0)
MCH: 26.6 pg (ref 26.0–34.0)
MCHC: 32.1 g/dL (ref 30.0–36.0)
MCV: 82.7 fL (ref 78.0–100.0)
PLATELETS: 239 10*3/uL (ref 150–400)
RBC: 5.31 MIL/uL (ref 4.22–5.81)
RDW: 13.9 % (ref 11.5–15.5)
WBC: 6.7 10*3/uL (ref 4.0–10.5)

## 2017-02-09 NOTE — Progress Notes (Addendum)
Grossly normal  DAILY PROGRESS NOTE   Patient Name: Kevin [name] Date of Encounter: 02/09/2017  Hospital Problem List   Principal Problem:   Acute on chronic systolic heart failure [facility]) Active Problems:   Non-ischemic cardiomyopathy (HCC)   Mitral regurgitation   Atrial fibrillation with rapid ventricular response (HCC)   CKD (chronic kidney disease), stage II   History of cocaine use   Former tobacco use    Chief Complaint   Breathing better today  Subjective   Diuresed another 1.8L negative overnight. Weight now down to 213 lbs. Creatinine has been stable.  Objective   Vitals:   02/08/17 2139 02/09/17 0519 02/09/17 0903 02/09/17 0904  BP: 100/81 109/84 (!) 85/66 96/82  Pulse: (!) 107 75    Resp:  18 16   Temp:  97.6 F (36.4 C)    TempSrc:  Oral    SpO2:  98% 98%   Weight:  213 lb 3.2 oz (96.7 kg)    Height:        Intake/Output Summary (Last 24 hours) at 02/09/17 1105 Last data filed at 02/09/17 9678  Gross per 24 hour  Intake              840 ml  Output             2350 ml  Net            -1510 ml   Filed Weights   02/07/17 0404 02/08/17 0530 02/09/17 0519  Weight: 227 lb 14.4 oz (103.4 kg) 220 lb (99.8 kg) 213 lb 3.2 oz (96.7 kg)    Physical Exam   General appearance: alert and no distress Lungs: clear to auscultation bilaterally Heart: irregularly irregular rhythm Abdomen: soft, non-tender; bowel sounds normal; no masses,  no organomegaly Pulses: 2+ and symmetric Neurologic: GroPsych: Pleasanty normal Psych: Pleasant  Inpatient Medications    Scheduled Meds: . apixaban  5 mg Oral BID  . aspirin EC  81 mg Oral Daily  . furosemide  80 mg Intravenous Q12H  . metoprolol succinate  100 mg Oral Daily  . pantoprazole  20 mg Oral Daily  . polyethylene glycol  17 g Oral Daily  . potassium chloride  20 mEq Oral Daily  . sodium chloride flush  3 mL Intravenous Q12H    Continuous Infusions: . sodium chloride      PRN Meds: sodium chloride,  acetaminophen, albuterol, ondansetron (ZOFRAN) IV, sodium chloride flush   Labs   Results for orders placed or performed during the hospital encounter of 02/04/17 (from the past 48 hour(s))  Basic metabolic panel     Status: Abnormal   Collection Time: 02/08/17  4:08 AM  Result Value Ref Range   Sodium 137 135 - 145 mmol/L   Potassium 3.9 3.5 - 5.1 mmol/L   Chloride 95 (L) 101 - 111 mmol/L   CO2 34 (H) 22 - 32 mmol/L   Glucose, Bld 86 65 - 99 mg/dL   BUN 23 (H) 6 - 20 mg/dL   Creatinine, Ser 1.35 (H) 0.61 - 1.24 mg/dL   Calcium 9.0 8.9 - 10.3 mg/dL   GFR calc non Af Amer 55 (L) >60 mL/min   GFR calc Af Amer >60 >60 mL/min    Comment: (NOTE) The eGFR has been calculated using the CKD EPI equation. This calculation has not been validated in all clinical situations. eGFR's persistently <60 mL/min signify possible Chronic Kidney Disease.    Anion gap 8 5 - 15  Urine  rapid drug screen (hosp performed)     Status: None   Collection Time: 02/08/17  2:35 PM  Result Value Ref Range   Opiates NONE DETECTED NONE DETECTED   Cocaine NONE DETECTED NONE DETECTED   Benzodiazepines NONE DETECTED NONE DETECTED   Amphetamines NONE DETECTED NONE DETECTED   Tetrahydrocannabinol NONE DETECTED NONE DETECTED   Barbiturates NONE DETECTED NONE DETECTED    Comment:        DRUG SCREEN FOR MEDICAL PURPOSES ONLY.  IF CONFIRMATION IS NEEDED FOR ANY PURPOSE, NOTIFY LAB WITHIN 5 DAYS.        LOWEST DETECTABLE LIMITS FOR URINE DRUG SCREEN Drug Class       Cutoff (ng/mL) Amphetamine      1000 Barbiturate      200 Benzodiazepine   408 Tricyclics       144 Opiates          300 Cocaine          300 THC              50   Basic metabolic panel     Status: Abnormal   Collection Time: 02/09/17  1:27 AM  Result Value Ref Range   Sodium 136 135 - 145 mmol/L   Potassium 3.9 3.5 - 5.1 mmol/L   Chloride 94 (L) 101 - 111 mmol/L   CO2 34 (H) 22 - 32 mmol/L   Glucose, Bld 107 (H) 65 - 99 mg/dL   BUN 22 (H)  6 - 20 mg/dL   Creatinine, Ser 1.37 (H) 0.61 - 1.24 mg/dL   Calcium 9.0 8.9 - 10.3 mg/dL   GFR calc non Af Amer 54 (L) >60 mL/min   GFR calc Af Amer >60 >60 mL/min    Comment: (NOTE) The eGFR has been calculated using the CKD EPI equation. This calculation has not been validated in all clinical situations. eGFR's persistently <60 mL/min signify possible Chronic Kidney Disease.    Anion gap 8 5 - 15  CBC     Status: None   Collection Time: 02/09/17  1:27 AM  Result Value Ref Range   WBC 6.7 4.0 - 10.5 K/uL   RBC 5.31 4.22 - 5.81 MIL/uL   Hemoglobin 14.1 13.0 - 17.0 g/dL   HCT 43.9 39.0 - 52.0 %   MCV 82.7 78.0 - 100.0 fL   MCH 26.6 26.0 - 34.0 pg   MCHC 32.1 30.0 - 36.0 g/dL   RDW 13.9 11.5 - 15.5 %   Platelets 239 150 - 400 K/uL    ECG   N/A - Personally Reviewed  Telemetry   A-fib around 100 - Personally Reviewed  Radiology    No results found.  Cardiac Studies   N/A  Assessment   1. Principal Problem: 2.   Acute on chronic systolic heart failure ([place]) 3. Active Problems: 4.   Non-ischemic cardiomyopathy ([place]) 5.   Mitral regurgitation 6.   Atrial fibrillation with rapid ventricular response (HCC) 7.   CKD (chronic kidney disease), stage II 8.   History of cocaine use 9.   Former tobacco use 10.   Plan   1. Continued excellent diuresis - creatinine stable. Continue IV diuresis today, can likely switch to po diuretics tomorrow. Home possible tomorrow afternoon.  Time Spent Directly with Patient:  I have spent a total of 15 minutes with the patient reviewing hospital notes, telemetry, EKGs, labs and examining the patient as well as establishing an assessment and plan that was discussed personally  with the patient. > 50% of time was spent in direct patient care.  Length of Stay:  LOS: 5 days   Pixie Casino, MD, [place]  Attending Cardiologist  Direct Dial: [phone]  Fax: [phone]  Website:   [email or web address] 02/09/2017, 11:05 AM

## 2017-02-09 NOTE — Progress Notes (Signed)
No acute events overnight. Patient slept well, VS stable, no complaints of pain. Will continue to monitor.  Tahesha Skeet, RN

## 2017-02-09 NOTE — Progress Notes (Signed)
Pt is alert and oriented with no complaints plan to diurese and walk in halls. Watching b/p with Iv Lasix and metoprolol held.

## 2017-02-10 DIAGNOSIS — Z87891 Personal history of nicotine dependence: Secondary | ICD-10-CM

## 2017-02-10 MED ORDER — METOPROLOL SUCCINATE ER 50 MG PO TB24
50.0000 mg | ORAL_TABLET | Freq: Every evening | ORAL | Status: DC
  Administered 2017-02-10: 50 mg via ORAL
  Filled 2017-02-10: qty 1

## 2017-02-10 MED ORDER — METOPROLOL SUCCINATE ER 100 MG PO TB24
100.0000 mg | ORAL_TABLET | Freq: Every day | ORAL | Status: DC
  Administered 2017-02-11: 100 mg via ORAL
  Filled 2017-02-10: qty 1

## 2017-02-10 MED ORDER — FUROSEMIDE 40 MG PO TABS
40.0000 mg | ORAL_TABLET | Freq: Two times a day (BID) | ORAL | Status: DC
  Administered 2017-02-10 – 2017-02-11 (×2): 40 mg via ORAL
  Filled 2017-02-10 (×3): qty 1

## 2017-02-10 NOTE — Progress Notes (Signed)
Per order, pt was ambulated while checking HR; rhythm continues to be a-fib with RVR with HR sustaining in the 100-110s.  HR did accelerate to 135, but was non-sustaining.  Data can be found in Doc flowsheets.

## 2017-02-10 NOTE — Progress Notes (Signed)
Patient removed accidentally IV. Refusing to have another one. Patient educated about importance of IV, however he is refusing to have another one. MD cardiology Chakravatti made aware. Will continue to monitor.  Veryl Winemiller, RN

## 2017-02-10 NOTE — Progress Notes (Signed)
Patient with no complaints or concerns during 7pm - 7am shift.  Jada Fass, RN 

## 2017-02-10 NOTE — Progress Notes (Signed)
Progress Note  Patient Name: Kevin [name] Date of Encounter: 02/10/2017  Primary Cardiologist: Dr. [name]  Subjective   Breathing improved along with lower extremity edema. Reports he's been walking around in the room occasionally without symptoms. HR remains periodically high, 100-130.  Inpatient Medications    Scheduled Meds: . apixaban  5 mg Oral BID  . aspirin EC  81 mg Oral Daily  . furosemide  80 mg Intravenous Q12H  . metoprolol succinate  100 mg Oral Daily  . pantoprazole  20 mg Oral Daily  . polyethylene glycol  17 g Oral Daily  . potassium chloride  20 mEq Oral Daily  . sodium chloride flush  3 mL Intravenous Q12H   Continuous Infusions: . sodium chloride     PRN Meds: sodium chloride, acetaminophen, albuterol, ondansetron (ZOFRAN) IV, sodium chloride flush   Vital Signs    Vitals:   02/09/17 0904 02/09/17 1210 02/09/17 1946 02/10/17 0545  BP: 96/82 107/68 104/66 101/78  Pulse:  84 63 60  Resp:  20 18 18   Temp:  97.8 F (36.6 C) (!) 97.4 F (36.3 C) 98.2 F (36.8 C)  TempSrc:  Oral Oral Oral  SpO2:  94% 97% 97%  Weight:    209 lb 1.6 oz (94.8 kg)  Height:        Intake/Output Summary (Last 24 hours) at 02/10/17 1208 Last data filed at 02/10/17 0865  Gross per 24 hour  Intake              600 ml  Output             1250 ml  Net             -650 ml   Filed Weights   02/08/17 0530 02/09/17 0519 02/10/17 0545  Weight: 220 lb (99.8 kg) 213 lb 3.2 oz (96.7 kg) 209 lb 1.6 oz (94.8 kg)    Telemetry    Atrial fib, persistent, HR 100 currently (occ bouts to 220) - Personally Reviewed  Physical Exam   GEN: No acute distress.  HEENT: Normocephalic, atraumatic, sclera non-icteric. Neck: No JVD or bruits. Cardiac: Irregularly irregular, borderline elevated HR, no murmurs, rubs, or gallops.  Radials/DP/PT 1+ and equal bilaterally.  Respiratory: Clear to auscultation bilaterally. Breathing is unlabored. GI: Soft, nontender, non-distended, BS +x  4. MS: no deformity. Extremities: No clubbing or cyanosis. Improved LE edema- trace w/ compression stockings. Distal pedal pulses are 2+ and equal bilaterally. Neuro:  AAOx3. Follows commands. Psych:  Responds to questions appropriately with a normal affect.  Labs    Chemistry Recent Labs Lab 02/04/17 0905  02/07/17 0330 02/08/17 0408 02/09/17 0127  NA 141  < > 138 137 136  K 4.4  < > 3.9 3.9 3.9  CL 104  < > 99* 95* 94*  CO2 26  < > 32 34* 34*  GLUCOSE 110*  < > 82 86 107*  BUN 24*  < > 25* 23* 22*  CREATININE 1.41*  < > 1.39* 1.35* 1.37*  CALCIUM 8.9  < > 8.9 9.0 9.0  PROT 6.8  --   --   --   --   ALBUMIN 3.3*  --   --   --   --   AST 32  --   --   --   --   ALT 29  --   --   --   --   ALKPHOS 95  --   --   --   --  BILITOT 1.8*  --   --   --   --   GFRNONAA 52*  < > 53* 55* 54*  GFRAA >60  < > >60 >60 >60  ANIONGAP 11  < > 7 8 8   < > = values in this interval not displayed.   Hematology Recent Labs Lab 02/04/17 0905 02/09/17 0127  WBC 11.5* 6.7  RBC 4.93 5.31  HGB 13.5 14.1  HCT 41.1 43.9  MCV 83.4 82.7  MCH 27.4 26.6  MCHC 32.8 32.1  RDW 14.4 13.9  PLT 218 239    Cardiac EnzymesNo results for input(s): TROPONINI in the last 168 hours.  Recent Labs Lab 02/04/17 1207  TROPIPOC 0.04     BNP Recent Labs Lab 02/04/17 0905  BNP 655.0*     DDimer No results for input(s): DDIMER in the last 168 hours.   Radiology    No results found.  Cardiac Studies   2D echo 09/16/16 - Left ventricle: The cavity size was mildly dilated. Wall thickness was normal. Systolic function was severely reduced. The estimated ejection fraction was in the range of 20% to 25%. Diffuse hypokinesis. Indeterminant diastolic function (atrial fibrillation). - Aortic valve: There was no stenosis. - Mitral valve: There was moderate regurgitation. - Left atrium: The atrium was severely dilated. - Right ventricle: The cavity size was normal. Systolic function was  mildly to moderately reduced. - Right atrium: The atrium was moderately dilated. - Tricuspid valve: Peak RV-RA gradient (S): 23 mm Hg. - Pulmonary arteries: PA peak pressure: 38 mm Hg (S). - Systemic veins: IVC measured 3.3 cm with < 50% respirophasic variation, suggesting RA pressure 15 mmHg. Impressions: - The patient was in atrial fibrillation. Mildly dilated LV with EF 20-25%, diffuse hypokinesis. Normal RV size with mild to moderately decreased systolic function. Moderate mitral regurgitation, probably functional. Mild pulmonary hypertension. Dilated IVC suggestive of elevated RV filling pressure.  Patient Profile     61 y.o. 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 Per chart review, initial diagnosis of atrial fib was in 2016 with intermittent nature/occasional PACs, LVEF down, cath with minimal CAD; not placed on anticoag at that time. Admitted 08/2016 with a/c systolic CHF in the setting of atrial fib, treated with rate control with an eye towards considering op DCCV. At visit 09/28/16, digoxin stopped due to HR 52. ACEI/ARB previously held due to AKI. At visit 10/19/16, afib deemed longstanding persistent and rate control was continued. Readmitted 02/04/2017 with worsening SOB in setting of salty meals and skipping Lasix doses due to disruption with work.  Assessment & Plan    1. Acute on chronic systolic CHF, suspect due to med and dietary noncompliance - weight now below baseline 234lb->209lb, -9.7L. Historical etiology of his cardiomyopathy not totally clear but suggested to be tachy-mediated. Will d/c IV Lasix and change to oral form this afternoon. Long-term may need to consider ICD but compliance issues and insurance  remain barriers to guideline directed care.  2. Longstanding persistent atrial fibrillation - previously PAF in 2016, but appears to be persistent from 08/2016 on, no prior attempts at DCCV. Given his intermittent med noncompliance not really a good candidate to continue this. Dr. [name] feels he is unlikely to achieve sinus at this point with cardioversion. HR remains elevated 100-130  at times, will discuss further increase in BB with MD (could also consider digoxin versus amiodarone for rate control, given softer BP). Diltiazem not ideal given LV dysfunction. Will ask nurse to ambulate patient to assess HR response.  3. 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  4. Prior cocaine use, prior tobacco, habitual alcohol use - denies any recurrent cocaine/tobacco use for several years. Reports he drinks 2-3 drinks per week.  Signed, Laurann Montana, PA-C  02/10/2017, 12:08 PM

## 2017-02-11 MED ORDER — ASPIRIN 81 MG PO TBEC: 81.0000 mg | DELAYED_RELEASE_TABLET | Freq: Every day | ORAL | Status: DC

## 2017-02-11 MED ORDER — POTASSIUM CHLORIDE CRYS ER 20 MEQ PO TBCR: 20.0000 meq | EXTENDED_RELEASE_TABLET | Freq: Every day | ORAL | 2 refills | Status: DC

## 2017-02-11 MED ORDER — APIXABAN 5 MG PO TABS: 5.0000 mg | ORAL_TABLET | Freq: Two times a day (BID) | ORAL | 2 refills | Status: DC

## 2017-02-11 MED ORDER — METOPROLOL SUCCINATE ER 50 MG PO TB24: ORAL_TABLET | ORAL | 2 refills | Status: DC

## 2017-02-11 MED ORDER — FUROSEMIDE 40 MG PO TABS: 40.0000 mg | ORAL_TABLET | Freq: Two times a day (BID) | ORAL | 2 refills | Status: DC

## 2017-02-11 NOTE — Progress Notes (Signed)
Offered bath pt stated he will wait till he gets home to take a shower

## 2017-02-11 NOTE — Discharge Summary (Signed)
Discharge Summary    Patient ID: Kevin [name],  MRN: [id number], DOB/AGE: 1956-02-06 61 y.o.  Admit date: 02/04/2017 Discharge date: 02/11/2017  Primary Care Provider: [name] Primary Cardiologist: Dr. [name]  Discharge Diagnoses    Principal Problem:   Acute on chronic systolic heart failure [facility]) Active Problems:   Non-ischemic cardiomyopathy (HCC)   Mitral regurgitation   Atrial fibrillation with rapid ventricular response (HCC)   CKD (chronic kidney disease), stage II   History of cocaine use   Former tobacco use  Diagnostic Studies/Procedures    N/A _____________     History of Present Illness     Kevin [name] is a 61 y.o. male with history of nonischemic cardiomyopathy/chronic systolic CHF(EF 25-30% in 2016, cath showed mild nonobstructive CAD), longstanding persistent atrial fib (initially diagnosed in 2016), COPD, moderate MR, remote cocaine use, prior tobacco abuse, probable CKD stage II-III who presented back to [facility] with HF.   Per chart review, initial diagnosis of atrial fib was in 2016 with intermittent nature/occasional PACs, LVEF down, cath with minimal CAD; not placed on anticoag at that time. Admitted 08/2016 with a/c systolic CHF in the setting of atrial fib, treated with rate control with an eye towards considering op DCCV. At visit 09/28/16, digoxin stopped due to HR 52. ACEI/ARB previously held due to AKI. At visit 10/19/16 with Dr. [name], afib deemed longstanding persistent and rate control strategy was continued. He was readmitted 02/04/2017 with worsening SOB in setting of salty meals and skipping Lasix doses due to disruption with work. He reported missing his doses on average about 2x a week. At discharge it was noted that several of his medicines hadn't been refilled since Feb/March, with enough refills to have lasted him through May-June. He states he has not gotten his medication anywhere else.  Hospital Course    The following issues were  addressed:  1. Acute on chronic systolic CHF, suspect due to med and dietary noncompliance -treated with IV Lasix therapy, weight now below baseline 234lb->209lb, -9.7L. Historical etiology of his cardiomyopathy not totally clear but suggested to be tachy-mediated. ACEI/ARB held due to soft BP and need to titrate rate controlling medications. He was transitioned over to oral Lasix yesterday. If his BP remains stable and he is compliant with OP f/u and meds, would consider substituting one of his Lasix doses for spironolactone as outpatient. Will also arrange referral to EP to discuss strategies for atrial fib management in the context of his longstanding cardiomyopathy, which may also require ICD placement if compliance is demonstrated. Rec'd education regarding 2g sodium diet, 2L fluid restriction, daily weights.  2. Longstandingpersistent atrial fibrillation - previously PAF in 2016, but appears to be persistent from 08/2016 on, no prior attempts at DCCV. Given his intermittent med noncompliance not really a good candidate to pursue this. Dr. [name] feels he is unlikely to achieve sinus at this point with cardioversion. He feels we have reached the maximum control that can be achieved at present time with his HR - 90-100 at present time on Toprol 100mg  QAM/50mg  QPM. HR control limited by BP response. Diltiazem not ideal given LV dysfunction. As above, refer to EP. Sent message to scheduling to help arrange this. Of note, aspirin 81mg  daily was added this admission - per discussion with Dr. [name], he wished to continue this in addition to Eliquis. He was also advised to stop Bayer Back/Body which contains extra caffeine and aspirin. Bleeding precautions provided.  3. 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  4. Prior cocaine use, prior tobacco, habitual alcohol use- denies any recurrent cocaine/tobacco use for several years. UDS negative this admission. Reports he drinks  2-3 drinks per week.  It was also noted that several of his medications had not been refilled under our EMR since earlier this year so med refills were sent into his requested pharmacy for Lasix, potassium and Eliquis. Dr. [name] has seen and examined the patient today and feels he is stable for discharge. TOC Visit recommended, first availability 02/24/17 OK'd by MD. Kemper Durie will call him for EP appt as well.  _____________  Discharge Vitals Blood pressure 103/69, pulse (!) 102, temperature 97.7 F (36.5 C), temperature source Oral, resp. rate 18, height 6' 1.5" (1.867 m), weight 209 lb 6.4 oz (95 kg), SpO2 99 %.  Filed Weights   02/09/17 0519 02/10/17 0545 02/11/17 0533  Weight: 213 lb 3.2 oz (96.7 kg) 209 lb 1.6 oz (94.8 kg) 209 lb 6.4 oz (95 kg)    Labs & Radiologic Studies    CBC  Recent Labs  02/09/17 0127  WBC 6.7  HGB 14.1  HCT 43.9  MCV 82.7  PLT 239   Basic Metabolic Panel  Recent Labs  02/09/17 0127  NA 136  K 3.9  CL 94*  CO2 34*  GLUCOSE 107*  BUN 22*  CREATININE 1.37*  CALCIUM 9.0   _____________  Dg Abdomen Acute W/chest  Result Date: 02/04/2017 CLINICAL DATA:  Increased abd discomfort, bloating over last1-2 months with recent constipation, cough, prev smoker EXAM: DG ABDOMEN ACUTE W/ 1V CHEST COMPARISON:  09/18/2016 FINDINGS: Mild cardiomegaly. Tortuous thoracic aorta. Blunting of the right lateral costophrenic angle suggesting small effusion. No free air. Normal bowel gas pattern. Bilateral pelvic phleboliths. Regional bones unremarkable. IMPRESSION: Cardiomegaly with possible small  right pleural effusion. Normal bowel gas pattern.  No free air. Electronically Signed   By: Corlis Leak M.D.   On: 02/04/2017 09:33   Disposition   Pt is being discharged home today in good condition.  Follow-up Plans & Appointments    Follow-up Information    [facility] Office Follow up.   Specialty:  Cardiology Why:  Dr. [name] would like you to see  one of our electrical doctors to discuss your atrial fibrillation and weak heart muscle. The office will be calling you to arrange this. Contact information: [address] [place] [address] [phone]       Ellsworth Lennox, PA-C Follow up.   Specialties:  Physician Assistant, Cardiology Why:  [facility] office - 02/24/17 at 8:30am. Must arrive 15 minutes early to get registered to be seen. [place] is one of the PAs that works closely with Dr. [name]. Contact information: [address] [place] [address] [phone]          Discharge Instructions    Diet - low sodium heart healthy    Complete by:  As directed    Increase activity slowly    Complete by:  As directed    Several of your medicines have changed. You will stop carvedilol, losartan, and Bayer Back&Body. The Bayer medicine contains both extra aspirin and caffeine, which are not advised with your heart conditions and current medicines.  Dr. [name] wants you to start a baby aspirin daily (over the counter) and also a medicine called metoprolol. While on a blood thinner, if you notice any bleeding such as blood  in stool, black tarry stools, blood in urine, nosebleeds or any other unusual bleeding, call your doctor immediately.      Discharge Medications   Allergies as of 02/11/2017   No Known Allergies     Medication List    STOP taking these medications   BAYER BACK & BODY 500-32.5 MG Tabs Generic drug:  Aspirin-Caffeine   carvedilol 12.5 MG tablet Commonly known as:  COREG   losartan 25 MG tablet Commonly known as:  COZAAR     TAKE these medications   albuterol 108 (90 Base) MCG/ACT inhaler Commonly known as:  PROVENTIL HFA;VENTOLIN HFA Inhale 2 puffs into the lungs every 6 (six) hours as needed for wheezing or shortness of breath.   apixaban 5 MG Tabs tablet Commonly known as:  ELIQUIS Take 1 tablet (5 mg total) by mouth 2 (two) times  daily.   aspirin 81 MG EC tablet Take 1 tablet (81 mg total) by mouth daily.   furosemide 40 MG tablet Commonly known as:  LASIX Take 1 tablet (40 mg total) by mouth 2 (two) times daily.   lansoprazole 30 MG capsule Commonly known as:  PREVACID Take 30 mg by mouth daily as needed (for heartburn/indigestion).   metoprolol succinate 50 MG 24 hr tablet Commonly known as:  TOPROL-XL Take 2 tablets (100mg ) by mouth in the morning, and 1 tablet (50mg ) in the evening.   potassium chloride SA 20 MEQ tablet Commonly known as:  K-DUR,KLOR-CON Take 1 tablet (20 mEq total) by mouth daily.        Allergies:  No Known Allergies   Outstanding Labs/Studies   n/a  Duration of Discharge Encounter   Greater than 30 minutes including physician time.  Signed, Laurann Montana PA-C 02/11/2017, 12:18 PM

## 2017-02-11 NOTE — Progress Notes (Signed)
Progress Note  Patient Name: Kevin [name] Date of Encounter: 02/11/2017  Primary Cardiologist: Dr. [name]  Subjective   Has been ambulating without difficulty. Edema resolved. No CP.  Inpatient Medications    Scheduled Meds: . apixaban  5 mg Oral BID  . aspirin EC  81 mg Oral Daily  . furosemide  40 mg Oral BID  . metoprolol succinate  50 mg Oral QPM   And  . metoprolol succinate  100 mg Oral Daily  . pantoprazole  20 mg Oral Daily  . polyethylene glycol  17 g Oral Daily  . potassium chloride  20 mEq Oral Daily  . sodium chloride flush  3 mL Intravenous Q12H   Continuous Infusions: . sodium chloride     PRN Meds: sodium chloride, acetaminophen, albuterol, ondansetron (ZOFRAN) IV, sodium chloride flush   Vital Signs    Vitals:   02/09/17 1946 02/10/17 0545 02/10/17 2001 02/11/17 0533  BP: 104/66 101/78 (!) 110/50 103/69  Pulse: 63 60 78 (!) 102  Resp: 18 18 18 18   Temp: (!) 97.4 F (36.3 C) 98.2 F (36.8 C) 98 F (36.7 C) 97.7 F (36.5 C)  TempSrc: Oral Oral Oral Oral  SpO2: 97% 97% 99% 99%  Weight:  209 lb 1.6 oz (94.8 kg)  209 lb 6.4 oz (95 kg)  Height:        Intake/Output Summary (Last 24 hours) at 02/11/17 1113 Last data filed at 02/11/17 0920  Gross per 24 hour  Intake              480 ml  Output              820 ml  Net             -340 ml   Filed Weights   02/09/17 0519 02/10/17 0545 02/11/17 0533  Weight: 213 lb 3.2 oz (96.7 kg) 209 lb 1.6 oz (94.8 kg) 209 lb 6.4 oz (95 kg)    Telemetry    Atrial fib rates 88-100, occ increases to 110-120s - Personally Reviewed   Physical Exam   GEN: No acute distress.  HEENT: Normocephalic, atraumatic, sclera non-icteric. Neck:No JVD or bruits. Cardiac:Irregularly irregular, borderline elevated HR, no murmurs, rubs, or gallops.  Radials/DP/PT 1+ and equal bilaterally.  Respiratory:Clear to auscultation bilaterally. Breathing is unlabored. BJ:YNWG, nontender, non-distended, BS +x 4. MS:no  deformity. Extremities: No clubbing or cyanosis.No sig LEE.Marland Kitchen Distal pedal pulses are 2+ and equal bilaterally. Neuro:AAOx3. Follows commands. Psych:  Responds to questions appropriately with a normal affect.  Labs    Chemistry Recent Labs Lab 02/07/17 0330 02/08/17 0408 02/09/17 0127  NA 138 137 136  K 3.9 3.9 3.9  CL 99* 95* 94*  CO2 32 34* 34*  GLUCOSE 82 86 107*  BUN 25* 23* 22*  CREATININE 1.39* 1.35* 1.37*  CALCIUM 8.9 9.0 9.0  GFRNONAA 53* 55* 54*  GFRAA >60 >60 >60  ANIONGAP 7 8 8      Hematology Recent Labs Lab 02/09/17 0127  WBC 6.7  RBC 5.31  HGB 14.1  HCT 43.9  MCV 82.7  MCH 26.6  MCHC 32.1  RDW 13.9  PLT 239     Recent Labs Lab 02/04/17 1207  TROPIPOC 0.04       Radiology    No results found.  Cardiac Studies   2D echo 09/16/16 - Left ventricle: The cavity size was mildly dilated. Wall thickness was normal. Systolic function was severely reduced. The estimated ejection fraction was in  the range of 20% to 25%. Diffuse hypokinesis. Indeterminant diastolic function (atrial fibrillation). - Aortic valve: There was no stenosis. - Mitral valve: There was moderate regurgitation. - Left atrium: The atrium was severely dilated. - Right ventricle: The cavity size was normal. Systolic function was mildly to moderately reduced. - Right atrium: The atrium was moderately dilated. - Tricuspid valve: Peak RV-RA gradient (S): 23 mm Hg. - Pulmonary arteries: PA peak pressure: 38 mm Hg (S). - Systemic veins: IVC measured 3.3 cm with < 50% respirophasic variation, suggesting RA pressure 15 mmHg. Impressions: - The patient was in atrial fibrillation. Mildly dilated LV with EF 20-25%, diffuse hypokinesis. Normal RV size with mild to moderately decreased systolic function. Moderate mitral regurgitation, probably functional. Mild pulmonary hypertension. Dilated IVC suggestive of elevated RV filling pressure.  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 Per chart review, initial diagnosis of atrial fib was in 2016 with intermittent nature/occasional PACs, LVEF down, cath with minimal CAD; not placed on anticoag at that time. Admitted 08/2016 with a/c systolic CHF in the setting of atrial fib, treated with rate control with an eye towards considering op DCCV. At visit 09/28/16, digoxin stopped due to HR 52. ACEI/ARB previously held due to AKI. At visit 10/19/16, afib deemed longstanding persistent and rate control was continued. Readmitted 02/04/2017 with worsening SOB in setting of salty meals and skipping Lasix doses due to disruption with work.  Assessment & Plan    1. Acute on chronic systolic CHF, suspect due to med and dietary noncompliance -weight now below baseline 234lb->209lb, -9.7L. Historical etiology of his cardiomyopathy not totally clear but suggested to be tachy-mediated. Long-term may need to consider ICD but compliance issues and insurance remain barriers to guideline directed care. Continue current Lasix dose; will clarify diuretic regimen with MD whether spironolactone would be a suitable substitution for one of his Lasix doses given low EF. Consider readdition of ACEI/ARB readdition as OP if BP will tolerate.  2. Longstandingpersistent atrial fibrillation - previously PAF in 2016, but appears to be persistent from 08/2016 on, no prior attempts at DCCV. Given his intermittent med noncompliance not really a good candidate to continue this. Dr. [name] feels he is unlikely to achieve sinus at this point with cardioversion. HR remains elevated 100-130 at times, will discuss further increase in BB with MD (could also consider digoxin versus  amiodarone for rate control, given softer BP). Diltiazem not ideal given LV dysfunction. HR mildly improved on Toprol 100mg  QAM/50mg  QPM. If able to titrate as OP could potentially go to 100mg  BID. Could consider referral to EP as OP for their input.  3. 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  4. Prior cocaine use, prior tobacco, habitual alcohol use- denies any recurrent cocaine/tobacco use for several years. Reports he drinks 2-3 drinks per week.  Signed, Laurann Montana, PA-C  02/11/2017, 11:13 AM

## 2017-02-11 NOTE — Progress Notes (Signed)
Orders received for pt discharge.  Discharge summary printed and reviewed with pt.  Explained medication regimen, and pt had no further questions at this time.  IV removed and site remains clean, dry, intact.  Telemetry removed.  Pt in stable condition and awaiting transport. 

## 2017-02-12 ENCOUNTER — Telehealth: Payer: Self-pay

## 2017-02-12 NOTE — Telephone Encounter (Signed)
Call placed to Pt for TCM f/u.  Call went to VM.  No DPR on file.  Left message requesting call back to this nurse.  Name and # left.

## 2017-02-15 NOTE — Telephone Encounter (Signed)
Patient contacted regarding discharge from [facility] on 02/11/2017.  Patient understands to follow up with provider Camnitz on 02/23/2017 at 1400 at [facility] office.  Pt also has f/u with B. Strader 02/24/2017 @ 0830 at [place] office.  Referred Pt to his discharge paperwork for directions to the offices.  Pt asks what he should do If he needed to reschedule.  Gave Pt office number to call if he has to reschedule.. Patient understands discharge instructions? yes Patient understands medications and regiment? Yes- Pt states he has been taking his Eliquis.  States he still needs to fill his prescription for metoprolol-states he will do that today. Patient understands to bring all medications to this visit? Yes-notified Pt to bring his discharge paperwork to f/u visit.   Pt states he is doing well.  States his breathing is good.  Pt has not filled his metoprolol yet, states he will do that today.  Pt states he has been taking his Eliquis.  Asked Pt if he understood his Afib teaching from hospital and importance of Eliquis.  Pt states he understands and has been compliant with taking Eliquis.  Instructed Pt to call if he has any further questions.  Pt indicates understanding.

## 2017-02-22 NOTE — Progress Notes (Signed)
Cardiology Office Note    Date:  02/24/2017   ID:  Kevin [name], DOB 1956-05-26, MRN [id number]  PCP:  [name], FNP  Cardiologist: Dr. [name]   Chief Complaint  Patient presents with  . Hospitalization Follow-up    History of Present Illness:    Kevin [name] is a 61 y.o. male with past medical history of chronic systolic CHF (EF 40-98% by echo in 06/2015), nonischemic cardiomyopathy (cath in 2016 showed nonobstructive CAD), persistent atrial fibrillation (on Eliquis), HTN, HLD, and substance use who presents to the office today for hospital follow-up.   He was recently admitted to [facility] from 7/12 - 02/11/2017 for acute on chronic systolic CHF in the setting of medication noncompliance. BNP was elevated to 655 and he was started on IV Lasix. He diuresed 9.7L with weight declining from 234 lbs to 209 lbs. He was not started on an ACE-I/ARB secondary to borderline hypotension. At the time of discharge, he was continued on Lasix 40mg  BID and Toprol-XL 100mg  in AM and 50mg  in PM. A referral was sent to EP for assistance in the management of his atrial fibrillation and consideration of ICD placement if he can demonstrate compliance in the outpatient setting.   In talking with the patient today, he reports overall doing well from a cardiac perspective since his recent hospitalization. Weights have remained stable at 209 lbs on his home scales, granted they are up to 217 lbs on office scales (fully dressed including large boots). He denies any orthopnea or PND. Has dyspnea on exertion which is at baseline. Notes sinus congestion which is worse after being outside.   Has experienced constipation since hospital discharge but has started using Miralax within the past week with improvement in his symptoms. Last BM was yesterday. No recent melena or hematochezia.   He has been unable to check BP at home but it is low at 94/74 during today's visit (has not yet taken his morning medications).  Notices nausea and fatigue after taking his AM medications. Symptoms are improved within the evening hours.   He denies any recurrent substance use.   Past Medical History:  Diagnosis Date  . Asthma   . Chronic systolic CHF (congestive heart failure) (HCC)    a. 06/2015 Echo: EF 25-30%, mod-sev MR, PASP . b. 08/2016: echo showing EF of 20-25% with diffuse HK  . Cocaine use   . Essential hypertension   . Longstanding persistent atrial fibrillation (HCC)    a. Initially dx 06/2015, paroxysmal at that time. b. Recurrent in Feb 2018 and beyond.  . Moderate mitral regurgitation    a. 06/2015 Echo: Mod-Sev MR. b. 08/2016: echo showing moderate MR.   Marland Kitchen NICM (nonischemic cardiomyopathy) (HCC)    a. 06/2015 Echo: EF 25-30%, normal cors by cath - ? Tachy-mediated;    . Non-obstructive CAD    a. 06/2015 Cath: LM nl, LAD 7m, LCX nl, RCA nl, EF 25-30%.  . Tobacco use     Past Surgical History:  Procedure Laterality Date  . CARDIAC CATHETERIZATION N/A 07/26/2015   Procedure: Left Heart Cath and Coronary Angiography;  Surgeon: Corky Crafts, MD;  Location: [facility] INVASIVE CV LAB;  Service: Cardiovascular;  Laterality: N/A;    Current Medications: Outpatient Medications Prior to Visit  Medication Sig Dispense Refill  . albuterol (PROVENTIL HFA;VENTOLIN HFA) 108 (90 Base) MCG/ACT inhaler Inhale 2 puffs into the lungs every 6 (six) hours as needed for wheezing or shortness of breath. 1 Inhaler 3  .  apixaban (ELIQUIS) 5 MG TABS tablet Take 1 tablet (5 mg total) by mouth 2 (two) times daily. 60 tablet 2  . furosemide (LASIX) 40 MG tablet Take 1 tablet (40 mg total) by mouth 2 (two) times daily. 60 tablet 2  . lansoprazole (PREVACID) 30 MG capsule Take 30 mg by mouth daily as needed (for heartburn/indigestion).    . potassium chloride SA (K-DUR,KLOR-CON) 20 MEQ tablet Take 1 tablet (20 mEq total) by mouth daily. 30 tablet 2  . aspirin EC 81 MG EC tablet Take 1 tablet (81 mg total) by mouth  daily.    . metoprolol succinate (TOPROL-XL) 50 MG 24 hr tablet Take 2 tablets (100mg ) by mouth in the morning, and 1 tablet (50mg ) in the evening. 90 tablet 2   No facility-administered medications prior to visit.      Allergies:   Patient has no known allergies.   Social History   Social History  . Marital status: Single    Spouse name: N/A  . Number of children: N/A  . Years of education: N/A   Social History Main Topics  . Smoking status: Former Smoker    Packs/day: 0.50    Years: 40.00    Types: Cigarettes  . Smokeless tobacco: Never Used     Comment: Quit in 2014  . Alcohol use 0.0 oz/week     Comment: 2-3 drinks per week.  . Drug use: Yes     Comment: Not currently. Quit in 2014. 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  . Sexual activity: No   Other Topics Concern  . None   Social History Narrative  . None     Family History:  The patient's family history includes Hypertension in his father.   Review of Systems:   Please see the history of present illness.     General:  No chills, fever, night sweats or weight changes.  Cardiovascular:  No chest pain, edema, orthopnea, palpitations, paroxysmal nocturnal dyspnea. Positive for dyspnea on exertion.  Dermatological: No rash, lesions/masses Respiratory: No cough, dyspnea Urologic: No hematuria, dysuria Abdominal:   No nausea, vomiting, diarrhea, bright red blood per rectum, melena, or hematemesis. Positive for constipation.  Neurologic:  No visual changes, wkns, changes in mental status.  All other systems reviewed and are otherwise negative except as noted above.   Physical Exam:    VS:  BP 94/74   Pulse 64   Ht 6' 1.5" (1.867 m)   Wt 217 lb 6.4 oz (98.6 kg)   BMI 28.29 kg/m    General: Well developed, well nourished [place] male appearing in no acute distress. Head: Normocephalic, atraumatic, sclera non-icteric, no xanthomas, nares are without discharge.  Neck: No carotid bruits. JVD not elevated.    Lungs: Respirations regular and unlabored, without wheezes or rales.  Heart: Irregularly irregular. No S3 or S4.  No murmur, no rubs, or gallops appreciated. Abdomen: Soft, non-tender, non-distended with normoactive bowel sounds. No hepatomegaly. No rebound/guarding. No obvious abdominal masses. Msk:  Strength and tone appear normal for age. No joint deformities or effusions. Extremities: No clubbing or cyanosis. Trace lower extremity edema.  Distal pedal pulses are 2+ bilaterally. Neuro: Alert and oriented X 3. Moves all extremities spontaneously. No focal deficits noted. Psych:  Responds to questions appropriately with a normal affect. Skin: No rashes or lesions noted  Wt Readings from Last 3 Encounters:  02/24/17 217 lb 6.4 oz (98.6 kg)  02/11/17 209 lb 6.4 oz (95 kg)  10/19/16 213 lb (96.6  kg)     Studies/Labs Reviewed:   EKG:  EKG is not ordered today.   Recent Labs: 09/15/2016: TSH 2.335; TSH 2.361 02/04/2017: ALT 29; B Natriuretic Peptide 655.0 02/09/2017: BUN 22; Creatinine, Ser 1.37; Hemoglobin 14.1; Platelets 239; Potassium 3.9; Sodium 136   Lipid Panel    Component Value Date/Time   CHOL 120 07/26/2015 0340   TRIG 103 07/26/2015 0340   HDL 27 (L) 07/26/2015 0340   CHOLHDL 4.4 07/26/2015 0340   VLDL 21 07/26/2015 0340   LDLCALC 72 07/26/2015 0340    Additional studies/ records that were reviewed today include:   Echocardiogram: 08/2016 Study Conclusions  - Left ventricle: The cavity size was mildly dilated. Wall   thickness was normal. Systolic function was severely reduced. The   estimated ejection fraction was in the range of 20% to 25%.   Diffuse hypokinesis. Indeterminant diastolic function (atrial   fibrillation). - Aortic valve: There was no stenosis. - Mitral valve: There was moderate regurgitation. - Left atrium: The atrium was severely dilated. - Right ventricle: The cavity size was normal. Systolic function   was mildly to moderately reduced. -  Right atrium: The atrium was moderately dilated. - Tricuspid valve: Peak RV-RA gradient (S): 23 mm Hg. - Pulmonary arteries: PA peak pressure: 38 mm Hg (S). - Systemic veins: IVC measured 3.3 cm with < 50% respirophasic   variation, suggesting RA pressure 15 mmHg.  Impressions:  - The patient was in atrial fibrillation. Mildly dilated LV with EF   20-25%, diffuse hypokinesis. Normal RV size with mild to   moderately decreased systolic function. Moderate mitral   regurgitation, probably functional. Mild pulmonary hypertension.   Dilated IVC suggestive of elevated RV filling pressure.   Assessment:    1. Chronic systolic CHF (congestive heart failure) (HCC)   2. Non-ischemic cardiomyopathy (HCC)   3. Medication management   4. Longstanding persistent atrial fibrillation (HCC)   5. Current use of long term anticoagulation   6. Hypotension, unspecified hypotension type   7. CKD (chronic kidney disease) stage 3, GFR 30-59 ml/min      Plan:   In order of problems listed above:  1. Chronic Systolic CHF - EF 20-25% by echo in 06/2015 with cath at that time showing nonobstructive CAD. Repeat echo in 08/2016 showed similar images with an EF of 20-25% and diffuse HK. - recently admitted in 01/2017 for a CHF exacerbation, having diuresed over 9.7L with weight of 209 lbs at the time of discharge. Weight has remained stable at 209 lbs on his home scales per the patient's report. He does have trace edema on examination but JVD is not elevated and lungs are clear.  - will continue with Lasix 40mg  BID and appropriate K+ supplementation. Recheck BMET today.  - continue Toprol-XL with dose reduction to 50mg  BID in the setting of hypotension. Not a candidate for ACE-I/ARB/ARNI secondary to hypotension. Sodium and fluid restriction reviewed with the patient.   2. Nonischemic Cardiomyopathy - significantly reduced EF in the setting of nonobstructive CAD. Previously not considered for ICD placement  with his history of substance use and medication noncompliance.  - he denies any recent substance use and states he is determined to take better care of his health.  - he has follow-up with EP later this month.   3. Persistent Atrial Fibrillation/ Use of Long-term Anticoagulation - HR is well-controlled in the 60's during today's visit.  - will reduce Toprol-XL from 100mg  AM/50mg  PM to 50mg  BID in the  setting of his hypotension and fatigue. - This patients CHA2DS2-VASc Score and unadjusted Ischemic Stroke Rate (% per year) is equal to 2.2 % stroke rate/year from a score of 2 (CHF, Vascular). He denies any evidence of active bleeding. Continue Eliquis 5mg  BID for anticoagulation.  4. Hypotension - BP at 94/74 during today's visit (has not yet taken his morning medications). He does not check BP regularly at home due to not having access to a cuff.  - notes daily nausea and fatigue after taking his morning medications. - will reduce Toprol-XL to 50mg  BID. Will remain off ARB at this time.   5. Stage 3 CKD - baseline creatinine 1.2 - 1.4.  - stable at 1.37 on 02/09/2017. Will recheck BMET today.   Medication Adjustments/Labs and Tests Ordered: Current medicines are reviewed at length with the patient today.  Concerns regarding medicines are outlined above.  Medication changes, Labs and Tests ordered today are listed in the Patient Instructions below. Patient Instructions  Decrease Metoprolol to 50 mg twice a day  Lab today ( bmet )  Follow-up with Dr.Hilty on Thursday 05/27/17 at 9:00 am    Signed, Ellsworth Lennox, PA-C  02/24/2017 9:47 AM    [facility] [address] [place], [address] [place], [place]  [address] Phone: [phone]; Fax: [phone]  [address], [address] [place], [place] [address] Phone: [phone]

## 2017-02-23 ENCOUNTER — Institutional Professional Consult (permissible substitution): Payer: Self-pay | Admitting: Cardiology

## 2017-02-23 NOTE — Progress Notes (Deleted)
Electrophysiology Office Note   Date:  02/23/2017   ID:  Kevin [name], DOB [birth year], MRN [id number]  PCP:  Kevin Neighbors, FNP  Cardiologist:  Kevin [name] Primary Electrophysiologist:  Kevin Matsuo [name], MD    No chief complaint on file.    History of Present Illness: Kevin [name] is a 61 y.o. male who is being seen today for the evaluation of CHF, AF at the request of Kevin Neighbors, FNP. Presenting today for electrophysiology evaluation. He has a history of nonischemic cardiomyopathy with an EF of 20-25% back in 2016, long-standing persistent atrial fibrillation diagnosed in 2016, COPD, moderate MR, remote cocaine abuse, prior tobacco abuse, CK D. He was hospitalized in July due to a heart failure exacerbation. While in the hospital he lost 9.7 L down to a dry weight of 209 pounds. It appears that he has been potentially atrial fibrillation since 2016. He has not had any attempts at cardioversion    Today, he denies*** symptoms of palpitations, chest pain, shortness of breath, orthopnea, PND, lower extremity edema, claudication, dizziness, presyncope, syncope, bleeding, or neurologic sequela. The patient is tolerating medications without difficulties.    Past Medical History:  Diagnosis Date  . Asthma   . Chronic systolic CHF (congestive heart failure) (HCC)    a. 06/2015 Echo: EF 25-30%, mod-sev MR, PASP . b. 08/2016: echo showing EF of 20-25% with diffuse HK  . Cocaine use   . Essential hypertension   . Longstanding persistent atrial fibrillation (HCC)    a. Initially dx 06/2015, paroxysmal at that time. b. Recurrent in Feb 2018 and beyond.  . Moderate mitral regurgitation    a. 06/2015 Echo: Mod-Sev MR. b. 08/2016: echo showing moderate MR.   Marland Kitchen NICM (nonischemic cardiomyopathy) (HCC)    a. 06/2015 Echo: EF 25-30%, normal cors by cath - ? Tachy-mediated;    . Non-obstructive CAD    a. 06/2015 Cath: LM nl, LAD 61m, LCX nl, RCA nl, EF 25-30%.  . Tobacco use     Past Surgical History:  Procedure Laterality Date  . CARDIAC CATHETERIZATION N/A 07/26/2015   Procedure: Left Heart Cath and Coronary Angiography;  Surgeon: Kevin Crafts, MD;  Location: [facility] INVASIVE CV LAB;  Service: Cardiovascular;  Laterality: N/A;     Current Outpatient Prescriptions  Medication Sig Dispense Refill  . albuterol (PROVENTIL HFA;VENTOLIN HFA) 108 (90 Base) MCG/ACT inhaler Inhale 2 puffs into the lungs every 6 (six) hours as needed for wheezing or shortness of breath. 1 Inhaler 3  . apixaban (ELIQUIS) 5 MG TABS tablet Take 1 tablet (5 mg total) by mouth 2 (two) times daily. 60 tablet 2  . aspirin EC 81 MG EC tablet Take 1 tablet (81 mg total) by mouth daily.    . furosemide (LASIX) 40 MG tablet Take 1 tablet (40 mg total) by mouth 2 (two) times daily. 60 tablet 2  . lansoprazole (PREVACID) 30 MG capsule Take 30 mg by mouth daily as needed (for heartburn/indigestion).    . metoprolol succinate (TOPROL-XL) 50 MG 24 hr tablet Take 2 tablets (100mg ) by mouth in the morning, and 1 tablet (50mg ) in the evening. 90 tablet 2  . potassium chloride SA (K-DUR,KLOR-CON) 20 MEQ tablet Take 1 tablet (20 mEq total) by mouth daily. 30 tablet 2   No current facility-administered medications for this visit.     Allergies:   Patient has no known allergies.   Social History:  The patient  reports that he has quit smoking. His smoking  use included Cigarettes. He has a 20.00 pack-year smoking history. He has never used smokeless tobacco. He reports that he drinks alcohol. He reports that he uses drugs.   Family History:  The patient's family history includes Hypertension in his father.    ROS:  Please see the history of present illness.   Otherwise, review of systems is positive for ***.   All other systems are reviewed and negative.    PHYSICAL EXAM: VS:  There were no vitals taken for this visit. , BMI There is no height or weight on file to calculate BMI. GEN: Well nourished,  well developed, in no acute distress  HEENT: normal  Neck: no JVD, carotid bruits, or masses Cardiac: ***RRR; no murmurs, rubs, or gallops,no edema  Respiratory:  clear to auscultation bilaterally, normal work of breathing GI: soft, nontender, nondistended, + BS MS: no deformity or atrophy  Skin: warm and dry Neuro:  Strength and sensation are intact Psych: euthymic mood, full affect  EKG:  EKG {ACTION; IS/IS HEN:27782423} ordered today. Personal review of the ekg ordered shows ***  Recent Labs: 09/15/2016: TSH 2.335; TSH 2.361 02/04/2017: ALT 29; B Natriuretic Peptide 655.0 02/09/2017: BUN 22; Creatinine, Ser 1.37; Hemoglobin 14.1; Platelets 239; Potassium 3.9; Sodium 136    Lipid Panel     Component Value Date/Time   CHOL 120 07/26/2015 0340   TRIG 103 07/26/2015 0340   HDL 27 (L) 07/26/2015 0340   CHOLHDL 4.4 07/26/2015 0340   VLDL 21 07/26/2015 0340   LDLCALC 72 07/26/2015 0340     Wt Readings from Last 3 Encounters:  02/11/17 209 lb 6.4 oz (95 kg)  10/19/16 213 lb (96.6 kg)  10/12/16 211 lb (95.7 kg)      Other studies Reviewed: Additional studies/ records that were reviewed today include: TTE 09/16/16  Review of the above records today demonstrates:  - Left ventricle: The cavity size was mildly dilated. Wall   thickness was normal. Systolic function was severely reduced. The   estimated ejection fraction was in the range of 20% to 25%.   Diffuse hypokinesis. Indeterminant diastolic function (atrial   fibrillation). - Aortic valve: There was no stenosis. - Mitral valve: There was moderate regurgitation. - Left atrium: The atrium was severely dilated. - Right ventricle: The cavity size was normal. Systolic function   was mildly to moderately reduced. - Right atrium: The atrium was moderately dilated. - Tricuspid valve: Peak RV-RA gradient (S): 23 mm Hg. - Pulmonary arteries: PA peak pressure: 38 mm Hg (S). - Systemic veins: IVC measured 3.3 cm with < 50%  respirophasic   variation, suggesting RA pressure 15 mmHg.  Cath 07/25/17  Mild nonobstructive CAD, most notably in the LAD.  There is severe left ventricular systolic dysfunction.  ASSESSMENT AND PLAN:  1.  Chronic systolic heart failure due to nonischemic cardiomyopathy:***  2. Long-standing persistent atrial fibrillation:***  This patients CHA2DS2-VASc Score and unadjusted Ischemic Stroke Rate (% per year) is equal to 2.2 % stroke rate/year from a score of 2  Above score calculated as 1 point each if present [CHF, HTN, DM, Vascular=MI/PAD/Aortic Plaque, Age if 65-74, or Male] Above score calculated as 2 points each if present [Age > 75, or Stroke/TIA/TE]   3. Hypertension: ***   Current medicines are reviewed at length with the patient today.   The patient {ACTIONS; HAS/DOES NOT HAVE:19233} concerns regarding his medicines.  The following changes were made today:  {NONE DEFAULTED:18576::"none"}  Labs/ tests ordered today include: *** No  orders of the defined types were placed in this encounter.    Disposition:   FU with Andreya Lacks {gen number [id number]} {Days to years:10300}  Signed, Yolanda Dockendorf [name], MD  02/23/2017 1:15 PM     [facility] [address] [place] [address] [phone] (office) [phone] (fax)

## 2017-02-24 ENCOUNTER — Encounter: Payer: Self-pay | Admitting: Student

## 2017-02-24 ENCOUNTER — Other Ambulatory Visit: Payer: Self-pay | Admitting: Student

## 2017-02-24 ENCOUNTER — Ambulatory Visit (INDEPENDENT_AMBULATORY_CARE_PROVIDER_SITE_OTHER): Payer: Self-pay | Admitting: Student

## 2017-02-24 VITALS — BP 94/74 | HR 64 | Ht 73.5 in | Wt 217.4 lb

## 2017-02-24 DIAGNOSIS — I5022 Chronic systolic (congestive) heart failure: Secondary | ICD-10-CM

## 2017-02-24 DIAGNOSIS — I959 Hypotension, unspecified: Secondary | ICD-10-CM

## 2017-02-24 DIAGNOSIS — I481 Persistent atrial fibrillation: Secondary | ICD-10-CM

## 2017-02-24 DIAGNOSIS — I428 Other cardiomyopathies: Secondary | ICD-10-CM

## 2017-02-24 DIAGNOSIS — Z7901 Long term (current) use of anticoagulants: Secondary | ICD-10-CM | POA: Insufficient documentation

## 2017-02-24 DIAGNOSIS — Z79899 Other long term (current) drug therapy: Secondary | ICD-10-CM

## 2017-02-24 DIAGNOSIS — N183 Chronic kidney disease, stage 3 unspecified: Secondary | ICD-10-CM

## 2017-02-24 DIAGNOSIS — I4811 Longstanding persistent atrial fibrillation: Secondary | ICD-10-CM

## 2017-02-24 LAB — BASIC METABOLIC PANEL
BUN / CREAT RATIO: 14 (ref 10–24)
BUN: 19 mg/dL (ref 8–27)
CHLORIDE: 100 mmol/L (ref 96–106)
CO2: 24 mmol/L (ref 20–29)
Calcium: 9.5 mg/dL (ref 8.6–10.2)
Creatinine, Ser: 1.39 mg/dL — ABNORMAL HIGH (ref 0.76–1.27)
GFR calc non Af Amer: 54 mL/min/{1.73_m2} — ABNORMAL LOW (ref >59)
GFR, EST AFRICAN AMERICAN: 62 mL/min/{1.73_m2} (ref >59)
Glucose: 84 mg/dL (ref 65–99)
Potassium: 4.5 mmol/L (ref 3.5–5.2)
Sodium: 139 mmol/L (ref 134–144)

## 2017-02-24 MED ORDER — LORATADINE 10 MG PO TABS: 10.0000 mg | ORAL_TABLET | Freq: Every day | ORAL | 5 refills | Status: DC

## 2017-02-24 NOTE — Patient Instructions (Addendum)
Decrease Metoprolol to 50 mg twice a day   Lab today ( bmet )   Dr.Hilty Thursday 05/27/17 at 9:00 am

## 2017-03-04 ENCOUNTER — Telehealth: Payer: Self-pay | Admitting: Internal Medicine

## 2017-03-04 NOTE — Telephone Encounter (Signed)
Called pt and left message for pt to call back to get his blood work results.

## 2017-03-16 ENCOUNTER — Encounter (INDEPENDENT_AMBULATORY_CARE_PROVIDER_SITE_OTHER): Payer: Self-pay

## 2017-03-16 ENCOUNTER — Encounter: Payer: Self-pay | Admitting: Cardiology

## 2017-03-16 ENCOUNTER — Ambulatory Visit (INDEPENDENT_AMBULATORY_CARE_PROVIDER_SITE_OTHER): Payer: Self-pay | Admitting: Cardiology

## 2017-03-16 VITALS — BP 122/82 | HR 58 | Ht 73.0 in | Wt 221.0 lb

## 2017-03-16 DIAGNOSIS — I428 Other cardiomyopathies: Secondary | ICD-10-CM

## 2017-03-16 DIAGNOSIS — I481 Persistent atrial fibrillation: Secondary | ICD-10-CM

## 2017-03-16 DIAGNOSIS — I4819 Other persistent atrial fibrillation: Secondary | ICD-10-CM

## 2017-03-16 MED ORDER — ISOSORB DINITRATE-HYDRALAZINE 20-37.5 MG PO TABS: 1.0000 | ORAL_TABLET | Freq: Three times a day (TID) | ORAL | 3 refills | Status: DC

## 2017-03-16 MED ORDER — METOPROLOL SUCCINATE ER 100 MG PO TB24: 100.0000 mg | ORAL_TABLET | Freq: Every day | ORAL | 3 refills | Status: DC

## 2017-03-16 NOTE — Patient Instructions (Addendum)
Medication Instructions:  Your physician has recommended you make the following change in your medication:  1. STOP Lopressor 2. START Toprol 100 mg daily 3. START Bidil 20/37.5 mg three times daily  (Isosorbide & Hydralazine combination pill)  If you need a refill on your cardiac medications before your next appointment, please call your pharmacy.   Labwork: None ordered  Testing/Procedures: Please move your echo appointment out to the end of October or beginning of November.  Please have it completed before your scheduled appointment with Dr. Ladon Applebaum on November 6th.  Follow-Up: Your physician recommends that you schedule a follow-up appointment in: 3 months with Dr. Elberta Fortis.  Thank you for choosing [facility]!!   Dory Horn, RN [phone]  Any Other Special Instructions Will Be Listed Below (If Applicable).

## 2017-03-16 NOTE — Progress Notes (Signed)
Electrophysiology Office Note   Date:  03/16/2017   ID:  Kevin [name], DOB 07/20/1956, MRN [id number]  PCP:  [name], FNP  Cardiologist:  [name] Primary Electrophysiologist:  Shamone Winzer [name], MD    Chief Complaint  Patient presents with  . Advice Only    Persistent Afib/Chronic Systolic CHF     History of Present Illness: Kevin [name] is a 61 y.o. male who is being seen today for the evaluation of CHF at the request of [name], FNP. Presenting today for electrophysiology evaluation. He has a history of chronic systolic heart failure with an EF of 20-25%, nonischemic cardiomyopathy, persistent atrial fibrillation on liquids, hypertension, hyperlipidemia, and substance abuse. He is admitted to [facility] July 2018 for acute on chronic systolic heart failure in the setting of medication noncompliance. He was diuresed 9.7 L. It was thought that he would benefit from an ICD should he demonstrate medication compliance as an outpatient.  Today, he denies symptoms of palpitations, chest pain, shortness of breath, orthopnea, PND, claudication, dizziness, presyncope, syncope, bleeding, or neurologic sequela. The patient is tolerating medications without difficulties. He does have some lower extremity edema that is worse as the day goes on. Otherwise he has been feeling well. He has minimal shortness of breath. He has been compliant with his medications and low salt diet since leaving the hospital. He says he didn't realize that it was that important.   Past Medical History:  Diagnosis Date  . Asthma   . Chronic systolic CHF (congestive heart failure) (HCC)    a. 06/2015 Echo: EF 25-30%, mod-sev MR, PASP . b. 08/2016: echo showing EF of 20-25% with diffuse HK  . Cocaine use   . Essential hypertension   . Longstanding persistent atrial fibrillation (HCC)    a. Initially dx 06/2015, paroxysmal at that time. b. Recurrent in Feb 2018 and beyond.  . Moderate mitral  regurgitation    a. 06/2015 Echo: Mod-Sev MR. b. 08/2016: echo showing moderate MR.   Marland Kitchen NICM (nonischemic cardiomyopathy) (HCC)    a. 06/2015 Echo: EF 25-30%, normal cors by cath - ? Tachy-mediated;    . Non-obstructive CAD    a. 06/2015 Cath: LM nl, LAD 87m, LCX nl, RCA nl, EF 25-30%.  . Tobacco use    Past Surgical History:  Procedure Laterality Date  . CARDIAC CATHETERIZATION N/A 07/26/2015   Procedure: Left Heart Cath and Coronary Angiography;  Surgeon: Corky Crafts, MD;  Location[facility] INVASIVE CV LAB;  Service: Cardiovascular;  Laterality: N/A;     Current Outpatient Prescriptions  Medication Sig Dispense Refill  . albuterol (PROVENTIL HFA;VENTOLIN HFA) 108 (90 Base) MCG/ACT inhaler Inhale 2 puffs into the lungs every 6 (six) hours as needed for wheezing or shortness of breath. 1 Inhaler 3  . apixaban (ELIQUIS) 5 MG TABS tablet Take 1 tablet (5 mg total) by mouth 2 (two) times daily. 60 tablet 2  . furosemide (LASIX) 40 MG tablet Take 1 tablet (40 mg total) by mouth 2 (two) times daily. 60 tablet 2  . lansoprazole (PREVACID) 30 MG capsule Take 30 mg by mouth daily as needed (for heartburn/indigestion).    . metoprolol tartrate (LOPRESSOR) 50 MG tablet Take 1 tablet (50 mg total) by mouth 2 (two) times daily. 180 tablet 3  . potassium chloride SA (K-DUR,KLOR-CON) 20 MEQ tablet Take 1 tablet (20 mEq total) by mouth daily. 30 tablet 2   No current facility-administered medications for this visit.  Allergies:   Patient has no known allergies.   Social History:  The patient  reports that he has quit smoking. His smoking use included Cigarettes. He has a 20.00 pack-year smoking history. He has never used smokeless tobacco. He reports that he drinks alcohol. He reports that he uses drugs.   Family History:  The patient's family history includes Hypertension in his father.    ROS:  Please see the history of present illness.   Otherwise, review of systems is positive for  Palpitations, dyspnea on exertion, nausea.   All other systems are reviewed and negative.    PHYSICAL EXAM: VS:  BP 122/82   Pulse (!) 58   Ht 6\' 1"  (1.854 m)   Wt 221 lb (100.2 kg)   SpO2 98%   BMI 29.16 kg/m  , BMI Body mass index is 29.16 kg/m. GEN: Well nourished, well developed, in no acute distress  HEENT: normal  Neck: no JVD, carotid bruits, or masses Cardiac: iRRR; no murmurs, rubs, or gallops,no edema  Respiratory:  clear to auscultation bilaterally, normal work of breathing GI: soft, nontender, nondistended, + BS MS: no deformity or atrophy  Skin: warm and dry Neuro:  Strength and sensation are intact Psych: euthymic mood, full affect  EKG:  EKG is not ordered today. Personal review of the ekg ordered 7/13/18shows AF, rate 153, prolonged QTc   Recent Labs: 09/15/2016: TSH 2.335; TSH 2.361 02/04/2017: ALT 29; B Natriuretic Peptide 655.0 02/09/2017: Hemoglobin 14.1; Platelets 239 02/24/2017: BUN 19; Creatinine, Ser 1.39; Potassium 4.5; Sodium 139    Lipid Panel     Component Value Date/Time   CHOL 120 07/26/2015 0340   TRIG 103 07/26/2015 0340   HDL 27 (L) 07/26/2015 0340   CHOLHDL 4.4 07/26/2015 0340   VLDL 21 07/26/2015 0340   LDLCALC 72 07/26/2015 0340     Wt Readings from Last 3 Encounters:  03/16/17 221 lb (100.2 kg)  02/24/17 217 lb 6.4 oz (98.6 kg)  02/11/17 209 lb 6.4 oz (95 kg)      Other studies Reviewed: Additional studies/ records that were reviewed today include: TTE 09/16/16  Review of the above records today demonstrates:  - Left ventricle: The cavity size was mildly dilated. Wall   thickness was normal. Systolic function was severely reduced. The   estimated ejection fraction was in the range of 20% to 25%.   Diffuse hypokinesis. Indeterminant diastolic function (atrial   fibrillation). - Aortic valve: There was no stenosis. - Mitral valve: There was moderate regurgitation. - Left atrium: The atrium was severely dilated. - Right  ventricle: The cavity size was normal. Systolic function   was mildly to moderately reduced. - Right atrium: The atrium was moderately dilated. - Tricuspid valve: Peak RV-RA gradient (S): 23 mm Hg. - Pulmonary arteries: PA peak pressure: 38 mm Hg (S). - Systemic veins: IVC measured 3.3 cm with < 50% respirophasic   variation, suggesting RA pressure 15 mmHg.  LHC 12/16  Mild nonobstructive CAD, most notably in the LAD.  There is severe left ventricular systolic dysfunction.   ASSESSMENT AND PLAN:  1.  Chronic systolic heart failure: Ejection fraction 20-25% in 2018 with repeat echo pending. With nonobstructive coronary disease thought to be a nonischemic cardiomyopathy. Recent admission for heart failure exacerbation in the setting of medication noncompliance. Currently on Toprol-XL and Lasix. Is not on an ACE inhibitor or ARB due to hypotension. His blood pressure has improved today as his heart rate has improved as well. We'll  switch him to Toprol-XL. We'll also start him on hydralazine and BiDil. I encouraged him to be compliant with his medications. We also talked about defibrillator implantation and we Athel Merriweather discuss it further at the next visit. We'll get an echocardiogram prior to that visit.  2. Persistent atrial fibrillation: Currently on Toprol-XL and Eliquis.  This patients CHA2DS2-VASc Score and unadjusted Ischemic Stroke Rate (% per year) is equal to 2.2 % stroke rate/year from a score of 2  Above score calculated as 1 point each if present [CHF, HTN, DM, Vascular=MI/PAD/Aortic Plaque, Age if 65-74, or Male] Above score calculated as 2 points each if present [Age > 75, or Stroke/TIA/TE]  3. Low blood pressure: Has recovered with improved heart rate. Plan to titrate heart failure medications.  4. Stage III CKG: Baseline creatinine 1.2-1.4. At baseline on most recent check.  Current medicines are reviewed at length with the patient today.   The patient does not have concerns  regarding his medicines.  The following changes were made today:  Hydralazine, BiDil, switch metoprolol to Toprol-XL  Labs/ tests ordered today include:  No orders of the defined types were placed in this encounter.    Disposition:   FU with Cesar Rogerson 3 months  Signed, Xaviar Lunn [name], MD  03/16/2017 2:35 PM     [facility] [address] [place] [address] [phone] (office) [phone] (fax)

## 2017-04-04 ENCOUNTER — Emergency Department (HOSPITAL_COMMUNITY): Payer: Self-pay

## 2017-04-04 ENCOUNTER — Inpatient Hospital Stay (HOSPITAL_COMMUNITY)
Admission: EM | Admit: 2017-04-04 | Discharge: 2017-04-10 | DRG: 291 | Disposition: A | Discharge status: Home / Self Care | Payer: Self-pay | Attending: Internal Medicine | Admitting: Internal Medicine

## 2017-04-04 DIAGNOSIS — I130 Hypertensive heart and chronic kidney disease with heart failure and stage 1 through stage 4 chronic kidney disease, or unspecified chronic kidney disease: Principal | ICD-10-CM | POA: Diagnosis present

## 2017-04-04 DIAGNOSIS — J189 Pneumonia, unspecified organism: Secondary | ICD-10-CM

## 2017-04-04 DIAGNOSIS — I481 Persistent atrial fibrillation: Secondary | ICD-10-CM | POA: Diagnosis present

## 2017-04-04 DIAGNOSIS — Z23 Encounter for immunization: Secondary | ICD-10-CM

## 2017-04-04 DIAGNOSIS — N179 Acute kidney failure, unspecified: Secondary | ICD-10-CM | POA: Diagnosis present

## 2017-04-04 DIAGNOSIS — I4819 Other persistent atrial fibrillation: Secondary | ICD-10-CM

## 2017-04-04 DIAGNOSIS — Z7901 Long term (current) use of anticoagulants: Secondary | ICD-10-CM

## 2017-04-04 DIAGNOSIS — N182 Chronic kidney disease, stage 2 (mild): Secondary | ICD-10-CM | POA: Diagnosis present

## 2017-04-04 DIAGNOSIS — I480 Paroxysmal atrial fibrillation: Secondary | ICD-10-CM | POA: Diagnosis present

## 2017-04-04 DIAGNOSIS — J9621 Acute and chronic respiratory failure with hypoxia: Secondary | ICD-10-CM | POA: Diagnosis present

## 2017-04-04 DIAGNOSIS — I5023 Acute on chronic systolic (congestive) heart failure: Secondary | ICD-10-CM | POA: Diagnosis present

## 2017-04-04 DIAGNOSIS — Z95810 Presence of automatic (implantable) cardiac defibrillator: Secondary | ICD-10-CM

## 2017-04-04 DIAGNOSIS — I255 Ischemic cardiomyopathy: Secondary | ICD-10-CM | POA: Diagnosis present

## 2017-04-04 DIAGNOSIS — I5022 Chronic systolic (congestive) heart failure: Secondary | ICD-10-CM | POA: Diagnosis present

## 2017-04-04 DIAGNOSIS — J449 Chronic obstructive pulmonary disease, unspecified: Secondary | ICD-10-CM | POA: Diagnosis present

## 2017-04-04 DIAGNOSIS — I509 Heart failure, unspecified: Secondary | ICD-10-CM

## 2017-04-04 DIAGNOSIS — I959 Hypotension, unspecified: Secondary | ICD-10-CM | POA: Diagnosis present

## 2017-04-04 DIAGNOSIS — Z79899 Other long term (current) drug therapy: Secondary | ICD-10-CM

## 2017-04-04 DIAGNOSIS — Z8249 Family history of ischemic heart disease and other diseases of the circulatory system: Secondary | ICD-10-CM

## 2017-04-04 DIAGNOSIS — Z9889 Other specified postprocedural states: Secondary | ICD-10-CM

## 2017-04-04 DIAGNOSIS — E785 Hyperlipidemia, unspecified: Secondary | ICD-10-CM | POA: Diagnosis present

## 2017-04-04 DIAGNOSIS — I5082 Biventricular heart failure: Secondary | ICD-10-CM

## 2017-04-04 DIAGNOSIS — I2510 Atherosclerotic heart disease of native coronary artery without angina pectoris: Secondary | ICD-10-CM | POA: Diagnosis present

## 2017-04-04 DIAGNOSIS — Z87891 Personal history of nicotine dependence: Secondary | ICD-10-CM

## 2017-04-04 DIAGNOSIS — R0902 Hypoxemia: Secondary | ICD-10-CM

## 2017-04-04 DIAGNOSIS — I420 Dilated cardiomyopathy: Secondary | ICD-10-CM

## 2017-04-04 LAB — BASIC METABOLIC PANEL
Anion gap: 7 (ref 5–15)
Anion gap: 8 (ref 5–15)
BUN: 16 mg/dL (ref 6–20)
BUN: 15 mg/dL (ref 6–20)
CHLORIDE: 105 mmol/L (ref 101–111)
CHLORIDE: 104 mmol/L (ref 101–111)
CO2: 29 mmol/L (ref 22–32)
CO2: 29 mmol/L (ref 22–32)
Calcium: 9.1 mg/dL (ref 8.9–10.3)
Calcium: 8.9 mg/dL (ref 8.9–10.3)
Creatinine, Ser: 1.07 mg/dL (ref 0.61–1.24)
Creatinine, Ser: 1.03 mg/dL (ref 0.61–1.24)
GFR calc non Af Amer: >60 mL/min (ref >60)
Glucose, Bld: 108 mg/dL — ABNORMAL HIGH (ref 65–99)
Glucose, Bld: 88 mg/dL (ref 65–99)
POTASSIUM: 3.8 mmol/L (ref 3.5–5.1)
Potassium: 3.8 mmol/L (ref 3.5–5.1)
SODIUM: 141 mmol/L (ref 135–145)
SODIUM: 141 mmol/L (ref 135–145)

## 2017-04-04 LAB — CBC
HEMATOCRIT: 43.4 % (ref 39.0–52.0)
Hemoglobin: 14.3 g/dL (ref 13.0–17.0)
MCH: 26.4 pg (ref 26.0–34.0)
MCHC: 32.9 g/dL (ref 30.0–36.0)
MCV: 80.1 fL (ref 78.0–100.0)
Platelets: 192 10*3/uL (ref 150–400)
RBC: 5.42 MIL/uL (ref 4.22–5.81)
RDW: 14.5 % (ref 11.5–15.5)
WBC: 8 10*3/uL (ref 4.0–10.5)

## 2017-04-04 LAB — CBC WITH DIFFERENTIAL/PLATELET
BASOS ABS: 0.1 10*3/uL (ref 0.0–0.1)
BASOS PCT: 1 %
EOS ABS: 0.2 10*3/uL (ref 0.0–0.7)
Eosinophils Relative: 3 %
HCT: 43.3 % (ref 39.0–52.0)
HEMOGLOBIN: 14.3 g/dL (ref 13.0–17.0)
Lymphocytes Relative: 31 %
Lymphs Abs: 2.5 10*3/uL (ref 0.7–4.0)
MCH: 26.7 pg (ref 26.0–34.0)
MCHC: 33 g/dL (ref 30.0–36.0)
MCV: 80.8 fL (ref 78.0–100.0)
MONOS PCT: 13 %
Monocytes Absolute: 1.1 10*3/uL — ABNORMAL HIGH (ref 0.1–1.0)
NEUTROS ABS: 4.3 10*3/uL (ref 1.7–7.7)
NEUTROS PCT: 52 %
Platelets: 193 10*3/uL (ref 150–400)
RBC: 5.36 MIL/uL (ref 4.22–5.81)
RDW: 14.6 % (ref 11.5–15.5)
WBC: 8.2 10*3/uL (ref 4.0–10.5)

## 2017-04-04 LAB — TROPONIN I

## 2017-04-04 LAB — BRAIN NATRIURETIC PEPTIDE
B NATRIURETIC PEPTIDE 5: 1193.3 pg/mL — AB (ref 0.0–100.0)
B Natriuretic Peptide: 1339.9 pg/mL — ABNORMAL HIGH (ref 0.0–100.0)

## 2017-04-04 LAB — MAGNESIUM: Magnesium: 1.5 mg/dL — ABNORMAL LOW (ref 1.7–2.4)

## 2017-04-04 LAB — TSH: TSH: 1.415 u[IU]/mL (ref 0.350–4.500)

## 2017-04-04 LAB — LACTIC ACID, PLASMA
LACTIC ACID, VENOUS: 1.1 mmol/L (ref 0.5–1.9)
Lactic Acid, Venous: 1.1 mmol/L (ref 0.5–1.9)

## 2017-04-04 LAB — I-STAT TROPONIN, ED: TROPONIN I, POC: 0.01 ng/mL (ref 0.00–0.08)

## 2017-04-04 MED ORDER — POTASSIUM CHLORIDE CRYS ER 20 MEQ PO TBCR
20.0000 meq | EXTENDED_RELEASE_TABLET | Freq: Two times a day (BID) | ORAL | Status: DC
  Administered 2017-04-04 – 2017-04-10 (×12): 20 meq via ORAL
  Filled 2017-04-04 (×12): qty 1

## 2017-04-04 MED ORDER — SODIUM CHLORIDE 0.9 % IV SOLN
250.0000 mL | INTRAVENOUS | Status: DC | PRN
  Administered 2017-04-09: 12:00:00 via INTRAVENOUS

## 2017-04-04 MED ORDER — FUROSEMIDE 10 MG/ML IJ SOLN
40.0000 mg | Freq: Once | INTRAMUSCULAR | Status: AC
  Administered 2017-04-04: 40 mg via INTRAVENOUS
  Filled 2017-04-04: qty 4

## 2017-04-04 MED ORDER — SODIUM CHLORIDE 0.9% FLUSH: 3.0000 mL | INTRAVENOUS | Status: DC | PRN

## 2017-04-04 MED ORDER — METOPROLOL TARTRATE 5 MG/5ML IV SOLN
2.5000 mg | INTRAVENOUS | Status: DC | PRN
  Administered 2017-04-07: 2.5 mg via INTRAVENOUS
  Filled 2017-04-04: qty 5

## 2017-04-04 MED ORDER — CARVEDILOL 6.25 MG PO TABS
6.2500 mg | ORAL_TABLET | Freq: Two times a day (BID) | ORAL | Status: DC
Start: 2017-04-04 — End: 2017-04-04

## 2017-04-04 MED ORDER — FUROSEMIDE 10 MG/ML IJ SOLN
80.0000 mg | Freq: Two times a day (BID) | INTRAMUSCULAR | Status: DC
  Administered 2017-04-04 – 2017-04-08 (×8): 80 mg via INTRAVENOUS
  Filled 2017-04-04 (×9): qty 8

## 2017-04-04 MED ORDER — ACETAMINOPHEN 325 MG PO TABS: 650.0000 mg | ORAL_TABLET | ORAL | Status: DC | PRN

## 2017-04-04 MED ORDER — PIPERACILLIN-TAZOBACTAM 3.375 G IVPB 30 MIN
3.3750 g | Freq: Once | INTRAVENOUS | Status: AC
  Administered 2017-04-04: 3.375 g via INTRAVENOUS
  Filled 2017-04-04: qty 50

## 2017-04-04 MED ORDER — APIXABAN 5 MG PO TABS
5.0000 mg | ORAL_TABLET | Freq: Two times a day (BID) | ORAL | Status: DC
  Administered 2017-04-04 – 2017-04-10 (×13): 5 mg via ORAL
  Filled 2017-04-04 (×13): qty 1

## 2017-04-04 MED ORDER — PNEUMOCOCCAL VAC POLYVALENT 25 MCG/0.5ML IJ INJ
0.5000 mL | INJECTION | INTRAMUSCULAR | Status: AC
  Administered 2017-04-05: 0.5 mL via INTRAMUSCULAR
  Filled 2017-04-04: qty 0.5

## 2017-04-04 MED ORDER — ENOXAPARIN SODIUM 40 MG/0.4ML ~~LOC~~ SOLN: 40.0000 mg | SUBCUTANEOUS | Status: DC

## 2017-04-04 MED ORDER — ISOSORB DINITRATE-HYDRALAZINE 20-37.5 MG PO TABS
1.0000 | ORAL_TABLET | Freq: Three times a day (TID) | ORAL | Status: DC
  Administered 2017-04-04 – 2017-04-05 (×2): 1 via ORAL
  Filled 2017-04-04 (×3): qty 1

## 2017-04-04 MED ORDER — ALBUTEROL SULFATE (2.5 MG/3ML) 0.083% IN NEBU: 2.5000 mg | INHALATION_SOLUTION | Freq: Four times a day (QID) | RESPIRATORY_TRACT | Status: DC | PRN

## 2017-04-04 MED ORDER — RAMIPRIL 1.25 MG PO CAPS
1.2500 mg | ORAL_CAPSULE | Freq: Two times a day (BID) | ORAL | Status: DC
  Administered 2017-04-04: 1.25 mg via ORAL
  Filled 2017-04-04 (×2): qty 1

## 2017-04-04 MED ORDER — ONDANSETRON HCL 4 MG/2ML IJ SOLN: 4.0000 mg | Freq: Four times a day (QID) | INTRAMUSCULAR | Status: DC | PRN

## 2017-04-04 MED ORDER — SODIUM CHLORIDE 0.9% FLUSH
3.0000 mL | Freq: Two times a day (BID) | INTRAVENOUS | Status: DC
  Administered 2017-04-04 – 2017-04-10 (×12): 3 mL via INTRAVENOUS

## 2017-04-04 MED ORDER — METOPROLOL SUCCINATE ER 100 MG PO TB24
100.0000 mg | ORAL_TABLET | Freq: Every day | ORAL | Status: DC
  Administered 2017-04-04: 100 mg via ORAL
  Filled 2017-04-04: qty 1

## 2017-04-04 MED ORDER — VANCOMYCIN HCL 10 G IV SOLR
1500.0000 mg | Freq: Once | INTRAVENOUS | Status: AC
  Administered 2017-04-04: 1500 mg via INTRAVENOUS
  Filled 2017-04-04: qty 1500

## 2017-04-04 NOTE — ED Notes (Signed)
Bed: EC95 Expected date:  Expected time:  Means of arrival:  Comments: EMS- 61 yo SOB

## 2017-04-04 NOTE — Progress Notes (Signed)
ED-RN may call report to [facility], [phone], at 1:50pm Earnest Conroy. Clelia Croft, RN

## 2017-04-04 NOTE — H&P (Signed)
History and Physical    [name] HDQ:[id number] DOB: Dec 07, 1955 DOA: 04/04/2017  PCP: [name], FNP Patient coming from: Home  Chief Complaint: Shortness of breath  HPI: Kevin [name] is a 61 y.o. male with medical history significant of known ischemic cardiomyopathy ejection fraction 25-20% echocardiogram done in February 2018 followed by Dr. Elberta Fortis  and Dr. [name]. Patient was seen by Dr. Elberta Fortis on 03/16/2017 for nonischemic cardiomyopathy with decreased ejection fraction 20-25% with persistent atrial fibrillation. Patient was noncompliant to medications at that time however he reports now that he has been compliant with all his medications he has been taking them as prescribed. Today he presented with increasing shortness of breath dyspnea on exertion and orthopnea. He denied any fever chills cough or chest pain. However he reports that   his dry weight is 209 pounds. He was admitted to the hospital recently at that time his weight was through 132 and when he was discharged his weight went down to 209. Currently his weight is 220 pounds and he has been retaining fluid in both lower extremities. He denies any dietary noncompliance. He feels like it is his lungs it is his asthma causing him all this issues. And is requesting to see a pulmonologist during this admission. He does complain of some nausea but denies any vomiting diarrhea. He denies any abdominal pain urinary complaints at this time. He does have a history of cocaine abuse and tobacco abuse. However he denies any of this during this admission.   ED Course:  He was found to have a BNP of more than thousand chest x-ray consistent with mild CHF and cardiomegaly evidence of a right lung base new density suspicious for pneumonia with small pleural effusion. He was treated with antibiotics and Lasix in the ER antibiotics were given for possible pneumonia with a chest x-ray findings the patient has no leukocytosis. Review of Systems:  As per HPI otherwise all other systems reviewed and are negative  Ambulatory Status: patient is ambulatory in fact his pulse ox dropped to 87% upon ambulation. His saturation is 99% when he is resting without oxygen.  Past Medical History:  Diagnosis Date  . Asthma   . Chronic systolic CHF (congestive heart failure) (HCC)    a. 06/2015 Echo: EF 25-30%, mod-sev MR, PASP . b. 08/2016: echo showing EF of 20-25% with diffuse HK  . Cocaine use   . Essential hypertension   . Longstanding persistent atrial fibrillation (HCC)    a. Initially dx 06/2015, paroxysmal at that time. b. Recurrent in Feb 2018 and beyond.  . Moderate mitral regurgitation    a. 06/2015 Echo: Mod-Sev MR. b. 08/2016: echo showing moderate MR.   Marland Kitchen NICM (nonischemic cardiomyopathy) (HCC)    a. 06/2015 Echo: EF 25-30%, normal cors by cath - ? Tachy-mediated;    . Non-obstructive CAD    a. 06/2015 Cath: LM nl, LAD 106m, LCX nl, RCA nl, EF 25-30%.  . Tobacco use     Past Surgical History:  Procedure Laterality Date  . CARDIAC CATHETERIZATION N/A 07/26/2015   Procedure: Left Heart Cath and Coronary Angiography;  Surgeon: Corky Crafts, MD;  Location: [facility] INVASIVE CV LAB;  Service: Cardiovascular;  Laterality: N/A;    Social History   Social History  . Marital status: Single    Spouse name: N/A  . Number of children: N/A  . Years of education: N/A   Occupational History  . Not on file.   Social History Main Topics  .  Smoking status: Former Smoker    Packs/day: 0.50    Years: 40.00    Types: Cigarettes  . Smokeless tobacco: Never Used     Comment: Quit in 2014  . Alcohol use 0.0 oz/week     Comment: 2-3 drinks per week.  . Drug use: Yes     Comment: Not currently. Quit in 2014. 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  . Sexual activity: No   Other Topics Concern  . Not on file   Social History Narrative  . No narrative on file    No Known Allergies  Family History  Problem Relation Age of Onset  .  Hypertension Father       Prior to Admission medications   Medication Sig Start Date End Date Taking? Authorizing Provider  albuterol (PROVENTIL HFA;VENTOLIN HFA) 108 (90 Base) MCG/ACT inhaler Inhale 2 puffs into the lungs every 6 (six) hours as needed for wheezing or shortness of breath. 08/20/15  Yes Newman Nip, NP  apixaban (ELIQUIS) 5 MG TABS tablet Take 1 tablet (5 mg total) by mouth 2 (two) times daily. 02/11/17  Yes Dunn, Dayna N, PA-C  furosemide (LASIX) 40 MG tablet Take 1 tablet (40 mg total) by mouth 2 (two) times daily. 02/11/17  Yes Dunn, Dayna N, PA-C  lansoprazole (PREVACID) 30 MG capsule Take 30 mg by mouth daily as needed (for heartburn/indigestion).   Yes [provider]  metoprolol succinate (TOPROL-XL) 100 MG 24 hr tablet Take 1 tablet (100 mg total) by mouth daily. Take with or immediately following a meal. 03/16/17  Yes Camnitz, Will Daphine Deutscher, MD  potassium chloride SA (K-DUR,KLOR-CON) 20 MEQ tablet Take 1 tablet (20 mEq total) by mouth daily. 02/11/17  Yes Dunn, Dayna N, PA-C  isosorbide-hydrALAZINE (BIDIL) 20-37.5 MG tablet Take 1 tablet by mouth 3 (three) times daily. Patient not taking: Reported on 04/04/2017 03/16/17   Regan Lemming, MD    Physical Exam: Vitals:   04/04/17 0836 04/04/17 1039 04/04/17 1140  BP: (!) 131/119 115/86 (!) 131/100  Pulse: 89 100 (!) 115  Resp: Temp: (!) 97.5 F (36.4 C)    TempSrc: Oral    SpO2: 100% 97% 100%     General: Appears calm and comfortable Eyes: PERRL, EOMI, normal lids, iris WUJ:[id number] normal hearing, lips & tongue, mmm Neck: no LAD, masses or thyromegaly Cardiovascular: RRR, no m/r/g. No LE edema.  Respiratory:  CTA bilaterally, no w/r/r. Normal respiratory effort. Abdomen: soft, ntnd, NABS Skin: no rash or induration seen on limited exam Musculoskeletal: grossly normal tone BUE/BLE, good ROM, no bony abnormality Psychiatric: grossly normal mood and affect, speech fluent and appropriate,  AOx3 Neurologic: CN 2-12 grossly intact, moves all extremities in coordinated fashion, sensation intact  Labs on Admission: I have personally reviewed following labs and imaging studies  CBC:  Recent Labs Lab 04/04/17 0915  WBC 8.0  HGB 14.3  HCT 43.4  MCV 80.1  PLT 192   Basic Metabolic Panel:  Recent Labs Lab 04/04/17 0915  NA 141  K 3.8  CL 105  CO2 29  GLUCOSE 108*  BUN 16  CREATININE 1.07  CALCIUM 9.1   GFR: CrCl cannot be calculated (Unknown ideal weight.). Liver Function Tests: No results for input(s): AST, ALT, ALKPHOS, BILITOT, PROT, ALBUMIN in the last 168 hours. No results for input(s): LIPASE, AMYLASE in the last 168 hours. No results for input(s): AMMONIA in the last 168 hours. Coagulation Profile: No results for input(s): INR, PROTIME in  the last 168 hours. Cardiac Enzymes: No results for input(s): CKTOTAL, CKMB, CKMBINDEX, TROPONINI in the last 168 hours. BNP (last 3 results) No results for input(s): PROBNP in the last 8760 hours. HbA1C: No results for input(s): HGBA1C in the last 72 hours. CBG: No results for input(s): GLUCAP in the last 168 hours. Lipid Profile: No results for input(s): CHOL, HDL, LDLCALC, TRIG, CHOLHDL, LDLDIRECT in the last 72 hours. Thyroid Function Tests: No results for input(s): TSH, T4TOTAL, FREET4, T3FREE, THYROIDAB in the last 72 hours. Anemia Panel: No results for input(s): VITAMINB12, FOLATE, FERRITIN, TIBC, IRON, RETICCTPCT in the last 72 hours. Urine analysis:    Component Value Date/Time   COLORURINE YELLOW 02/04/2017 0958   APPEARANCEUR CLEAR 02/04/2017 0958   LABSPEC 1.014 02/04/2017 0958   PHURINE 5.0 02/04/2017 0958   GLUCOSEU NEGATIVE 02/04/2017 0958   HGBUR NEGATIVE 02/04/2017 0958   BILIRUBINUR NEGATIVE 02/04/2017 0958   KETONESUR NEGATIVE 02/04/2017 0958   PROTEINUR 100 (A) 02/04/2017 0958   NITRITE NEGATIVE 02/04/2017 0958   LEUKOCYTESUR NEGATIVE 02/04/2017 0958    Creatinine Clearance: CrCl  cannot be calculated (Unknown ideal weight.).  Sepsis Labs: (procalcitonin:4,lacticidven:4) )No results found for this or any previous visit (from the past 240 hour(s)).   Radiological Exams on Admission: Dg Chest 2 View  Result Date: 04/04/2017 CLINICAL DATA:  Increased sob since yesterday, states hx of asthma, no other chest complaints EXAM: CHEST  2 VIEW COMPARISON:  Chest x-rays dated 02/04/2017 and 07/23/2015. FINDINGS: New dense opacity at the right lung base, highly suspicious for pneumonia, probable small associated pleural effusion. Left lung is clear. Cardiomegaly appears stable. Central pulmonary vascular congestion is present, similar to multiple prior exams, suggesting chronic CHF. No pneumothorax seen. No acute or suspicious osseous finding. IMPRESSION: 1. New dense opacity at the right lung base, highly suspicious for pneumonia with associated small pleural effusion. Atelectasis/airspace collapse would be a consideration if afebrile. 2. Cardiomegaly.  Probable mild chronic CHF. Electronically Signed   By: Bary Richard M.D.   On: 04/04/2017 09:19    EKG: Independently reviewed.   Assessment/Plan Active Problems:   CHF (congestive heart failure) (HCC)   Acute on chronic hypoxic respiratory failure-due to congestive heart failure exacerbation plus or minus pneumonia. I believe it's more likely a CHF exacerbation due to the increasing weight and increasing edema with a BNP above thousand creatinine 1.07 with paroxysmal nocturnal dyspnea and orthopnea. On examination he also has crackles at the bases right more than left. Follow-up chest x-ray. Follow-up BNP. Continue his cardiac medications including diuretics and beta blocker. I will add an ACE inhibitor at this time because of her high blood pressure. In the past and ACE inhibitor was not added due to hypotension. Patient does not show any evidence of infection at this time his white count is normal HEENT he is afebrile he  does not have a productive cough. He received antibiotics in the ER however I will hold off on antibiotics at this time. We will place a call to [facility] on call. Please consult the pulmonologist as per patient's request tomorrow. Patient is following up with Dr. Elberta Fortis with for possible defibrillator implantation.  Atrial fibrillation continue Eliquis and rate controlled with beta blocker.  History of asthma patient does not have any wheezing at this time. Continue home inhalers.  CK D stage III creatinine stable 1.07.         DVT prophylaxis: Lovenox Code Status: Full Family Communication: none Disposition Plan: home when stable  Consults called: will place call to [facility] oncall. Admission status: Telemetry   Alwyn Ren MD [facility]  If 7PM-7AM, please contact night-coverage [email or web address] Password [email or web address]  04/04/2017, 1:05 PM

## 2017-04-04 NOTE — ED Notes (Signed)
ED Provider at bedside. 

## 2017-04-04 NOTE — ED Notes (Signed)
Pt ambulated in hallway and oxygen was between 87-89% while ambulating. When pt sat back on bed oxygen was 98%.

## 2017-04-04 NOTE — ED Provider Notes (Signed)
[facility]-EMERGENCY DEPT Provider Note   CSN: [id number] Arrival date & time: 04/04/17  4098     History   Chief Complaint Chief Complaint  Patient presents with  . Shortness of Breath    HPI Kevin [name] is a 61 y.o. male presenting with shortness of breath 2 days.  Patient states that for the past 2 days, he's had increased shortness of breath. This happens both with activity and while at rest. He reports nasal congestion for the past several days. He denies fevers, chills, chest pain, nausea, vomiting, abdominal pain, urinary symptoms. He states has been taking his medications as prescribed, including xarelto. He has a past medical history is indicated for A. fib, CHF, hypertension, and COPD. He denies sick contacts at home. He was admitted to the hospital a month and a half ago for CHF exacerbation. He states that at the time of discharge, he was 210 pounds. He was told that if his weight was ever at or above 230 pounds, he should be evaluated. Recently, he has been weighing at about 220-225 pounds. He reports he is carrying extra fluid on his abdomen and bilateral legs, despite taking Lasix as prescribed.  HPI  Past Medical History:  Diagnosis Date  . Asthma   . Chronic systolic CHF (congestive heart failure) (HCC)    a. 06/2015 Echo: EF 25-30%, mod-sev MR, PASP . b. 08/2016: echo showing EF of 20-25% with diffuse HK  . Cocaine use   . Essential hypertension   . Longstanding persistent atrial fibrillation (HCC)    a. Initially dx 06/2015, paroxysmal at that time. b. Recurrent in Feb 2018 and beyond.  . Moderate mitral regurgitation    a. 06/2015 Echo: Mod-Sev MR. b. 08/2016: echo showing moderate MR.   Marland Kitchen NICM (nonischemic cardiomyopathy) (HCC)    a. 06/2015 Echo: EF 25-30%, normal cors by cath - ? Tachy-mediated;    . Non-obstructive CAD    a. 06/2015 Cath: LM nl, LAD 44m, LCX nl, RCA nl, EF 25-30%.  . Tobacco use     Patient Active Problem List   Diagnosis Date Noted    . CHF (congestive heart failure) (HCC) 04/04/2017  . Current use of long term anticoagulation 02/24/2017  . Hypotension 02/24/2017  . CKD (chronic kidney disease), stage II 02/08/2017  . History of cocaine use 02/08/2017  . Former tobacco use 02/08/2017  . Atrial fibrillation with rapid ventricular response (HCC)   . Longstanding persistent atrial fibrillation (HCC) 10/19/2016  . AKI (acute kidney injury) (HCC) 10/19/2016  . Mitral regurgitation 09/28/2016  . Essential hypertension 09/28/2016  . Acute on chronic systolic heart failure (HCC) 09/15/2016  . Non-ischemic cardiomyopathy (HCC)   . Coronary artery disease involving native coronary artery of native heart without angina pectoris   . Chronic systolic CHF (congestive heart failure) (HCC) 07/26/2015  . Paroxysmal atrial fibrillation (HCC) 07/23/2015  . COPD (chronic obstructive pulmonary disease) (HCC) 07/23/2015  . Elevated troponin 07/23/2015  . Elevated brain natriuretic peptide (BNP) level 07/23/2015    Past Surgical History:  Procedure Laterality Date  . CARDIAC CATHETERIZATION N/A 07/26/2015   Procedure: Left Heart Cath and Coronary Angiography;  Surgeon: Corky Crafts, MD;  Location: [facility] INVASIVE CV LAB;  Service: Cardiovascular;  Laterality: N/A;       Home Medications    Prior to Admission medications   Medication Sig Start Date End Date Taking? Authorizing Provider  albuterol (PROVENTIL HFA;VENTOLIN HFA) 108 (90 Base) MCG/ACT inhaler Inhale 2 puffs into the lungs every  6 (six) hours as needed for wheezing or shortness of breath. 08/20/15  Yes Newman Nip, NP  apixaban (ELIQUIS) 5 MG TABS tablet Take 1 tablet (5 mg total) by mouth 2 (two) times daily. 02/11/17  Yes Dunn, Dayna N, PA-C  furosemide (LASIX) 40 MG tablet Take 1 tablet (40 mg total) by mouth 2 (two) times daily. 02/11/17  Yes Dunn, Dayna N, PA-C  lansoprazole (PREVACID) 30 MG capsule Take 30 mg by mouth daily as needed (for  heartburn/indigestion).   Yes [provider]  metoprolol succinate (TOPROL-XL) 100 MG 24 hr tablet Take 1 tablet (100 mg total) by mouth daily. Take with or immediately following a meal. 03/16/17  Yes Camnitz, Will Daphine Deutscher, MD  potassium chloride SA (K-DUR,KLOR-CON) 20 MEQ tablet Take 1 tablet (20 mEq total) by mouth daily. 02/11/17  Yes Dunn, Dayna N, PA-C  isosorbide-hydrALAZINE (BIDIL) 20-37.5 MG tablet Take 1 tablet by mouth 3 (three) times daily. Patient not taking: Reported on 04/04/2017 03/16/17   Regan Lemming, MD    Family History Family History  Problem Relation Age of Onset  . Hypertension Father     Social History Social History  Substance Use Topics  . Smoking status: Former Smoker    Packs/day: 0.50    Years: 40.00    Types: Cigarettes  . Smokeless tobacco: Never Used     Comment: Quit in 2014  . Alcohol use 0.0 oz/week     Comment: 2-3 drinks per week.     Allergies   Patient has no known allergies.   Review of Systems Review of Systems  Constitutional: Negative for chills and fever.  HENT: Positive for congestion. Negative for sore throat.   Respiratory: Positive for shortness of breath. Negative for chest tightness.   Cardiovascular: Positive for leg swelling (bilateral). Negative for chest pain and palpitations.  Gastrointestinal: Positive for abdominal distention. Negative for abdominal pain, constipation, diarrhea, nausea and vomiting.  Genitourinary: Negative for dysuria, frequency and hematuria.  Musculoskeletal: Negative for back pain and neck pain.  Skin: Negative for wound.  Allergic/Immunologic: Negative for immunocompromised state.  Neurological: Negative for dizziness, light-headedness and headaches.  Hematological: Bruises/bleeds easily.     Physical Exam Updated Vital Signs BP 117/76 (BP Location: Left Arm)   Pulse (!) 106   Temp 97.8 F (36.6 C) (Oral)   Resp (!) 22   Ht  (1.854 m)   Wt 105.6 kg (232 lb 12.9 oz)    SpO2 97%   BMI 30.71 kg/m   Physical Exam  Constitutional: He is oriented to person, place, and time. He appears well-developed and well-nourished. No distress.  HENT:  Head: Normocephalic and atraumatic.  Right Ear: Tympanic membrane, external ear and ear canal normal.  Left Ear: Tympanic membrane, external ear and ear canal normal.  Nose: Mucosal edema present. Right sinus exhibits no maxillary sinus tenderness and no frontal sinus tenderness. Left sinus exhibits no maxillary sinus tenderness and no frontal sinus tenderness.  Mouth/Throat: Uvula is midline, oropharynx is clear and moist and mucous membranes are normal.  Eyes: Pupils are equal, round, and reactive to light. EOM are normal.  Neck: Normal range of motion.  Cardiovascular: Normal rate, regular rhythm and intact distal pulses.   Pulmonary/Chest: Effort normal. No respiratory distress. He has no wheezes.  Distant breath sounds in all lung fields. No obvious wheezing  Abdominal: Soft. Bowel sounds are normal. He exhibits distension. He exhibits no mass. There is no tenderness. There is no guarding.  Musculoskeletal: Normal range of motion. He exhibits edema.  Bilateral pitting edema extending up to the knee. Pedal pulses intact bilaterally. No pain or swelling of posterior calves.  Neurological: He is alert and oriented to person, place, and time.  Skin: Skin is warm and dry.  Psychiatric: He has a normal mood and affect.  Nursing note and vitals reviewed.    ED Treatments / Results  Labs (all labs ordered are listed, but only abnormal results are displayed) Labs Reviewed  BASIC METABOLIC PANEL - Abnormal; Notable for the following:       Result Value   Glucose, Bld 108 (*)    All other components within normal limits  BRAIN NATRIURETIC PEPTIDE - Abnormal; Notable for the following:    B Natriuretic Peptide 1,193.3 (*)    All other components within normal limits  BRAIN NATRIURETIC PEPTIDE - Abnormal; Notable for the  following:    B Natriuretic Peptide 1,339.9 (*)    All other components within normal limits  MAGNESIUM - Abnormal; Notable for the following:    Magnesium 1.5 (*)    All other components within normal limits  CBC WITH DIFFERENTIAL/PLATELET - Abnormal; Notable for the following:    Monocytes Absolute 1.1 (*)    All other components within normal limits  CULTURE, BLOOD (ROUTINE X 2)  CULTURE, BLOOD (ROUTINE X 2)  CBC  LACTIC ACID, PLASMA  LACTIC ACID, PLASMA  BASIC METABOLIC PANEL  TROPONIN I  TSH  TROPONIN I  TROPONIN I  I-STAT TROPONIN, ED    EKG  EKG Interpretation  Date/Time:  Sunday April 04 2017 08:49:23 EDT Ventricular Rate:  127 PR Interval:    QRS Duration: 96 QT Interval:  358 QTC Calculation: 521 R Axis:   14 Text Interpretation:  Atrial fibrillation Nonspecific T abnormalities, lateral leads Prolonged QT interval tachycardic but less than when compared to July 2018 Confirmed by Pricilla Loveless [phone]) on 04/04/2017 10:12:34 AM       Radiology Dg Chest 2 View  Result Date: 04/04/2017 CLINICAL DATA:  Increased sob since yesterday, states hx of asthma, no other chest complaints EXAM: CHEST  2 VIEW COMPARISON:  Chest x-rays dated 02/04/2017 and 07/23/2015. FINDINGS: New dense opacity at the right lung base, highly suspicious for pneumonia, probable small associated pleural effusion. Left lung is clear. Cardiomegaly appears stable. Central pulmonary vascular congestion is present, similar to multiple prior exams, suggesting chronic CHF. No pneumothorax seen. No acute or suspicious osseous finding. IMPRESSION: 1. New dense opacity at the right lung base, highly suspicious for pneumonia with associated small pleural effusion. Atelectasis/airspace collapse would be a consideration if afebrile. 2. Cardiomegaly.  Probable mild chronic CHF. Electronically Signed   By: Bary Richard M.D.   On: 04/04/2017 09:19    Procedures Procedures (including critical care  time)  Medications Ordered in ED Medications  sodium chloride flush (NS) 0.9 % injection 3 mL (not administered)  sodium chloride flush (NS) 0.9 % injection 3 mL (not administered)  0.9 %  sodium chloride infusion (not administered)  acetaminophen (TYLENOL) tablet 650 mg (not administered)  ondansetron (ZOFRAN) injection 4 mg (not administered)  furosemide (LASIX) injection 80 mg (not administered)  potassium chloride SA (K-DUR,KLOR-CON) CR tablet 20 mEq (not administered)  ramipril (ALTACE) capsule 1.25 mg (not administered)  albuterol (PROVENTIL) (2.5 MG/3ML) 0.083% nebulizer solution 2.5 mg (not administered)  isosorbide-hydrALAZINE (BIDIL) 20-37.5 MG per tablet 1 tablet (not administered)  apixaban (ELIQUIS) tablet 5 mg (not administered)  metoprolol succinate (TOPROL-XL) 24  hr tablet 100 mg (not administered)  furosemide (LASIX) injection 40 mg (40 mg Intravenous Given 04/04/17 1100)  piperacillin-tazobactam (ZOSYN) IVPB 3.375 g (0 g Intravenous Stopped 04/04/17 1153)  vancomycin (VANCOCIN) 1,500 mg in sodium chloride 0.9 % 500 mL IVPB (1,500 mg Intravenous New Bag/Given 04/04/17 1153)     Initial Impression / Assessment and Plan / ED Course  I have reviewed the triage vital signs and the nursing notes.  Pertinent labs & imaging results that were available during my care of the patient were reviewed by me and considered in my medical decision making (see chart for details).     Patient presenting with shortness of breath for the past several days. Physical exam shows distant lung sounds, but no wheezing. Patient abdomen and lower legs appear volume overloaded. Will order basic labs, chest x-ray, EKG, BNP for further evaluation.  X-ray shows right-sided pneumonia with small pleural effusion. BMP elevated above 1000, patient usually in the 600s. Ambulation showed patient dropped his sats to 87%, baseline and upper 90s at rest. Will order blood cultures, lactate, start IV antibiotics. Will  hold on IV fluids, as patient appears fluid overloaded. Case discussed with attending, Dr. Criss Alvine evaluated the patient. Considering patient's multiple comorbidities, desaturation, CHF exacerbation, and new pneumonia, will consult with hospitalist regarding admission.  Patient to be admitted.  Final Clinical Impressions(s) / ED Diagnoses   Final diagnoses:  Hypoxemia  HCAP (healthcare-associated pneumonia)  Acute on chronic congestive heart failure, unspecified heart failure type [facility])    New Prescriptions Current Discharge Medication List       Alveria Apley, PA-C 04/04/17 1507    Pricilla Loveless, MD 04/08/17 [phone]

## 2017-04-04 NOTE — Progress Notes (Signed)
A consult was received from an ED physician for Vancomycin and Zosyn per pharmacy dosing.  The patient's profile has been reviewed for ht/wt/allergies/indication/available labs.   A one time order has been placed for Zosyn 3.375g and Vancomycin 1500mg .  Further antibiotics/pharmacy consults should be ordered by admitting physician if indicated.                       Thank you,  Clance Boll, PharmD, BCPS Pager: [phone] 04/04/2017 10:53 AM

## 2017-04-04 NOTE — ED Triage Notes (Signed)
Pt presents via EMS after SOB x 1 day. Hx of asthma. States he has used his MDI that has not helped. 5mg  albuterol given en route. Alert and oriented.

## 2017-04-05 ENCOUNTER — Inpatient Hospital Stay (HOSPITAL_COMMUNITY): Payer: Self-pay

## 2017-04-05 ENCOUNTER — Encounter (HOSPITAL_COMMUNITY): Payer: Self-pay

## 2017-04-05 ENCOUNTER — Other Ambulatory Visit (HOSPITAL_COMMUNITY): Payer: Self-pay

## 2017-04-05 DIAGNOSIS — I509 Heart failure, unspecified: Secondary | ICD-10-CM

## 2017-04-05 DIAGNOSIS — I2510 Atherosclerotic heart disease of native coronary artery without angina pectoris: Secondary | ICD-10-CM

## 2017-04-05 DIAGNOSIS — I5022 Chronic systolic (congestive) heart failure: Secondary | ICD-10-CM

## 2017-04-05 DIAGNOSIS — I420 Dilated cardiomyopathy: Secondary | ICD-10-CM

## 2017-04-05 DIAGNOSIS — I4891 Unspecified atrial fibrillation: Secondary | ICD-10-CM

## 2017-04-05 LAB — BASIC METABOLIC PANEL
Anion gap: 8 (ref 5–15)
BUN: 19 mg/dL (ref 6–20)
CO2: 32 mmol/L (ref 22–32)
CREATININE: 1.25 mg/dL — AB (ref 0.61–1.24)
Calcium: 8.9 mg/dL (ref 8.9–10.3)
Chloride: 102 mmol/L (ref 101–111)
GFR calc Af Amer: >60 mL/min (ref >60)
GFR, EST NON AFRICAN AMERICAN: 60 mL/min — AB (ref >60)
GLUCOSE: 87 mg/dL (ref 65–99)
Potassium: 3.7 mmol/L (ref 3.5–5.1)
SODIUM: 142 mmol/L (ref 135–145)

## 2017-04-05 LAB — GLUCOSE, CAPILLARY: GLUCOSE-CAPILLARY: 88 mg/dL (ref 65–99)

## 2017-04-05 LAB — TROPONIN I

## 2017-04-05 MED ORDER — AMIODARONE LOAD VIA INFUSION
150.0000 mg | Freq: Once | INTRAVENOUS | Status: AC
  Administered 2017-04-05: 150 mg via INTRAVENOUS
  Filled 2017-04-05: qty 83.34

## 2017-04-05 MED ORDER — AMIODARONE HCL IN DEXTROSE 360-4.14 MG/200ML-% IV SOLN
60.0000 mg/h | INTRAVENOUS | Status: AC
  Administered 2017-04-05: 60 mg/h via INTRAVENOUS
  Filled 2017-04-05: qty 200

## 2017-04-05 MED ORDER — AMIODARONE HCL IN DEXTROSE 360-4.14 MG/200ML-% IV SOLN
30.0000 mg/h | INTRAVENOUS | Status: DC
  Filled 2017-04-05 (×2): qty 200

## 2017-04-05 MED ORDER — METOPROLOL SUCCINATE ER 50 MG PO TB24
50.0000 mg | ORAL_TABLET | Freq: Every day | ORAL | Status: DC
  Administered 2017-04-05 – 2017-04-10 (×6): 50 mg via ORAL
  Filled 2017-04-05 (×6): qty 1

## 2017-04-05 NOTE — Plan of Care (Signed)
Problem: Safety: Goal: Ability to remain free from injury will improve Outcome: Progressing Pt. Expresses side effects of lasix's and use of urinal to maintain safety. Verbalizes to call for assistance when needing to ambulate

## 2017-04-05 NOTE — Progress Notes (Signed)
PROGRESS NOTE    Kevin [name]  ZOX:[id number] DOB: [birth year] DOA: 04/04/2017 PCP: [name], FNP  Brief Narrative: This is a 61 year old male with history of nonischemic cardiomyopathy, EF of 20-25%, persistent atrial fibrillation on Apixaban, hypertension, dyslipidemia, history of substance abuse admitted with dyspnea on exertion, felt to be from decompensated systolic CHF.  Assessment & Plan:     Acute on chronic systolic heart failure (HCC) - Continue IV Lasix  q12, and Toprol -his dry weight is about 209 pounds and admission weight was 223 pounds -We will hold BiDil and cut down a.m. dose of Toprol due to soft/low BPs -Monitor urine output and weights -Cardiology consult appreciated -Clinically do not suspect pneumonia, repeat x-ray more suggestive of CHF and atelectasis, no fever or leukocytosis or productive cough-monitor clinically    Paroxysmal atrial fibrillation (HCC) -Mild A. fib with RVR noted unfortunately had to cut down Toprol dose due to soft/low BPs this morning will resume home dose tomorrow as blood pressure tolerates -Started on amiodarone per cards today -May be a poor long-term amiodarone candidate due to COPD -Continue Apixaban    COPD (chronic obstructive pulmonary disease) (HCC) -Stable, nebs when necessary   Mild AKI -Baseline creatinine normal -Suspect cardiorenal and secondary to diuresis -Continue IV Lasix, stopped bidil, monitor closely  DVT prophylaxis:apixaban Code Status: Full Code Family Communication: none at bedside Disposition Plan: Home pending improvement  Consultants:  Cardiology   Subjective: Bruising starting to improve, no cough, no fevers or chills   Objective: Vitals:   04/05/17 0834 04/05/17 1002 04/05/17 1003 04/05/17 1004  BP: 104/83 109/85    Pulse: (!) 118 (!) 192 (!) 110 (!) 106  Resp:      Temp:      TempSrc:      SpO2: 97%     Weight:      Height:        Intake/Output Summary (Last 24 hours) at  04/05/17 1312 Last data filed at 04/05/17 1011  Gross per 24 hour  Intake              970 ml  Output             3550 ml  Net            -2580 ml   Filed Weights   04/04/17 1455  Weight: 105.6 kg (232 lb 12.9 oz)    Examination:  General exam: Appears calm and comfortable, no distress Respiratory system: few basilar crackles Cardiovascular system: S1 & S2 heard, RRR. + JVD Gastrointestinal system: Abdomen is nondistended, soft and nontender. Normal bowel sounds heard. Central nervous system: Alert and oriented. No focal neurological deficits. Extremities: 2plus edema Skin: No rashes, lesions or ulcers Psychiatry: Judgement and insight appear normal. Mood & affect appropriate.     Data Reviewed:   CBC:  Recent Labs Lab 04/04/17 0915 04/04/17 1355  WBC 8.0 8.2  NEUTROABS  --  4.3  HGB 14.3 14.3  HCT 43.4 43.3  MCV 80.1 80.8  PLT 192 193   Basic Metabolic Panel:  Recent Labs Lab 04/04/17 0915 04/04/17 1355 04/05/17 0145  NA 141 141 142  K 3.8 3.8 3.7  CL 105 104 102  CO2 29 29 32  GLUCOSE 108* 88 87  BUN CREATININE 1.07 1.03 1.25*  CALCIUM 9.1 8.9 8.9  MG  --  1.5*  --    GFR: Estimated Creatinine Clearance: 77.2 mL/min (A) (by C-G formula based on SCr  of 1.25 mg/dL (H)). Liver Function Tests: No results for input(s): AST, ALT, ALKPHOS, BILITOT, PROT, ALBUMIN in the last 168 hours. No results for input(s): LIPASE, AMYLASE in the last 168 hours. No results for input(s): AMMONIA in the last 168 hours. Coagulation Profile: No results for input(s): INR, PROTIME in the last 168 hours. Cardiac Enzymes:  Recent Labs Lab 04/04/17 1355 04/04/17 2024 04/05/17 0145  TROPONINI <0.03 <0.03 <0.03   BNP (last 3 results) No results for input(s): PROBNP in the last 8760 hours. HbA1C: No results for input(s): HGBA1C in the last 72 hours. CBG: No results for input(s): GLUCAP in the last 168 hours. Lipid Profile: No results for input(s): CHOL, HDL,  LDLCALC, TRIG, CHOLHDL, LDLDIRECT in the last 72 hours. Thyroid Function Tests:  Recent Labs  04/04/17 1355  TSH 1.415   Anemia Panel: No results for input(s): VITAMINB12, FOLATE, FERRITIN, TIBC, IRON, RETICCTPCT in the last 72 hours. Urine analysis:    Component Value Date/Time   COLORURINE YELLOW 02/04/2017 0958   APPEARANCEUR CLEAR 02/04/2017 0958   LABSPEC 1.014 02/04/2017 0958   PHURINE 5.0 02/04/2017 0958   GLUCOSEU NEGATIVE 02/04/2017 0958   HGBUR NEGATIVE 02/04/2017 0958   BILIRUBINUR NEGATIVE 02/04/2017 0958   KETONESUR NEGATIVE 02/04/2017 0958   PROTEINUR 100 (A) 02/04/2017 0958   NITRITE NEGATIVE 02/04/2017 0958   LEUKOCYTESUR NEGATIVE 02/04/2017 0958   Sepsis Labs: @LABRCNTIP (procalcitonin:4,lacticidven:4)  )No results found for this or any previous visit (from the past 240 hour(s)).       Radiology Studies: Dg Chest 2 View  Result Date: 04/05/2017 CLINICAL DATA:  History of asthma -COPD, CHF, atrial fibrillation, former smoker. The patient is reporting shortness of breath. EXAM: CHEST  2 VIEW COMPARISON:  Chest x-ray of April 04, 2017 FINDINGS: Do left lung is well-expanded and clear. On the right there remains hazy increased density at the lung base. The cardiac silhouette remains enlarged the pulmonary vascularity engorged. The trachea is midline. The bony thorax exhibits no acute abnormality. IMPRESSION: Improving CHF.  Improving right basilar atelectasis or pneumonia. Electronically Signed   By: David  [place] M.D.   On: 04/05/2017 08:10   Dg Chest 2 View  Result Date: 04/04/2017 CLINICAL DATA:  Increased sob since yesterday, states hx of asthma, no other chest complaints EXAM: CHEST  2 VIEW COMPARISON:  Chest x-rays dated 02/04/2017 and 07/23/2015. FINDINGS: New dense opacity at the right lung base, highly suspicious for pneumonia, probable small associated pleural effusion. Left lung is clear. Cardiomegaly appears stable. Central pulmonary vascular  congestion is present, similar to multiple prior exams, suggesting chronic CHF. No pneumothorax seen. No acute or suspicious osseous finding. IMPRESSION: 1. New dense opacity at the right lung base, highly suspicious for pneumonia with associated small pleural effusion. Atelectasis/airspace collapse would be a consideration if afebrile. 2. Cardiomegaly.  Probable mild chronic CHF. Electronically Signed   By: Bary Richard M.D.   On: 04/04/2017 09:19        Scheduled Meds: . amiodarone  150 mg Intravenous Once  . apixaban  5 mg Oral BID  . furosemide  80 mg Intravenous Q12H  . metoprolol succinate  50 mg Oral Daily  . potassium chloride  20 mEq Oral BID  . sodium chloride flush  3 mL Intravenous Q12H   Continuous Infusions: . sodium chloride    . amiodarone     Followed by  . amiodarone       LOS: 1 day    Time spent:  Zannie Cove, MD [facility] Pager [phone]  If 7PM-7AM, please contact night-coverage [email or web address] Password [email or web address] 04/05/2017, 1:12 PM

## 2017-04-05 NOTE — Progress Notes (Addendum)
Patient complained of dizziness and nausea. BP assessed initial 76/58 LA, 96, 18 AFIB. Amiodarone drip turned off until reported to provider. Hospitalist contacted and updated. BP manually LA 90/40, 94. Pt now reports feeling of nausea and dizziness have passed. Pt remains a&ox4. Pt encouraged to be on bedrest at this time.Will continue to monitor.

## 2017-04-05 NOTE — Care Management Note (Signed)
Case Management Note  Patient Details  Name: Kevin [name] MRN: [id number] Date of Birth: 1956-02-17  Subjective/Objective: 61 y/o m admitted w/CHF. From home.   Cardio following. CM referral-EF 20-25%;2 admissions/past 6 months.               Action/Plan:d/c plan home.   Expected Discharge Date:                  Expected Discharge Plan:  Home/Self Care  In-House Referral:     Discharge planning Services  CM Consult  Post Acute Care Choice:    Choice offered to:     DME Arranged:    DME Agency:     HH Arranged:    HH Agency:     Status of Service:  In process, will continue to follow  If discussed at Long Length of Stay Meetings, dates discussed:    Additional Comments:  Lanier Clam, RN 04/05/2017, 11:53 AM

## 2017-04-05 NOTE — Progress Notes (Signed)
  Amiodarone Drug - Drug Interaction Consult Note  Recommendations:  Will d/c PRN ondansetron due to increased risk of QT prolongation per discussion with Dr. Mayford Knife.   Recommend careful monitoring of HR, as patient also on beta blocker.   Amiodarone is metabolized by the cytochrome P450 system and therefore has the potential to cause many drug interactions. Amiodarone has an average plasma half-life of 50 days (range 20 to 100 days).   There is potential for drug interactions to occur several weeks or months after stopping treatment and the onset of drug interactions may be slow after initiating amiodarone.   []  Statins: Increased risk of myopathy. Simvastatin- restrict dose to 20mg  daily. Other statins: counsel patients to report any muscle pain or weakness immediately.  []  Anticoagulants: Amiodarone can increase anticoagulant effect. Consider warfarin dose reduction. Patients should be monitored closely and the dose of anticoagulant altered accordingly, remembering that amiodarone levels take several weeks to stabilize.  []  Antiepileptics: Amiodarone can increase plasma concentration of phenytoin, the dose should be reduced. Note that small changes in phenytoin dose can result in large changes in levels. Monitor patient and counsel on signs of toxicity.  [x]  Beta blockers: increased risk of bradycardia, AV block and myocardial depression. (on Toprol XL)  Sotalol - avoid concomitant use.  []   Calcium channel blockers (diltiazem and verapamil): increased risk of bradycardia, AV block and myocardial depression.  []   Cyclosporine: Amiodarone increases levels of cyclosporine. Reduced dose of cyclosporine is recommended.  []  Digoxin dose should be halved when amiodarone is started.  []  Diuretics: increased risk of cardiotoxicity if hypokalemia occurs.  []  Oral hypoglycemic agents (glyburide, glipizide, glimepiride): increased risk of hypoglycemia. Patient's glucose levels should be  monitored closely when initiating amiodarone therapy.   [x]  Drugs that prolong the QT interval:  Torsades de pointes risk may be increased with concurrent use - avoid if possible.  Monitor QTc, also keep magnesium/potassium WNL if concurrent therapy can't be avoided. Marland Kitchen Antibiotics: e.g. fluoroquinolones, erythromycin. . Antiarrhythmics: e.g. quinidine, procainamide, disopyramide, sotalol. . Antipsychotics: e.g. phenothiazines, haloperidol.  . Lithium, tricyclic antidepressants, and methadone.   Thank Bonita Quin  Jamse Mead  04/05/2017 12:08 PM

## 2017-04-05 NOTE — Consult Note (Signed)
Cardiology Consultation:   Patient ID: Kevin [name]; [id number]; 15-Apr-1956   Admit date: 04/04/2017 Date of Consult: 04/05/2017  Primary Care Provider: [name], FNP Primary Cardiologist: Dr. Rennis [name] Primary Electrophysiologist:  Dr. Elberta [name]  Patient Profile:   Kevin [name] is a 61 y.o. male with a hx of chronic systolic HF with [name] EF 20-25%, nonischemic cardiomyopathy, persistent atrial fibrillation on Eliquis, HTN, HLP, nonobstructive CAD in 2016 cath and substance abuse who is being seen today for the evaluation of CHF at the request of Dr. Jomarie [name].  History of Present Illness:   Kevin [name] was last seen by Kevin [name] in the office on 02/24/2017 and Dr. Elberta [name] on 03/16/2017.  He had a recent admission the [facility] in July of 2018 for acute on chronic systolic heart failure in the setting of medication noncompliance. He diuresed 9.7 Liters at that admission. Dr. Elberta [name] saw him for evaluation of ICD placement as [name] outpatient. On 8/21 he denied symptoms of palpitations, chest pain, SOB, orthopnea, PND, claudication, dizziness, presyncope, syncope, bleeding of any other neurological symptoms.  He complained of extremity edema that would get worse as the day went on but otherwise was doing well. He reports being compliant with medications and low salt diet since discharge. Dry weight 209.  The plan at his most recent visit was to have him on Toprol XL, Hydralazine and BiDil (20/37.5), repeat echocardiogram and to discuss defibrillator at next visit. He is on Eliquis for CHADS2VASC score of 3. He has not been on ACE/ARB because of low blood pressure which has been improving. Baseline creatinine 1.2-1.4.   He presented to the ER this admission for lower extremity swelling and shortness of breath. In the ER his BNP was 1339 and chest xray with improving CHF and cardiomegaly with a new right lung density pleural effusion vs PNA.  He dropped his sats into the 87% with ambulation in  the ER. He was started on lasix as well as abx with the concern for PNA (WBC normal and afebrile). Patient denies having symptoms consistent with PNA and has a hx of asthma.  Past Medical History:  Diagnosis Date  . Asthma   . Chronic systolic CHF (congestive heart failure) (HCC)    a. 06/2015 Echo: EF 25-30%, mod-sev MR, PASP . b. 08/2016: echo showing EF of 20-25% with diffuse HK  . Cocaine use   . Essential hypertension   . Longstanding persistent atrial fibrillation (HCC)    a. Initially dx 06/2015, paroxysmal at that time. b. Recurrent in Feb 2018 and beyond.  . Moderate mitral regurgitation    a. 06/2015 Echo: Mod-Sev MR. b. 08/2016: echo showing moderate MR.   Marland Kitchen NICM (nonischemic cardiomyopathy) (HCC)    a. 06/2015 Echo: EF 25-30%, normal cors by cath - ? Tachy-mediated;    . Non-obstructive CAD    a. 06/2015 Cath: LM nl, LAD 80m, LCX nl, RCA nl, EF 25-30%.  . Tobacco use     Past Surgical History:  Procedure Laterality Date  . CARDIAC CATHETERIZATION N/A 07/26/2015   Procedure: Left Heart Cath and Coronary Angiography;  Surgeon: Kevin Crafts, MD;  Location: [facility] INVASIVE CV LAB;  Service: Cardiovascular;  Laterality: N/A;     Inpatient Medications: Scheduled Meds: . apixaban  5 mg Oral BID  . furosemide  80 mg Intravenous Q12H  . metoprolol succinate  50 mg Oral Daily  . potassium chloride  20 mEq Oral BID  . sodium chloride flush  3 mL Intravenous  Q12H   Continuous Infusions: . sodium chloride     PRN Meds: sodium chloride, acetaminophen, albuterol, metoprolol tartrate, ondansetron (ZOFRAN) IV, sodium chloride flush  Allergies:   No Known Allergies  Social History:   Social History   Social History  . Marital status: Single    Spouse name: N/A  . Number of children: N/A  . Years of education: N/A   Occupational History  .  [employer]   Social History Main Topics  . Smoking status: Former Smoker    Packs/day: 0.50    Years: 40.00     Types: Cigarettes  . Smokeless tobacco: Never Used     Comment: Quit in 2014  . Alcohol use 0.0 oz/week     Comment: 2-3 drinks per week.  . Drug use: Yes     Comment: Not currently. Quit in 2014. 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  . Sexual activity: No   Other Topics Concern  . Not on file   Social History Narrative  . No narrative on file    Family History:   Family History  Problem Relation Age of Onset  . Hypertension Father      ROS:  Please see the history of present illness.  ROS  All other ROS reviewed and negative.     Physical Exam/Data:   Vitals:   04/05/17 0834 04/05/17 1002 04/05/17 1003 04/05/17 1004  BP: 104/83 109/85    Pulse: (!) 118 (!) 192 (!) 110 (!) 106  Resp:      Temp:      TempSrc:      SpO2: 97%     Weight:      Height:        Intake/Output Summary (Last 24 hours) at 04/05/17 1030 Last data filed at 04/05/17 1011  Gross per 24 hour  Intake             1070 ml  Output             3550 ml  Net            -2480 ml   Filed Weights   04/04/17 1455  Weight: 232 lb 12.9 oz (105.6 kg)   Body mass index is 30.71 kg/m.  General:  Well nourished, well developed, in no acute distress HEENT: normal Lymph: no adenopathy Neck: no JVD Endocrine:  No thryomegaly Vascular: No carotid bruits; FA pulses 2+ bilaterally without bruits  Cardiac:  irreg irreg, tachcyardia Lungs:  Crackles to lung bases, increased effort of breathing. Abd: soft, nontender, no hepatomegaly  Ext: 2+ LE edema Musculoskeletal:  No deformities, BUE and BLE strength normal and equal Skin: warm and dry  Neuro:  CNs 2-12 intact, no focal abnormalities noted Psych:  Normal affect   EKG:  The EKG was personally reviewed and demonstrates:  *Atrial fibrillation HR 127 Telemetry:  Telemetry was personally reviewed and demonstrates:  Atrial fibrillation with variable rates.  Relevant CV Studies: 09/16/2016 ECHOCARDIOGRAM  Study Conclusions  - Left ventricle: The cavity size was  mildly dilated. Wall   thickness was normal. Systolic function was severely reduced. The   estimated ejection fraction was in the range of 20% to 25%.   Diffuse hypokinesis. Indeterminant diastolic function (atrial   fibrillation). - Aortic valve: There was no stenosis. - Mitral valve: There was moderate regurgitation. - Left atrium: The atrium was severely dilated. - Right ventricle: The cavity size was normal. Systolic function   was mildly to moderately reduced. - Right atrium:  The atrium was moderately dilated. - Tricuspid valve: Peak RV-RA gradient (S): 23 mm Hg. - Pulmonary arteries: PA peak pressure: 38 mm Hg (S). - Systemic veins: IVC measured 3.3 cm with < 50% respirophasic   variation, suggesting RA pressure 15 mmHg.  Impressions:  - The patient was in atrial fibrillation. Mildly dilated LV with EF   20-25%, diffuse hypokinesis. Normal RV size with mild to   moderately decreased systolic function. Moderate mitral   regurgitation, probably functional. Mild pulmonary hypertension.   Dilated IVC suggestive of elevated RV filling pressure.   Laboratory Data:  Chemistry Recent Labs Lab 04/04/17 0915 04/04/17 1355 04/05/17 0145  NA 141 141 142  K 3.8 3.8 3.7  CL 105 104 102  CO2 29 29 32  GLUCOSE 108* 88 87  BUN CREATININE 1.07 1.03 1.25*  CALCIUM 9.1 8.9 8.9  GFRNONAA >60 >60 60*  GFRAA >60 >60 >60  ANIONGAP No results for input(s): PROT, ALBUMIN, AST, ALT, ALKPHOS, BILITOT in the last 168 hours. Hematology Recent Labs Lab 04/04/17 0915 04/04/17 1355  WBC 8.0 8.2  RBC 5.42 5.36  HGB 14.3 14.3  HCT 43.4 43.3  MCV 80.1 80.8  MCH 26.4 26.7  MCHC 32.9 33.0  RDW 14.5 14.6  PLT 192 193   Cardiac Enzymes Recent Labs Lab 04/04/17 1355 04/04/17 2024 04/05/17 0145  TROPONINI <0.03 <0.03 <0.03    Recent Labs Lab 04/04/17 0924  TROPIPOC 0.01    BNP Recent Labs Lab 04/04/17 0915 04/04/17 1355  BNP 1,193.3* 1,339.9*      DDimer No results for input(s): DDIMER in the last 168 hours.  Radiology/Studies:  Dg Chest 2 View  Result Date: 04/05/2017 CLINICAL DATA:  History of asthma -COPD, CHF, atrial fibrillation, former smoker. The patient is reporting shortness of breath. EXAM: CHEST  2 VIEW COMPARISON:  Chest x-ray of April 04, 2017 FINDINGS: Do left lung is well-expanded and clear. On the right there remains hazy increased density at the lung base. The cardiac silhouette remains enlarged the pulmonary vascularity engorged. The trachea is midline. The bony thorax exhibits no acute abnormality. IMPRESSION: Improving CHF.  Improving right basilar atelectasis or pneumonia. Electronically Signed   By: David  Swaziland M.D.   On: 04/05/2017 08:10   Dg Chest 2 View  Result Date: 04/04/2017 CLINICAL DATA:  Increased sob since yesterday, states hx of asthma, no other chest complaints EXAM: CHEST  2 VIEW COMPARISON:  Chest x-rays dated 02/04/2017 and 07/23/2015. FINDINGS: New dense opacity at the right lung base, highly suspicious for pneumonia, probable small associated pleural effusion. Left lung is clear. Cardiomegaly appears stable. Central pulmonary vascular congestion is present, similar to multiple prior exams, suggesting chronic CHF. No pneumothorax seen. No acute or suspicious osseous finding. IMPRESSION: 1. New dense opacity at the right lung base, highly suspicious for pneumonia with associated small pleural effusion. Atelectasis/airspace collapse would be a consideration if afebrile. 2. Cardiomegaly.  Probable mild chronic CHF. Electronically Signed   By: Bary Richard M.D.   On: 04/04/2017 09:19    Assessment and Plan:   1. Acute on chronic systolic CHF exacerbation: He has not had s/sx consistent with pneumonia. He has seen Dr. Elberta [name] for known low EF, plan was to repeat echo end of October beginning of November on maximum HF therapy and reevaluate need for ICD placement. Pt was non compliant last admission but  says he has been taking his medications but was eating  out some because he is tired when he gets home at night. -- HOME MEDS: Lasix 40 mg BID PO, Bidil (20/37.5) and Toprol 100 mg daily. -- Will continue Lasix 80 mg IV BID and Toprol 50 mg PO -- diuresed 3.35 L overnight and is neg neg 2.4L. -- creatinine slightly bumped this am at 1.25 - will continue to follow with diuresis -- He has not been on ACE I/ARB in the past due to soft BPs and currently BIDIL on hold as well. -- TSH is normal.   2. Persistent atrial fibrillation: Currently on Toprol-XL and Eliquis. Variable rates with a lot of tachycardia. He usually takes 100 mg Toprol XL at home and is only written for 50 mg here because he has had a low BP reading. -- This patients CHA2DS2-VASc Score and unadjusted Ischemic Stroke Rate (% per year) is equal to 2.2 % stroke rate/year from a score of 3 (HF, nonobstructive CAD and HTN).  He will continue on Eliquis.  -- As HR is poorly controlled, will add IV Amio gtt to try to get HR better controlled.  -- Continue Toprol as BP tolerates  3. CKD: creatinine was 1.07 on admission has bumped to 1.25 today -- continue to follow  4.  Nonobstructive ASCAD - 40% mLAD at cath 06/2015 -- denies angina  5.  HTN - BP has been soft  Signed, Dorthula Matas, PA-C  04/05/2017   Patient seen and independently examined with Marlon Pel, PA. We discussed all aspects of the encounter. I agree with the assessment and plan as stated above with some modifications in the note.  Patient well known to our service with history of chronic systolic CHF and nonischemic DCM with EF 20-25% by echo 08/2016 and nonobstructive CAD by cath 2016.  Followed in clinic and trying to tighten HF control prior to reassessing EF by echo to determine need for AICD.  He states he has been compliant with his meds and got a salt substitute but is still eating out a lot after work because he says that he is too tired to cook.  Admitted  with increased SOB and LE edema and found to have elevated BNP, CHF on cxray and signs of CHF on exam.  Started on IV lasix with good diuresis.  HR poorly controlled in afib and soft BP limits titration of BB. Exam showed decreased BS at bases, heart irregularly irregular and tachy, 2+ LE edema.  Cannot use CCB in setting of LV dysfunction.  Will start Amio IV gtt for rate control.  If cannot get rate controlled may need to consider TEE/DCCV although he has been in afib since at least 08/2016.  Will continue IV diuretics and follow renal function closely.  Signed: Armanda Magic, MD [facility] 04/05/2017

## 2017-04-06 DIAGNOSIS — N179 Acute kidney failure, unspecified: Secondary | ICD-10-CM

## 2017-04-06 DIAGNOSIS — J449 Chronic obstructive pulmonary disease, unspecified: Secondary | ICD-10-CM

## 2017-04-06 DIAGNOSIS — I5023 Acute on chronic systolic (congestive) heart failure: Secondary | ICD-10-CM

## 2017-04-06 LAB — BASIC METABOLIC PANEL
Anion gap: 10 (ref 5–15)
BUN: 21 mg/dL — ABNORMAL HIGH (ref 6–20)
CHLORIDE: 99 mmol/L — AB (ref 101–111)
CO2: 32 mmol/L (ref 22–32)
CREATININE: 1.4 mg/dL — AB (ref 0.61–1.24)
Calcium: 9.2 mg/dL (ref 8.9–10.3)
GFR calc non Af Amer: 52 mL/min — ABNORMAL LOW (ref >60)
Glucose, Bld: 85 mg/dL (ref 65–99)
POTASSIUM: 3.8 mmol/L (ref 3.5–5.1)
SODIUM: 141 mmol/L (ref 135–145)

## 2017-04-06 MED ORDER — ALUM & MAG HYDROXIDE-SIMETH 200-200-20 MG/5ML PO SUSP
15.0000 mL | Freq: Four times a day (QID) | ORAL | Status: DC | PRN
  Administered 2017-04-06: 15 mL via ORAL
  Filled 2017-04-06: qty 30

## 2017-04-06 NOTE — Progress Notes (Signed)
Progress Note  Patient Name: Kevin [name] Date of Encounter: 04/06/2017  Primary Cardiologist: Dr. [name] Electrophysiologist: Dr. Elberta Fortis   Subjective   Feels better overall. Dyspnea is improving but still with mild orthopnea and sleeping with head of bed elevated. No palpitations or chest pain.   Inpatient Medications    Scheduled Meds: . apixaban  5 mg Oral BID  . furosemide  80 mg Intravenous Q12H  . metoprolol succinate  50 mg Oral Daily  . potassium chloride  20 mEq Oral BID  . sodium chloride flush  3 mL Intravenous Q12H   Continuous Infusions: . sodium chloride     PRN Meds: sodium chloride, acetaminophen, albuterol, metoprolol tartrate, sodium chloride flush   Vital Signs    Vitals:   04/05/17 1840 04/05/17 2157 04/06/17 0234 04/06/17 0612  BP:  114/89  113/88  Pulse:  60 (!) 103 (!) 102  Resp:  16  16  Temp:  98.3 F (36.8 C)  98 F (36.7 C)  TempSrc:  Oral  Oral  SpO2:  96%  96%  Weight: 227 lb 11.8 oz (103.3 kg)   223 lb 5.2 oz (101.3 kg)  Height:        Intake/Output Summary (Last 24 hours) at 04/06/17 0816 Last data filed at 04/06/17 9147  Gross per 24 hour  Intake          1658.55 ml  Output             2651 ml  Net          -992.45 ml   Filed Weights   04/04/17 1455 04/05/17 1840 04/06/17 0612  Weight: 232 lb 12.9 oz (105.6 kg) 227 lb 11.8 oz (103.3 kg) 223 lb 5.2 oz (101.3 kg)    Telemetry    Atrial fibrillation with Vrates in the 90s-low 100s - Personally Reviewed  ECG    Atrial fibrillation  - Personally Reviewed  Physical Exam   GEN: No acute distress.   Neck: mild JVD Cardiac: irreguarlly irregular, mildly tachy rate, no murmurs, rubs, or gallops.  Respiratory: decreased BS at the bases with faint bibasilar rales. GI: obese, Soft, nontender, non-distended  MS: 2+ bilateral LEE; No deformity. Neuro:  Nonfocal  Psych: Normal affect   Labs    Chemistry Recent Labs Lab 04/04/17 1355 04/05/17 0145 04/06/17 0440  NA  141 142 141  K 3.8 3.7 3.8  CL 104 102 99*  CO2 29 32 32  GLUCOSE 88 87 85  BUN 15 19 21*  CREATININE 1.03 1.25* 1.40*  CALCIUM 8.9 8.9 9.2  GFRNONAA >60 60* 52*  GFRAA >60 >60 >60  ANIONGAP Hematology Recent Labs Lab 04/04/17 0915 04/04/17 1355  WBC 8.0 8.2  RBC 5.42 5.36  HGB 14.3 14.3  HCT 43.4 43.3  MCV 80.1 80.8  MCH 26.4 26.7  MCHC 32.9 33.0  RDW 14.5 14.6  PLT 192 193    Cardiac Enzymes Recent Labs Lab 04/04/17 1355 04/04/17 2024 04/05/17 0145  TROPONINI <0.03 <0.03 <0.03    Recent Labs Lab 04/04/17 0924  TROPIPOC 0.01     BNP Recent Labs Lab 04/04/17 0915 04/04/17 1355  BNP 1,193.3* 1,339.9*     DDimer No results for input(s): DDIMER in the last 168 hours.   Radiology    Dg Chest 2 View  Result Date: 04/05/2017 CLINICAL DATA:  History of asthma -COPD, CHF, atrial fibrillation, former smoker. The patient is reporting shortness of breath.  EXAM: CHEST  2 VIEW COMPARISON:  Chest x-ray of April 04, 2017 FINDINGS: Do left lung is well-expanded and clear. On the right there remains hazy increased density at the lung base. The cardiac silhouette remains enlarged the pulmonary vascularity engorged. The trachea is midline. The bony thorax exhibits no acute abnormality. IMPRESSION: Improving CHF.  Improving right basilar atelectasis or pneumonia. Electronically Signed   By: David  Swaziland M.D.   On: 04/05/2017 08:10   Dg Chest 2 View  Result Date: 04/04/2017 CLINICAL DATA:  Increased sob since yesterday, states hx of asthma, no other chest complaints EXAM: CHEST  2 VIEW COMPARISON:  Chest x-rays dated 02/04/2017 and 07/23/2015. FINDINGS: New dense opacity at the right lung base, highly suspicious for pneumonia, probable small associated pleural effusion. Left lung is clear. Cardiomegaly appears stable. Central pulmonary vascular congestion is present, similar to multiple prior exams, suggesting chronic CHF. No pneumothorax seen. No acute or  suspicious osseous finding. IMPRESSION: 1. New dense opacity at the right lung base, highly suspicious for pneumonia with associated small pleural effusion. Atelectasis/airspace collapse would be a consideration if afebrile. 2. Cardiomegaly.  Probable mild chronic CHF. Electronically Signed   By: Bary Richard M.D.   On: 04/04/2017 09:19    Cardiac Studies   09/16/2016 ECHOCARDIOGRAM  Study Conclusions  - Left ventricle: The cavity size was mildly dilated. Wall thickness was normal. Systolic function was severely reduced. The estimated ejection fraction was in the range of 20% to 25%. Diffuse hypokinesis. Indeterminant diastolic function (atrial fibrillation). - Aortic valve: There was no stenosis. - Mitral valve: There was moderate regurgitation. - Left atrium: The atrium was severely dilated. - Right ventricle: The cavity size was normal. Systolic function was mildly to moderately reduced. - Right atrium: The atrium was moderately dilated. - Tricuspid valve: Peak RV-RA gradient (S): 23 mm Hg. - Pulmonary arteries: PA peak pressure: 38 mm Hg (S). - Systemic veins: IVC measured 3.3 cm with < 50% respirophasic variation, suggesting RA pressure 15 mmHg.  Impressions:  - The patient was in atrial fibrillation. Mildly dilated LV with EF 20-25%, diffuse hypokinesis. Normal RV size with mild to moderately decreased systolic function. Moderate mitral regurgitation, probably functional. Mild pulmonary hypertension. Dilated IVC suggestive of elevated RV filling pressure.  Patient Profile     Kevin [name] is a 61 y.o. male with a hx of chronic systolic HF with an EF 20-25%, nonischemic cardiomyopathy, persistent atrial fibrillation on Eliquis, HTN, HLD, nonobstructive CAD by cath in 2016 and substance abuse, admitted for acute on chronic systolic HF and afib w/ RVR.  Assessment & Plan    1. Acute on Chronic Systolic CHF: in the setting of NICM w/ EF of 20-25%,  dietary indiscretion with sodium and uncontrolled Afib. Admit BNP markedly elevated at 1,193. CXR also c/w CHF. Started on IV Lasix. Diuresing well. -2.66 L out yesterday. Net I/Os -4.1 L total since admit. Weight is down from 232 lb >>223 lb. Slight bump in SCr from 1.25>>1.40. BUN up from 19>>21. K stable at 3.8. Pt notes gradual symptomatic improvement however remains volume overloaded with 2+ bilateral LEE and faint bibasilar rales on exam. Continue IV Lasix for diuresis. Monitor BP, SCr and electrolytes. Continue daily weights, strict I/Os and low sodium diet.   2. Nonischemic Cardiomyopathy: EF 20-25%. LHC in 2016 showed mild nonobstructive CAD, most notably in the LAD, 40% disease. Continue BB therapy with Metoprolol succinate. Soft BP limits addition of ACE/ARB, nitrates and hydralazine at this time. He is  being followed by EP. Plans are for repeat 2D echo in several months to reassess LVEF to determine need for AICD.    3. Persistent Atrial Fibrillation: Pt was placed on amiodarone drip yesterday for better rate control, however he developed dizziness and nausea and BP was recorded at 76/58 thus amiodarone drip was discontinued (see nursing note from 04/05/2017). He remains on PO metoprolol. Vrates in the 90s-low 100s.  Given inabiltiy to tolerate amiodarone and difficulties increasing metoprolol given soft BP, we will need to consider TEE/DDCV this admission. Continue Eliquis for a/c. Monitor electrolytes in the setting of IV diuretics. K is WNL today.   4. HTN: soft but stable. Monitor closely in the setting IV diuresis.   5. HLD: last lipid panel on file is from 2016. LDL was 72 mg/dL at that time. He has eaten this am. Recheck FLP in the am.   For questions or updates, please contact [facility] Please consult [email or web address] for contact info under Cardiology/STEMI.      Signed, Robbie Lis, PA-C  04/06/2017, 8:16 AM

## 2017-04-06 NOTE — Progress Notes (Signed)
PROGRESS NOTE    Kevin [name]  YQI:[id number] DOB: 08-Aug-1955 DOA: 04/04/2017 PCP: [name], FNP  Brief Narrative: This is a 61 year old male with history of nonischemic cardiomyopathy, EF of 20-25%, persistent atrial fibrillation on Apixaban, hypertension, dyslipidemia, history of substance abuse admitted with dyspnea on exertion, felt to be from decompensated systolic CHF. Improving with diuresis, cards following, plan for Cardioversion as Afib and soft BP limiting diuresis  Assessment & Plan:     Acute on chronic systolic heart failure (HCC) -EF 20-25%, has NICM -Continue IV Lasix  q12, and Toprol at lower dose -Afib and BP limiting adequate diuresis and didn't tolerate IV amio gtt yesterday -Cards following, dry weight was 209 pounds and admission weight was 223 pounds -held BiDil, negative 4.1 L and slight bump in creatinine but still remains vol overloaded -cards planning possible Cardioversion tomorrow -Clinically do not suspect pneumonia, repeat x-ray more suggestive of CHF and atelectasis, no fever or leukocytosis or productive cough-monitor clinically    Paroxysmal atrial fibrillation (HCC) -Mild A. fib with RVR noted unfortunately had to cut down Toprol dose due to soft/low BPs this morning will resume home dose tomorrow as blood pressure tolerates -Started on amiodarone per cards today -May be a poor long-term amiodarone candidate due to COPD -Continue Apixaban    COPD (chronic obstructive pulmonary disease) (HCC) -Stable, nebs when necessary   Mild AKI -Baseline creatinine normal -Suspect cardiorenal and secondary to diuresis -Continue IV Lasix, stopped bidil, monitor closely  DVT prophylaxis:apixaban Code Status: Full Code Family Communication: none at bedside Disposition Plan: Home pending improvement  Consultants:  Cardiology   Subjective: -no new complaints, feels ok now, breathing continues to improve  Objective: Vitals:   04/05/17 1840  04/05/17 2157 04/06/17 0234 04/06/17 0612  BP:  114/89  113/88  Pulse:  60 (!) 103 (!) 102  Resp:  16  16  Temp:  98.3 F (36.8 C)  98 F (36.7 C)  TempSrc:  Oral  Oral  SpO2:  96%  96%  Weight: 103.3 kg (227 lb 11.8 oz)   101.3 kg (223 lb 5.2 oz)  Height:        Intake/Output Summary (Last 24 hours) at 04/06/17 1337 Last data filed at 04/06/17 1321  Gross per 24 hour  Intake           328.55 ml  Output             5276 ml  Net         -4947.45 ml   Filed Weights   04/04/17 1455 04/05/17 1840 04/06/17 0612  Weight: 105.6 kg (232 lb 12.9 oz) 103.3 kg (227 lb 11.8 oz) 101.3 kg (223 lb 5.2 oz)    Examination:  Gen: Awake, Alert, Oriented X 3, no distress HEENT: PERRLA, Neck supple, no JVD Lungs: fine basilar crackles CVS: RRR,No Gallops,Rubs or new Murmurs Abd: soft, Non tender, non distended, BS present Extremities: 1-2plus edema Skin: no new rashes  Data Reviewed:   CBC:  Recent Labs Lab 04/04/17 0915 04/04/17 1355  WBC 8.0 8.2  NEUTROABS  --  4.3  HGB 14.3 14.3  HCT 43.4 43.3  MCV 80.1 80.8  PLT 192 193   Basic Metabolic Panel:  Recent Labs Lab 04/04/17 0915 04/04/17 1355 04/05/17 0145 04/06/17 0440  NA 141 141 142 141  K 3.8 3.8 3.7 3.8  CL 105 104 102 99*  CO2 29 29 32 32  GLUCOSE 108* 88 87 85  BUN 16 15 19  21*  CREATININE 1.07 1.03 1.25* 1.40*  CALCIUM 9.1 8.9 8.9 9.2  MG  --  1.5*  --   --    GFR: Estimated Creatinine Clearance: 67.6 mL/min (A) (by C-G formula based on SCr of 1.4 mg/dL (H)). Liver Function Tests: No results for input(s): AST, ALT, ALKPHOS, BILITOT, PROT, ALBUMIN in the last 168 hours. No results for input(s): LIPASE, AMYLASE in the last 168 hours. No results for input(s): AMMONIA in the last 168 hours. Coagulation Profile: No results for input(s): INR, PROTIME in the last 168 hours. Cardiac Enzymes:  Recent Labs Lab 04/04/17 1355 04/04/17 2024 04/05/17 0145  TROPONINI <0.03 <0.03 <0.03   BNP (last 3  results) No results for input(s): PROBNP in the last 8760 hours. HbA1C: No results for input(s): HGBA1C in the last 72 hours. CBG:  Recent Labs Lab 04/05/17 0703  GLUCAP 88   Lipid Profile: No results for input(s): CHOL, HDL, LDLCALC, TRIG, CHOLHDL, LDLDIRECT in the last 72 hours. Thyroid Function Tests:  Recent Labs  04/04/17 1355  TSH 1.415   Anemia Panel: No results for input(s): VITAMINB12, FOLATE, FERRITIN, TIBC, IRON, RETICCTPCT in the last 72 hours. Urine analysis:    Component Value Date/Time   COLORURINE YELLOW 02/04/2017 0958   APPEARANCEUR CLEAR 02/04/2017 0958   LABSPEC 1.014 02/04/2017 0958   PHURINE 5.0 02/04/2017 0958   GLUCOSEU NEGATIVE 02/04/2017 0958   HGBUR NEGATIVE 02/04/2017 0958   BILIRUBINUR NEGATIVE 02/04/2017 0958   KETONESUR NEGATIVE 02/04/2017 0958   PROTEINUR 100 (A) 02/04/2017 0958   NITRITE NEGATIVE 02/04/2017 0958   LEUKOCYTESUR NEGATIVE 02/04/2017 0958   Sepsis Labs: (procalcitonin:4,lacticidven:4)  ) Recent Results (from the past 240 hour(s))  Blood culture (routine x 2)     Status: None (Preliminary result)   Collection Time: 04/04/17 10:56 AM  Result Value Ref Range Status   Specimen Description BLOOD RIGHT ANTECUBITAL  Final   Special Requests   Final    BOTTLES DRAWN AEROBIC AND ANAEROBIC Blood Culture adequate volume   Culture   Final    NO GROWTH 2 DAYS Performed at [facility], [address], [place], [place] [address]    Report Status PENDING  Incomplete  Blood culture (routine x 2)     Status: None (Preliminary result)   Collection Time: 04/04/17 10:58 AM  Result Value Ref Range Status   Specimen Description BLOOD LEFT ANTECUBITAL  Final   Special Requests   Final    BOTTLES DRAWN AEROBIC AND ANAEROBIC Blood Culture adequate volume   Culture   Final    NO GROWTH 2 DAYS Performed at [facility], [address] [place], [place] [address]    Report Status PENDING  Incomplete          Radiology Studies: Dg Chest 2 View  Result Date: 04/05/2017 CLINICAL DATA:  History of asthma -COPD, CHF, atrial fibrillation, former smoker. The patient is reporting shortness of breath. EXAM: CHEST  2 VIEW COMPARISON:  Chest x-ray of April 04, 2017 FINDINGS: Do left lung is well-expanded and clear. On the right there remains hazy increased density at the lung base. The cardiac silhouette remains enlarged the pulmonary vascularity engorged. The trachea is midline. The bony thorax exhibits no acute abnormality. IMPRESSION: Improving CHF.  Improving right basilar atelectasis or pneumonia. Electronically Signed   By: David  [place] M.D.   On: 04/05/2017 08:10        Scheduled Meds: . apixaban  5 mg Oral BID  .  furosemide  80 mg Intravenous Q12H  . metoprolol succinate  50 mg Oral Daily  . potassium chloride  20 mEq Oral BID  . sodium chloride flush  3 mL Intravenous Q12H   Continuous Infusions: . sodium chloride       LOS: 2 days    Time spent:    Zannie Cove, MD [facility] Pager [phone]  If 7PM-7AM, please contact night-coverage [email or web address] Password [email or web address] 04/06/2017, 1:37 PM

## 2017-04-07 DIAGNOSIS — I4819 Other persistent atrial fibrillation: Secondary | ICD-10-CM

## 2017-04-07 DIAGNOSIS — I481 Persistent atrial fibrillation: Secondary | ICD-10-CM

## 2017-04-07 LAB — BASIC METABOLIC PANEL
Anion gap: 11 (ref 5–15)
BUN: 22 mg/dL — AB (ref 6–20)
CHLORIDE: 94 mmol/L — AB (ref 101–111)
CO2: 34 mmol/L — AB (ref 22–32)
CREATININE: 1.29 mg/dL — AB (ref 0.61–1.24)
Calcium: 9.4 mg/dL (ref 8.9–10.3)
GFR calc Af Amer: >60 mL/min (ref >60)
GFR calc non Af Amer: 57 mL/min — ABNORMAL LOW (ref >60)
GLUCOSE: 92 mg/dL (ref 65–99)
POTASSIUM: 3.7 mmol/L (ref 3.5–5.1)
Sodium: 139 mmol/L (ref 135–145)

## 2017-04-07 LAB — LIPID PANEL
CHOLESTEROL: 113 mg/dL (ref 0–200)
HDL: 26 mg/dL — AB (ref >40)
LDL Cholesterol: 69 mg/dL (ref 0–99)
TRIGLYCERIDES: 91 mg/dL (ref <150)
Total CHOL/HDL Ratio: 4.3 RATIO
VLDL: 18 mg/dL (ref 0–40)

## 2017-04-07 NOTE — Progress Notes (Signed)
PROGRESS NOTE    Kevin [name]  QPR:[id number] DOB: 1955/09/03 DOA: 04/04/2017 PCP: [name], FNP  Brief Narrative: This is a 61 year old male with history of nonischemic cardiomyopathy, EF of 20-25%, persistent atrial fibrillation on Apixaban, hypertension, dyslipidemia, history of substance abuse admitted with dyspnea on exertion, felt to be from decompensated systolic CHF. Improving with diuresis, cards following, plan for Cardioversion as Afib and soft BP limiting diuresis  Assessment & Plan:     Acute on chronic systolic heart failure (HCC) -EF 20-25%, has NICM -Continue IV Lasix 80mg  q12, and Toprol at lower dose -Afib and BP limiting adequate diuresis and didn't tolerate IV amio gtt .  -Cards following, dry weight was 209 pounds and admission weight was 223 pounds -cards planning possible Cardioversion tomorrow -    Paroxysmal atrial fibrillation (HCC) -Mild A. fib with RVR noted unfortunately had to cut down Toprol dose due to soft/low BP.  -didn't tolerated amiodarone.  -May be a poor long-term amiodarone candidate due to COPD -Continue Apixaban Cardioversion tomorrow,     COPD (chronic obstructive pulmonary disease) (HCC) -Stable, nebs when necessary   Mild AKI -Baseline creatinine normal -Suspect cardiorenal and secondary to diuresis -Continue IV Lasix, stopped bidil, monitor closely  DVT prophylaxis:apixaban Code Status: Full Code Family Communication: none at bedside Disposition Plan: Home pending improvement  Consultants:  Cardiology   Subjective: Denies dyspnea. No chest pain   Objective: Vitals:   04/07/17 0515 04/07/17 0548 04/07/17 0914 04/07/17 0923  BP: 96/82  97/84   Pulse: (!) 108  (!) 57 (!) 107  Resp: 18     Temp: 97.9 F (36.6 C)     TempSrc: Oral     SpO2: 95%     Weight:  99.8 kg (220 lb 0.3 oz)    Height:        Intake/Output Summary (Last 24 hours) at 04/07/17 1006 Last data filed at 04/07/17 0930  Gross per 24 hour    Intake              603 ml  Output             3300 ml  Net            -2697 ml   Filed Weights   04/05/17 1840 04/06/17 0612 04/07/17 0548  Weight: 103.3 kg (227 lb 11.8 oz) 101.3 kg (223 lb 5.2 oz) 99.8 kg (220 lb 0.3 oz)    Examination:  Gen: No acute distress.  HEENT: no JVD Lungs: bilateral air movement, bilateral crackles.  CVS: S 1, S 2 RRR LDJ:TTSV, NT, ND Extremities: plus 1 edema.  Skin: no new rashes  Data Reviewed:   CBC:  Recent Labs Lab 04/04/17 0915 04/04/17 1355  WBC 8.0 8.2  NEUTROABS  --  4.3  HGB 14.3 14.3  HCT 43.4 43.3  MCV 80.1 80.8  PLT 192 193   Basic Metabolic Panel:  Recent Labs Lab 04/04/17 0915 04/04/17 1355 04/05/17 0145 04/06/17 0440 04/07/17 0628  NA 141 141 142 141 139  K 3.8 3.8 3.7 3.8 3.7  CL 105 104 102 99* 94*  CO2 29 29 32 32 34*  GLUCOSE 108* 88 87 85 92  BUN 16 15 19  21* 22*  CREATININE 1.07 1.03 1.25* 1.40* 1.29*  CALCIUM 9.1 8.9 8.9 9.2 9.4  MG  --  1.5*  --   --   --    GFR: Estimated Creatinine Clearance: 72.9 mL/min (A) (by C-G formula based on SCr  of 1.29 mg/dL (H)). Liver Function Tests: No results for input(s): AST, ALT, ALKPHOS, BILITOT, PROT, ALBUMIN in the last 168 hours. No results for input(s): LIPASE, AMYLASE in the last 168 hours. No results for input(s): AMMONIA in the last 168 hours. Coagulation Profile: No results for input(s): INR, PROTIME in the last 168 hours. Cardiac Enzymes:  Recent Labs Lab 04/04/17 1355 04/04/17 2024 04/05/17 0145  TROPONINI <0.03 <0.03 <0.03   BNP (last 3 results) No results for input(s): PROBNP in the last 8760 hours. HbA1C: No results for input(s): HGBA1C in the last 72 hours. CBG:  Recent Labs Lab 04/05/17 0703  GLUCAP 88   Lipid Profile: No results for input(s): CHOL, HDL, LDLCALC, TRIG, CHOLHDL, LDLDIRECT in the last 72 hours. Thyroid Function Tests:  Recent Labs  04/04/17 1355  TSH 1.415   Anemia Panel: No results for input(s):  VITAMINB12, FOLATE, FERRITIN, TIBC, IRON, RETICCTPCT in the last 72 hours. Urine analysis:    Component Value Date/Time   COLORURINE YELLOW 02/04/2017 0958   APPEARANCEUR CLEAR 02/04/2017 0958   LABSPEC 1.014 02/04/2017 0958   PHURINE 5.0 02/04/2017 0958   GLUCOSEU NEGATIVE 02/04/2017 0958   HGBUR NEGATIVE 02/04/2017 0958   BILIRUBINUR NEGATIVE 02/04/2017 0958   KETONESUR NEGATIVE 02/04/2017 0958   PROTEINUR 100 (A) 02/04/2017 0958   NITRITE NEGATIVE 02/04/2017 0958   LEUKOCYTESUR NEGATIVE 02/04/2017 0958   Sepsis Labs: (procalcitonin:4,lacticidven:4)  ) Recent Results (from the past 240 hour(s))  Blood culture (routine x 2)     Status: None (Preliminary result)   Collection Time: 04/04/17 10:56 AM  Result Value Ref Range Status   Specimen Description BLOOD RIGHT ANTECUBITAL  Final   Special Requests   Final    BOTTLES DRAWN AEROBIC AND ANAEROBIC Blood Culture adequate volume   Culture   Final    NO GROWTH 2 DAYS Performed at [facility], [address] [place], [place] [address]    Report Status PENDING  Incomplete  Blood culture (routine x 2)     Status: None (Preliminary result)   Collection Time: 04/04/17 10:58 AM  Result Value Ref Range Status   Specimen Description BLOOD LEFT ANTECUBITAL  Final   Special Requests   Final    BOTTLES DRAWN AEROBIC AND ANAEROBIC Blood Culture adequate volume   Culture   Final    NO GROWTH 2 DAYS Performed at [facility], [address], [place], [place] [address]    Report Status PENDING  Incomplete         Radiology Studies: No results found.      Scheduled Meds: . apixaban  5 mg Oral BID  . furosemide  80 mg Intravenous Q12H  . metoprolol succinate  50 mg Oral Daily  . potassium chloride  20 mEq Oral BID  . sodium chloride flush  3 mL Intravenous Q12H   Continuous Infusions: . sodium chloride       LOS: 3 days    Time spent:    Ashton Sabine, md [facility] Pager  [phone]  If 7PM-7AM, please contact night-coverage [email or web address] Password [email or web address] 04/07/2017, 10:06 AM

## 2017-04-07 NOTE — Progress Notes (Signed)
Progress Note  Patient Name: Daden Monjaraz Date of Encounter: 04/07/2017  Primary Cardiologist:  Dr. [name]  Subjective   Feeling well this morning. STill in poorly controlled atrial fibrillation,   On board for TEE DCCV tomorrow.    Inpatient Medications    Scheduled Meds: . apixaban  5 mg Oral BID  . furosemide  80 mg Intravenous Q12H  . metoprolol succinate  50 mg Oral Daily  . potassium chloride  20 mEq Oral BID  . sodium chloride flush  3 mL Intravenous Q12H   Continuous Infusions: . sodium chloride     PRN Meds: sodium chloride, acetaminophen, albuterol, alum & mag hydroxide-simeth, metoprolol tartrate, sodium chloride flush   Vital Signs    Vitals:   04/07/17 0515 04/07/17 0548 04/07/17 0914 04/07/17 0923  BP: 96/82  97/84   Pulse: (!) 108  (!) 57 (!) 107  Resp: 18     Temp: 97.9 F (36.6 C)     TempSrc: Oral     SpO2: 95%     Weight:  220 lb 0.3 oz (99.8 kg)    Height:        Intake/Output Summary (Last 24 hours) at 04/07/17 1147 Last data filed at 04/07/17 0930  Gross per 24 hour  Intake              603 ml  Output             3300 ml  Net            -2697 ml   Filed Weights   04/05/17 1840 04/06/17 0612 04/07/17 0548  Weight: 227 lb 11.8 oz (103.3 kg) 223 lb 5.2 oz (101.3 kg) 220 lb 0.3 oz (99.8 kg)    Telemetry    Atrial fibrillation with PVCs - Personally Reviewed   Physical Exam   GEN: Well nourished, well developed HEENT: normal  Neck: no JVD, carotid bruits, or masses Cardiac: irreg irreg, no murmurs, rubs, or gallops,no edema. Intact distal pulses bilaterally.  Respiratory: clear to auscultation bilaterally, normal work of breathing GI: soft, nontender, nondistended, + BS MS: no deformity or atrophy  Skin: warm and dry, no rash Neuro: Alert and Oriented x 3, Strength and sensation are intact Psych:   Full affect  Labs    Chemistry Recent Labs Lab 04/05/17 0145 04/06/17 0440 04/07/17 0628  NA 142 141 139  K 3.7 3.8  3.7  CL 102 99* 94*  CO2 32 32 34*  GLUCOSE 87 85 92  BUN 19 21* 22*  CREATININE 1.25* 1.40* 1.29*  CALCIUM 8.9 9.2 9.4  GFRNONAA 60* 52* 57*  GFRAA >60 >60 >60  ANIONGAP 8 10 11      Hematology Recent Labs Lab 04/04/17 0915 04/04/17 1355  WBC 8.0 8.2  RBC 5.42 5.36  HGB 14.3 14.3  HCT 43.4 43.3  MCV 80.1 80.8  MCH 26.4 26.7  MCHC 32.9 33.0  RDW 14.5 14.6  PLT 192 193    Cardiac Enzymes Recent Labs Lab 04/04/17 1355 04/04/17 2024 04/05/17 0145  TROPONINI <0.03 <0.03 <0.03    Recent Labs Lab 04/04/17 0924  TROPIPOC 0.01     BNP Recent Labs Lab 04/04/17 0915 04/04/17 1355  BNP 1,193.3* 1,339.9*     DDimer No results for input(s): DDIMER in the last 168 hours.   Radiology    No results found.  Cardiac Studies   09/16/2016 ECHOCARDIOGRAM  Study Conclusions  - Left ventricle: The cavity size was mildly dilated. Wall  thickness was normal. Systolic function was severely reduced. The estimated ejection fraction was in the range of 20% to 25%. Diffuse hypokinesis. Indeterminant diastolic function (atrial fibrillation). - Aortic valve: There was no stenosis. - Mitral valve: There was moderate regurgitation. - Left atrium: The atrium was severely dilated. - Right ventricle: The cavity size was normal. Systolic function was mildly to moderately reduced. - Right atrium: The atrium was moderately dilated. - Tricuspid valve: Peak RV-RA gradient (S): 23 mm Hg. - Pulmonary arteries: PA peak pressure: 38 mm Hg (S). - Systemic veins: IVC measured 3.3 cm with < 50% respirophasic variation, suggesting RA pressure 15 mmHg.  Impressions:  - The patient was in atrial fibrillation. Mildly dilated LV with EF 20-25%, diffuse hypokinesis. Normal RV size with mild to moderately decreased systolic function. Moderate mitral regurgitation, probably functional. Mild pulmonary hypertension. Dilated IVC suggestive of elevated RV filling  pressure.  Patient Profile    Harlee Lindsayis a 61 y.o.malewith a hx of chronic systolic HFwith an EF 20-25%, nonischemic cardiomyopathy, persistent atrial fibrillation on Eliquis, HTN, HLD, nonobstructive CAD by cath in 2016 and substance abuse,admitted for acute on chronic systolic HF and afib w/ RVR.   Assessment & Plan   Acute on chronic systolic CHF: Diuresing well.  -- 4 liter output yesterday. Net loss of 8.2L -- creatinine is stable.  NICM: EF 20-25%. LHC in 2016 showed mild nonobstructive CAD, most notably in the LAD, 40% disease. Continue BB therapy with Metoprolol succinate. Soft BP limits addition of ACE/ARB, nitrates and hydralazine at this time. He is being followed by EP. Plans are for repeat 2D echo in several months to reassess LVEF to determine need for AICD.    Persistent atrial fibrillation: Still in atrial fibrillation. Failed Amiodarone drip due to drop in BP. He is on as high dose Metoprolol as his BP can tolerate. On Eliuqis for a/c. He is on the schedule for TEE/DCCV tomorrow. Electrolytes are stable. NPO after midnight  HTN: BP is controlled.    Suan Halter, PA-C  04/07/2017, 11:47 AM

## 2017-04-08 LAB — BASIC METABOLIC PANEL
Anion gap: 10 (ref 5–15)
BUN: 27 mg/dL — AB (ref 6–20)
CO2: 34 mmol/L — ABNORMAL HIGH (ref 22–32)
CREATININE: 1.55 mg/dL — AB (ref 0.61–1.24)
Calcium: 9.1 mg/dL (ref 8.9–10.3)
Chloride: 97 mmol/L — ABNORMAL LOW (ref 101–111)
GFR calc Af Amer: 53 mL/min — ABNORMAL LOW (ref >60)
GFR, EST NON AFRICAN AMERICAN: 46 mL/min — AB (ref >60)
Glucose, Bld: 89 mg/dL (ref 65–99)
POTASSIUM: 3.9 mmol/L (ref 3.5–5.1)
SODIUM: 141 mmol/L (ref 135–145)

## 2017-04-08 MED ORDER — MAGNESIUM SULFATE 2 GM/50ML IV SOLN
2.0000 g | Freq: Once | INTRAVENOUS | Status: AC
  Administered 2017-04-08: 2 g via INTRAVENOUS
  Filled 2017-04-08: qty 50

## 2017-04-08 MED ORDER — FUROSEMIDE 40 MG PO TABS
40.0000 mg | ORAL_TABLET | Freq: Two times a day (BID) | ORAL | Status: DC
  Administered 2017-04-08 – 2017-04-10 (×4): 40 mg via ORAL
  Filled 2017-04-08 (×4): qty 1

## 2017-04-08 NOTE — Progress Notes (Signed)
Progress Note  Patient Name: Kevin [name] Date of Encounter: 04/08/2017  Primary Cardiologist:  Dr. [name]  Subjective   Patient still in Atrial fibrillation this AM. Rates still variable. He know that due to busy schedule earliest we could get him over to [facility] for the procedure is at 12:00pm 9/14/ with Dr. Tenny Craw.  He is otherwise feeling well and in good spirits this AM.   Inpatient Medications    Scheduled Meds: . apixaban  5 mg Oral BID  . furosemide  80 mg Intravenous Q12H  . metoprolol succinate  50 mg Oral Daily  . potassium chloride  20 mEq Oral BID  . sodium chloride flush  3 mL Intravenous Q12H   Continuous Infusions: . sodium chloride     PRN Meds: sodium chloride, acetaminophen, albuterol, alum & mag hydroxide-simeth, metoprolol tartrate, sodium chloride flush   Vital Signs    Vitals:   04/07/17 0923 04/07/17 1344 04/07/17 2207 04/08/17 0641  BP:  108/83 107/80 105/80  Pulse: (!) 107 (!) 111 (!) 117 79  Resp:  Temp:  97.8 F (36.6 C) 98.6 F (37 C) 97.7 F (36.5 C)  TempSrc:  Oral Oral Oral  SpO2:  99% 100% 95%  Weight:    210 lb 15.7 oz (95.7 kg)  Height:        Intake/Output Summary (Last 24 hours) at 04/08/17 0818 Last data filed at 04/07/17 1345  Gross per 24 hour  Intake              480 ml  Output             1100 ml  Net             -620 ml   Filed Weights   04/06/17 0612 04/07/17 0548 04/08/17 0641  Weight: 223 lb 5.2 oz (101.3 kg) 220 lb 0.3 oz (99.8 kg) 210 lb 15.7 oz (95.7 kg)    Telemetry    Atrial fibrillation with mostly rate control and some RVR. - Personally Reviewed   Physical Exam   GEN: Well nourished, well developed HEENT: normal  Neck: no JVD, carotid bruits, or masses Cardiac: irreg irreg. no murmurs, rubs, or gallops,no edema. Intact distal pulses bilaterally.  Respiratory: clear to auscultation bilaterally, normal work of breathing GI: soft, nontender, nondistended, + BS MS: no deformity or atrophy    Skin: warm and dry, no rash Neuro: Alert and Oriented x 3, Strength and sensation are intact Psych:   Full affect  Labs    Chemistry Recent Labs Lab 04/06/17 0440 04/07/17 0628 04/08/17 0526  NA 141 139 141  K 3.8 3.7 3.9  CL 99* 94* 97*  CO2 32 34* 34*  GLUCOSE 85 92 89  BUN 21* 22* 27*  CREATININE 1.40* 1.29* 1.55*  CALCIUM 9.2 9.4 9.1  GFRNONAA 52* 57* 46*  GFRAA >60 >60 53*  ANIONGAP Hematology Recent Labs Lab 04/04/17 0915 04/04/17 1355  WBC 8.0 8.2  RBC 5.42 5.36  HGB 14.3 14.3  HCT 43.4 43.3  MCV 80.1 80.8  MCH 26.4 26.7  MCHC 32.9 33.0  RDW 14.5 14.6  PLT 192 193    Cardiac Enzymes Recent Labs Lab 04/04/17 1355 04/04/17 2024 04/05/17 0145  TROPONINI <0.03 <0.03 <0.03    Recent Labs Lab 04/04/17 0924  TROPIPOC 0.01     BNP Recent Labs Lab 04/04/17 0915 04/04/17 1355  BNP 1,193.3* 1,339.9*  DDimer No results for input(s): DDIMER in the last 168 hours.   Radiology    No results found.  Cardiac Studies   09/16/2016 ECHOCARDIOGRAM  Study Conclusions  - Left ventricle: The cavity size was mildly dilated. Wall thickness was normal. Systolic function was severely reduced. The estimated ejection fraction was in the range of 20% to 25%. Diffuse hypokinesis. Indeterminant diastolic function (atrial fibrillation). - Aortic valve: There was no stenosis. - Mitral valve: There was moderate regurgitation. - Left atrium: The atrium was severely dilated. - Right ventricle: The cavity size was normal. Systolic function was mildly to moderately reduced. - Right atrium: The atrium was moderately dilated. - Tricuspid valve: Peak RV-RA gradient (S): 23 mm Hg. - Pulmonary arteries: PA peak pressure: 38 mm Hg (S). - Systemic veins: IVC measured 3.3 cm with < 50% respirophasic variation, suggesting RA pressure 15 mmHg.  Impressions:  - The patient was in atrial fibrillation. Mildly dilated LV with  EF 20-25%, diffuse hypokinesis. Normal RV size with mild to moderately decreased systolic function. Moderate mitral regurgitation, probably functional. Mild pulmonary hypertension. Dilated IVC suggestive of elevated RV filling pressure.  Patient Profile     Kevin Lindsayis a 61 y.o.malewith a hx of chronic systolic HFwith an EF 20-25%, nonischemic cardiomyopathy, persistent atrial fibrillation on Eliquis, HTN, HLD, nonobstructive CAD by cath in 2016and substance abuse,admitted for acute on chronic systolic HF and afib w/ RVR.  Assessment & Plan    Acute on chronic systolic CHF: Diuresing well.  -- 1.1 liter output yesterday. Net loss of 7.9 L total -- creatinine has bumped this AM 1.40 >> 1.29 << 1.55 today. -- He still has mild amount of fluid on him but we should consider lowering the dose of Lasix because of creatinine.   NICM: EF 20-25%.LHC in 2016 showed mild nonobstructive CAD, most notably in the LAD, 40% disease.  -- Continue BB therapy with Metoprolol succinate.  -- Soft BP limits addition of ACE/ARB, nitrates and hydralazine at this time.  -- He is being followed by EP. Plans arefor repeat 2D echo in several months to reassess LVEF to determine need for AICD.   Persistent atrial fibrillation: Still in atrial fibrillation  mostly rate controlled. Failed Amiodarone drip due to drop in BP. He is on as high dose Metoprolol as his BP can tolerate. --  On Eliquis for a/c.  -- He is on the schedule for TEE/DCCV 12:00pm with Dr. Tenny Craw on Friday 04/09/2017.  Unable to get him on schedule sooner. -- Electrolytes are stable. NPO after midnight tonight.  HTN: BP is controlled.   Suan Halter, PA-C  04/08/2017, 8:18 AM    Patient seen and independently examined with Marlon Pel, PA. We discussed all aspects of the encounter. I agree with the assessment and plan as stated above.  Patient without complaints this am.  VSS.  Remains in atrial fibrillation  with HR 115 up to at high at 160bpm.  Lungs CTA, heart irregularly irregular with no M/R/B, 1+ edema LE.  Will continue Eliquis for anticoagulation.  BP too soft to titrate BB any further.  He did not tolerate Amio due to severe N/V and hypotension so options limited.  Plan for TEE/DCCV tomorrow.  He is still mildly volume overloaded on exam with LE edema but creatinine has not bumped.  Will change IV lasix to 40mg  BID PO and follow renal function closely. BP too soft to add ACEI/ARB.  Signed: Armanda Magic, MD [facility] 04/08/2017

## 2017-04-08 NOTE — Progress Notes (Signed)
Transport arranged with [facility] for TEE/CV tomorrow at [facility] endo, pick up scheduled for 10am

## 2017-04-08 NOTE — Care Management Note (Signed)
Case Management Note  Patient Details  Name: Kevin [name] MRN: [id number] Date of Birth: 07-15-1956  Subjective/Objective: patient states he follows @ [facility]. Financial counselor already following for medicaid pending.                   Action/Plan:d/c plan home.   Expected Discharge Date:                  Expected Discharge Plan:  Home/Self Care  In-House Referral:     Discharge planning Services  CM Consult  Post Acute Care Choice:    Choice offered to:     DME Arranged:    DME Agency:     HH Arranged:    HH Agency:     Status of Service:  In process, will continue to follow  If discussed at Long Length of Stay Meetings, dates discussed:    Additional Comments:  Lanier Clam, RN 04/08/2017, 12:41 PM

## 2017-04-08 NOTE — Progress Notes (Addendum)
PROGRESS NOTE    Kevin [name]  WUJ:[id number] DOB: 11/29/1955 DOA: 04/04/2017 PCP: [name], FNP    Brief Narrative: This is a 61 year old male with history of nonischemic cardiomyopathy, EF of 20-25%, persistent atrial fibrillation on Apixaban, hypertension, dyslipidemia, history of substance abuse admitted with dyspnea on exertion, felt to be from decompensated systolic CHF. Improving with diuresis, cards following, plan for Cardioversion as Afib and soft BP limiting diuresis  Assessment & Plan:     Acute on chronic systolic heart failure (HCC) -EF 20-25%, has NICM -Toprol at lower dose -Afib and BP limiting adequate diuresis and didn't tolerate IV amio gtt .  -Cards following, dry weight was 209 pounds and admission weight was 223 pounds -cards planning possible Cardioversion 9-14 -lasix to be change to 40 mg IV BID> due to increase cr.  -negative 7 L.     Paroxysmal atrial fibrillation (HCC) -Mild A. fib with RVR noted unfortunately had to cut down Toprol dose due to soft/low BP.  -didn't tolerated amiodarone.  -May be a poor long-term amiodarone candidate due to COPD -Continue Apixaban Cardioversion 9-14    COPD (chronic obstructive pulmonary disease) (HCC) -Stable, nebs when necessary -will benefit from nebulizer machines.    Mild AKI -Baseline creatinine normal -Suspect cardiorenal and secondary to diuresis -Continue IV Lasix, dose to be decrease today, repat labs in am.   Hypomagnesemia; repeat labs. Replete with IV mag   DVT prophylaxis:apixaban Code Status: Full Code Family Communication: none at bedside Disposition Plan: Home pending improvement  Consultants:  Cardiology   Subjective: He is doing ok. Breathing better. He is asking for nebulizer, he feels more relieved with nebulizer machine.   Objective: Vitals:   04/07/17 0923 04/07/17 1344 04/07/17 2207 04/08/17 0641  BP:  108/83 107/80 105/80  Pulse: (!) 107 (!) 111 (!) 117 79  Resp:  Temp:  97.8 F (36.6 C) 98.6 F (37 C) 97.7 F (36.5 C)  TempSrc:  Oral Oral Oral  SpO2:  99% 100% 95%  Weight:    95.7 kg (210 lb 15.7 oz)  Height:        Intake/Output Summary (Last 24 hours) at 04/08/17 1452 Last data filed at 04/08/17 1209  Gross per 24 hour  Intake              240 ml  Output              250 ml  Net              -10 ml   Filed Weights   04/06/17 0612 04/07/17 0548 04/08/17 0641  Weight: 101.3 kg (223 lb 5.2 oz) 99.8 kg (220 lb 0.3 oz) 95.7 kg (210 lb 15.7 oz)    Examination:  Gen: No Acute distress.  HEENT: no JVD Lungs: Bilateral air movement, few crackles.  CVS: S 1, S 2 RRR NFA:OZHY, NT, ND Extremities: plus 1 edema Skin: no new rashes  Data Reviewed:   CBC:  Recent Labs Lab 04/04/17 0915 04/04/17 1355  WBC 8.0 8.2  NEUTROABS  --  4.3  HGB 14.3 14.3  HCT 43.4 43.3  MCV 80.1 80.8  PLT 192 193   Basic Metabolic Panel:  Recent Labs Lab 04/04/17 1355 04/05/17 0145 04/06/17 0440 04/07/17 0628 04/08/17 0526  NA 141 142 141 139 141  K 3.8 3.7 3.8 3.7 3.9  CL 104 102 99* 94* 97*  CO2 29 32 32 34* 34*  GLUCOSE 88 87 85  92 89  BUN 15 19 21* 22* 27*  CREATININE 1.03 1.25* 1.40* 1.29* 1.55*  CALCIUM 8.9 8.9 9.2 9.4 9.1  MG 1.5*  --   --   --   --    GFR: Estimated Creatinine Clearance: 55.1 mL/min (A) (by C-G formula based on SCr of 1.55 mg/dL (H)). Liver Function Tests: No results for input(s): AST, ALT, ALKPHOS, BILITOT, PROT, ALBUMIN in the last 168 hours. No results for input(s): LIPASE, AMYLASE in the last 168 hours. No results for input(s): AMMONIA in the last 168 hours. Coagulation Profile: No results for input(s): INR, PROTIME in the last 168 hours. Cardiac Enzymes:  Recent Labs Lab 04/04/17 1355 04/04/17 2024 04/05/17 0145  TROPONINI <0.03 <0.03 <0.03   BNP (last 3 results) No results for input(s): PROBNP in the last 8760 hours. HbA1C: No results for input(s): HGBA1C in the last 72  hours. CBG:  Recent Labs Lab 04/05/17 0703  GLUCAP 88   Lipid Profile:  Recent Labs  04/07/17 0628  CHOL 113  HDL 26*  LDLCALC 69  TRIG 91  CHOLHDL 4.3   Thyroid Function Tests: No results for input(s): TSH, T4TOTAL, FREET4, T3FREE, THYROIDAB in the last 72 hours. Anemia Panel: No results for input(s): VITAMINB12, FOLATE, FERRITIN, TIBC, IRON, RETICCTPCT in the last 72 hours. Urine analysis:    Component Value Date/Time   COLORURINE YELLOW 02/04/2017 0958   APPEARANCEUR CLEAR 02/04/2017 0958   LABSPEC 1.014 02/04/2017 0958   PHURINE 5.0 02/04/2017 0958   GLUCOSEU NEGATIVE 02/04/2017 0958   HGBUR NEGATIVE 02/04/2017 0958   BILIRUBINUR NEGATIVE 02/04/2017 0958   KETONESUR NEGATIVE 02/04/2017 0958   PROTEINUR 100 (A) 02/04/2017 0958   NITRITE NEGATIVE 02/04/2017 0958   LEUKOCYTESUR NEGATIVE 02/04/2017 0958   Sepsis Labs: @LABRCNTIP (procalcitonin:4,lacticidven:4)  ) Recent Results (from the past 240 hour(s))  Blood culture (routine x 2)     Status: None (Preliminary result)   Collection Time: 04/04/17 10:56 AM  Result Value Ref Range Status   Specimen Description BLOOD RIGHT ANTECUBITAL  Final   Special Requests   Final    BOTTLES DRAWN AEROBIC AND ANAEROBIC Blood Culture adequate volume   Culture   Final    NO GROWTH 4 DAYS Performed at [facility], [address] [place], [place] [address]    Report Status PENDING  Incomplete  Blood culture (routine x 2)     Status: None (Preliminary result)   Collection Time: 04/04/17 10:58 AM  Result Value Ref Range Status   Specimen Description BLOOD LEFT ANTECUBITAL  Final   Special Requests   Final    BOTTLES DRAWN AEROBIC AND ANAEROBIC Blood Culture adequate volume   Culture   Final    NO GROWTH 4 DAYS Performed at [facility], [address], [place], [place] [address]    Report Status PENDING  Incomplete         Radiology Studies: No results found.      Scheduled Meds: . apixaban  5  mg Oral BID  . furosemide  40 mg Oral BID  . metoprolol succinate  50 mg Oral Daily  . potassium chloride  20 mEq Oral BID  . sodium chloride flush  3 mL Intravenous Q12H   Continuous Infusions: . sodium chloride       LOS: 4 days    Time spent:    Cadyn Rodger, md [facility] Pager [phone]  If 7PM-7AM, please contact night-coverage [email or web address] Password [email or web address] 04/08/2017, 2:52 PM

## 2017-04-08 NOTE — Care Management Note (Signed)
Case Management Note  Patient Details  Name: Kevin [name] MRN: [id number] Date of Birth: 06-07-1956  Subjective/Objective: Ordered for neb machine-[facility] dme rep Clydie Braun aware. No insurance-Patient works & may not qualify for [employer] has all info & they will discuss payment w/patient.                   Action/Plan:d/c plan home w/neb machine   Expected Discharge Date:                  Expected Discharge Plan:  Home/Self Care  In-House Referral:     Discharge planning Services  CM Consult  Post Acute Care Choice:    Choice offered to:  Patient  DME Arranged:  Nebulizer machine DME Agency:     HH Arranged:    HH Agency:     Status of Service:  In process, will continue to follow  If discussed at Long Length of Stay Meetings, dates discussed:    Additional Comments:  Lanier Clam, RN 04/08/2017, 4:01 PM

## 2017-04-08 NOTE — Progress Notes (Signed)
Patient scheduled for TEE/CV @ noon @ [facility]. Spoke with patients nurse and informed her to set up carelink for arrival @ 1030 for a noon procedure.

## 2017-04-09 ENCOUNTER — Encounter (HOSPITAL_COMMUNITY): Payer: Self-pay | Admitting: Emergency Medicine

## 2017-04-09 ENCOUNTER — Inpatient Hospital Stay (HOSPITAL_COMMUNITY): Payer: Self-pay

## 2017-04-09 ENCOUNTER — Encounter (HOSPITAL_COMMUNITY)
Admission: EM | Disposition: A | Discharge status: Home / Self Care | Payer: Self-pay | Source: Home / Self Care | Attending: Internal Medicine

## 2017-04-09 ENCOUNTER — Inpatient Hospital Stay (HOSPITAL_COMMUNITY): Payer: Self-pay | Admitting: Anesthesiology

## 2017-04-09 DIAGNOSIS — I4891 Unspecified atrial fibrillation: Secondary | ICD-10-CM

## 2017-04-09 DIAGNOSIS — I340 Nonrheumatic mitral (valve) insufficiency: Secondary | ICD-10-CM

## 2017-04-09 HISTORY — PX: CARDIOVERSION: SHX1299

## 2017-04-09 HISTORY — PX: TEE WITHOUT CARDIOVERSION: SHX5443

## 2017-04-09 LAB — BASIC METABOLIC PANEL
Anion gap: 9 (ref 5–15)
BUN: 28 mg/dL — AB (ref 6–20)
CHLORIDE: 96 mmol/L — AB (ref 101–111)
CO2: 33 mmol/L — ABNORMAL HIGH (ref 22–32)
Calcium: 9.2 mg/dL (ref 8.9–10.3)
Creatinine, Ser: 1.45 mg/dL — ABNORMAL HIGH (ref 0.61–1.24)
GFR calc Af Amer: 58 mL/min — ABNORMAL LOW (ref >60)
GFR calc non Af Amer: 50 mL/min — ABNORMAL LOW (ref >60)
GLUCOSE: 92 mg/dL (ref 65–99)
POTASSIUM: 3.6 mmol/L (ref 3.5–5.1)
Sodium: 138 mmol/L (ref 135–145)

## 2017-04-09 LAB — CULTURE, BLOOD (ROUTINE X 2)
CULTURE: NO GROWTH
Culture: NO GROWTH
SPECIAL REQUESTS: ADEQUATE
SPECIAL REQUESTS: ADEQUATE

## 2017-04-09 LAB — MAGNESIUM: Magnesium: 2.1 mg/dL (ref 1.7–2.4)

## 2017-04-09 SURGERY — ECHOCARDIOGRAM, TRANSESOPHAGEAL
Anesthesia: Monitor Anesthesia Care

## 2017-04-09 MED ORDER — LIDOCAINE HCL (CARDIAC) 20 MG/ML IV SOLN
INTRAVENOUS | Status: DC | PRN
  Administered 2017-04-09: 80 mg via INTRAVENOUS

## 2017-04-09 MED ORDER — PHENYLEPHRINE HCL 10 MG/ML IJ SOLN
INTRAMUSCULAR | Status: DC | PRN
  Administered 2017-04-09: 200 ug via INTRAVENOUS
  Administered 2017-04-09 (×2): 100 ug via INTRAVENOUS

## 2017-04-09 MED ORDER — SODIUM CHLORIDE 0.9 % IV SOLN
INTRAVENOUS | Status: DC
  Administered 2017-04-09: 11:00:00 via INTRAVENOUS

## 2017-04-09 MED ORDER — LIDOCAINE VISCOUS 2 % MT SOLN
OROMUCOSAL | Status: DC | PRN
Start: 2017-04-09 — End: 2017-04-09
  Administered 2017-04-09: 10 mL via OROMUCOSAL

## 2017-04-09 MED ORDER — PROPOFOL 500 MG/50ML IV EMUL
INTRAVENOUS | Status: DC | PRN
  Administered 2017-04-09: 150 ug/kg/min via INTRAVENOUS

## 2017-04-09 MED ORDER — LIDOCAINE VISCOUS 2 % MT SOLN
OROMUCOSAL | Status: AC
  Filled 2017-04-09: qty 15

## 2017-04-09 NOTE — Progress Notes (Signed)
  Echocardiogram Echocardiogram Transesophageal has been performed.  Dhyana Bastone T Alejandro Gamel 04/09/2017, 12:25 PM

## 2017-04-09 NOTE — Interval H&P Note (Signed)
History and Physical Interval Note:  04/09/2017 11:18 AM  Kevin [name]  has presented today for surgery, with the diagnosis of a fib  The various methods of treatment have been discussed with the patient and family. After consideration of risks, benefits and other options for treatment, the patient has consented to  Procedure(s): TRANSESOPHAGEAL ECHOCARDIOGRAM (TEE) (N/A) CARDIOVERSION (N/A) as a surgical intervention .  The patient's history has been reviewed, patient examined, no change in status, stable for surgery.  I have reviewed the patient's chart and labs.  Questions were answered to the patient's satisfaction.     Dietrich Pates

## 2017-04-09 NOTE — Anesthesia Postprocedure Evaluation (Signed)
Anesthesia Post Note  Patient: Kevin [name]  Procedure(s) Performed: Procedure(s) (LRB): TRANSESOPHAGEAL ECHOCARDIOGRAM (TEE) (N/A) CARDIOVERSION (N/A)     Patient location during evaluation: PACU Anesthesia Type: MAC Level of consciousness: awake and alert Pain management: pain level controlled Vital Signs Assessment: post-procedure vital signs reviewed and stable Respiratory status: spontaneous breathing Cardiovascular status: stable Anesthetic complications: no    Last Vitals:  Vitals:   04/09/17 1235 04/09/17 1309  BP: 96/71 109/74  Pulse: 76 82  Resp: 20 18  Temp:  36.4 C  SpO2: 99% 100%    Last Pain:  Vitals:   04/09/17 1309  TempSrc: Oral                 Nolon Nations

## 2017-04-09 NOTE — Transfer of Care (Signed)
Immediate Anesthesia Transfer of Care Note  Patient: Kevin [name]  Procedure(s) Performed: Procedure(s): TRANSESOPHAGEAL ECHOCARDIOGRAM (TEE) (N/A) CARDIOVERSION (N/A)  Patient Location: Endoscopy Unit  Anesthesia Type:MAC  Level of Consciousness: drowsy and patient cooperative  Airway & Oxygen Therapy: Patient Spontanous Breathing and Patient connected to nasal cannula oxygen  Post-op Assessment: Report given to RN and Post -op Vital signs reviewed and stable  Post vital signs: Reviewed and stable  Last Vitals:  Vitals:   04/09/17 0457 04/09/17 1042  BP: (!) 112/94 (!) 124/94  Pulse: (!) 50 (!) 116  Resp: 18 16  Temp: 36.6 C 36.9 C  SpO2: 98% 96%    Last Pain:  Vitals:   04/09/17 1042  TempSrc: Oral         Complications: No apparent anesthesia complications

## 2017-04-09 NOTE — Plan of Care (Signed)
Problem: Physical Regulation: Goal: Ability to maintain clinical measurements within normal limits will improve Outcome: Not Progressing Pt to have cardioversion tomorrow.

## 2017-04-09 NOTE — Anesthesia Preprocedure Evaluation (Addendum)
Anesthesia Evaluation  Patient identified by MRN, date of birth, ID band Patient awake    Reviewed: Allergy & Precautions, NPO status , Patient's Chart, lab work & pertinent test results, reviewed documented beta blocker date and time   Airway Mallampati: II  TM Distance: >3 FB Neck ROM: Full    Dental  (+) Missing   Pulmonary asthma , COPD, former smoker,    Pulmonary exam normal breath sounds clear to auscultation       Cardiovascular hypertension, Pt. on medications and Pt. on home beta blockers + CAD and +CHF  Normal cardiovascular exam Rhythm:Regular Rate:Normal  Echo 08/2016 - Left ventricle: The cavity size was mildly dilated. Wall   thickness was normal. Systolic function was severely reduced. The estimated ejection fraction was in the range of 20% to 25%. Diffuse hypokinesis. Indeterminant diastolic function (atrial fibrillation). - Aortic valve: There was no stenosis. - Mitral valve: There was moderate regurgitation. - Left atrium: The atrium was severely dilated. - Right ventricle: The cavity size was normal. Systolic function was mildly to moderately reduced. - Right atrium: The atrium was moderately dilated. - Tricuspid valve: Peak RV-RA gradient (S): 23 mm Hg. - Pulmonary arteries: PA peak pressure: 38 mm Hg (S). - Systemic veins: IVC measured 3.3 cm with < 50% respirophasic variation, suggesting RA pressure 15 mmHg.  Impressions: - The patient was in atrial fibrillation. Mildly dilated LV with EF 20-25%, diffuse hypokinesis. Normal RV size with mild to moderately decreased systolic function. Moderate mitral regurgitation, probably functional. Mild pulmonary hypertension. Dilated IVC suggestive of elevated RV filling pressure.   Neuro/Psych negative neurological ROS  negative psych ROS   GI/Hepatic negative GI ROS, Neg liver ROS,   Endo/Other  negative endocrine ROS  Renal/GU Renal disease      Musculoskeletal negative musculoskeletal ROS (+)   Abdominal   Peds  Hematology negative hematology ROS (+)   Anesthesia Other Findings   Reproductive/Obstetrics negative OB ROS                            Anesthesia Physical Anesthesia Plan  ASA: IV  Anesthesia Plan: MAC   Post-op Pain Management:    Induction: Intravenous  PONV Risk Score and Plan: 1 and Ondansetron and Propofol infusion  Airway Management Planned:   Additional Equipment:   Intra-op Plan:   Post-operative Plan:   Informed Consent: I have reviewed the patients History and Physical, chart, labs and discussed the procedure including the risks, benefits and alternatives for the proposed anesthesia with the patient or authorized representative who has indicated his/her understanding and acceptance.   Dental advisory given  Plan Discussed with: CRNA  Anesthesia Plan Comments:         Anesthesia Quick Evaluation

## 2017-04-09 NOTE — H&P (View-Only) (Signed)
PROGRESS NOTE    Kevin [name]  MRN:[id number] DOB: 06/26/1956 DOA: 04/04/2017 PCP: Harris, Kimberly S, FNP    Brief Narrative: This is a 61-year-old male with history of nonischemic cardiomyopathy, EF of 20-25%, persistent atrial fibrillation on Apixaban, hypertension, dyslipidemia, history of substance abuse admitted with dyspnea on exertion, felt to be from decompensated systolic CHF. Improving with diuresis, cards following, plan for Cardioversion as Afib and soft BP limiting diuresis  Assessment & Plan:     Acute on chronic systolic heart failure (HCC) -EF 20-25%, has NICM -Toprol at lower dose -Afib and BP limiting adequate diuresis and didn't tolerate IV amio gtt .  -Cards following, dry weight was 209 pounds and admission weight was 223 pounds -cards planning possible Cardioversion 9-14 -lasix to be change to 40 mg IV BID> due to increase cr.  -negative 7 L.     Paroxysmal atrial fibrillation (HCC) -Mild A. fib with RVR noted unfortunately had to cut down Toprol dose due to soft/low BP.  -didn't tolerated amiodarone.  -May be a poor long-term amiodarone candidate due to COPD -Continue Apixaban Cardioversion 9-14    COPD (chronic obstructive pulmonary disease) (HCC) -Stable, nebs when necessary -will benefit from nebulizer machines.    Mild AKI -Baseline creatinine normal -Suspect cardiorenal and secondary to diuresis -Continue IV Lasix, dose to be decrease today, repat labs in am.   Hypomagnesemia; repeat labs. Replete with IV mag   DVT prophylaxis:apixaban Code Status: Full Code Family Communication: none at bedside Disposition Plan: Home pending improvement  Consultants:  Cardiology   Subjective: He is doing ok. Breathing better. He is asking for nebulizer, he feels more relieved with nebulizer machine.   Objective: Vitals:   04/07/17 0923 04/07/17 1344 04/07/17 2207 04/08/17 0641  BP:  108/83 107/80 105/80  Pulse: (!) 107 (!) 111 (!) 117 79  Resp:  18  18 18  Temp:  97.8 F (36.6 C) 98.6 F (37 C) 97.7 F (36.5 C)  TempSrc:  Oral Oral Oral  SpO2:  99% 100% 95%  Weight:    95.7 kg (210 lb 15.7 oz)  Height:        Intake/Output Summary (Last 24 hours) at 04/08/17 1452 Last data filed at 04/08/17 1209  Gross per 24 hour  Intake              240 ml  Output              250 ml  Net              -10 ml   Filed Weights   04/06/17 0612 04/07/17 0548 04/08/17 0641  Weight: 101.3 kg (223 lb 5.2 oz) 99.8 kg (220 lb 0.3 oz) 95.7 kg (210 lb 15.7 oz)    Examination:  Gen: No Acute distress.  HEENT: no JVD Lungs: Bilateral air movement, few crackles.  CVS: S 1, S 2 RRR Abd:Soft, NT, ND Extremities: plus 1 edema Skin: no new rashes  Data Reviewed:   CBC:  Recent Labs Lab 04/04/17 0915 04/04/17 1355  WBC 8.0 8.2  NEUTROABS  --  4.3  HGB 14.3 14.3  HCT 43.4 43.3  MCV 80.1 80.8  PLT 192 193   Basic Metabolic Panel:  Recent Labs Lab 04/04/17 1355 04/05/17 0145 04/06/17 0440 04/07/17 0628 04/08/17 0526  NA 141 142 141 139 141  K 3.8 3.7 3.8 3.7 3.9  CL 104 102 99* 94* 97*  CO2 29 32 32 34* 34*  GLUCOSE 88 87 85   92 89  BUN 15 19 21* 22* 27*  CREATININE 1.03 1.25* 1.40* 1.29* 1.55*  CALCIUM 8.9 8.9 9.2 9.4 9.1  MG 1.5*  --   --   --   --    GFR: Estimated Creatinine Clearance: 55.1 mL/min (A) (by C-G formula based on SCr of 1.55 mg/dL (H)). Liver Function Tests: No results for input(s): AST, ALT, ALKPHOS, BILITOT, PROT, ALBUMIN in the last 168 hours. No results for input(s): LIPASE, AMYLASE in the last 168 hours. No results for input(s): AMMONIA in the last 168 hours. Coagulation Profile: No results for input(s): INR, PROTIME in the last 168 hours. Cardiac Enzymes:  Recent Labs Lab 04/04/17 1355 04/04/17 2024 04/05/17 0145  TROPONINI <0.03 <0.03 <0.03   BNP (last 3 results) No results for input(s): PROBNP in the last 8760 hours. HbA1C: No results for input(s): HGBA1C in the last 72  hours. CBG:  Recent Labs Lab 04/05/17 0703  GLUCAP 88   Lipid Profile:  Recent Labs  04/07/17 0628  CHOL 113  HDL 26*  LDLCALC 69  TRIG 91  CHOLHDL 4.3   Thyroid Function Tests: No results for input(s): TSH, T4TOTAL, FREET4, T3FREE, THYROIDAB in the last 72 hours. Anemia Panel: No results for input(s): VITAMINB12, FOLATE, FERRITIN, TIBC, IRON, RETICCTPCT in the last 72 hours. Urine analysis:    Component Value Date/Time   COLORURINE YELLOW 02/04/2017 0958   APPEARANCEUR CLEAR 02/04/2017 0958   LABSPEC 1.014 02/04/2017 0958   PHURINE 5.0 02/04/2017 0958   GLUCOSEU NEGATIVE 02/04/2017 0958   HGBUR NEGATIVE 02/04/2017 0958   BILIRUBINUR NEGATIVE 02/04/2017 0958   KETONESUR NEGATIVE 02/04/2017 0958   PROTEINUR 100 (A) 02/04/2017 0958   NITRITE NEGATIVE 02/04/2017 0958   LEUKOCYTESUR NEGATIVE 02/04/2017 0958   Sepsis Labs: @LABRCNTIP (procalcitonin:4,lacticidven:4)  ) Recent Results (from the past 240 hour(s))  Blood culture (routine x 2)     Status: None (Preliminary result)   Collection Time: 04/04/17 10:56 AM  Result Value Ref Range Status   Specimen Description BLOOD RIGHT ANTECUBITAL  Final   Special Requests   Final    BOTTLES DRAWN AEROBIC AND ANAEROBIC Blood Culture adequate volume   Culture   Final    NO GROWTH 4 DAYS Performed at [facility], [address] [place], [place] [address]    Report Status PENDING  Incomplete  Blood culture (routine x 2)     Status: None (Preliminary result)   Collection Time: 04/04/17 10:58 AM  Result Value Ref Range Status   Specimen Description BLOOD LEFT ANTECUBITAL  Final   Special Requests   Final    BOTTLES DRAWN AEROBIC AND ANAEROBIC Blood Culture adequate volume   Culture   Final    NO GROWTH 4 DAYS Performed at [facility], [address], [place], [place] [address]    Report Status PENDING  Incomplete         Radiology Studies: No results found.      Scheduled Meds: . apixaban  5  mg Oral BID  . furosemide  40 mg Oral BID  . metoprolol succinate  50 mg Oral Daily  . potassium chloride  20 mEq Oral BID  . sodium chloride flush  3 mL Intravenous Q12H   Continuous Infusions: . sodium chloride       LOS: 4 days    Time spent:    Seward Coran, md [facility] Pager [phone]  If 7PM-7AM, please contact night-coverage [email or web address] Password [email or web address] 04/08/2017, 2:52 PM

## 2017-04-09 NOTE — Op Note (Signed)
  Cardioversion  PT anethetized with propofol by anesthesia WIth pads in AP position pt cardioverted to SR with 200 J syncrhonized biphasic energy Procedure was without complication 12 lead EKG pending

## 2017-04-09 NOTE — Op Note (Signed)
TEE  LA, LAA without masses  Large. TV normal  Mild to moderate TR MV mildy thickened.  Moderate MR AV mildly thickened  Trace AI PV normal LVEF is severely depressed at approximately 20% RVEF is depressed Minimal fixed plaquing in the thoracic aorta.

## 2017-04-09 NOTE — Progress Notes (Signed)
PROGRESS NOTE    Kevin [name]  GBT:[id number] DOB: [birth year] DOA: 04/04/2017 PCP: [name], FNP    Brief Narrative: This is a 61 year old male with history of nonischemic cardiomyopathy, EF of 20-25%, persistent atrial fibrillation on Apixaban, hypertension, dyslipidemia, history of substance abuse admitted with dyspnea on exertion, felt to be from decompensated systolic CHF. Improving with diuresis, cards following, plan for Cardioversion as Afib and soft BP limiting diuresis  Assessment & Plan:     Acute on chronic systolic heart failure (HCC) -EF 20-25%, has NICM -Toprol at lower dose -Afib and BP limiting adequate diuresis and didn't tolerate IV amio gtt .  -Cards following, dry weight was 209 pounds and admission weight was 223 pounds -cards planning possible Cardioversion 9-14 -lasix change to oral.  -negative 7 L.     Paroxysmal atrial fibrillation (HCC) -Mild A. fib with RVR noted unfortunately had to cut down Toprol dose due to soft/low BP.  -didn't tolerated amiodarone.  -May be a poor long-term amiodarone candidate due to COPD -Continue Apixaban Cardioversion 9-14    COPD (chronic obstructive pulmonary disease) (HCC) -Stable, nebs when necessary -will benefit from nebulizer machines.    Mild AKI -Baseline creatinine normal -Suspect cardiorenal and secondary to diuresis Stable. Monitor on oral lasix.   Hypomagnesemia; repeat labs. Replete with IV mag   DVT prophylaxis:apixaban Code Status: Full Code Family Communication: none at bedside Disposition Plan: Home pending improvement  Consultants:  Cardiology   Subjective: He is doing ok. No chest pain   Objective: Vitals:   04/09/17 1220 04/09/17 1230 04/09/17 1235 04/09/17 1309  BP: 111/76 91/67 96/71  109/74  Pulse: 74 73 76 82  Resp: Temp: 97.6 F (36.4 C)   97.6 F (36.4 C)  TempSrc: Oral   Oral  SpO2: 100% 100% 99% 100%  Weight:      Height:        Intake/Output Summary  (Last 24 hours) at 04/09/17 1324 Last data filed at 04/09/17 1207  Gross per 24 hour  Intake              930 ml  Output              300 ml  Net              630 ml   Filed Weights   04/08/17 0641 04/09/17 0457 04/09/17 1042  Weight: 95.7 kg (210 lb 15.7 oz) 94.8 kg (208 lb 15.9 oz) 94.3 kg (208 lb)    Examination:  Gen: NAD HEENT: No JVD Lungs: CTA CVS: S 1, S 2 IRR Abd: Soft, NT, ND Extremities: plus 1 edema Skin: no new rashes  Data Reviewed:   CBC:  Recent Labs Lab 04/04/17 0915 04/04/17 1355  WBC 8.0 8.2  NEUTROABS  --  4.3  HGB 14.3 14.3  HCT 43.4 43.3  MCV 80.1 80.8  PLT 192 193   Basic Metabolic Panel:  Recent Labs Lab 04/04/17 1355 04/05/17 0145 04/06/17 0440 04/07/17 0628 04/08/17 0526 04/09/17 0523  NA 141 142 141 139 141 138  K 3.8 3.7 3.8 3.7 3.9 3.6  CL 104 102 99* 94* 97* 96*  CO2 29 32 32 34* 34* 33*  GLUCOSE 88 87 85 92 89 92  BUN 15 19 21* 22* 27* 28*  CREATININE 1.03 1.25* 1.40* 1.29* 1.55* 1.45*  CALCIUM 8.9 8.9 9.2 9.4 9.1 9.2  MG 1.5*  --   --   --   --  2.1   GFR: Estimated Creatinine Clearance: 58.9 mL/min (A) (by C-G formula based on SCr of 1.45 mg/dL (H)). Liver Function Tests: No results for input(s): AST, ALT, ALKPHOS, BILITOT, PROT, ALBUMIN in the last 168 hours. No results for input(s): LIPASE, AMYLASE in the last 168 hours. No results for input(s): AMMONIA in the last 168 hours. Coagulation Profile: No results for input(s): INR, PROTIME in the last 168 hours. Cardiac Enzymes:  Recent Labs Lab 04/04/17 1355 04/04/17 2024 04/05/17 0145  TROPONINI <0.03 <0.03 <0.03   BNP (last 3 results) No results for input(s): PROBNP in the last 8760 hours. HbA1C: No results for input(s): HGBA1C in the last 72 hours. CBG:  Recent Labs Lab 04/05/17 0703  GLUCAP 88   Lipid Profile:  Recent Labs  04/07/17 0628  CHOL 113  HDL 26*  LDLCALC 69  TRIG 91  CHOLHDL 4.3   Thyroid Function Tests: No results for  input(s): TSH, T4TOTAL, FREET4, T3FREE, THYROIDAB in the last 72 hours. Anemia Panel: No results for input(s): VITAMINB12, FOLATE, FERRITIN, TIBC, IRON, RETICCTPCT in the last 72 hours. Urine analysis:    Component Value Date/Time   COLORURINE YELLOW 02/04/2017 0958   APPEARANCEUR CLEAR 02/04/2017 0958   LABSPEC 1.014 02/04/2017 0958   PHURINE 5.0 02/04/2017 0958   GLUCOSEU NEGATIVE 02/04/2017 0958   HGBUR NEGATIVE 02/04/2017 0958   BILIRUBINUR NEGATIVE 02/04/2017 0958   KETONESUR NEGATIVE 02/04/2017 0958   PROTEINUR 100 (A) 02/04/2017 0958   NITRITE NEGATIVE 02/04/2017 0958   LEUKOCYTESUR NEGATIVE 02/04/2017 0958   Sepsis Labs: @LABRCNTIP (procalcitonin:4,lacticidven:4)  ) Recent Results (from the past 240 hour(s))  Blood culture (routine x 2)     Status: None   Collection Time: 04/04/17 10:56 AM  Result Value Ref Range Status   Specimen Description BLOOD RIGHT ANTECUBITAL  Final   Special Requests   Final    BOTTLES DRAWN AEROBIC AND ANAEROBIC Blood Culture adequate volume   Culture   Final    NO GROWTH 5 DAYS Performed at [facility], [address], [place], [place] [address]    Report Status 04/09/2017 FINAL  Final  Blood culture (routine x 2)     Status: None   Collection Time: 04/04/17 10:58 AM  Result Value Ref Range Status   Specimen Description BLOOD LEFT ANTECUBITAL  Final   Special Requests   Final    BOTTLES DRAWN AEROBIC AND ANAEROBIC Blood Culture adequate volume   Culture   Final    NO GROWTH 5 DAYS Performed at [facility], [address] [place], [place] [address]    Report Status 04/09/2017 FINAL  Final         Radiology Studies: No results found.      Scheduled Meds: . apixaban  5 mg Oral BID  . furosemide  40 mg Oral BID  . metoprolol succinate  50 mg Oral Daily  . potassium chloride  20 mEq Oral BID  . sodium chloride flush  3 mL Intravenous Q12H   Continuous Infusions: . sodium chloride Stopped (04/09/17 1207)      LOS: 5 days    Time spent:    Belkys regalado, md [facility] Pager [phone]  If 7PM-7AM, please contact night-coverage [email or web address] Password [email or web address] 04/09/2017, 1:24 PM

## 2017-04-10 LAB — BASIC METABOLIC PANEL
ANION GAP: 7 (ref 5–15)
BUN: 22 mg/dL — AB (ref 6–20)
CHLORIDE: 101 mmol/L (ref 101–111)
CO2: 30 mmol/L (ref 22–32)
Calcium: 9 mg/dL (ref 8.9–10.3)
Creatinine, Ser: 1.19 mg/dL (ref 0.61–1.24)
GFR calc Af Amer: >60 mL/min (ref >60)
GLUCOSE: 83 mg/dL (ref 65–99)
POTASSIUM: 3.8 mmol/L (ref 3.5–5.1)
SODIUM: 138 mmol/L (ref 135–145)

## 2017-04-10 MED ORDER — APIXABAN 5 MG PO TABS: 5.0000 mg | ORAL_TABLET | Freq: Two times a day (BID) | ORAL | 2 refills | Status: DC

## 2017-04-10 MED ORDER — POTASSIUM CHLORIDE CRYS ER 20 MEQ PO TBCR: 20.0000 meq | EXTENDED_RELEASE_TABLET | Freq: Two times a day (BID) | ORAL | 0 refills | Status: DC

## 2017-04-10 MED ORDER — FUROSEMIDE 40 MG PO TABS: 40.0000 mg | ORAL_TABLET | Freq: Two times a day (BID) | ORAL | 2 refills | Status: DC

## 2017-04-10 MED ORDER — ALBUTEROL SULFATE (2.5 MG/3ML) 0.083% IN NEBU: 2.5000 mg | INHALATION_SOLUTION | Freq: Four times a day (QID) | RESPIRATORY_TRACT | 12 refills | Status: DC | PRN

## 2017-04-10 MED ORDER — METOPROLOL SUCCINATE ER 50 MG PO TB24: 50.0000 mg | ORAL_TABLET | Freq: Every day | ORAL | 0 refills | Status: DC

## 2017-04-10 NOTE — Progress Notes (Signed)
Pt ambulated to the end of the hall and back to room without any distress. Saturation at 97% no complaint of chest pain or SOB. MD made aware.

## 2017-04-10 NOTE — Care Management Note (Signed)
Case Management Note  Patient Details  Name: Kevin [name] MRN: [id number] Date of Birth: [birth year]  Subjective/Objective:      CHF, afib               Action/Plan: Discharge Planning: please see previous NCM notes Spoke to pt and he has neb machine in room from [facility]. Provided pt with goodrx coupons for his medications. States he receive Eliquis through drug company patient assistance program. He goes to [facility]. Explained he can utilize [facility] pharmacy for discounts on his meds.   PCP Joaquin Courts MD  Expected Discharge Date:  04/10/17               Expected Discharge Plan:  Home/Self Care  In-House Referral:  NA  Discharge planning Services  CM Consult  Post Acute Care Choice:  NA Choice offered to:  NA  DME Arranged:  Nebulizer machine DME Agency:  [facility]  HH Arranged:  NA HH Agency:  NA  Status of Service:  Completed, signed off  If discussed at Long Length of Stay Meetings, dates discussed:    Additional Comments:  Elliot Cousin, RN 04/10/2017, 4:29 PM

## 2017-04-10 NOTE — Progress Notes (Addendum)
Progress Note  Patient Name: Kevin [name] Date of Encounter: 04/10/2017   Subjective   No complaints this AM  Inpatient Medications    Scheduled Meds: . apixaban  5 mg Oral BID  . furosemide  40 mg Oral BID  . metoprolol succinate  50 mg Oral Daily  . potassium chloride  20 mEq Oral BID  . sodium chloride flush  3 mL Intravenous Q12H   Continuous Infusions: . sodium chloride Stopped (04/09/17 1207)   PRN Meds: sodium chloride, acetaminophen, albuterol, alum & mag hydroxide-simeth, metoprolol tartrate, sodium chloride flush   Vital Signs    Vitals:   04/09/17 1235 04/09/17 1309 04/09/17 2212 04/10/17 0428  BP: 96/71 109/74 (!) 84/60 110/84  Pulse: 76 82 81 79  Resp: Temp:  97.6 F (36.4 C) 98.4 F (36.9 C) 98.7 F (37.1 C)  TempSrc:  Oral Oral Oral  SpO2: 99% 100% 97% 94%  Weight:    207 lb 10.8 oz (94.2 kg)  Height:        Intake/Output Summary (Last 24 hours) at 04/10/17 0748 Last data filed at 04/10/17 0435  Gross per 24 hour  Intake              400 ml  Output             1230 ml  Net             -830 ml   Filed Weights   04/09/17 0457 04/09/17 1042 04/10/17 0428  Weight: 208 lb 15.9 oz (94.8 kg) 208 lb (94.3 kg) 207 lb 10.8 oz (94.2 kg)    Telemetry    NSR - Personally Reviewed  ECG    n/a  Physical Exam   GEN: No acute distress.   Neck: No JVD Cardiac: RRR, 2/6 systolic murmur at apex, no rubs, or gallops.  Respiratory: Clear to auscultation bilaterally. GI: Soft, nontender, non-distended  MS: No edema; No deformity. Neuro:  Nonfocal  Psych: Normal affect   Labs    Chemistry Recent Labs Lab 04/08/17 0526 04/09/17 0523 04/10/17 0536  NA 141 138 138  K 3.9 3.6 3.8  CL 97* 96* 101  CO2 34* 33* 30  GLUCOSE 89 92 83  BUN 27* 28* 22*  CREATININE 1.55* 1.45* 1.19  CALCIUM 9.1 9.2 9.0  GFRNONAA 46* 50* >60  GFRAA 53* 58* >60  ANIONGAP Hematology Recent Labs Lab 04/04/17 0915 04/04/17 1355  WBC  8.0 8.2  RBC 5.42 5.36  HGB 14.3 14.3  HCT 43.4 43.3  MCV 80.1 80.8  MCH 26.4 26.7  MCHC 32.9 33.0  RDW 14.5 14.6  PLT 192 193    Cardiac Enzymes Recent Labs Lab 04/04/17 1355 04/04/17 2024 04/05/17 0145  TROPONINI <0.03 <0.03 <0.03    Recent Labs Lab 04/04/17 0924  TROPIPOC 0.01     BNP Recent Labs Lab 04/04/17 0915 04/04/17 1355  BNP 1,193.3* 1,339.9*     DDimer No results for input(s): DDIMER in the last 168 hours.   Radiology    No results found.  Cardiac Studies     Patient Profile     Kevin Lindsayis a 61 y.o.malewith a hx of chronic systolic HFwith an EF 20-25%, nonischemic cardiomyopathy, persistent atrial fibrillation on Eliquis, HTN, HLD, nonobstructive CAD by cath in 2016and substance abuse,admitted for acute on chronic systolic HF and afib w/ RVR.  Assessment & Plan    1. Acute on  chronic systolic HF/NICM - 08/2016 echo LVEF 20-25%, mod MR, mild to mod RV dysfunction - negative yesterday, negative 8.3 liters since admission. Labile Cr with diuresis, today at 1.19 the lowest its been over the last several days. He is on lasix 40mg  po bid. - medical therapy with Toprol XL 50. From outpatient notes he has not been on ACE-I/ARB/ARNI or hydral/nitrates due to hypotension - appears overally euvolemic on exam, denies ongoing symptoms  2. Afib - patient failed amio drip due to hypotension - s/p TEE/DCCV yesterday, he remains in NSR this AM - he is on eliquis for stroke prevention  We will ask nursing to ambulate patient, if does well would be ok for discharge from cardiac standpoint.  We will arrange 2 week f/u with Dr [name] or his PA  For questions or updates, please contact [facility] Please consult [email or web address] for contact info under Cardiology/STEMI.      Joanie Coddington, MD  04/10/2017, 7:48 AM

## 2017-04-10 NOTE — Discharge Summary (Signed)
Physician Discharge Summary  Kevin [name] FWY:[id number] DOB: Aug 25, 1955 DOA: 04/04/2017  PCP: [name], FNP  Admit date: 04/04/2017 Discharge date: 04/10/2017  Admitted From; home  Disposition:  Home   Recommendations for Outpatient Follow-up:  1. Follow up with PCP in 1-2 weeks 2. Please obtain BMP/CBC in one week 3. Follow up with cardiology for further care of heart failure.   Home Health: no  Discharge Condition: stable.  CODE STATUS: full code.  Diet recommendation: Heart Healthy  Brief/Interim Summary: This is a 61 year old male with history of nonischemic cardiomyopathy, EF of 20-25%, persistent atrial fibrillation on Apixaban, hypertension, dyslipidemia, history of substance abuse admitted with dyspnea on exertion, felt to be from decompensated systolic CHF. Improving with diuresis, cards following, plan for Cardioversion as Afib and soft BP limiting diuresis  Assessment & Plan:     Acute on chronic systolic heart failure (HCC) -EF 20-25%, has NICM -Toprol at lower dose -Afib and BP limiting adequate diuresis and didn't tolerate IV amio gtt .  -Cards following, dry weight was 209 pounds and admission weight was 223 pounds -cards planning possible Cardioversion 9-14 -lasix change to oral.  -negative 7 L. Plan to discharge today     Paroxysmal atrial fibrillation (HCC) -Mild A. fib with RVR noted unfortunately had to cut down Toprol dose due to soft/low BP.  -didn't tolerated amiodarone.  -May be a poor long-term amiodarone candidate due to COPD -Continue Apixaban Cardioversion 9-14 stable, plan to discharge today     COPD (chronic obstructive pulmonary disease) (HCC) -Stable, nebs when necessary -will benefit from nebulizer machines. He received nebulizer machine.    Mild AKI -Baseline creatinine normal -Suspect cardiorenal and secondary to diuresis Stable. Monitor on oral lasix.   Hypomagnesemia; repeat labs. Replaced.   Discharge Diagnoses:   Principal Problem:   Acute on chronic systolic heart failure (HCC) Active Problems:   Paroxysmal atrial fibrillation (HCC)   COPD (chronic obstructive pulmonary disease) (HCC)   Chronic systolic CHF (congestive heart failure) (HCC)   Coronary artery disease involving native coronary artery of native heart without angina pectoris   AKI (acute kidney injury) (HCC)   CKD (chronic kidney disease), stage II   CHF (congestive heart failure) (HCC)   DCM (dilated cardiomyopathy) (HCC)   Persistent atrial fibrillation [facility])    Discharge Instructions  Discharge Instructions    Diet - low sodium heart healthy    Complete by:  As directed    Increase activity slowly    Complete by:  As directed      Allergies as of 04/10/2017   No Known Allergies     Medication List    STOP taking these medications   isosorbide-hydrALAZINE 20-37.5 MG tablet Commonly known as:  BIDIL     TAKE these medications   albuterol 108 (90 Base) MCG/ACT inhaler Commonly known as:  PROVENTIL HFA;VENTOLIN HFA Inhale 2 puffs into the lungs every 6 (six) hours as needed for wheezing or shortness of breath. What changed:  Another medication with the same name was added. Make sure you understand how and when to take each.   albuterol (2.5 MG/3ML) 0.083% nebulizer solution Commonly known as:  PROVENTIL Take 3 mLs (2.5 mg total) by nebulization every 6 (six) hours as needed for wheezing or shortness of breath. What changed:  You were already taking a medication with the same name, and this prescription was added. Make sure you understand how and when to take each.   apixaban 5 MG Tabs tablet Commonly  known as:  ELIQUIS Take 1 tablet (5 mg total) by mouth 2 (two) times daily.   furosemide 40 MG tablet Commonly known as:  LASIX Take 1 tablet (40 mg total) by mouth 2 (two) times daily.   lansoprazole 30 MG capsule Commonly known as:  PREVACID Take 30 mg by mouth daily as needed (for heartburn/indigestion).    metoprolol succinate 50 MG 24 hr tablet Commonly known as:  TOPROL-XL Take 1 tablet (50 mg total) by mouth daily. Take with or immediately following a meal. What changed:  medication strength  how much to take   potassium chloride SA 20 MEQ tablet Commonly known as:  K-DUR,KLOR-CON Take 1 tablet (20 mEq total) by mouth 2 (two) times daily. What changed:  when to take this            Durable Medical Equipment        Start     Ordered   04/08/17 1457  For home use only DME Nebulizer machine  Once    Question:  Patient needs a nebulizer to treat with the following condition  Answer:  COPD (chronic obstructive pulmonary disease) (HCC)   04/08/17 1456       Discharge Care Instructions        Start     Ordered   04/11/17 0000  metoprolol succinate (TOPROL-XL) 50 MG 24 hr tablet  Daily     04/10/17 0932   04/10/17 0000  albuterol (PROVENTIL) (2.5 MG/3ML) 0.083% nebulizer solution  Every 6 hours PRN     04/10/17 0932   04/10/17 0000  apixaban (ELIQUIS) 5 MG TABS tablet  2 times daily     04/10/17 0932   04/10/17 0000  furosemide (LASIX) 40 MG tablet  2 times daily     04/10/17 0932   04/10/17 0000  potassium chloride SA (K-DUR,KLOR-CON) 20 MEQ tablet  2 times daily     04/10/17 0932   04/10/17 0000  Increase activity slowly     04/10/17 0932   04/10/17 0000  Diet - low sodium heart healthy     04/10/17 0932     Follow-up Information    [facility] - Dme Follow up.   Why:  nebulizer machine. Contact information: [address] [place] [address] [phone]        Chrystie Nose, MD Follow up.   Specialty:  Cardiology Why:  the office will call with date and time, if you have not heard by Tuesday call for appt.  Contact information: [address] [place] [address] [place] [address] [phone]        [name], FNP Follow up in 2 week(s).   Specialty:  Family Medicine Contact information: [address] [place]  [place] [address] [phone]          No Known Allergies  Consultations:  Cardiology    Procedures/Studies: Dg Chest 2 View  Result Date: 04/05/2017 CLINICAL DATA:  History of asthma -COPD, CHF, atrial fibrillation, former smoker. The patient is reporting shortness of breath. EXAM: CHEST  2 VIEW COMPARISON:  Chest x-ray of April 04, 2017 FINDINGS: Do left lung is well-expanded and clear. On the right there remains hazy increased density at the lung base. The cardiac silhouette remains enlarged the pulmonary vascularity engorged. The trachea is midline. The bony thorax exhibits no acute abnormality. IMPRESSION: Improving CHF.  Improving right basilar atelectasis or pneumonia. Electronically Signed   By: David  [place] M.D.   On:  04/05/2017 08:10   Dg Chest 2 View  Result Date: 04/04/2017 CLINICAL DATA:  Increased sob since yesterday, states hx of asthma, no other chest complaints EXAM: CHEST  2 VIEW COMPARISON:  Chest x-rays dated 02/04/2017 and 07/23/2015. FINDINGS: New dense opacity at the right lung base, highly suspicious for pneumonia, probable small associated pleural effusion. Left lung is clear. Cardiomegaly appears stable. Central pulmonary vascular congestion is present, similar to multiple prior exams, suggesting chronic CHF. No pneumothorax seen. No acute or suspicious osseous finding. IMPRESSION: 1. New dense opacity at the right lung base, highly suspicious for pneumonia with associated small pleural effusion. Atelectasis/airspace collapse would be a consideration if afebrile. 2. Cardiomegaly.  Probable mild chronic CHF. Electronically Signed   By: Bary Richard M.D.   On: 04/04/2017 09:19       Subjective: Feeling well, denies dyspnea.   Discharge Exam: Vitals:   04/10/17 0428 04/10/17 0842  BP: 110/84 109/77  Pulse: 79 81  Resp: 15   Temp: 98.7 F (37.1 C) 98 F (36.7 C)  SpO2: 94% 97%   Vitals:   04/09/17 1309 04/09/17 2212 04/10/17 0428 04/10/17 0842  BP: 109/74  (!) 84/60 110/84 109/77  Pulse: 82 81 79 81  Resp: Temp: 97.6 F (36.4 C) 98.4 F (36.9 C) 98.7 F (37.1 C) 98 F (36.7 C)  TempSrc: Oral Oral Oral Oral  SpO2: 100% 97% 94% 97%  Weight:   94.2 kg (207 lb 10.8 oz)   Height:        General: Pt is alert, awake, not in acute distress Cardiovascular: RRR, S1/S2 +, no rubs, no gallops Respiratory: CTA bilaterally, no wheezing, no rhonchi Abdominal: Soft, NT, ND, bowel sounds + Extremities: no edema, no cyanosis    The results of significant diagnostics from this hospitalization (including imaging, microbiology, ancillary and laboratory) are listed below for reference.     Microbiology: Recent Results (from the past 240 hour(s))  Blood culture (routine x 2)     Status: None   Collection Time: 04/04/17 10:56 AM  Result Value Ref Range Status   Specimen Description BLOOD RIGHT ANTECUBITAL  Final   Special Requests   Final    BOTTLES DRAWN AEROBIC AND ANAEROBIC Blood Culture adequate volume   Culture   Final    NO GROWTH 5 DAYS Performed at [facility], [address] [place], [place] [address]    Report Status 04/09/2017 FINAL  Final  Blood culture (routine x 2)     Status: None   Collection Time: 04/04/17 10:58 AM  Result Value Ref Range Status   Specimen Description BLOOD LEFT ANTECUBITAL  Final   Special Requests   Final    BOTTLES DRAWN AEROBIC AND ANAEROBIC Blood Culture adequate volume   Culture   Final    NO GROWTH 5 DAYS Performed at [facility], [address] [place], [place] [address]    Report Status 04/09/2017 FINAL  Final     Labs: BNP (last 3 results)  Recent Labs  02/04/17 0905 04/04/17 0915 04/04/17 1355  BNP 655.0* 1,193.3* 1,339.9*   Basic Metabolic Panel:  Recent Labs Lab 04/04/17 1355  04/06/17 0440 04/07/17 0628 04/08/17 0526 04/09/17 0523 04/10/17 0536  NA 141  < > 141 139 141 138 138  K 3.8  < > 3.8 3.7 3.9 3.6 3.8  CL 104  < > 99* 94* 97* 96* 101  CO2  29  < > 32  34* 34* 33* 30  GLUCOSE 88  < > 85 92 89 92 83  BUN 15  < > 21* 22* 27* 28* 22*  CREATININE 1.03  < > 1.40* 1.29* 1.55* 1.45* 1.19  CALCIUM 8.9  < > 9.2 9.4 9.1 9.2 9.0  MG 1.5*  --   --   --   --  2.1  --   < > = values in this interval not displayed. Liver Function Tests: No results for input(s): AST, ALT, ALKPHOS, BILITOT, PROT, ALBUMIN in the last 168 hours. No results for input(s): LIPASE, AMYLASE in the last 168 hours. No results for input(s): AMMONIA in the last 168 hours. CBC:  Recent Labs Lab 04/04/17 0915 04/04/17 1355  WBC 8.0 8.2  NEUTROABS  --  4.3  HGB 14.3 14.3  HCT 43.4 43.3  MCV 80.1 80.8  PLT 192 193   Cardiac Enzymes:  Recent Labs Lab 04/04/17 1355 04/04/17 2024 04/05/17 0145  TROPONINI <0.03 <0.03 <0.03   BNP: Invalid input(s): POCBNP CBG:  Recent Labs Lab 04/05/17 0703  GLUCAP 88   D-Dimer No results for input(s): DDIMER in the last 72 hours. Hgb A1c No results for input(s): HGBA1C in the last 72 hours. Lipid Profile No results for input(s): CHOL, HDL, LDLCALC, TRIG, CHOLHDL, LDLDIRECT in the last 72 hours. Thyroid function studies No results for input(s): TSH, T4TOTAL, T3FREE, THYROIDAB in the last 72 hours.  Invalid input(s): FREET3 Anemia work up No results for input(s): VITAMINB12, FOLATE, FERRITIN, TIBC, IRON, RETICCTPCT in the last 72 hours. Urinalysis    Component Value Date/Time   COLORURINE YELLOW 02/04/2017 0958   APPEARANCEUR CLEAR 02/04/2017 0958   LABSPEC 1.014 02/04/2017 0958   PHURINE 5.0 02/04/2017 0958   GLUCOSEU NEGATIVE 02/04/2017 0958   HGBUR NEGATIVE 02/04/2017 0958   BILIRUBINUR NEGATIVE 02/04/2017 0958   KETONESUR NEGATIVE 02/04/2017 0958   PROTEINUR 100 (A) 02/04/2017 0958   NITRITE NEGATIVE 02/04/2017 0958   LEUKOCYTESUR NEGATIVE 02/04/2017 0958   Sepsis Labs Invalid input(s): PROCALCITONIN,  WBC,  LACTICIDVEN Microbiology Recent Results (from the past 240 hour(s))  Blood culture (routine  x 2)     Status: None   Collection Time: 04/04/17 10:56 AM  Result Value Ref Range Status   Specimen Description BLOOD RIGHT ANTECUBITAL  Final   Special Requests   Final    BOTTLES DRAWN AEROBIC AND ANAEROBIC Blood Culture adequate volume   Culture   Final    NO GROWTH 5 DAYS Performed at [facility], [address] [place], [place] [address]    Report Status 04/09/2017 FINAL  Final  Blood culture (routine x 2)     Status: None   Collection Time: 04/04/17 10:58 AM  Result Value Ref Range Status   Specimen Description BLOOD LEFT ANTECUBITAL  Final   Special Requests   Final    BOTTLES DRAWN AEROBIC AND ANAEROBIC Blood Culture adequate volume   Culture   Final    NO GROWTH 5 DAYS Performed at [facility], [address], [place], [place] [address]    Report Status 04/09/2017 FINAL  Final     Time coordinating discharge: Over 30 minutes  SIGNED:   Alba Cory, MD  [facility] 04/10/2017, 9:32 AM Pager   If 7PM-7AM, please contact night-coverage [email or web address] Password [email or web address]

## 2017-04-10 NOTE — Progress Notes (Signed)
Patient being discharged to home with son at bedside. VSS, AVS reviewed and IV discontinued. Pt ambulated to son vehicle and denies questions or concerns.

## 2017-04-11 ENCOUNTER — Encounter (HOSPITAL_COMMUNITY): Payer: Self-pay | Admitting: Internal Medicine

## 2017-04-16 ENCOUNTER — Telehealth: Payer: Self-pay | Admitting: Internal Medicine

## 2017-04-16 NOTE — Telephone Encounter (Signed)
Closed Encounter  °

## 2017-05-05 ENCOUNTER — Ambulatory Visit: Payer: Self-pay | Admitting: Physician Assistant

## 2017-05-06 ENCOUNTER — Ambulatory Visit (INDEPENDENT_AMBULATORY_CARE_PROVIDER_SITE_OTHER): Payer: Self-pay | Admitting: Physician Assistant

## 2017-05-06 ENCOUNTER — Encounter: Payer: Self-pay | Admitting: Physician Assistant

## 2017-05-06 VITALS — BP 123/80 | HR 71 | Ht 73.0 in | Wt 218.0 lb

## 2017-05-06 DIAGNOSIS — E785 Hyperlipidemia, unspecified: Secondary | ICD-10-CM

## 2017-05-06 DIAGNOSIS — I10 Essential (primary) hypertension: Secondary | ICD-10-CM

## 2017-05-06 DIAGNOSIS — I5022 Chronic systolic (congestive) heart failure: Secondary | ICD-10-CM

## 2017-05-06 DIAGNOSIS — Z79899 Other long term (current) drug therapy: Secondary | ICD-10-CM

## 2017-05-06 DIAGNOSIS — I480 Paroxysmal atrial fibrillation: Secondary | ICD-10-CM

## 2017-05-06 MED ORDER — LOSARTAN POTASSIUM 25 MG PO TABS: 12.5000 mg | ORAL_TABLET | Freq: Every day | ORAL | 3 refills | Status: DC

## 2017-05-06 MED ORDER — METOPROLOL SUCCINATE ER 50 MG PO TB24: ORAL_TABLET | ORAL | 5 refills | Status: DC

## 2017-05-06 NOTE — Patient Instructions (Addendum)
Medication Instructions:   INCREASE Metoprolol to 100mg  (2x 50mg  tablets) in the morning. Continue the evening dose of 50mg .  START Losartan  12.5mg . This medication comes in a 25mg  tablet as the smallest size. Please cut these pills in half to equal this dose.  Labwork:   Return for labwork in 1 week to reassess your kidney function and electrolytes after medication adjustment.  You may have this drawn in our office, and you do not need an appointment. Our lab is open from 8:00 to 4:30 M-F (closed for lunch from 12:45 to 1:45). You do not need to fast for this test.   Testing/Procedures:  none  Follow-Up:  With Dr. [name] and Dr. Elberta Fortis as scheduled  If you need a refill on your cardiac medications before your next appointment, please call your pharmacy.

## 2017-05-06 NOTE — Progress Notes (Signed)
Cardiology Office Note    Date:  05/08/2017   ID:  Kevin [name], DOB 08/11/1955, MRN [id number]  PCP:  [name], FNP  Cardiologist:  Dr. [name]   Chief Complaint  Patient presents with  . Hospitalization Follow-up    Pt states no Sx. Seen for Dr. [name]    History of Present Illness:  Kevin [name] is a 61 y.o. male with PMH of chronic systolic HF (EF 20-25% by Echo in 06/2015), NICM (cath in 2016 nonobstructive CAD), Paroxysmal atrial fibrillation on eliquis, HTN, HLD, and substance use. He was admitted in July for acute on chronic systolic heart failure in the setting of medication noncompliance. BNP was elevated and he was treated with IV Lasix with removal of 9.7 L. Dry weight on discharge was 209 pounds. He was not started on ACEI/ARB due to borderline hypotension. He was discharged on 40 mg twice a day of Lasix and Toprol-XL 100 mg in a.m. and 50 mg in p.m. During the general cardiology follow-up on 02/24/2017, he remained in atrial fibrillation, however was borderline hypotensive. Toprol-XL switched to metoprolol titrate to 50 mg twice a day. He was seen by Dr. Elberta Fortis on 03/16/2017, metoprolol titrate was switched back to Toprol-XL 100 mg daily. BiDil was added for heart failure therapy. It was planned to obtain a repeat echocardiogram and then consider ICD implantation and a later time. He returned to the hospital on 04/05/2017 with lower extremity edema and increasing shortness of breath. O2 saturation dropped down to 87% in the ED. He was started on IV Lasix as well as antibiotics for concern of possible pneumonia. BiDil was held due to soft blood pressure. Initially there was plan of using amiodarone, however he reacted with severe nausea and vomiting. He eventually underwent TEE cardioversion on 04/09/2017 by Dr. Tenny Craw. left atria was noted to be large. EF depressed about 20%. He cardioverted to sinus rhythm with a single 200 J synchronized biphasic energy. He was eventually  discharged on 50 mg daily Toprol-XL for rate control.  Patient presents today for cardiology office visit. He says he misunderstood the discharge instruction and has been taking 50 mg twice a day of Toprol-XL. I order to offer better rate control, I plan to increase his Toprol-XL to 100 mg in a.m. and 50 mg in p.m. For his LV dysfunction, I will also start on 12.5 mg daily of losartan. He will need a basic metabolic panel in one week. Otherwise he denies any lower extremity edema, orthopnea or PND. He did ask me to contact his insurance to lower Preventil cost, since it is a pulmonary medication, I asked him to check with his PCP   Past Medical History:  Diagnosis Date  . Asthma   . Chronic systolic CHF (congestive heart failure) (HCC)    a. 06/2015 Echo: EF 25-30%, mod-sev MR, PASP . b. 08/2016: echo showing EF of 20-25% with diffuse HK  . Cocaine use   . Essential hypertension   . Longstanding persistent atrial fibrillation (HCC)    a. Initially dx 06/2015, paroxysmal at that time. b. Recurrent in Feb 2018 and beyond.  . Moderate mitral regurgitation    a. 06/2015 Echo: Mod-Sev MR. b. 08/2016: echo showing moderate MR.   Marland Kitchen NICM (nonischemic cardiomyopathy) (HCC)    a. 06/2015 Echo: EF 25-30%, normal cors by cath - ? Tachy-mediated;    . Non-obstructive CAD    a. 06/2015 Cath: LM nl, LAD 60m, LCX nl, RCA nl, EF 25-30%.  Marland Kitchen  Tobacco use     Past Surgical History:  Procedure Laterality Date  . CARDIAC CATHETERIZATION N/A 07/26/2015   Procedure: Left Heart Cath and Coronary Angiography;  Surgeon: Corky Crafts, MD;  Location: [facility] INVASIVE CV LAB;  Service: Cardiovascular;  Laterality: N/A;  . CARDIOVERSION N/A 04/09/2017   Procedure: CARDIOVERSION;  Surgeon: Pricilla Riffle, MD;  Location: [facility] ENDOSCOPY;  Service: Cardiovascular;  Laterality: N/A;  . TEE WITHOUT CARDIOVERSION N/A 04/09/2017   Procedure: TRANSESOPHAGEAL ECHOCARDIOGRAM (TEE);  Surgeon: Pricilla Riffle, MD;  Location: [facility]  ENDOSCOPY;  Service: Cardiovascular;  Laterality: N/A;    Current Medications: Outpatient Medications Prior to Visit  Medication Sig Dispense Refill  . albuterol (PROVENTIL HFA;VENTOLIN HFA) 108 (90 Base) MCG/ACT inhaler Inhale 2 puffs into the lungs every 6 (six) hours as needed for wheezing or shortness of breath. 1 Inhaler 3  . albuterol (PROVENTIL) (2.5 MG/3ML) 0.083% nebulizer solution Take 3 mLs (2.5 mg total) by nebulization every 6 (six) hours as needed for wheezing or shortness of breath. 75 mL 12  . apixaban (ELIQUIS) 5 MG TABS tablet Take 1 tablet (5 mg total) by mouth 2 (two) times daily. 60 tablet 2  . furosemide (LASIX) 40 MG tablet Take 1 tablet (40 mg total) by mouth 2 (two) times daily. 60 tablet 2  . lansoprazole (PREVACID) 30 MG capsule Take 30 mg by mouth daily as needed (for heartburn/indigestion).    . potassium chloride SA (K-DUR,KLOR-CON) 20 MEQ tablet Take 1 tablet (20 mEq total) by mouth 2 (two) times daily. 30 tablet 0  . metoprolol succinate (TOPROL-XL) 50 MG 24 hr tablet Take 1 tablet (50 mg total) by mouth daily. Take with or immediately following a meal. 30 tablet 0   No facility-administered medications prior to visit.      Allergies:   Patient has no known allergies.   Social History   Social History  . Marital status: Single    Spouse name: N/A  . Number of children: N/A  . Years of education: N/A   Occupational History  .  [employer]   Social History Main Topics  . Smoking status: Former Smoker    Packs/day: 0.50    Years: 40.00    Types: Cigarettes  . Smokeless tobacco: Never Used     Comment: Quit in 2014  . Alcohol use 0.0 oz/week     Comment: 2-3 drinks per week.  . Drug use: Yes     Comment: Not currently. Quit in 2014. 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  . Sexual activity: No   Other Topics Concern  . None   Social History Narrative  . None     Family History:  The patient's family history includes Hypertension in his father.    ROS:   Please see the history of present illness.    ROS All other systems reviewed and are negative.   PHYSICAL EXAM:   VS:  BP 123/80   Pulse 71   Ht 6\' 1"  (1.854 m)   Wt 218 lb (98.9 kg)   BMI 28.76 kg/m    GEN: Well nourished, well developed, in no acute distress  HEENT: normal  Neck: no JVD, carotid bruits, or masses Cardiac: RRR; no murmurs, rubs, or gallops,no edema  Respiratory:  clear to auscultation bilaterally, normal work of breathing GI: soft, nontender, nondistended, + BS MS: no deformity or atrophy  Skin: warm and dry, no rash Neuro:  Alert and Oriented x 3, Strength and sensation are intact Psych:  euthymic mood, full affect  Wt Readings from Last 3 Encounters:  05/06/17 218 lb (98.9 kg)  04/10/17 207 lb 10.8 oz (94.2 kg)  03/16/17 221 lb (100.2 kg)      Studies/Labs Reviewed:   EKG:  EKG is ordered today.  The ekg ordered today demonstrates Normal sinus rhythm, T-wave inversion in lateral leads  Recent Labs: 02/04/2017: ALT 29 04/04/2017: B Natriuretic Peptide 1,339.9; Hemoglobin 14.3; Platelets 193; TSH 1.415 04/09/2017: Magnesium 2.1 04/10/2017: BUN 22; Creatinine, Ser 1.19; Potassium 3.8; Sodium 138   Lipid Panel    Component Value Date/Time   CHOL 113 04/07/2017 0628   TRIG 91 04/07/2017 0628   HDL 26 (L) 04/07/2017 0628   CHOLHDL 4.3 04/07/2017 0628   VLDL 18 04/07/2017 0628   LDLCALC 69 04/07/2017 0628    Additional studies/ records that were reviewed today include:    Cath 07/26/2015 Conclusion    Mild nonobstructive CAD, most notably in the LAD.  There is severe left ventricular systolic dysfunction.   Continue aggressive medical therapy to treat his nonischemic cardiomyopathy.      Echo 09/16/2016 LV EF: 20% -   25%  Study Conclusions  - Left ventricle: The cavity size was mildly dilated. Wall   thickness was normal. Systolic function was severely reduced. The   estimated ejection fraction was in the range of 20% to  25%.   Diffuse hypokinesis. Indeterminant diastolic function (atrial   fibrillation). - Aortic valve: There was no stenosis. - Mitral valve: There was moderate regurgitation. - Left atrium: The atrium was severely dilated. - Right ventricle: The cavity size was normal. Systolic function   was mildly to moderately reduced. - Right atrium: The atrium was moderately dilated. - Tricuspid valve: Peak RV-RA gradient (S): 23 mm Hg. - Pulmonary arteries: PA peak pressure: 38 mm Hg (S). - Systemic veins: IVC measured 3.3 cm with < 50% respirophasic   variation, suggesting RA pressure 15 mmHg.  Impressions:  - The patient was in atrial fibrillation. Mildly dilated LV with EF   20-25%, diffuse hypokinesis. Normal RV size with mild to   moderately decreased systolic function. Moderate mitral   regurgitation, probably functional. Mild pulmonary hypertension.   Dilated IVC suggestive of elevated RV filling pressure.     TEE DCCV 04/09/2017 LA, LAA without masses  Large. TV normal  Mild to moderate TR MV mildy thickened.  Moderate MR AV mildly thickened  Trace AI PV normal LVEF is severely depressed at approximately 20% RVEF is depressed Minimal fixed plaquing in the thoracic aorta.   WIth pads in AP position pt cardioverted to SR with 200 J syncrhonized biphasic energy    ASSESSMENT:    1. Paroxysmal atrial fibrillation (HCC)   2. Encounter for long-term (current) use of medications   3. Chronic systolic heart failure (HCC)   4. Essential hypertension   5. Hyperlipidemia, unspecified hyperlipidemia type      PLAN:  In order of problems listed above:  1. Paroxysmal atrial fibrillation status post recent TEE cardioversion. Blood pressure significantly improved after conversion. Will increase rate control by increasing metoprolol to 100 mg in a.m. and 50 mg p.m. Continue on eliquis  2. Chronic systolic heart failure: Previously unable to add ACE inhibitor or ARB due to  hypotension during atrial fibrillation. He was on BiDil as well however later discontinued due to hypotension. Since cardioversion, his blood pressure has significantly improved, we'll add losartan 12.5 mg daily today. Currently on 40 mg twice a day of  Lasix. Will need a basic metabolic panel in one week  3. Hypertension: Blood pressure improved, will increase metoprolol to 100 mg in a.m. and 50 mg in p.m. Also add losartan 12.5 mg daily.  4. Hyperlipidemia: Currently not on statin, despite a history of hyperlipidemia, his cholesterol is actually very well controlled. Last lipid panel 04/07/2017 showed a total cholesterol 113, triglycerides 91, HDL low at 26, LDL 69.    Medication Adjustments/Labs and Tests Ordered: Current medicines are reviewed at length with the patient today.  Concerns regarding medicines are outlined above.  Medication changes, Labs and Tests ordered today are listed in the Patient Instructions below. Patient Instructions  Medication Instructions:   INCREASE Metoprolol to  (2x  tablets) in the morning. Continue the evening dose of .  START Losartan  12.5mg . This medication comes in a  tablet as the smallest size. Please cut these pills in half to equal this dose.  Labwork:   Return for labwork in 1 week to reassess your kidney function and electrolytes after medication adjustment.  You may have this drawn in our office, and you do not need an appointment. Our lab is open from 8:00 to 4:30 M-F (closed for lunch from 12:45 to 1:45). You do not need to fast for this test.   Testing/Procedures:  none  Follow-Up:  With Dr. [name] and Dr. Elberta Fortis as scheduled  If you need a refill on your cardiac medications before your next appointment, please call your pharmacy.      Ramond Dial, [place]  05/08/2017 8:16 AM    [facility] [address] [place], [place], [place]  [address] Phone: [phone]; Fax: [phone]

## 2017-05-08 ENCOUNTER — Encounter: Payer: Self-pay | Admitting: Physician Assistant

## 2017-05-10 NOTE — Addendum Note (Signed)
Addended by: Ladell Heads on: 05/10/2017 08:14 AM   Modules accepted: Orders

## 2017-05-24 ENCOUNTER — Other Ambulatory Visit (HOSPITAL_COMMUNITY): Payer: Self-pay

## 2017-05-27 ENCOUNTER — Ambulatory Visit: Payer: Self-pay | Admitting: Internal Medicine

## 2017-05-27 ENCOUNTER — Ambulatory Visit (HOSPITAL_COMMUNITY): Payer: Self-pay | Attending: Cardiovascular Disease

## 2017-05-27 ENCOUNTER — Other Ambulatory Visit: Payer: Self-pay

## 2017-05-27 DIAGNOSIS — I110 Hypertensive heart disease with heart failure: Secondary | ICD-10-CM | POA: Insufficient documentation

## 2017-05-27 DIAGNOSIS — I4891 Unspecified atrial fibrillation: Secondary | ICD-10-CM | POA: Insufficient documentation

## 2017-05-27 DIAGNOSIS — I428 Other cardiomyopathies: Secondary | ICD-10-CM | POA: Insufficient documentation

## 2017-05-27 DIAGNOSIS — I509 Heart failure, unspecified: Secondary | ICD-10-CM | POA: Insufficient documentation

## 2017-06-01 ENCOUNTER — Ambulatory Visit: Payer: Self-pay | Admitting: Internal Medicine

## 2017-06-03 ENCOUNTER — Ambulatory Visit (INDEPENDENT_AMBULATORY_CARE_PROVIDER_SITE_OTHER): Payer: Self-pay | Admitting: Internal Medicine

## 2017-06-03 ENCOUNTER — Encounter: Payer: Self-pay | Admitting: Internal Medicine

## 2017-06-03 VITALS — BP 125/75 | HR 59 | Ht 73.5 in | Wt 217.8 lb

## 2017-06-03 DIAGNOSIS — I428 Other cardiomyopathies: Secondary | ICD-10-CM

## 2017-06-03 DIAGNOSIS — I5022 Chronic systolic (congestive) heart failure: Secondary | ICD-10-CM

## 2017-06-03 DIAGNOSIS — J449 Chronic obstructive pulmonary disease, unspecified: Secondary | ICD-10-CM

## 2017-06-03 DIAGNOSIS — I480 Paroxysmal atrial fibrillation: Secondary | ICD-10-CM

## 2017-06-03 MED ORDER — ALBUTEROL SULFATE HFA 108 (90 BASE) MCG/ACT IN AERS: 2.0000 | INHALATION_SPRAY | Freq: Four times a day (QID) | RESPIRATORY_TRACT | 3 refills | Status: DC | PRN

## 2017-06-03 MED ORDER — LOSARTAN POTASSIUM 25 MG PO TABS: 25.0000 mg | ORAL_TABLET | Freq: Every day | ORAL | 5 refills | Status: DC

## 2017-06-03 NOTE — Progress Notes (Signed)
OFFICE NOTE  Chief Complaint:  Follow-up heart failure  Primary Care Physician: [name], FNP  HPI:  Kevin [name] is a 61 y.o. male with past medical history of nonischemic cardiomyopathy (EF 25-30% in 2016, cath showed minimal CAD), PAF (initially diagnosed in 2016), COPD, moderate MR and HTN who presents to the office today for hospital follow-up. Was recently admitted at [facility] from 2/20 - 09/22/2016 for progressive dyspnea with exertion and fatigue. Was found to be in atrial fibrillation with RVR and volume overloaded by physical exam (BNP at 687). Echo showed a further reduced EF of 20-25% with diffuse HK. IV Diltiazem was transitioned to Coreg and Digoxin was added to his regimen due to hypotension. Was placed on Eliquis 5mg  BID for anticoagulation. Recorded as having a net output of -3.4L, yet weight significantly decreased from 234 lbs on admission to 207 lbs. Discharged on PO Lasix 40mg  BID and not placed on an ACE-I/ARB secondary to AKI.  He saw [place], PA-C in follow-up and has been doing well since then. He denies any worsening shortness of breath and he reports his weight is been stable around 200 pounds at home. He is uninsured and has not applied for Medicaid or any other assistance. We did try to provide resources for that today. I've also been asked to fill out paperwork to get patient assistance for his albuterol inhaler which I did today.  06/03/2017  Kevin [name] returns today for follow-up.  He seems to be maintaining sinus rhythm.  Recently had a repeat echo which showed improvement in LV function up to 25-30% however he has had persistently low EF since 2016.  He has been referred to cardiac electrophysiology for evaluation of an ICD and scheduled to see Dr. Elberta Fortis later this month.  I agree with proceeding for ICD placement.  He is been compliant with his medications.  I think there is room to make a small increase in his losartan to 25 mg 1 full  tablet daily today.  He is also asking for refill of his albuterol inhaler.  PMHx:  Past Medical History:  Diagnosis Date  . Asthma   . Chronic systolic CHF (congestive heart failure) (HCC)    a. 06/2015 Echo: EF 25-30%, mod-sev MR, PASP . b. 08/2016: echo showing EF of 20-25% with diffuse HK  . Cocaine use   . Essential hypertension   . Longstanding persistent atrial fibrillation (HCC)    a. Initially dx 06/2015, paroxysmal at that time. b. Recurrent in Feb 2018 and beyond.  . Moderate mitral regurgitation    a. 06/2015 Echo: Mod-Sev MR. b. 08/2016: echo showing moderate MR.   Marland Kitchen NICM (nonischemic cardiomyopathy) (HCC)    a. 06/2015 Echo: EF 25-30%, normal cors by cath - ? Tachy-mediated;    . Non-obstructive CAD    a. 06/2015 Cath: LM nl, LAD 48m, LCX nl, RCA nl, EF 25-30%.  . Tobacco use     No past surgical history on file.  FAMHx:  Family History  Problem Relation Age of Onset  . Hypertension Father     SOCHx:   reports that he has quit smoking. His smoking use included cigarettes. He has a 20.00 pack-year smoking history. he has never used smokeless tobacco. He reports that he drinks alcohol. He reports that he uses drugs.  ALLERGIES:  No Known Allergies  ROS: Pertinent items noted in HPI and remainder of comprehensive ROS otherwise negative.  HOME MEDS: Current Outpatient Medications on File  Prior to Visit  Medication Sig Dispense Refill  . albuterol (PROVENTIL HFA;VENTOLIN HFA) 108 (90 Base) MCG/ACT inhaler Inhale 2 puffs into the lungs every 6 (six) hours as needed for wheezing or shortness of breath. 1 Inhaler 3  . albuterol (PROVENTIL) (2.5 MG/3ML) 0.083% nebulizer solution Take 3 mLs (2.5 mg total) by nebulization every 6 (six) hours as needed for wheezing or shortness of breath. 75 mL 12  . apixaban (ELIQUIS) 5 MG TABS tablet Take 1 tablet (5 mg total) by mouth 2 (two) times daily. 60 tablet 2  . furosemide (LASIX) 40 MG tablet Take 1 tablet (40 mg total)  by mouth 2 (two) times daily. 60 tablet 2  . lansoprazole (PREVACID) 30 MG capsule Take 30 mg by mouth daily as needed (for heartburn/indigestion).    Marland Kitchen. losartan (COZAAR) 25 MG tablet Take 0.5 tablets (12.5 mg total) by mouth daily. 30 tablet 3  . metoprolol succinate (TOPROL-XL) 50 MG 24 hr tablet Take 2 tablets (100mg ) in the morning, 1 tablet (50mg ) in the evening with or after meal. 90 tablet 5  . potassium chloride SA (K-DUR,KLOR-CON) 20 MEQ tablet Take 1 tablet (20 mEq total) by mouth 2 (two) times daily. 30 tablet 0   No current facility-administered medications on file prior to visit.     LABS/IMAGING: No results found for this or any previous visit (from the past 48 hour(s)). No results found.  WEIGHTS: Wt Readings from Last 3 Encounters:  06/03/17 217 lb 12.8 oz (98.8 kg)  05/06/17 218 lb (98.9 kg)  04/10/17 207 lb 10.8 oz (94.2 kg)    VITALS: BP 125/75   Pulse (!) 59   Ht 6' 1.5" (1.867 m)   Wt 217 lb 12.8 oz (98.8 kg)   BMI 28.35 kg/m   EXAM: General appearance: alert and no distress Neck: no carotid bruit, no JVD and thyroid not enlarged, symmetric, no tenderness/mass/nodules Lungs: clear to auscultation bilaterally Heart: regular rate and rhythm Abdomen: soft, non-tender; bowel sounds normal; no masses,  no organomegaly Extremities: extremities normal, atraumatic, no cyanosis or edema Pulses: 2+ and symmetric Skin: Skin color, texture, turgor normal. No rashes or lesions Neurologic: Grossly normal Psych: Pleasant  EKG: Sinus bradycardia nonspecific T wave changes, QTC 475 ms-personally reviewed  ASSESSMENT: 1. Nonischemic cardio myopathy EF 25-30% 2. Recent acute on chronic systolic congestive heart failure 3. Long-standing persistent atrial fibrillation-CHADSVASC score 5 on Eliquis 4. Intermittent asthma-on albuterol  PLAN: 1.   Kevin [name] unfortunately has not had a significant improvement in LVEF.  He is scheduled to see Dr. Elberta Fortisamnitz or this month  for evaluation of ICD.  I think there is room to increase his losartan further up to 25 mg daily.  We will make that change today.  Also prescribed him additional albuterol inhaler.  He is compliant with Eliquis and remains in sinus rhythm.  Plan follow-up with me in 3 months.  Chrystie NoseKenneth C. Hilty, MD, [facility] Children'S HospitalFACC Attending Cardiologist [facility]  Chrystie NoseKenneth C Hilty 06/03/2017, 4:15 PM

## 2017-06-03 NOTE — Patient Instructions (Signed)
Your physician has recommended you make the following change in your medication:  -- INCREASE losartan to 25mg  daily  Your physician recommends that you schedule a follow-up appointment in: THREE MONTHS with Dr. [name]

## 2017-06-10 ENCOUNTER — Other Ambulatory Visit: Payer: Self-pay | Admitting: Internal Medicine

## 2017-06-10 DIAGNOSIS — J449 Chronic obstructive pulmonary disease, unspecified: Secondary | ICD-10-CM

## 2017-06-10 NOTE — Telephone Encounter (Signed)
Patient calling, states that he needs our office to fax his prescription for albuterol sent to [phone] patient assistance program.   *STAT* If patient is at the pharmacy, call can be transferred to refill team.   1. Which medications need to be refilled? (please list name of each medication and dose if known) albuterol 108 (90 base)  2. Which pharmacy/location (including street and city if local pharmacy) is medication to be sent to? [phone]  3. Do they need a 30 day or 90 day supply?

## 2017-06-10 NOTE — Telephone Encounter (Signed)
Left message, what is the name of the pt assistance program # given is not in pharmacy sysmed to send it to

## 2017-06-10 NOTE — Telephone Encounter (Signed)
Pt called returning this office call and gave the fax # to [employer] patient assitants again  F# [phone]

## 2017-06-11 MED ORDER — ALBUTEROL SULFATE HFA 108 (90 BASE) MCG/ACT IN AERS: 2.0000 | INHALATION_SPRAY | Freq: Four times a day (QID) | RESPIRATORY_TRACT | 3 refills | Status: DC | PRN

## 2017-06-11 NOTE — Telephone Encounter (Signed)
Called patient to verify that only a written Rx for albuterol will need to be faxed in. He states this is correct. He is aware MD will not be available to sign Rx until Monday

## 2017-06-21 ENCOUNTER — Ambulatory Visit: Payer: Self-pay | Admitting: Cardiology

## 2017-06-21 NOTE — Progress Notes (Deleted)
Electrophysiology Office Note   Date:  06/21/2017   ID:  Kevin [name], DOB 1956-05-30, MRN [id number]  PCP:  Bing NeighborsHarris, Kimberly S, FNP  Cardiologist:  Rennis GoldenHilty Primary Electrophysiologist:  Maricella Filyaw Jorja LoaMartin Daunte Oestreich, MD    No chief complaint on file.    History of Present Illness: Kevin [name] is a 61 y.o. male who is being seen today for the evaluation of CHF at the request of Tiburcio PeaHarris, Godfrey PickKimberly S, FNP. Presenting today for electrophysiology evaluation. He has a history of chronic systolic heart failure with an EF of 20-25%, nonischemic cardiomyopathy, persistent atrial fibrillation on liquids, hypertension, hyperlipidemia, and substance abuse.  Has a history of medication clients.  He presented to the clinic visits not on heart failure medications.  Medications were adjusted and a repeat echo showed an EF low at 25-30%.  Today, denies symptoms of palpitations, chest pain, shortness of breath, orthopnea, PND, lower extremity edema, claudication, dizziness, presyncope, syncope, bleeding, or neurologic sequela. The patient is tolerating medications without difficulties. ***    Past Medical History:  Diagnosis Date  . Asthma   . Chronic systolic CHF (congestive heart failure) (HCC)    a. 06/2015 Echo: EF 25-30%, mod-sev MR, PASP 37mmHg. b. 08/2016: echo showing EF of 20-25% with diffuse HK  . Cocaine use   . Essential hypertension   . Longstanding persistent atrial fibrillation (HCC)    a. Initially dx 06/2015, paroxysmal at that time. b. Recurrent in Feb 2018 and beyond.  . Moderate mitral regurgitation    a. 06/2015 Echo: Mod-Sev MR. b. 08/2016: echo showing moderate MR.   Marland Kitchen. NICM (nonischemic cardiomyopathy) (HCC)    a. 06/2015 Echo: EF 25-30%, normal cors by cath - ? Tachy-mediated;    . Non-obstructive CAD    a. 06/2015 Cath: LM nl, LAD 7029m, LCX nl, RCA nl, EF 25-30%.  . Tobacco use    Past Surgical History:  Procedure Laterality Date  . CARDIAC CATHETERIZATION N/A 07/26/2015   Procedure: Left Heart Cath and Coronary Angiography;  Surgeon: Corky CraftsJayadeep S Varanasi, MD;  [facility];  Service: Cardiovascular;  Laterality: N/A;  . CARDIOVERSION N/A 04/09/2017   Procedure: CARDIOVERSION;  Surgeon: Pricilla Riffleoss, Paula V, MD;  [facility];  Service: Cardiovascular;  Laterality: N/A;  . TEE WITHOUT CARDIOVERSION N/A 04/09/2017   Procedure: TRANSESOPHAGEAL ECHOCARDIOGRAM (TEE);  Surgeon: Pricilla Riffleoss, Paula V, MD;  [facility];  Service: Cardiovascular;  Laterality: N/A;     Current Outpatient Medications  Medication Sig Dispense Refill  . albuterol (PROVENTIL HFA;VENTOLIN HFA) 108 (90 Base) MCG/ACT inhaler Inhale 2 puffs every 6 (six) hours as needed into the lungs for wheezing or shortness of breath. 1 Inhaler 3  . albuterol (PROVENTIL) (2.5 MG/3ML) 0.083% nebulizer solution Take 3 mLs (2.5 mg total) by nebulization every 6 (six) hours as needed for wheezing or shortness of breath. 75 mL 12  . apixaban (ELIQUIS) 5 MG TABS tablet Take 1 tablet (5 mg total) by mouth 2 (two) times daily. 60 tablet 2  . furosemide (LASIX) 40 MG tablet Take 1 tablet (40 mg total) by mouth 2 (two) times daily. 60 tablet 2  . lansoprazole (PREVACID) 30 MG capsule Take 30 mg by mouth daily as needed (for heartburn/indigestion).    Marland Kitchen. losartan (COZAAR) 25 MG tablet Take 1 tablet (25 mg total) daily by mouth. 30 tablet 5  . metoprolol succinate (TOPROL-XL) 50 MG 24 hr tablet Take 2 tablets (100mg ) in the morning, 1 tablet (50mg ) in the evening with or  after meal. 90 tablet 5  . potassium chloride SA (K-DUR,KLOR-CON) 20 MEQ tablet Take 1 tablet (20 mEq total) by mouth 2 (two) times daily. 30 tablet 0   No current facility-administered medications for this visit.     Allergies:   Patient has no known allergies.   Social History:  The patient  reports that he has quit smoking. His smoking use included cigarettes. He has a 20.00 pack-year smoking history. he has never used smokeless  tobacco. He reports that he drinks alcohol. He reports that he uses drugs.   Family History:  The patient's family history includes Hypertension in his father.   ROS:  Please see the history of present illness.   Otherwise, review of systems is positive for ***.   All other systems are reviewed and negative.   PHYSICAL EXAM: VS:  There were no vitals taken for this visit. , BMI There is no height or weight on file to calculate BMI. GEN: Well nourished, well developed, in no acute distress  HEENT: normal  Neck: no JVD, carotid bruits, or masses Cardiac: ***RRR; no murmurs, rubs, or gallops,no edema  Respiratory:  clear to auscultation bilaterally, normal work of breathing GI: soft, nontender, nondistended, + BS MS: no deformity or atrophy  Skin: warm and dry Neuro:  Strength and sensation are intact Psych: euthymic mood, full affect  EKG:  EKG {ACTION; IS/IS [id number]} ordered today. Personal review of the ekg ordered *** shows ***   Recent Labs: 02/04/2017: ALT 29 04/04/2017: B Natriuretic Peptide 1,339.9; Hemoglobin 14.3; Platelets 193; TSH 1.415 04/09/2017: Magnesium 2.1 04/10/2017: BUN 22; Creatinine, Ser 1.19; Potassium 3.8; Sodium 138    Lipid Panel     Component Value Date/Time   CHOL 113 04/07/2017 0628   TRIG 91 04/07/2017 0628   HDL 26 (L) 04/07/2017 0628   CHOLHDL 4.3 04/07/2017 0628   VLDL 18 04/07/2017 0628   LDLCALC 69 04/07/2017 0628     Wt Readings from Last 3 Encounters:  06/03/17 217 lb 12.8 oz (98.8 kg)  05/06/17 218 lb (98.9 kg)  04/10/17 207 lb 10.8 oz (94.2 kg)      Other studies Reviewed: Additional studies/ records that were reviewed today include: TTE 05/27/17 Review of the above records today demonstrates:  - Left ventricle: Diffuse hypokinesis worse in the inferior wall.   The cavity size was moderately dilated. Wall thickness was   normal. Systolic function was severely reduced. The estimated   ejection fraction was in the range of 25% to  30%. Doppler   parameters are consistent with abnormal left ventricular   relaxation (grade 1 diastolic dysfunction). - Atrial septum: No defect or patent foramen ovale was identified.   ASSESSMENT AND PLAN:  1.  Chronic systolic heart failure: Ejection fraction 20-25% in 2018 with repeat echo pending. With nonobstructive coronary disease thought to be a nonischemic cardiomyopathy. Recent admission for heart failure exacerbation in the setting of medication noncompliance. Currently on Toprol-XL and Lasix. Is not on an ACE inhibitor or ARB due to hypotension. His blood pressure has improved today as his heart rate has improved as well. We'll switch him to Toprol-XL. We'll also start him on hydralazine and BiDil. I encouraged him to be compliant with his medications. We also talked about defibrillator implantation and we Jaculin Rasmus discuss it further at the next visit. We'll get an echocardiogram prior to that visit.***  2. Persistent atrial fibrillation: Currently on Toprol-XL and Eliquis.***  This patients CHA2DS2-VASc Score and unadjusted Ischemic Stroke Rate (%  per year) is equal to 2.2 % stroke rate/year from a score of 2  Above score calculated as 1 point each if present [CHF, HTN, DM, Vascular=MI/PAD/Aortic Plaque, Age if 65-74, or Male] Above score calculated as 2 points each if present [Age > 75, or Stroke/TIA/TE]  3. Low blood pressure: Has recovered with improved heart rate. Plan to titrate heart failure medications.***  4. Stage III CKG: Baseline creatinine 1.2-1.4. At baseline on most recent check.***  Current medicines are reviewed at length with the patient today.   The patient does not have concerns regarding his medicines.  The following changes were made today:  ***  Labs/ tests ordered today include:  No orders of the defined types were placed in this encounter.    Disposition:   FU with Verlie Liotta *** months  Signed, Lillianah Swartzentruber [name], MD  06/21/2017 8:40 AM      [facility] [address] [place] [address] [phone] (office) [phone] (fax)

## 2017-07-21 DIAGNOSIS — F1490 Cocaine use, unspecified, uncomplicated: Secondary | ICD-10-CM | POA: Insufficient documentation

## 2017-07-21 DIAGNOSIS — I428 Other cardiomyopathies: Secondary | ICD-10-CM | POA: Insufficient documentation

## 2017-07-21 DIAGNOSIS — J45909 Unspecified asthma, uncomplicated: Secondary | ICD-10-CM | POA: Insufficient documentation

## 2017-07-21 DIAGNOSIS — I340 Nonrheumatic mitral (valve) insufficiency: Secondary | ICD-10-CM | POA: Insufficient documentation

## 2017-07-21 DIAGNOSIS — Z720 Tobacco use: Secondary | ICD-10-CM | POA: Insufficient documentation

## 2017-07-21 DIAGNOSIS — I2510 Atherosclerotic heart disease of native coronary artery without angina pectoris: Secondary | ICD-10-CM | POA: Insufficient documentation

## 2017-08-05 ENCOUNTER — Ambulatory Visit: Payer: Self-pay | Admitting: Cardiology

## 2017-08-09 ENCOUNTER — Other Ambulatory Visit: Payer: Self-pay

## 2017-08-12 ENCOUNTER — Telehealth: Payer: Self-pay

## 2017-08-13 ENCOUNTER — Telehealth: Payer: Self-pay | Admitting: Family Medicine

## 2017-08-13 MED ORDER — APIXABAN 5 MG PO TABS: 5.0000 mg | ORAL_TABLET | Freq: Two times a day (BID) | ORAL | 0 refills | Status: DC

## 2017-08-13 NOTE — Telephone Encounter (Signed)
Left a vm for patient to callback and schedule a follow up visit

## 2017-08-13 NOTE — Telephone Encounter (Signed)
Please fax completed Eliquis financial assistance form to [facility] at [phone]  Godfrey Pick. Tiburcio Pea, MSN, FNP-C [facility]  [address] [place], [place] [address] [phone]

## 2017-08-13 NOTE — Telephone Encounter (Signed)
Form faxed

## 2017-08-24 ENCOUNTER — Encounter: Payer: Self-pay | Admitting: Family Medicine

## 2017-08-24 ENCOUNTER — Ambulatory Visit (INDEPENDENT_AMBULATORY_CARE_PROVIDER_SITE_OTHER): Payer: Self-pay | Admitting: Family Medicine

## 2017-08-24 VITALS — BP 114/74 | HR 80 | Temp 98.6°F | Resp 16 | Ht 73.5 in | Wt 229.0 lb

## 2017-08-24 DIAGNOSIS — R635 Abnormal weight gain: Secondary | ICD-10-CM

## 2017-08-24 DIAGNOSIS — R140 Abdominal distension (gaseous): Secondary | ICD-10-CM

## 2017-08-24 DIAGNOSIS — I509 Heart failure, unspecified: Secondary | ICD-10-CM

## 2017-08-24 DIAGNOSIS — Z7901 Long term (current) use of anticoagulants: Secondary | ICD-10-CM

## 2017-08-24 DIAGNOSIS — Z131 Encounter for screening for diabetes mellitus: Secondary | ICD-10-CM

## 2017-08-24 LAB — POCT URINALYSIS DIP (DEVICE)
Bilirubin Urine: NEGATIVE
Glucose, UA: NEGATIVE mg/dL
Hgb urine dipstick: NEGATIVE
Ketones, ur: NEGATIVE mg/dL
Leukocytes, UA: NEGATIVE
NITRITE: NEGATIVE
PH: 7 (ref 5.0–8.0)
PROTEIN: NEGATIVE mg/dL
Specific Gravity, Urine: 1.015 (ref 1.005–1.030)
Urobilinogen, UA: 0.2 mg/dL (ref 0.0–1.0)

## 2017-08-24 LAB — POCT GLYCOSYLATED HEMOGLOBIN (HGB A1C): HEMOGLOBIN A1C: 5.4

## 2017-08-24 NOTE — Progress Notes (Signed)
Patient ID: Kevin [name], male    DOB: 05-Apr-1956, 62 y.o.   MRN: [id number]  PCP: [name], FNP  Chief Complaint  Patient presents with  . Annual Exam  . Hand Pain    THUMB LOCKING UP    Subjective:  HPI Kevin [name] is a 62 y.o. male with a history of proximal atrial fibrillation, congestive heart failure-EF 25-30%, nonischemic cardiomyopathy, CAD with stent placement, essential hypertension, COPD , presents for chronic disease management.  Kevin [name] reports that he has been well overall.  Reports adherence to medications.  He weighs himself daily and has noted some recent weight gain however attributes that to poor dietary choices.  For cardiovascular conditions he is followed closely by heart care and cardiologist Dr. Zoila Shutter.  He underwent echocardiogram in November which showed mild improvement of EF increasing from 20% to 25-30%.  He continues to be free of chest pain, dizziness, shortness of breath is stable and consistent with baseline, denies new weakness. Reports effort to adhere to a Dash diet however admits to some lapses in dietary choices. Current Body mass index is 29.8 kg/m.  Does not routinely check his blood pressure at home however review of prior recent medical visits his blood pressure has remained stable below 120/90.   He has a new complaint today of right thumb locking.  Onset of thumb walking was 3 weeks ago.  Reports that his thumb locks in place and he has to manually manipulate his thumb with his other hand in order to facilitate movement.  He denies any prior injuries to his right thumb or fractures.  This is a new problem and has not occurred previously.  He has no known history of osteoarthritis. He denies any generalized hand pain.  Social History   Socioeconomic History  . Marital status: Single    Spouse name: Not on file  . Number of children: Not on file  . Years of education: Not on file  . Highest education level: Not on file  Social Needs   . Financial resource strain: Not on file  . Food insecurity - worry: Not on file  . Food insecurity - inability: Not on file  . Transportation needs - medical: Not on file  . Transportation needs - non-medical: Not on file  Occupational History    Employer: [employer]  Tobacco Use  . Smoking status: Former Smoker    Packs/day: 0.50    Years: 40.00    Pack years: 20.00    Types: Cigarettes  . Smokeless tobacco: Never Used  . Tobacco comment: Quit in 2014  Substance and Sexual Activity  . Alcohol use: Yes    Alcohol/week: 0.0 oz    Comment: 2-3 drinks per week.  . Drug use: Yes    Comment: Not currently. Quit in 2014. 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  . Sexual activity: No  Other Topics Concern  . Not on file  Social History Narrative  . Not on file    Family History  Problem Relation Age of Onset  . Hypertension Father    Review of Systems Pertinent negatives indicated in HPI. Patient Active Problem List   Diagnosis Date Noted  . Tobacco use   . Non-obstructive CAD   . NICM (nonischemic cardiomyopathy) (HCC)   . Moderate mitral regurgitation   . Cocaine use   . Asthma   . Persistent atrial fibrillation (HCC)   . DCM (dilated cardiomyopathy) (HCC)   . CHF (congestive heart failure) (HCC) 04/04/2017  .  Current use of long term anticoagulation 02/24/2017  . Hypotension 02/24/2017  . CKD (chronic kidney disease), stage II 02/08/2017  . History of cocaine use 02/08/2017  . Former tobacco use 02/08/2017  . Atrial fibrillation with rapid ventricular response (HCC)   . Longstanding persistent atrial fibrillation (HCC) 10/19/2016  . AKI (acute kidney injury) (HCC) 10/19/2016  . Mitral regurgitation 09/28/2016  . Essential hypertension 09/28/2016  . Acute on chronic systolic heart failure (HCC) 09/15/2016  . Non-ischemic cardiomyopathy (HCC)   . Coronary artery disease involving native coronary artery of native heart without angina pectoris   . Chronic systolic CHF  (congestive heart failure) (HCC) 07/26/2015  . Paroxysmal atrial fibrillation (HCC) 07/23/2015  . COPD (chronic obstructive pulmonary disease) (HCC) 07/23/2015  . Elevated troponin 07/23/2015  . Elevated brain natriuretic peptide (BNP) level 07/23/2015    No Known Allergies  Prior to Admission medications   Medication Sig Start Date End Date Taking? Authorizing Provider  albuterol (PROVENTIL HFA;VENTOLIN HFA) 108 (90 Base) MCG/ACT inhaler Inhale 2 puffs every 6 (six) hours as needed into the lungs for wheezing or shortness of breath. 06/11/17  Yes Hilty, Lisette Abu, MD  albuterol (PROVENTIL) (2.5 MG/3ML) 0.083% nebulizer solution Take 3 mLs (2.5 mg total) by nebulization every 6 (six) hours as needed for wheezing or shortness of breath. 04/10/17  Yes Regalado, Belkys A, MD  apixaban (ELIQUIS) 5 MG TABS tablet Take 1 tablet (5 mg total) by mouth 2 (two) times daily. 08/13/17  Yes [name], FNP  furosemide (LASIX) 40 MG tablet Take 1 tablet (40 mg total) by mouth 2 (two) times daily. 04/10/17  Yes Regalado, Belkys A, MD  lansoprazole (PREVACID) 30 MG capsule Take 30 mg by mouth daily as needed (for heartburn/indigestion).   Yes [provider]  losartan (COZAAR) 25 MG tablet Take 1 tablet (25 mg total) daily by mouth. 06/03/17 09/01/17 Yes Hilty, Lisette Abu, MD  metoprolol succinate (TOPROL-XL) 50 MG 24 hr tablet Take 2 tablets (100mg ) in the morning, 1 tablet (50mg ) in the evening with or after meal. 05/06/17  Yes Azalee Course, PA  potassium chloride SA (K-DUR,KLOR-CON) 20 MEQ tablet Take 1 tablet (20 mEq total) by mouth 2 (two) times daily. 04/10/17  Yes Regalado, Prentiss Bells, MD    Past Medical, Surgical Family and Social History reviewed and updated.    Objective:   Today's Vitals   08/24/17 1059  BP: 114/74  Pulse: 80  Resp: 16  Temp: 98.6 F (37 C)  TempSrc: Oral  SpO2: 96%  Weight: 229 lb (103.9 kg)  Height: 6' 1.5" (1.867 m)    Wt Readings from Last 3 Encounters:   08/24/17 229 lb (103.9 kg)  06/03/17 217 lb 12.8 oz (98.8 kg)  05/06/17 218 lb (98.9 kg)    Physical Exam  Constitutional: He is oriented to person, place, and time. He appears well-developed and well-nourished.  HENT:  Head: Normocephalic and atraumatic.  Eyes: Conjunctivae are normal. Pupils are equal, round, and reactive to light.  Neck: Normal range of motion. Neck supple.  Cardiovascular: Normal rate, regular rhythm, normal heart sounds and intact distal pulses.  Pulmonary/Chest: Effort normal. He has no decreased breath sounds.  Abdominal: Soft. He exhibits distension.  Musculoskeletal: Normal range of motion.  Neurological: He is alert and oriented to person, place, and time.  Skin: Skin is warm and dry.  Psychiatric: He has a normal mood and affect. His behavior is normal. Judgment and thought content normal.   Assessment &  Plan:  1. Congestive heart failure, unspecified HF chronicity, unspecified heart failure type (HCC), class 2 diastolic dysfunction. Last EF 20%. Reports recent weight gain.    2. Abdominal distension, possibly secondary to weight gain and or fluid retention. Will obtain a BMP today to rule out distention occurring due to a cardiac nature. Patient is encouraged to walk or engage in mild to moderate physical activity as tolerated to facilitate weight loss.  3. Anticoagulated, chronic, secondary to paroxysmal A. fib-will check a CMP today to evaluate renal function. Patient is negative of any active bleeding.  4. Unexplained weight gain, will check a BNP to rule out cardiac involvement.  Patient is to schedule follow-up with cardiology for 39-month follow-up.  In the meantime he has been educated on the importance of monitoring fluid intake as he has reduced functioning of his heart.  Patient also encouraged to closely follow a DASH diet and decrease intake of foods which are high in sodium.  Patient verbalized understanding.   5. Screening for diabetes  mellitus, A1c today is 5.4 which is within the normal range.  We will repeat in 12 months.   Orders Placed This Encounter  Procedures  . Comprehensive metabolic panel  . CBC with Differential  . Brain natriuretic peptide  . POCT glycosylated hemoglobin (Hb A1C)  . POCT urinalysis dip (device)     Godfrey Pick. Tiburcio Pea, MSN, FNP-C [facility]  [address] [place], [place] [address] [phone]

## 2017-08-24 NOTE — Patient Instructions (Addendum)
-  Limit fluid intake to no more than 1500 ml per day. -If you experiencing greater than 3 lb weight gain within 24 hours, notify our clinic or if after hours report to the Emergency Department. -It is important to keep your blood pressure under control. Monitor blood pressure at home and if you obtain readings greater than 150/90 consecutively over a period of 3 days, notify me here at the office. -Avoid adding table salt or eating food such as can soup or frozen meals which are high in sodium. This increases fluid retention and is likely to worsen your heart failure.  Follow with me or go to the emergency department if you experience the following:  Anytime you have any of the following symptoms: 1) 3 pound weight gain in 24 hours or 5 pounds in 1 week 2) shortness of breath, with or without a dry hacking cough 3) swelling in the hands, feet or stomach 4) if you have to sleep on extra pillows at night in order to breathe.    You will be notified of any abnormal labs. No medication changes today. Keep all follow-ups with cardiology.

## 2017-08-25 ENCOUNTER — Telehealth: Payer: Self-pay | Admitting: Family Medicine

## 2017-08-25 LAB — COMPREHENSIVE METABOLIC PANEL
A/G RATIO: 1.3 (ref 1.2–2.2)
ALT: 16 IU/L (ref 0–44)
AST: 19 IU/L (ref 0–40)
Albumin: 4.3 g/dL (ref 3.6–4.8)
Alkaline Phosphatase: 132 IU/L — ABNORMAL HIGH (ref 39–117)
BILIRUBIN TOTAL: 1.1 mg/dL (ref 0.0–1.2)
BUN / CREAT RATIO: 11 (ref 10–24)
BUN: 12 mg/dL (ref 8–27)
CHLORIDE: 100 mmol/L (ref 96–106)
CO2: 28 mmol/L (ref 20–29)
Calcium: 9.9 mg/dL (ref 8.6–10.2)
Creatinine, Ser: 1.14 mg/dL (ref 0.76–1.27)
GFR, EST AFRICAN AMERICAN: 79 mL/min/{1.73_m2} (ref >59)
GFR, EST NON AFRICAN AMERICAN: 68 mL/min/{1.73_m2} (ref >59)
GLOBULIN, TOTAL: 3.3 g/dL (ref 1.5–4.5)
Glucose: 111 mg/dL — ABNORMAL HIGH (ref 65–99)
POTASSIUM: 3.6 mmol/L (ref 3.5–5.2)
SODIUM: 143 mmol/L (ref 134–144)
TOTAL PROTEIN: 7.6 g/dL (ref 6.0–8.5)

## 2017-08-25 LAB — CBC WITH DIFFERENTIAL/PLATELET
BASOS ABS: 0.1 10*3/uL (ref 0.0–0.2)
BASOS: 1 %
EOS (ABSOLUTE): 0.3 10*3/uL (ref 0.0–0.4)
Eos: 3 %
Hematocrit: 42.6 % (ref 37.5–51.0)
Hemoglobin: 14.4 g/dL (ref 13.0–17.7)
Immature Grans (Abs): 0 10*3/uL (ref 0.0–0.1)
Immature Granulocytes: 0 %
LYMPHS ABS: 1.7 10*3/uL (ref 0.7–3.1)
Lymphs: 21 %
MCH: 28.5 pg (ref 26.6–33.0)
MCHC: 33.8 g/dL (ref 31.5–35.7)
MCV: 84 fL (ref 79–97)
Monocytes Absolute: 0.8 10*3/uL (ref 0.1–0.9)
Monocytes: 9 %
NEUTROS ABS: 5.3 10*3/uL (ref 1.4–7.0)
Neutrophils: 66 %
PLATELETS: 184 10*3/uL (ref 150–379)
RBC: 5.06 x10E6/uL (ref 4.14–5.80)
RDW: 14.9 % (ref 12.3–15.4)
WBC: 8.1 10*3/uL (ref 3.4–10.8)

## 2017-08-25 LAB — BRAIN NATRIURETIC PEPTIDE: BNP: 30 pg/mL (ref 0.0–100.0)

## 2017-08-25 NOTE — Telephone Encounter (Signed)
Contact patient to advise his labs were consistent with baseline. His BNP level was normal which indicates he is not retaining fluid. He should continue to exercise as tolerated to facilitate weight loss and should continue low sodium diet.   Godfrey Pick. Tiburcio Pea, MSN, FNP-C [facility]  [address] [place], [place] [address] [phone]

## 2017-08-25 NOTE — Telephone Encounter (Signed)
Left a vm for patient to callback 

## 2017-08-26 NOTE — Telephone Encounter (Signed)
Left a vm for patient to callback 

## 2017-08-30 ENCOUNTER — Encounter: Payer: Self-pay | Admitting: Cardiology

## 2017-08-30 ENCOUNTER — Encounter (INDEPENDENT_AMBULATORY_CARE_PROVIDER_SITE_OTHER): Payer: Self-pay

## 2017-08-30 ENCOUNTER — Ambulatory Visit (INDEPENDENT_AMBULATORY_CARE_PROVIDER_SITE_OTHER): Payer: Self-pay | Admitting: Cardiology

## 2017-08-30 VITALS — BP 120/72 | HR 74 | Ht 73.5 in | Wt 229.0 lb

## 2017-08-30 DIAGNOSIS — I428 Other cardiomyopathies: Secondary | ICD-10-CM

## 2017-08-30 DIAGNOSIS — I4819 Other persistent atrial fibrillation: Secondary | ICD-10-CM

## 2017-08-30 DIAGNOSIS — I481 Persistent atrial fibrillation: Secondary | ICD-10-CM

## 2017-08-30 NOTE — Patient Instructions (Signed)
Medication Instructions:  Your physician recommends that you continue on your current medications as directed. Please refer to the Current Medication list given to you today.  * If you need a refill on your cardiac medications before your next appointment, please call your pharmacy. *  Labwork: None ordered  Testing/Procedures: None ordered  Follow-Up: Your physician recommends that you schedule a follow-up appointment in: 3 months with Dr. Elberta Fortis.  Thank you for choosing [facility]!!   Dory Horn, RN [phone]  Any Other Special Instructions Will Be Listed Below (If Applicable).  Cardioverter Defibrillator Implantation An implantable cardioverter defibrillator (ICD) is a small device that is placed under the skin in the chest or abdomen. An ICD consists of a battery, a small computer (pulse generator), and wires (leads) that go into the heart. An ICD is used to detect and correct two types of dangerous irregular heartbeats (arrhythmias):  A rapid heart rhythm (tachycardia).  An arrhythmia in which the lower chambers of the heart (ventricles) contract in an uncoordinated way (fibrillation).  When an ICD detects tachycardia, it sends a low-energy shock to the heart to restore the heartbeat to normal (cardioversion). This signal is usually painless. If cardioversion does not work or if the ICD detects fibrillation, it delivers a high-energy shock to the heart (defibrillation) to restart the heart. This shock may feel like a strong jolt in the chest. Your health care provider may prescribe an ICD if:  You have had an arrhythmia that originated in the ventricles.  Your heart has been damaged by a disease or heart condition.  Sometimes, ICDs are programmed to act as a device called a pacemaker. Pacemakers can be used to treat a slow heartbeat (bradycardia) or tachycardia by taking over the heart rate with electrical impulses. Tell a health care provider about:  Any  allergies you have.  All medicines you are taking, including vitamins, herbs, eye drops, creams, and over-the-counter medicines.  Any problems you or family members have had with anesthetic medicines.  Any blood disorders you have.  Any surgeries you have had.  Any medical conditions you have.  Whether you are pregnant or may be pregnant. What are the risks? Generally, this is a safe procedure. However, problems may occur, including:  Swelling, bleeding, or bruising.  Infection.  Blood clots.  Damage to other structures or organs, such as nerves, blood vessels, or the heart.  Allergic reactions to medicines used during the procedure.  What happens before the procedure? Staying hydrated Follow instructions from your health care provider about hydration, which may include:  Up to 2 hours before the procedure - you may continue to drink clear liquids, such as water, clear fruit juice, black coffee, and plain tea.  Eating and drinking restrictions Follow instructions from your health care provider about eating and drinking, which may include:  8 hours before the procedure - stop eating heavy meals or foods such as meat, fried foods, or fatty foods.  6 hours before the procedure - stop eating light meals or foods, such as toast or cereal.  6 hours before the procedure - stop drinking milk or drinks that contain milk.  2 hours before the procedure - stop drinking clear liquids.  Medicine Ask your health care provider about:  Changing or stopping your normal medicines. This is important if you take diabetes medicines or blood thinners.  Taking medicines such as aspirin and ibuprofen. These medicines can thin your blood. Do not take these medicines before your procedure if  your doctor tells you not to.  Tests  You may have blood tests.  You may have a test to check the electrical signals in your heart (electrocardiogram, ECG).  You may have imaging tests, such as a  chest X-ray. General instructions  For 24 hours before the procedure, stop using products that contain nicotine or tobacco, such as cigarettes and e-cigarettes. If you need help quitting, ask your health care provider.  Plan to have someone take you home from the hospital or clinic.  You may be asked to shower with a germ-killing soap. What happens during the procedure?  To reduce your risk of infection: ? Your health care team will wash or sanitize their hands. ? Your skin will be washed with soap. ? Hair may be removed from the surgical area.  Small monitors will be put on your body. They will be used to check your heart, blood pressure, and oxygen level.  An IV tube will be inserted into one of your veins.  You will be given one or more of the following: ? A medicine to help you relax (sedative). ? A medicine to numb the area (local anesthetic). ? A medicine to make you fall asleep (general anesthetic).  Leads will be guided through a blood vessel into your heart and attached to your heart muscles. Depending on the ICD, the leads may go into one ventricle or they may go into both ventricles and into an upper chamber of the heart. An X-ray machine (fluoroscope) will be usedto help guide the leads.  A small incision will be made to create a deep pocket under your skin.  The pulse generator will be placed into the pocket.  The ICD will be tested.  The incision will be closed with stitches (sutures), skin glue, or staples.  A bandage (dressing) will be placed over the incision. This procedure may vary among health care providers and hospitals. What happens after the procedure?  Your blood pressure, heart rate, breathing rate, and blood oxygen level will be monitored often until the medicines you were given have worn off.  A chest X-ray will be taken to check that the ICD is in the right place.  You will need to stay in the hospital for 1-2 days so your health care provider  can make sure your ICD is working.  Do not drive for 24 hours if you received a sedative. Ask your health care provider when it is safe for you to drive.  You may be given an identification card explaining that you have an ICD. Summary  An implantable cardioverter defibrillator (ICD) is a small device that is placed under the skin in the chest or abdomen. It is used to detect and correct dangerous irregular heartbeats (arrhythmias).  An ICD consists of a battery, a small computer (pulse generator), and wires (leads) that go into the heart.  When an ICD detects rapid heart rhythm (tachycardia), it sends a low-energy shock to the heart to restore the heartbeat to normal (cardioversion). If cardioversion does not work or if the ICD detects uncoordinated heart contractions (fibrillation), it delivers a high-energy shock to the heart (defibrillation) to restart the heart.  You will need to stay in the hospital for 1-2 days to make sure your ICD is working. This information is not intended to replace advice given to you by your health care provider. Make sure you discuss any questions you have with your health care provider. Document Released: 04/04/2002 Document Revised: 07/22/2016 Document Reviewed:  07/22/2016 Elsevier Interactive Patient Education  2017 [employer].

## 2017-08-30 NOTE — Telephone Encounter (Signed)
Patient notified

## 2017-08-30 NOTE — Progress Notes (Signed)
Electrophysiology Office Note   Date:  08/30/2017   ID:  Kevin [name], DOB 08-Mar-1956, MRN [id number]  PCP:  [name], FNP  Cardiologist:  [name] Primary Electrophysiologist:  [name] [name], MD    Chief Complaint  Patient presents with  . Follow-up    Persistent Afib/Chronic systolic HF/Nonischemic cardiomyopathy     History of Present Illness: Kevin [name] is a 62 y.o. male who is being seen today for the evaluation of CHF at the request of [name], FNP. Presenting today for electrophysiology evaluation. He has a history of chronic systolic heart failure with an EF of 20-25%, nonischemic cardiomyopathy, persistent atrial fibrillation on liquids, hypertension, hyperlipidemia, and substance abuse. He is admitted to [facility] July 2018 for acute on chronic systolic heart failure in the setting of medication noncompliance. He was diuresed 9.7 L. It was thought that he would benefit from an ICD should he demonstrate medication compliance as an outpatient.  Today, denies symptoms of palpitations, chest pain, shortness of breath, orthopnea, PND, lower extremity edema, claudication, dizziness, presyncope, syncope, bleeding, or neurologic sequela. The patient is tolerating medications without difficulties.  He is currently feeling well.  He has been compliant with his medications.  He is noted no fatigue, weakness, or shortness of breath.  He does not have chest pain.   Past Medical History:  Diagnosis Date  . Asthma   . Chronic systolic CHF (congestive heart failure) (HCC)    a. 06/2015 Echo: EF 25-30%, mod-sev MR, PASP . b. 08/2016: echo showing EF of 20-25% with diffuse HK  . Cocaine use   . Essential hypertension   . Longstanding persistent atrial fibrillation (HCC)    a. Initially dx 06/2015, paroxysmal at that time. b. Recurrent in Feb 2018 and beyond.  . Moderate mitral regurgitation    a. 06/2015 Echo: Mod-Sev MR. b. 08/2016: echo showing moderate  MR.   Marland Kitchen NICM (nonischemic cardiomyopathy) (HCC)    a. 06/2015 Echo: EF 25-30%, normal cors by cath - ? Tachy-mediated;    . Non-obstructive CAD    a. 06/2015 Cath: LM nl, LAD 48m, LCX nl, RCA nl, EF 25-30%.  . Tobacco use    Past Surgical History:  Procedure Laterality Date  . CARDIAC CATHETERIZATION N/A 07/26/2015   Procedure: Left Heart Cath and Coronary Angiography;  Surgeon: Corky Crafts, MD;  Location[facility] INVASIVE CV LAB;  Service: Cardiovascular;  Laterality: N/A;  . CARDIOVERSION N/A 04/09/2017   Procedure: CARDIOVERSION;  Surgeon: Pricilla Riffle, MD;  Location: [facility] ENDOSCOPY;  Service: Cardiovascular;  Laterality: N/A;  . TEE WITHOUT CARDIOVERSION N/A 04/09/2017   Procedure: TRANSESOPHAGEAL ECHOCARDIOGRAM (TEE);  Surgeon: Pricilla Riffle, MD;  Location: [facility] ENDOSCOPY;  Service: Cardiovascular;  Laterality: N/A;     Current Outpatient Medications  Medication Sig Dispense Refill  . albuterol (PROVENTIL HFA;VENTOLIN HFA) 108 (90 Base) MCG/ACT inhaler Inhale 2 puffs every 6 (six) hours as needed into the lungs for wheezing or shortness of breath. 1 Inhaler 3  . albuterol (PROVENTIL) (2.5 MG/3ML) 0.083% nebulizer solution Take 3 mLs (2.5 mg total) by nebulization every 6 (six) hours as needed for wheezing or shortness of breath. 75 mL 12  . apixaban (ELIQUIS) 5 MG TABS tablet Take 1 tablet (5 mg total) by mouth 2 (two) times daily. 60 tablet 0  . furosemide (LASIX) 40 MG tablet Take 1 tablet (40 mg total) by mouth 2 (two) times daily. 60 tablet 2  . lansoprazole (PREVACID) 30 MG capsule Take  30 mg by mouth daily as needed (for heartburn/indigestion).    Marland Kitchen losartan (COZAAR) 25 MG tablet Take 1 tablet (25 mg total) daily by mouth. 30 tablet 5  . metoprolol succinate (TOPROL-XL) 50 MG 24 hr tablet Take 2 tablets (100mg ) in the morning, 1 tablet (50mg ) in the evening with or after meal. 90 tablet 5  . potassium chloride SA (K-DUR,KLOR-CON) 20 MEQ tablet Take 1 tablet (20 mEq total) by mouth  2 (two) times daily. 30 tablet 0   No current facility-administered medications for this visit.     Allergies:   Patient has no known allergies.   Social History:  The patient  reports that he has quit smoking. His smoking use included cigarettes. He has a 20.00 pack-year smoking history. he has never used smokeless tobacco. He reports that he drinks alcohol. He reports that he uses drugs.   Family History:  The patient's family history includes Hypertension in his father.   ROS:  Please see the history of present illness.   Otherwise, review of systems is positive for joint swelling.   All other systems are reviewed and negative.   PHYSICAL EXAM: VS:  BP 120/72   Pulse 74   Ht 6' 1.5" (1.867 m)   Wt 229 lb (103.9 kg)   BMI 29.80 kg/m  , BMI Body mass index is 29.8 kg/m. GEN: Well nourished, well developed, in no acute distress  HEENT: normal  Neck: no JVD, carotid bruits, or masses Cardiac: RRR; no murmurs, rubs, or gallops,no edema  Respiratory:  clear to auscultation bilaterally, normal work of breathing GI: soft, nontender, nondistended, + BS MS: no deformity or atrophy  Skin: warm and dry Neuro:  Strength and sensation are intact Psych: euthymic mood, full affect  EKG:  EKG is not ordered today. Personal review of the ekg ordered 06/03/17 shows snus rhythm, prolonged QTc 475 msec    Recent Labs: 04/04/2017: TSH 1.415 04/09/2017: Magnesium 2.1 08/24/2017: ALT 16; BNP 30.0; BUN 12; Creatinine, Ser 1.14; Hemoglobin 14.4; Platelets 184; Potassium 3.6; Sodium 143    Lipid Panel     Component Value Date/Time   CHOL 113 04/07/2017 0628   TRIG 91 04/07/2017 0628   HDL 26 (L) 04/07/2017 0628   CHOLHDL 4.3 04/07/2017 0628   VLDL 18 04/07/2017 0628   LDLCALC 69 04/07/2017 0628     Wt Readings from Last 3 Encounters:  08/30/17 229 lb (103.9 kg)  08/24/17 229 lb (103.9 kg)  06/03/17 217 lb 12.8 oz (98.8 kg)      Other studies Reviewed: Additional studies/ records that  were reviewed today include: TTE 05/27/17  Review of the above records today demonstrates:  - Left ventricle: Diffuse hypokinesis worse in the inferior wall.   The cavity size was moderately dilated. Wall thickness was   normal. Systolic function was severely reduced. The estimated   ejection fraction was in the range of 25% to 30%. Doppler   parameters are consistent with abnormal left ventricular   relaxation (grade 1 diastolic dysfunction). - Atrial septum: No defect or patent foramen ovale was identified.  LHC 12/16  Mild nonobstructive CAD, most notably in the LAD.  There is severe left ventricular systolic dysfunction.   ASSESSMENT AND PLAN:  1.  Chronic systolic heart failure: He has been compliant with his medications.  Repeat echo shows that his ejection fraction remains low.  Due to medication and salt compliance, he has class II symptoms.  He is feeling well currently and thus  does not wish to have an ICD implanted.  He says that he Aseret Hoffman discuss this with his family further, but at this time he does not feel that it is indicated.  2. Persistent atrial fibrillation: Currently on Toprol-XL and Eliquis.  No changes.  This patients CHA2DS2-VASc Score and unadjusted Ischemic Stroke Rate (% per year) is equal to 2.2 % stroke rate/year from a score of 2  Above score calculated as 1 point each if present [CHF, HTN, DM, Vascular=MI/PAD/Aortic Plaque, Age if 65-74, or Male] Above score calculated as 2 points each if present [Age > 75, or Stroke/TIA/TE]   3. Low blood pressure: Well-controlled today.  No changes.  4. Stage III CKG: At baseline on most recent check.  No changes.  Current medicines are reviewed at length with the patient today.   The patient does not have concerns regarding his medicines.  The following changes were made today: None  Labs/ tests ordered today include:  No orders of the defined types were placed in this encounter.  Case discussed with primary  cardiology Disposition:   FU with Jeremyah Jelley 6 months  Signed, Nikeshia Keetch [name], MD  08/30/2017 11:05 AM     [facility] [address] [place] [address] [phone] (office) [phone] (fax)

## 2017-09-09 ENCOUNTER — Ambulatory Visit: Payer: Self-pay | Admitting: Internal Medicine

## 2017-09-22 ENCOUNTER — Telehealth: Payer: Self-pay | Admitting: Family Medicine

## 2017-09-22 NOTE — Telephone Encounter (Signed)
Completed and faxed medication assistance form to [facility] for Eliqus.

## 2017-09-25 ENCOUNTER — Other Ambulatory Visit: Payer: Self-pay | Admitting: Physician Assistant

## 2017-09-27 NOTE — Telephone Encounter (Signed)
This is Dr. Hilty's pt 

## 2017-10-21 ENCOUNTER — Encounter: Payer: Self-pay | Admitting: Cardiology

## 2017-11-22 ENCOUNTER — Ambulatory Visit: Payer: Self-pay | Admitting: Internal Medicine

## 2017-11-23 ENCOUNTER — Encounter: Payer: Self-pay | Admitting: Internal Medicine

## 2017-11-23 ENCOUNTER — Ambulatory Visit (INDEPENDENT_AMBULATORY_CARE_PROVIDER_SITE_OTHER): Payer: Self-pay | Admitting: Internal Medicine

## 2017-11-23 VITALS — BP 114/78 | HR 63 | Ht 73.0 in | Wt 237.4 lb

## 2017-11-23 DIAGNOSIS — I10 Essential (primary) hypertension: Secondary | ICD-10-CM

## 2017-11-23 DIAGNOSIS — I480 Paroxysmal atrial fibrillation: Secondary | ICD-10-CM

## 2017-11-23 DIAGNOSIS — I5022 Chronic systolic (congestive) heart failure: Secondary | ICD-10-CM

## 2017-11-23 DIAGNOSIS — Z79899 Other long term (current) drug therapy: Secondary | ICD-10-CM

## 2017-11-23 DIAGNOSIS — I428 Other cardiomyopathies: Secondary | ICD-10-CM

## 2017-11-23 MED ORDER — SACUBITRIL-VALSARTAN 24-26 MG PO TABS: 1.0000 | ORAL_TABLET | Freq: Two times a day (BID) | ORAL | 0 refills | Status: DC

## 2017-11-23 NOTE — Progress Notes (Signed)
OFFICE NOTE  Chief Complaint:  Follow-up heart failure  Primary Care Physician: [name], FNP  HPI:  Kevin [name] is a 62 y.o. male with past medical history of nonischemic cardiomyopathy (EF 25-30% in 2016, cath showed minimal CAD), PAF (initially diagnosed in 2016), COPD, moderate MR and HTN who presents to the office today for hospital follow-up. Was recently admitted at [facility] from 2/20 - 09/22/2016 for progressive dyspnea with exertion and fatigue. Was found to be in atrial fibrillation with RVR and volume overloaded by physical exam (BNP at 687). Echo showed a further reduced EF of 20-25% with diffuse HK. IV Diltiazem was transitioned to Coreg and Digoxin was added to his regimen due to hypotension. Was placed on Eliquis 5mg  BID for anticoagulation. Recorded as having a net output of -3.4L, yet weight significantly decreased from 234 lbs on admission to 207 lbs. Discharged on PO Lasix 40mg  BID and not placed on an ACE-I/ARB secondary to AKI.  He saw [place], PA-C in follow-up and has been doing well since then. He denies any worsening shortness of breath and he reports his weight is been stable around 200 pounds at home. He is uninsured and has not applied for Medicaid or any other assistance. We did try to provide resources for that today. I've also been asked to fill out paperwork to get patient assistance for his albuterol inhaler which I did today.  06/03/2017  Mr. Blankley returns today for follow-up.  He seems to be maintaining sinus rhythm.  Recently had a repeat echo which showed improvement in LV function up to 25-30% however he has had persistently low EF since 2016.  He has been referred to cardiac electrophysiology for evaluation of an ICD and scheduled to see Dr. Elberta Fortis later this month.  I agree with proceeding for ICD placement.  He is been compliant with his medications.  I think there is room to make a small increase in his losartan to 25 mg 1 full  tablet daily today.  He is also asking for refill of his albuterol inhaler.  11/23/2017  Mr. Mardella Layman returns today for follow-up.  Saw Dr. Elberta Fortis but was not yet ready for an AICD.  I reviewed his medications further and he seems to be tolerating the increase in losartan.  Blood pressure is stable today 114/78.  We talked about trying to further optimize his medical therapy.  One option would be to switch him to [facility].  There is a possibility this could improve his cardiomyopathy.  The goal is of course to try to avoid the ICD if possible.  He is not interested in the ICD again at this time therefore we will try to further optimize his medical therapy.  PMHx:  Past Medical History:  Diagnosis Date  . Asthma   . Chronic systolic CHF (congestive heart failure) (HCC)    a. 06/2015 Echo: EF 25-30%, mod-sev MR, PASP . b. 08/2016: echo showing EF of 20-25% with diffuse HK  . Cocaine use   . Essential hypertension   . Longstanding persistent atrial fibrillation (HCC)    a. Initially dx 06/2015, paroxysmal at that time. b. Recurrent in Feb 2018 and beyond.  . Moderate mitral regurgitation    a. 06/2015 Echo: Mod-Sev MR. b. 08/2016: echo showing moderate MR.   Marland Kitchen NICM (nonischemic cardiomyopathy) (HCC)    a. 06/2015 Echo: EF 25-30%, normal cors by cath - ? Tachy-mediated;    . Non-obstructive CAD    a. 06/2015  Cath: LM nl, LAD 25m, LCX nl, RCA nl, EF 25-30%.  . Tobacco use     Past Surgical History:  Procedure Laterality Date  . CARDIAC CATHETERIZATION N/A 07/26/2015   Procedure: Left Heart Cath and Coronary Angiography;  Surgeon: Corky Crafts, MD;  Location: [facility] INVASIVE CV LAB;  Service: Cardiovascular;  Laterality: N/A;  . CARDIOVERSION N/A 04/09/2017   Procedure: CARDIOVERSION;  Surgeon: Pricilla Riffle, MD;  Location: [facility] ENDOSCOPY;  Service: Cardiovascular;  Laterality: N/A;  . TEE WITHOUT CARDIOVERSION N/A 04/09/2017   Procedure: TRANSESOPHAGEAL ECHOCARDIOGRAM (TEE);  Surgeon:  Pricilla Riffle, MD;  Location: [facility] ENDOSCOPY;  Service: Cardiovascular;  Laterality: N/A;    FAMHx:  Family History  Problem Relation Age of Onset  . Hypertension Father     SOCHx:   reports that he has quit smoking. His smoking use included cigarettes. He has a 20.00 pack-year smoking history. He has never used smokeless tobacco. He reports that he drinks alcohol. He reports that he has current or past drug history.  ALLERGIES:  No Known Allergies  ROS: Pertinent items noted in HPI and remainder of comprehensive ROS otherwise negative.  HOME MEDS: Current Outpatient Medications on File Prior to Visit  Medication Sig Dispense Refill  . albuterol (PROVENTIL HFA;VENTOLIN HFA) 108 (90 Base) MCG/ACT inhaler Inhale 2 puffs every 6 (six) hours as needed into the lungs for wheezing or shortness of breath. 1 Inhaler 3  . albuterol (PROVENTIL) (2.5 MG/3ML) 0.083% nebulizer solution Take 3 mLs (2.5 mg total) by nebulization every 6 (six) hours as needed for wheezing or shortness of breath. 75 mL 12  . apixaban (ELIQUIS) 5 MG TABS tablet Take 1 tablet (5 mg total) by mouth 2 (two) times daily. 60 tablet 0  . furosemide (LASIX) 40 MG tablet Take 1 tablet (40 mg total) by mouth 2 (two) times daily. 60 tablet 2  . lansoprazole (PREVACID) 30 MG capsule Take 30 mg by mouth daily as needed (for heartburn/indigestion).    . metoprolol succinate (TOPROL-XL) 50 MG 24 hr tablet Take 2 tablets (100mg ) in the morning, 1 tablet (50mg ) in the evening with or after meal. 90 tablet 5  . potassium chloride SA (KLOR-CON M20) 20 MEQ tablet Take 1 tablet (20 mEq total) by mouth 2 (two) times daily. 30 tablet 2  . losartan (COZAAR) 25 MG tablet Take 1 tablet (25 mg total) daily by mouth. 30 tablet 5   No current facility-administered medications on file prior to visit.     LABS/IMAGING: No results found for this or any previous visit (from the past 48 hour(s)). No results found.  WEIGHTS: Wt Readings from Last 3  Encounters:  11/23/17 237 lb 6.4 oz (107.7 kg)  08/30/17 229 lb (103.9 kg)  08/24/17 229 lb (103.9 kg)    VITALS: BP 114/78   Pulse 63   Ht 6\' 1"  (1.854 m)   Wt 237 lb 6.4 oz (107.7 kg)   BMI 31.32 kg/m   EXAM: General appearance: alert and no distress Neck: no carotid bruit, no JVD and thyroid not enlarged, symmetric, no tenderness/mass/nodules Lungs: clear to auscultation bilaterally Heart: regular rate and rhythm Abdomen: soft, non-tender; bowel sounds normal; no masses,  no organomegaly Extremities: extremities normal, atraumatic, no cyanosis or edema Pulses: 2+ and symmetric Skin: Skin color, texture, turgor normal. No rashes or lesions Neurologic: Grossly normal Psych: Pleasant  EKG: Normal sinus rhythm at 63, nonspecific T wave changes-personally reviewed  ASSESSMENT: 1. Nonischemic cardiomyopathy EF 25-30% 2. Acute  on chronic systolic congestive heart failure 3. Long-standing persistent atrial fibrillation-CHADSVASC score 5 on Eliquis 4. Intermittent asthma-on albuterol  PLAN: 1.   Mr. Million continues to have significant cardiomyopathy and was evaluated for AICD but declined.  We will continue to try to optimize his heart failure.  I think is a good candidate to switch from losartan over to [place].  We will start on the 24/26 dose given his low dose of losartan and propensity for low blood pressure.  If this is tolerated will increase the dose as he tolerates.  He has follow-up already scheduled with Dr. Elberta Fortis and can see me back in 6 months with a repeat echocardiogram at that time.  I will likely uptitrate his medication in the interim.  Chrystie Nose, MD, [facility], FACP  [place]  [facility]  Medical Director of the [facility] Lipid Disorders &  Cardiovascular Risk Reduction Clinic Diplomate of the American Board of Clinical Lipidology Attending Cardiologist  Direct Dial: [phone]  Fax: [phone]  Website:  [email or web address]  Barrett Goldie 11/23/2017, 10:02 AM

## 2017-11-23 NOTE — Patient Instructions (Addendum)
Your physician has recommended you make the following change in your medication:  -- STOP losartan -- START entresto 24-26mg  twice daily  -- free 30 day card, patient assistance application provided  Your physician recommends that you return for lab work ONE WEEK after being on entresto  Your physician has requested that you have an echocardiogram in 6 months @ [address] - [address]rd [address]. Echocardiography is a painless test that uses sound waves to create images of your heart. It provides your doctor with information about the size and shape of your heart and how well your heart's chambers and valves are working. This procedure takes approximately one hour. There are no restrictions for this procedure.  Your physician wants you to follow-up in: 6 months with Dr. [name] - after echo. You will receive a reminder letter in the mail two months in advance. If you don't receive a letter, please call our office to schedule the follow-up appointment.

## 2017-11-24 ENCOUNTER — Encounter: Payer: Self-pay | Admitting: Internal Medicine

## 2017-11-29 ENCOUNTER — Ambulatory Visit: Payer: Self-pay | Admitting: Cardiology

## 2017-12-07 ENCOUNTER — Encounter (INDEPENDENT_AMBULATORY_CARE_PROVIDER_SITE_OTHER): Payer: Self-pay

## 2017-12-07 ENCOUNTER — Encounter: Payer: Self-pay | Admitting: Cardiology

## 2017-12-07 ENCOUNTER — Ambulatory Visit (INDEPENDENT_AMBULATORY_CARE_PROVIDER_SITE_OTHER): Payer: Self-pay | Admitting: Cardiology

## 2017-12-07 ENCOUNTER — Telehealth: Payer: Self-pay | Admitting: Internal Medicine

## 2017-12-07 VITALS — BP 138/96 | HR 68 | Ht 73.0 in | Wt 243.6 lb

## 2017-12-07 DIAGNOSIS — I481 Persistent atrial fibrillation: Secondary | ICD-10-CM

## 2017-12-07 DIAGNOSIS — I4819 Other persistent atrial fibrillation: Secondary | ICD-10-CM

## 2017-12-07 DIAGNOSIS — I5022 Chronic systolic (congestive) heart failure: Secondary | ICD-10-CM

## 2017-12-07 MED ORDER — SACUBITRIL-VALSARTAN 49-51 MG PO TABS: 1.0000 | ORAL_TABLET | Freq: Two times a day (BID) | ORAL | Status: DC

## 2017-12-07 MED ORDER — SACUBITRIL-VALSARTAN 49-51 MG PO TABS: 1.0000 | ORAL_TABLET | Freq: Two times a day (BID) | ORAL | 11 refills | Status: DC

## 2017-12-07 NOTE — Patient Instructions (Signed)
Medication Instructions:  ;Your physician has recommended you make the following change in your medication:  1. INCREASE Entresto to 49-51 mg twice daily  Labwork: None ordered  Testing/Procedures: None ordered  Follow-Up: Your physician recommends that you schedule a follow-up appointment in: 2 weeks with pharmacist for Entresto titration and lab work  Your physician wants you to follow-up in: 1 year with Dr. Elberta Fortis.  You will receive a reminder letter in the mail two months in advance. If you don't receive a letter, please call our office to schedule the follow-up appointment.  * If you need a refill on your cardiac medications before your next appointment, please call your pharmacy.   *Please note that any paperwork needing to be filled out by the provider will need to be addressed at the front desk prior to seeing the provider. Please note that any FMLA, disability or other documents regarding health condition is subject to a $25.00 charge that must be received prior to completion of paperwork in the form of a money order or check.  Thank you for choosing [facility]!!   Dory Horn, RN [phone]

## 2017-12-07 NOTE — Progress Notes (Signed)
Electrophysiology Office Note   Date:  12/07/2017   ID:  Kevin [name], DOB [birth year], MRN [id number]  PCP:  [name], FNP  Cardiologist:  [name] Primary Electrophysiologist:  Will [name], MD    Chief Complaint  Patient presents with  . Follow-up    Persistent Afib/Nonischemic cardiomyopathy/Chronic systolic HF     History of Present Illness: Kevin [name] is a 62 y.o. male who is being seen today for the evaluation of CHF at the request of [name], FNP. Presenting today for electrophysiology evaluation. He has a history of chronic systolic heart failure with an EF of 20-25%, nonischemic cardiomyopathy, persistent atrial fibrillation on liquids, hypertension, hyperlipidemia, and substance abuse. He is admitted to [facility] July 2018 for acute on chronic systolic heart failure in the setting of medication noncompliance. He was diuresed 9.7 L. It was thought that he would benefit from an ICD should he demonstrate medication compliance as an outpatient, though he declined at the last visit.  Today, denies symptoms of palpitations, chest pain, shortness of breath, orthopnea, PND, lower extremity edema, claudication, dizziness, presyncope, syncope, bleeding, or neurologic sequela. The patient is tolerating medications without difficulties.  He is feeling well.  He has no complaints of chest pain or shortness of breath.  He reiterates that he does not wish to have a defibrillator.   Past Medical History:  Diagnosis Date  . Asthma   . Chronic systolic CHF (congestive heart failure) (HCC)    a. 06/2015 Echo: EF 25-30%, mod-sev MR, PASP . b. 08/2016: echo showing EF of 20-25% with diffuse HK  . Cocaine use   . Essential hypertension   . Longstanding persistent atrial fibrillation (HCC)    a. Initially dx 06/2015, paroxysmal at that time. b. Recurrent in Feb 2018 and beyond.  . Moderate mitral regurgitation    a. 06/2015 Echo: Mod-Sev MR. b. 08/2016: echo  showing moderate MR.   Marland Kitchen NICM (nonischemic cardiomyopathy) (HCC)    a. 06/2015 Echo: EF 25-30%, normal cors by cath - ? Tachy-mediated;    . Non-obstructive CAD    a. 06/2015 Cath: LM nl, LAD 30m, LCX nl, RCA nl, EF 25-30%.  . Tobacco use    Past Surgical History:  Procedure Laterality Date  . CARDIAC CATHETERIZATION N/A 07/26/2015   Procedure: Left Heart Cath and Coronary Angiography;  Surgeon: Corky Crafts, MD;  Location: [facility] INVASIVE CV LAB;  Service: Cardiovascular;  Laterality: N/A;  . CARDIOVERSION N/A 04/09/2017   Procedure: CARDIOVERSION;  Surgeon: Pricilla Riffle, MD;  Location: [facility] ENDOSCOPY;  Service: Cardiovascular;  Laterality: N/A;  . TEE WITHOUT CARDIOVERSION N/A 04/09/2017   Procedure: TRANSESOPHAGEAL ECHOCARDIOGRAM (TEE);  Surgeon: Pricilla Riffle, MD;  Location: [facility] ENDOSCOPY;  Service: Cardiovascular;  Laterality: N/A;     Current Outpatient Medications  Medication Sig Dispense Refill  . albuterol (PROVENTIL HFA;VENTOLIN HFA) 108 (90 Base) MCG/ACT inhaler Inhale 2 puffs every 6 (six) hours as needed into the lungs for wheezing or shortness of breath. 1 Inhaler 3  . albuterol (PROVENTIL) (2.5 MG/3ML) 0.083% nebulizer solution Take 3 mLs (2.5 mg total) by nebulization every 6 (six) hours as needed for wheezing or shortness of breath. 75 mL 12  . apixaban (ELIQUIS) 5 MG TABS tablet Take 1 tablet (5 mg total) by mouth 2 (two) times daily. 60 tablet 0  . furosemide (LASIX) 40 MG tablet Take 1 tablet (40 mg total) by mouth 2 (two) times daily. 60 tablet 2  . lansoprazole (PREVACID)  30 MG capsule Take 30 mg by mouth daily as needed (for heartburn/indigestion).    . metoprolol succinate (TOPROL-XL) 50 MG 24 hr tablet Take 2 tablets (100mg ) in the morning, 1 tablet (50mg ) in the evening with or after meal. 90 tablet 5  . potassium chloride SA (KLOR-CON M20) 20 MEQ tablet Take 1 tablet (20 mEq total) by mouth 2 (two) times daily. 30 tablet 2  . sacubitril-valsartan (ENTRESTO) 24-26  MG Take 1 tablet by mouth 2 (two) times daily. 60 tablet 0   No current facility-administered medications for this visit.     Allergies:   Patient has no known allergies.   Social History:  The patient  reports that he has quit smoking. His smoking use included cigarettes. He has a 20.00 pack-year smoking history. He has never used smokeless tobacco. He reports that he drinks alcohol. He reports that he has current or past drug history.   Family History:  The patient's family history includes Hypertension in his father.   ROS:  Please see the history of present illness.   Otherwise, review of systems is positive for none.   All other systems are reviewed and negative.   PHYSICAL EXAM: VS:  BP (!) 138/96   Pulse 68   Ht 6\' 1"  (1.854 m)   Wt 243 lb 9.6 oz (110.5 kg)   SpO2 95%   BMI 32.14 kg/m  , BMI Body mass index is 32.14 kg/m. GEN: Well nourished, well developed, in no acute distress  HEENT: normal  Neck: no JVD, carotid bruits, or masses Cardiac: RRR; no murmurs, rubs, or gallops,no edema  Respiratory:  clear to auscultation bilaterally, normal work of breathing GI: soft, nontender, nondistended, + BS MS: no deformity or atrophy  Skin: warm and dry Neuro:  Strength and sensation are intact Psych: euthymic mood, full affect  EKG:  EKG is not ordered today. Personal review of the ekg ordered 11/23/17 shows SR, rate 63  Recent Labs: 04/04/2017: TSH 1.415 04/09/2017: Magnesium 2.1 08/24/2017: ALT 16; BNP 30.0; BUN 12; Creatinine, Ser 1.14; Hemoglobin 14.4; Platelets 184; Potassium 3.6; Sodium 143    Lipid Panel     Component Value Date/Time   CHOL 113 04/07/2017 0628   TRIG 91 04/07/2017 0628   HDL 26 (L) 04/07/2017 0628   CHOLHDL 4.3 04/07/2017 0628   VLDL 18 04/07/2017 0628   LDLCALC 69 04/07/2017 0628     Wt Readings from Last 3 Encounters:  12/07/17 243 lb 9.6 oz (110.5 kg)  11/23/17 237 lb 6.4 oz (107.7 kg)  08/30/17 229 lb (103.9 kg)      Other studies  Reviewed: Additional studies/ records that were reviewed today include: TTE 05/27/17  Review of the above records today demonstrates:  - Left ventricle: Diffuse hypokinesis worse in the inferior wall.   The cavity size was moderately dilated. Wall thickness was   normal. Systolic function was severely reduced. The estimated   ejection fraction was in the range of 25% to 30%. Doppler   parameters are consistent with abnormal left ventricular   relaxation (grade 1 diastolic dysfunction). - Atrial septum: No defect or patent foramen ovale was identified.  LHC 12/16  Mild nonobstructive CAD, most notably in the LAD.  There is severe left ventricular systolic dysfunction.   ASSESSMENT AND PLAN:  1.  Chronic systolic heart failure: Continues to be insistent that he does not wish to have an ICD implanted.  He is currently on optimal therapy with Toprol-XL and Entresto.  His blood pressure is high normal today.  We will increase Entresto.  2. Persistent atrial fibrillation: Early on Toprol-XL and Eliquis.  In sinus rhythm today.  No changes.  This patients CHA2DS2-VASc Score and unadjusted Ischemic Stroke Rate (% per year) is equal to 2.2 % stroke rate/year from a score of 2  Above score calculated as 1 point each if present [CHF, HTN, DM, Vascular=MI/PAD/Aortic Plaque, Age if 65-74, or Male] Above score calculated as 2 points each if present [Age > 75, or Stroke/TIA/TE]  3. Stage III CKD: At baseline on most recent check.  Current medicines are reviewed at length with the patient today.   The patient does not have concerns regarding his medicines.  The following changes were made today: Increase Entresto  Labs/ tests ordered today include:  No orders of the defined types were placed in this encounter.   Disposition:   FU with Will Camnitz 12 months  Signed, Will [name], MD  12/07/2017 11:34 AM     [facility] [address] [place]  [address] [phone] (office) [phone] (fax)

## 2017-12-07 NOTE — Telephone Encounter (Signed)
Patient stopped by to inform Dr. [name] that Dr. Elberta Fortis increased his dose of entresto from 24-26mg  BID to 49-51mg  BID - he filled out walk-in form with this info.  LM for patient that this message was received, MD will be notified, and that Dr. Elberta Fortis has refilled his med for increased dose to pharmacy, and that he should call back if he needs assistance with patient assistance application/needs our office to submit.

## 2017-12-08 LAB — BASIC METABOLIC PANEL
BUN/Creatinine Ratio: 14 (ref 10–24)
BUN: 13 mg/dL (ref 8–27)
CHLORIDE: 105 mmol/L (ref 96–106)
CO2: 29 mmol/L (ref 20–29)
Calcium: 9.7 mg/dL (ref 8.6–10.2)
Creatinine, Ser: 0.94 mg/dL (ref 0.76–1.27)
GFR calc Af Amer: 99 mL/min/{1.73_m2} (ref >59)
GFR calc non Af Amer: 86 mL/min/{1.73_m2} (ref >59)
GLUCOSE: 58 mg/dL — AB (ref 65–99)
POTASSIUM: 3.9 mmol/L (ref 3.5–5.2)
SODIUM: 146 mmol/L — AB (ref 134–144)

## 2017-12-10 ENCOUNTER — Telehealth: Payer: Self-pay | Admitting: Internal Medicine

## 2017-12-10 NOTE — Telephone Encounter (Signed)
LM for patient with BMET results & that entresto patient assistance will be faxed to [employer] @ [phone]

## 2017-12-28 ENCOUNTER — Ambulatory Visit: Payer: Self-pay

## 2018-01-06 ENCOUNTER — Other Ambulatory Visit: Payer: Self-pay | Admitting: Internal Medicine

## 2018-01-18 ENCOUNTER — Ambulatory Visit (INDEPENDENT_AMBULATORY_CARE_PROVIDER_SITE_OTHER): Payer: Self-pay | Admitting: Pharmacist

## 2018-01-18 VITALS — BP 124/80 | HR 95 | Wt 238.5 lb

## 2018-01-18 DIAGNOSIS — I5022 Chronic systolic (congestive) heart failure: Secondary | ICD-10-CM

## 2018-01-18 MED ORDER — SPIRONOLACTONE 25 MG PO TABS: ORAL_TABLET | ORAL | 11 refills | Status: DC

## 2018-01-18 MED ORDER — METOPROLOL SUCCINATE ER 50 MG PO TB24: ORAL_TABLET | ORAL | 11 refills | Status: DC

## 2018-01-18 NOTE — Patient Instructions (Addendum)
Start taking spironolactone - 1/2 tablet each day to help your heart and blood pressure  Stop taking your potassium supplement  I refilled your Toprol at [facility] - this should be cheaper for you  Continue taking your other medications  Follow up for lab work in 1 week - Tuesday July 2nd any time after 7:30am  Follow up for blood pressure check in 3 weeks - Tuesday July 16th at 8:30am

## 2018-01-18 NOTE — Progress Notes (Signed)
Patient ID: Kevin [name]                 DOB: [birth year]                      MRN: [id number]     HPI: Kevin [name] is a 62 y.o. male referred by Dr. Elberta Fortis to pharmacy clinic for HF medication optimization. PMH is significant for nonobstructive CAD, CHF with LVEF 25-30% on 05/2017 echo, nonischemic cardiomyopathy, persistent afib, HTN, HLD, tobacco abuse and substance abuse. He was admitted to [facility] in July 2018 for acute on chronic systolic HF in the setting of medication noncompliance. He was diuresed 9.7L. Pt has denied ICD implantation. At last visit in clinic 5 weeks ago, his Entresto dose was increased to 49-51mg  BID.  Pt presents today in good spirits. He does not check his BP at home. He reports tolerating his higher dose of Entresto well and reports an increase in energy. He is receiving Entresto from patient assistance program. He did run out of his Toprol a few days ago - HR typically in the 60s but up to the mid 90s today. He has only been taking KCl daily rather than BID as prescribed. K last month was stable at 3.9. Pt does not have insurance and his Toprol is currently $40 a month at [employer]. Will send rx to [facility] as it is $24 there. Pt brings in albuterol patient assistance today. States Dr [name] filled this out for him last year, Dr Elberta Fortis ok with filling this out today. This has been mailed.  Current HF meds: Toprol 100mg  AM and 50mg  PM, Entresto 49-51mg  BID, furosemide 40mg  BID BP goal: <130/48mmHg  Family History: The patient's family history includes Hypertension in his father.   Social History:  The patient  reports that he has quit smoking. His smoking use included cigarettes. He has a 20.00 pack-year smoking history. He has never used smokeless tobacco. He reports that he drinks alcohol. History of cocaine use.   Diet: Eats 2 meals a day. Works at [employer] - tries to eat grilled chicken or steak there. Eating more salads and fish. Cutting back on fried  foods.   Exercise: Walks regularly.  Home BP readings: Does not check his BP  Wt Readings from Last 3 Encounters:  12/07/17 243 lb 9.6 oz (110.5 kg)  11/23/17 237 lb 6.4 oz (107.7 kg)  08/30/17 229 lb (103.9 kg)   BP Readings from Last 3 Encounters:  12/07/17 (!) 138/96  11/23/17 114/78  08/30/17 120/72   Pulse Readings from Last 3 Encounters:  12/07/17 68  11/23/17 63  08/30/17 74    Renal function: CrCl cannot be calculated (Patient's most recent lab result is older than the maximum 21 days allowed.).  Past Medical History:  Diagnosis Date  . Asthma   . Chronic systolic CHF (congestive heart failure) (HCC)    a. 06/2015 Echo: EF 25-30%, mod-sev MR, PASP . b. 08/2016: echo showing EF of 20-25% with diffuse HK  . Cocaine use   . Essential hypertension   . Longstanding persistent atrial fibrillation (HCC)    a. Initially dx 06/2015, paroxysmal at that time. b. Recurrent in Feb 2018 and beyond.  . Moderate mitral regurgitation    a. 06/2015 Echo: Mod-Sev MR. b. 08/2016: echo showing moderate MR.   Marland Kitchen NICM (nonischemic cardiomyopathy) (HCC)    a. 06/2015 Echo: EF 25-30%, normal cors by cath - ? Tachy-mediated;    Marland Kitchen  Non-obstructive CAD    a. 06/2015 Cath: LM nl, LAD 60m, LCX nl, RCA nl, EF 25-30%.  . Tobacco use     Current Outpatient Medications on File Prior to Visit  Medication Sig Dispense Refill  . albuterol (PROVENTIL HFA;VENTOLIN HFA) 108 (90 Base) MCG/ACT inhaler Inhale 2 puffs every 6 (six) hours as needed into the lungs for wheezing or shortness of breath. 1 Inhaler 3  . albuterol (PROVENTIL) (2.5 MG/3ML) 0.083% nebulizer solution Take 3 mLs (2.5 mg total) by nebulization every 6 (six) hours as needed for wheezing or shortness of breath. 75 mL 12  . apixaban (ELIQUIS) 5 MG TABS tablet Take 1 tablet (5 mg total) by mouth 2 (two) times daily. 60 tablet 0  . ENTRESTO 24-26 MG TAKE 1 TABLET BY MOUTH TWICE A DAY 60 tablet 11  . furosemide (LASIX) 40 MG tablet  Take 1 tablet (40 mg total) by mouth 2 (two) times daily. 60 tablet 2  . lansoprazole (PREVACID) 30 MG capsule Take 30 mg by mouth daily as needed (for heartburn/indigestion).    . metoprolol succinate (TOPROL-XL) 50 MG 24 hr tablet Take 2 tablets (100mg ) in the morning, 1 tablet (50mg ) in the evening with or after meal. 90 tablet 5  . potassium chloride SA (KLOR-CON M20) 20 MEQ tablet Take 1 tablet (20 mEq total) by mouth 2 (two) times daily. 30 tablet 2   No current facility-administered medications on file prior to visit.     No Known Allergies   Assessment/Plan:  1. HF medication optimization - BP normal today, will check BMET with recent Entresto dose increase. Will start spironolactone 12.5mg  daily for mortality benefit in HFrEF patients. Will stop KCl daily dosing as well. Refill sent in on Toprol to [facility] as this will save pt $16 per month compared to where he was previously filling. Encouraged medication adherence as HR increased from 60s to 90s today after pt ran out of Toprol a few days ago. Recheck BMET in 1 week and f/u in pharmacy clinic in 3 weeks for BP check.    Michelina Mexicano E. Daneil Beem, PharmD, BCACP, CPP [facility] [address], [place], [place] [address] Phone: [phone]; Fax: [phone] 01/18/2018 12:23 PM

## 2018-01-19 LAB — BASIC METABOLIC PANEL
BUN / CREAT RATIO: 11 (ref 10–24)
BUN: 11 mg/dL (ref 8–27)
CO2: 26 mmol/L (ref 20–29)
CREATININE: 1 mg/dL (ref 0.76–1.27)
Calcium: 10.1 mg/dL (ref 8.6–10.2)
Chloride: 103 mmol/L (ref 96–106)
GFR calc Af Amer: 92 mL/min/{1.73_m2} (ref >59)
GFR calc non Af Amer: 80 mL/min/{1.73_m2} (ref >59)
GLUCOSE: 78 mg/dL (ref 65–99)
Potassium: 4.6 mmol/L (ref 3.5–5.2)
SODIUM: 142 mmol/L (ref 134–144)

## 2018-01-25 ENCOUNTER — Other Ambulatory Visit: Payer: Self-pay

## 2018-02-01 ENCOUNTER — Telehealth: Payer: Self-pay

## 2018-02-01 NOTE — Telephone Encounter (Signed)
Yesterday we received a [employer] pt assistance program enrollment form in the mail from [employer] with a letter attached stating that the pt did not sign the Systems developer and Authorization" part of his application.  I called the pt and made him aware.  He came to the office this morning and signed the form and I have placed it in the mail in the self addressed envelope that was provided by [employer].

## 2018-02-08 ENCOUNTER — Ambulatory Visit: Payer: Self-pay | Admitting: Pharmacist

## 2018-02-08 NOTE — Progress Notes (Deleted)
Patient ID: Kevin [name]                 DOB: [birth year]                      MRN: [id number]     HPI: Kevin [name] is a 62 y.o. male referred by Dr. Elberta Fortis to pharmacy clinic for HF medication optimization. PMH is significant for nonobstructive CAD, CHF with LVEF 25-30% on 05/2017 echo, nonischemic cardiomyopathy, persistent afib, HTN, HLD, tobacco abuse and substance abuse. He was admitted to [facility] in July 2018 for acute on chronic systolic HF in the setting of medication noncompliance. He was diuresed 9.7L. Pt has denied ICD implantation. At last visit in clinic, spironolactone 12.5mg  daily was started and K supplementation was discontinued.  Did not come in for bmet 1 week after starting spiro, needs this today  Pt presents today in good spirits. He does not check his BP at home. He reports tolerating his higher dose of Entresto well and reports an increase in energy. He is receiving Entresto from patient assistance program. He did run out of his Toprol a few days ago - HR typically in the 60s but up to the mid 90s today. He has only been taking KCl daily rather than BID as prescribed. K last month was stable at 3.9. Pt does not have insurance and his Toprol is currently $40 a month at [facility] [facility]. Will send rx to [facility] as it is $24 there.    Current HF meds: Toprol 100mg  AM and 50mg  PM, Entresto 49-51mg  BID, spironolactone 12.5mg  daily, furosemide 40mg  BID BP goal: <130/72mmHg  Family History: The patient's family history includes Hypertension in his father.   Social History:  The patient  reports that he has quit smoking. His smoking use included cigarettes. He has a 20.00 pack-year smoking history. He has never used smokeless tobacco. He reports that he drinks alcohol. History of cocaine use.   Diet: Eats 2 meals a day. Works at [employer] - tries to eat grilled chicken or steak there. Eating more salads and fish. Cutting back on fried foods.   Exercise: Walks  regularly.  Home BP readings: Does not check his BP  Wt Readings from Last 3 Encounters:  01/18/18 238 lb 8 oz (108.2 kg)  12/07/17 243 lb 9.6 oz (110.5 kg)  11/23/17 237 lb 6.4 oz (107.7 kg)   BP Readings from Last 3 Encounters:  01/18/18 124/80  12/07/17 (!) 138/96  11/23/17 114/78   Pulse Readings from Last 3 Encounters:  01/18/18 95  12/07/17 68  11/23/17 63    Renal function: CrCl cannot be calculated (Unknown ideal weight.).  Past Medical History:  Diagnosis Date  . Asthma   . Chronic systolic CHF (congestive heart failure) (HCC)    a. 06/2015 Echo: EF 25-30%, mod-sev MR, PASP . b. 08/2016: echo showing EF of 20-25% with diffuse HK  . Cocaine use   . Essential hypertension   . Longstanding persistent atrial fibrillation (HCC)    a. Initially dx 06/2015, paroxysmal at that time. b. Recurrent in Feb 2018 and beyond.  . Moderate mitral regurgitation    a. 06/2015 Echo: Mod-Sev MR. b. 08/2016: echo showing moderate MR.   Marland Kitchen NICM (nonischemic cardiomyopathy) (HCC)    a. 06/2015 Echo: EF 25-30%, normal cors by cath - ? Tachy-mediated;    . Non-obstructive CAD    a. 06/2015 Cath: LM nl, LAD 56m, LCX nl, RCA nl, EF 25-30%.  Marland Kitchen  Tobacco use     Current Outpatient Medications on File Prior to Visit  Medication Sig Dispense Refill  . albuterol (PROVENTIL HFA;VENTOLIN HFA) 108 (90 Base) MCG/ACT inhaler Inhale 2 puffs every 6 (six) hours as needed into the lungs for wheezing or shortness of breath. 1 Inhaler 3  . albuterol (PROVENTIL) (2.5 MG/3ML) 0.083% nebulizer solution Take 3 mLs (2.5 mg total) by nebulization every 6 (six) hours as needed for wheezing or shortness of breath. 75 mL 12  . apixaban (ELIQUIS) 5 MG TABS tablet Take 1 tablet (5 mg total) by mouth 2 (two) times daily. 60 tablet 0  . furosemide (LASIX) 40 MG tablet Take 1 tablet (40 mg total) by mouth 2 (two) times daily. 60 tablet 2  . lansoprazole (PREVACID) 30 MG capsule Take 30 mg by mouth daily as needed  (for heartburn/indigestion).    . metoprolol succinate (TOPROL-XL) 50 MG 24 hr tablet Take 2 tablets (100mg ) in the morning, 1 tablet (50mg ) in the evening with or after meal. 90 tablet 11  . sacubitril-valsartan (ENTRESTO) 49-51 MG Take 1 tablet by mouth 2 (two) times daily.    Marland Kitchen spironolactone (ALDACTONE) 25 MG tablet Take 1/2 to 1 tablet daily as instructed. 30 tablet 11   No current facility-administered medications on file prior to visit.     No Known Allergies   Assessment/Plan:  1. HF medication optimization - BP normal today, will check BMET with recent Entresto dose increase. Will start spironolactone 12.5mg  daily for mortality benefit in HFrEF patients. Will stop KCl daily dosing as well. Refill sent in on Toprol to [facility] as this will save pt $16 per month compared to where he was previously filling. Encouraged medication adherence as HR increased from 60s to 90s today after pt ran out of Toprol a few days ago. Recheck BMET in 1 week and f/u in pharmacy clinic in 3 weeks for BP check.    Megan E. Supple, PharmD, BCACP, CPP [facility] [address] [place], [place] [address] Phone: [phone]; Fax: [phone] 02/08/2018 7:20 AM

## 2018-02-21 ENCOUNTER — Ambulatory Visit: Payer: Self-pay | Admitting: Family Medicine

## 2018-05-03 IMAGING — DX DG CHEST 1V PORT
1 series · 1 of 1 positions shown · non-contrast
Comparison: Chest x-ray of 07/23/2015 and CT angio chest of the
same day

CLINICAL DATA: Worsening shortness of breath over the last few
days, history of asthma and atrial fibrillation

EXAM:
PORTABLE CHEST 1 VIEW

[chest ap]
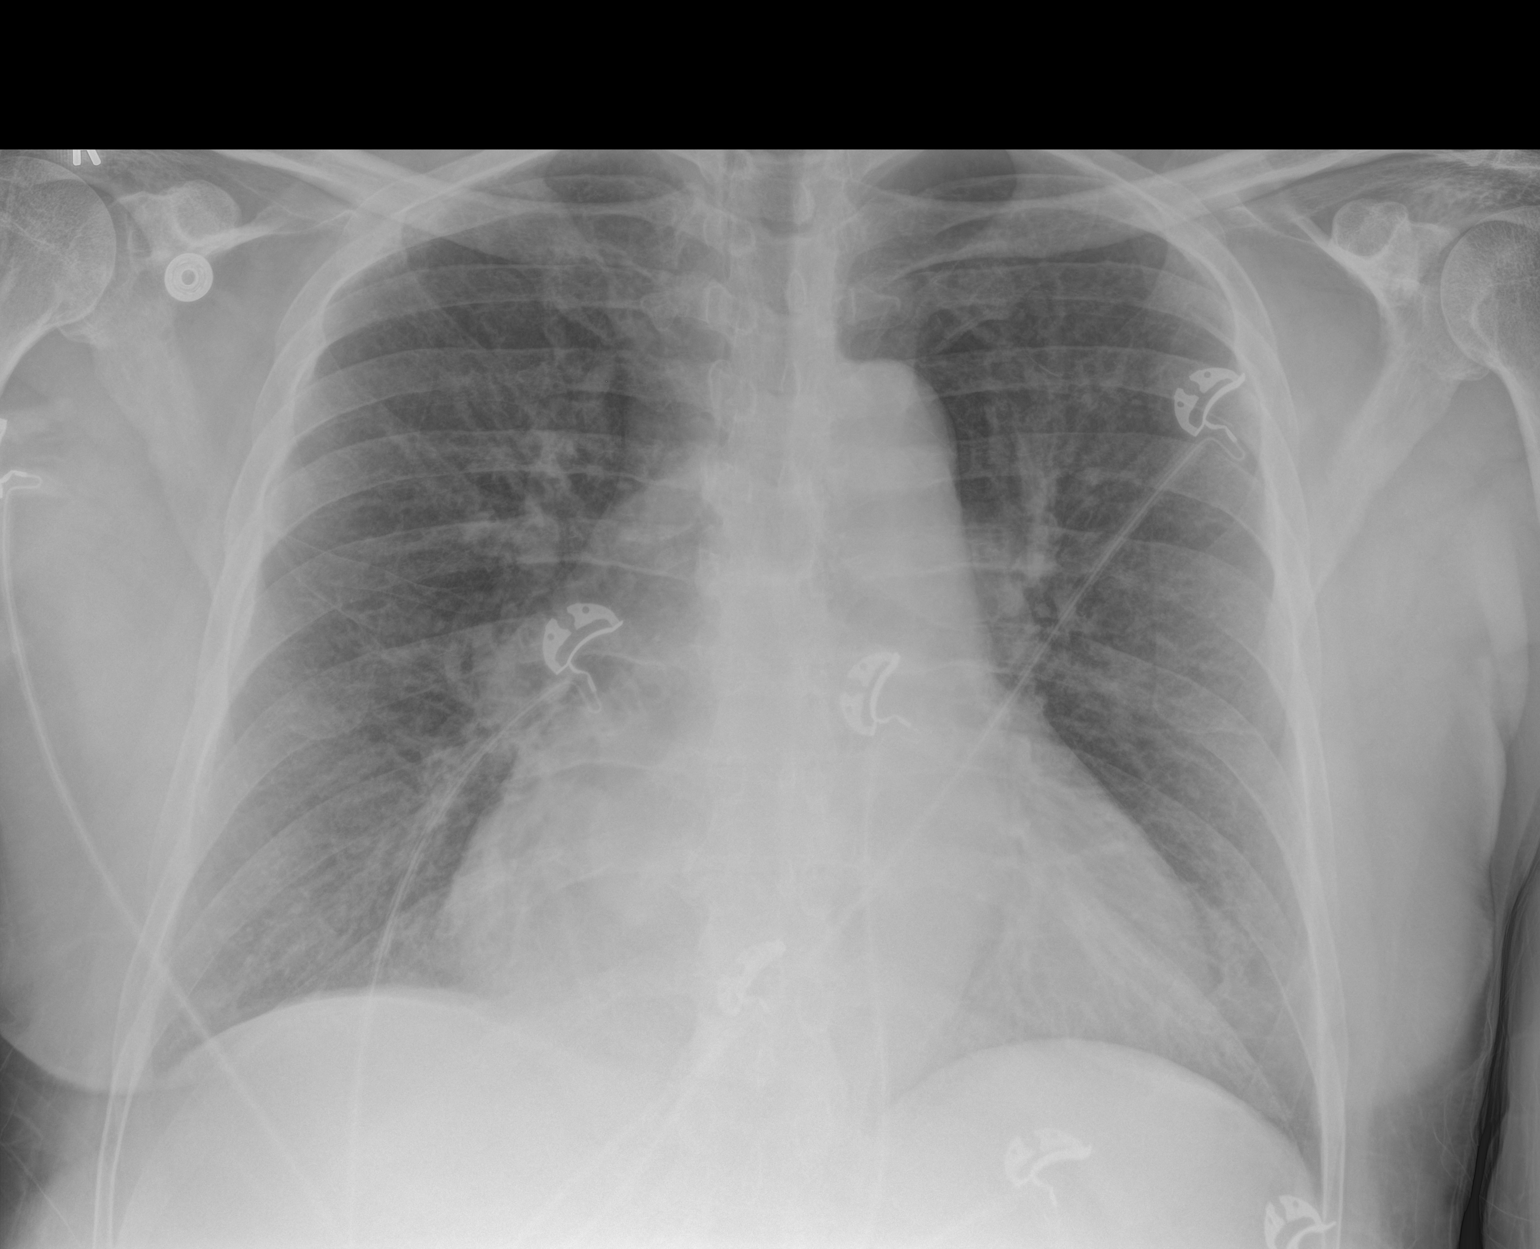

[1 of 1 positions shown; findings below may reference images not displayed]

FINDINGS: Moderate cardiomegaly is stable. Somewhat prominent interstitial
markings remain throughout the lungs but no definite pneumonia or
effusion is seen. No definite congestive heart failure is evident.
The bony thorax appears intact with degenerative changes present in
the shoulders.
IMPRESSION: Stable moderate cardiomegaly. No definite active process. Slight
prominence of interstitial markings.

## 2018-05-10 ENCOUNTER — Other Ambulatory Visit (HOSPITAL_COMMUNITY): Payer: Self-pay

## 2018-05-19 ENCOUNTER — Ambulatory Visit (HOSPITAL_COMMUNITY): Payer: Self-pay | Attending: Internal Medicine

## 2018-05-19 ENCOUNTER — Other Ambulatory Visit: Payer: Self-pay

## 2018-05-19 DIAGNOSIS — I428 Other cardiomyopathies: Secondary | ICD-10-CM | POA: Insufficient documentation

## 2018-05-21 ENCOUNTER — Other Ambulatory Visit: Payer: Self-pay | Admitting: Internal Medicine

## 2018-06-08 ENCOUNTER — Other Ambulatory Visit: Payer: Self-pay | Admitting: Internal Medicine

## 2018-06-20 ENCOUNTER — Other Ambulatory Visit: Payer: Self-pay

## 2018-06-20 MED ORDER — FUROSEMIDE 40 MG PO TABS: 40.0000 mg | ORAL_TABLET | Freq: Two times a day (BID) | ORAL | 3 refills | Status: DC

## 2018-08-23 ENCOUNTER — Other Ambulatory Visit: Payer: Self-pay | Admitting: Family Medicine

## 2018-09-16 ENCOUNTER — Ambulatory Visit: Payer: Self-pay | Admitting: Internal Medicine

## 2018-09-22 IMAGING — CR DG ABDOMEN ACUTE W/ 1V CHEST
4 series · 4 of 4 positions shown · non-contrast
Comparison: 09/18/2016

CLINICAL DATA: Increased abd discomfort, bloating over [REDACTED]months with recent constipation, cough, prev smoker

EXAM:
DG ABDOMEN ACUTE W/ 1V CHEST

[w chest pa]
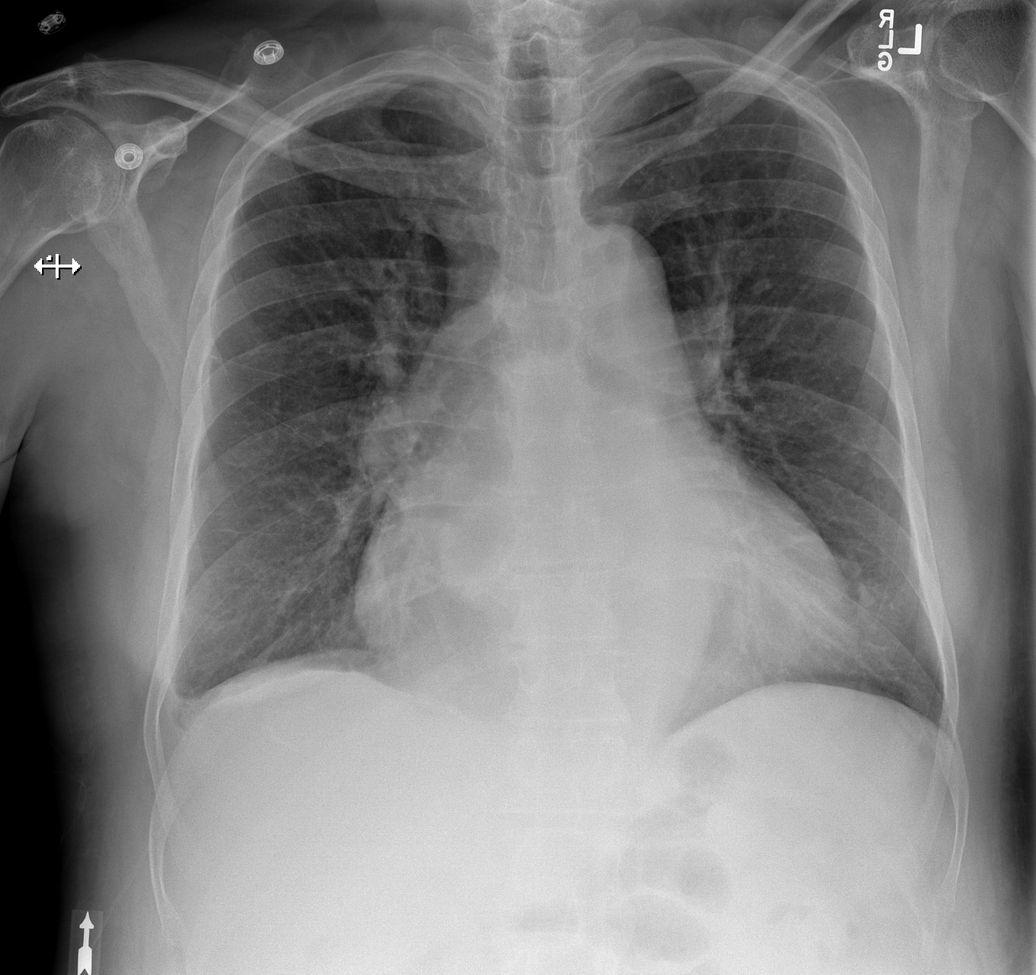

[w abdomen upright]
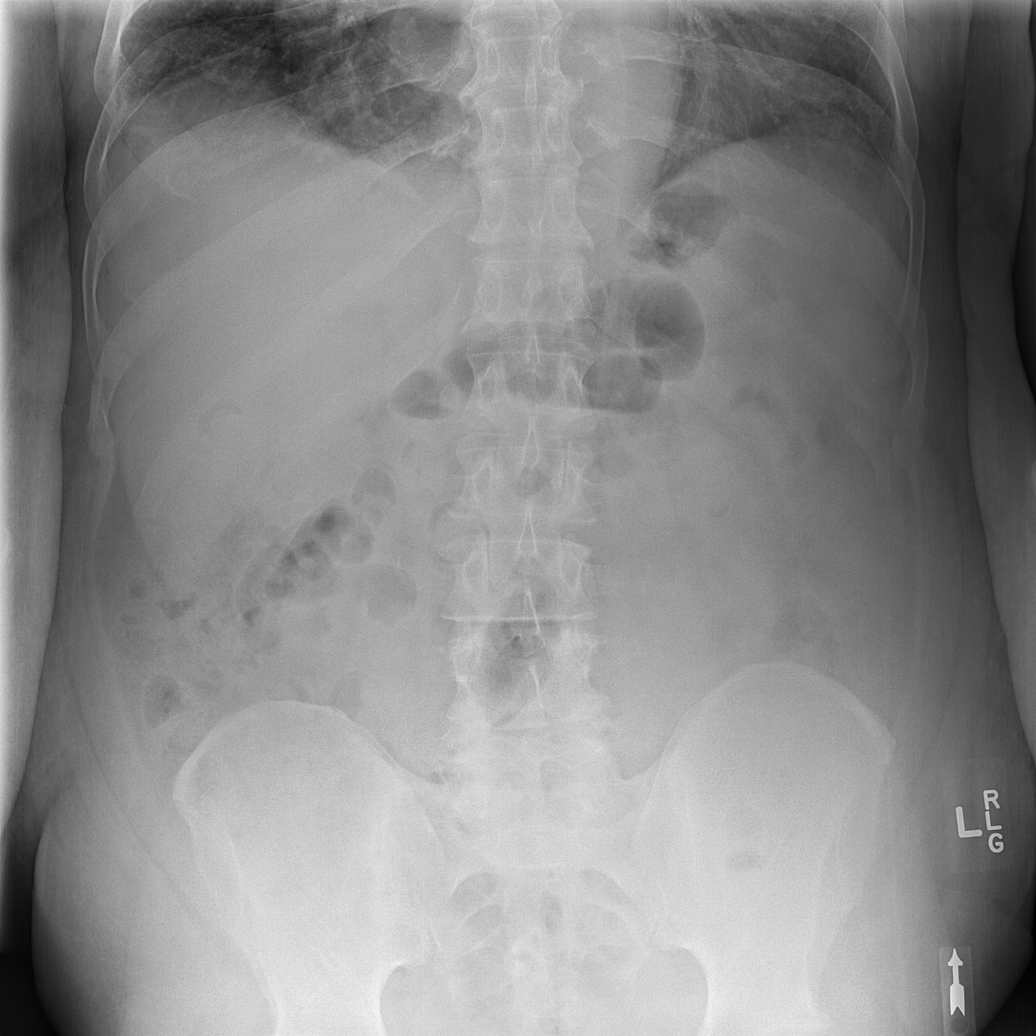

[t abdomen supine (1 of 2)]
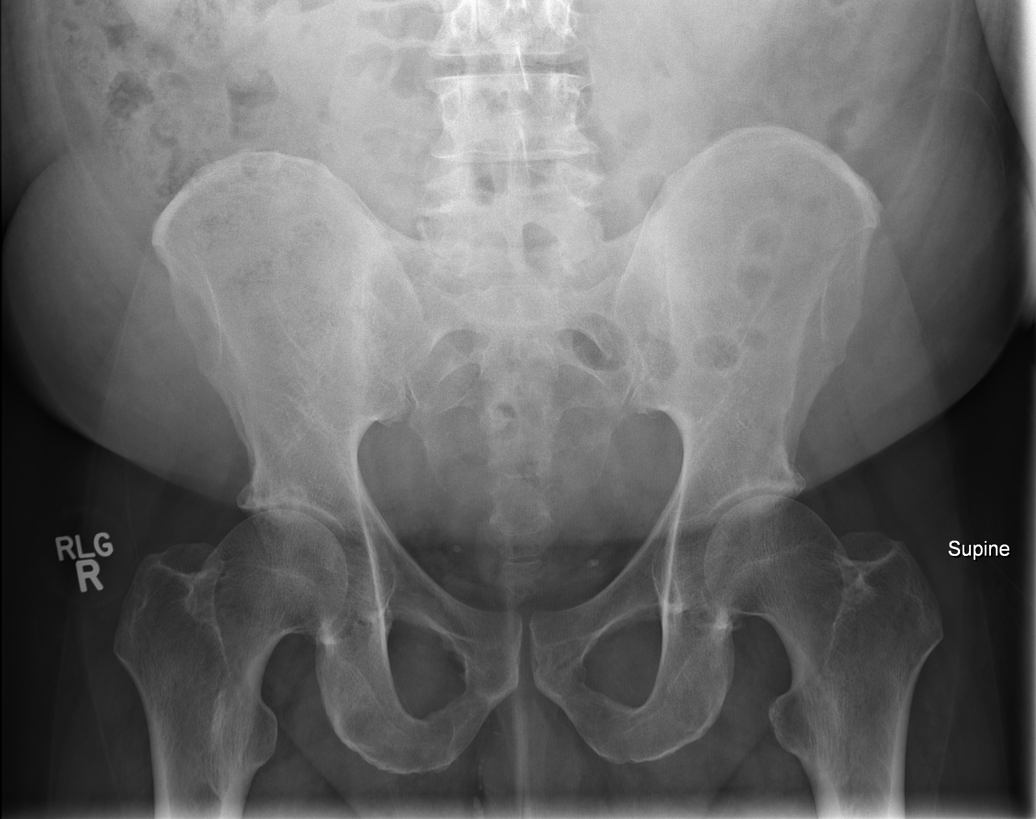

[t abdomen supine (2 of 2)]
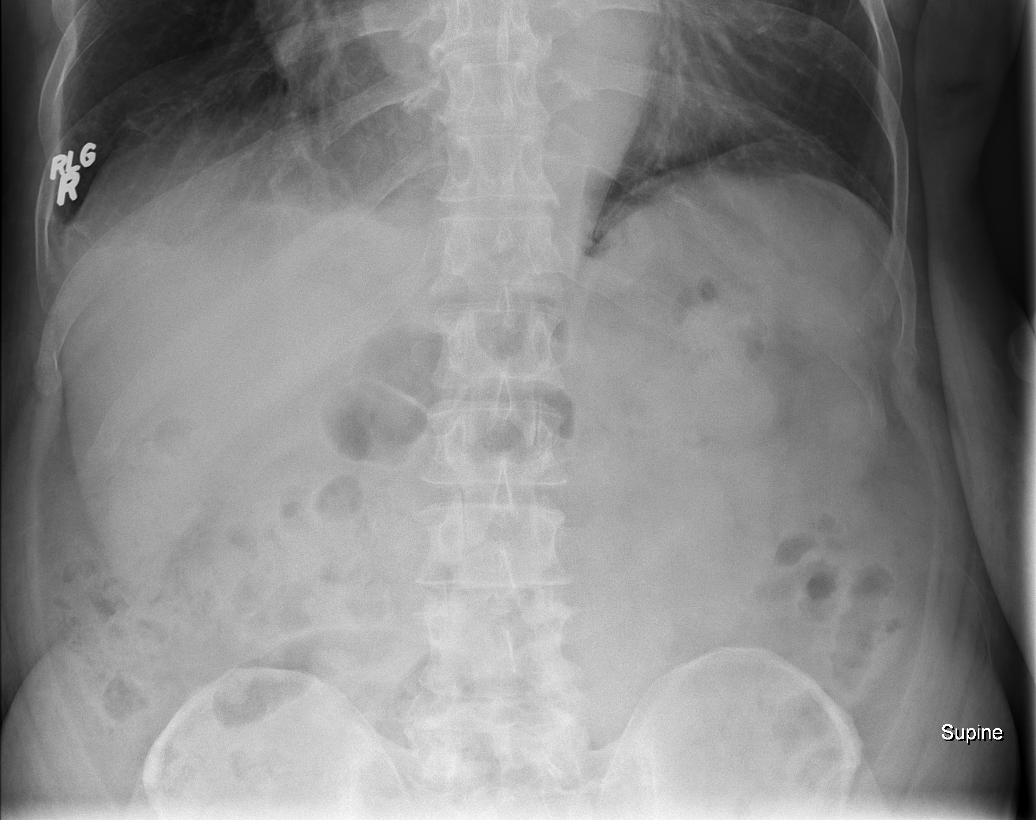

[4 of 4 positions shown; findings below may reference images not displayed]

FINDINGS: Mild cardiomegaly. Tortuous thoracic aorta. Blunting of the right
lateral costophrenic angle suggesting small effusion.

No free air.

Normal bowel gas pattern.

Bilateral pelvic phleboliths.

Regional bones unremarkable.
IMPRESSION: Cardiomegaly with possible small  right pleural effusion.

Normal bowel gas pattern.  No free air.

## 2018-09-26 ENCOUNTER — Encounter: Payer: Self-pay | Admitting: Physician Assistant

## 2018-09-26 ENCOUNTER — Ambulatory Visit (INDEPENDENT_AMBULATORY_CARE_PROVIDER_SITE_OTHER): Payer: Self-pay | Admitting: Physician Assistant

## 2018-09-26 VITALS — BP 122/74 | HR 76 | Ht 73.0 in | Wt 256.2 lb

## 2018-09-26 DIAGNOSIS — J449 Chronic obstructive pulmonary disease, unspecified: Secondary | ICD-10-CM

## 2018-09-26 DIAGNOSIS — I428 Other cardiomyopathies: Secondary | ICD-10-CM

## 2018-09-26 DIAGNOSIS — I340 Nonrheumatic mitral (valve) insufficiency: Secondary | ICD-10-CM

## 2018-09-26 DIAGNOSIS — Z79899 Other long term (current) drug therapy: Secondary | ICD-10-CM

## 2018-09-26 DIAGNOSIS — I10 Essential (primary) hypertension: Secondary | ICD-10-CM

## 2018-09-26 DIAGNOSIS — I5022 Chronic systolic (congestive) heart failure: Secondary | ICD-10-CM

## 2018-09-26 DIAGNOSIS — I480 Paroxysmal atrial fibrillation: Secondary | ICD-10-CM

## 2018-09-26 MED ORDER — SACUBITRIL-VALSARTAN 49-51 MG PO TABS: 1.0000 | ORAL_TABLET | Freq: Two times a day (BID) | ORAL | 3 refills | Status: DC

## 2018-09-26 MED ORDER — APIXABAN 5 MG PO TABS: 5.0000 mg | ORAL_TABLET | Freq: Two times a day (BID) | ORAL | 12 refills | Status: DC

## 2018-09-26 NOTE — Progress Notes (Signed)
Cardiology Office Note    Date:  09/26/2018   ID:  Kevin [name], DOB 25-Sep-1955, MRN [id number]  PCP:  Kallie Locks, FNP  Cardiologist:  Dr. [name] Electrophysiologist: Dr. Elberta Fortis  Chief Complaint  Patient presents with  . Follow-up    seen for Dr. [name]    History of Present Illness:  Kevin [name] is a 63 y.o. male with past medical history of NICM, PAF, COPD, morderate MR and HTN.  He was diagnosed with nonischemic cardiomyopathy in Dec 2016.  Echocardiogram at the time showed EF of 25 to 30%.  Cardiac catheterization showed minimal CAD.  He was admitted at [facility] hospital in February 2018 with progressive dyspnea with exertion and fatigue and found to have atrial fibrillation with RVR.  Echocardiogram showed EF down to 20 to 25% with diffuse hypokinesis.  He was placed on Eliquis and was discharged on carvedilol and the digoxin.  Due to persistently low EF, he was referred to Dr. Elberta Fortis in 2018 for ICD implantation.  However patient had second thoughts and that he did not wish to have ICD implanted.  He was eventually transitioned to Toprol-XL and and Entresto.  Repeat echocardiogram on 05/19/2018 showed EF 55 to 60%, grade 1 DD, mild LAE, PA peak pressure 32 mmHg.  Patient presents today for cardiology office visit.  He has been doing very well on the [place].  He does not have any significant lower extremity edema, for apnea or PND.  He is able to do everything at home without any significant exertional dyspnea or chest discomfort.  He can continue on the current medication and follow-up in 1 year.  Past Medical History:  Diagnosis Date  . Asthma   . Chronic systolic CHF (congestive heart failure) (HCC)    a. 06/2015 Echo: EF 25-30%, mod-sev MR, PASP . b. 08/2016: echo showing EF of 20-25% with diffuse HK  . Cocaine use   . Essential hypertension   . Longstanding persistent atrial fibrillation    a. Initially dx 06/2015, paroxysmal at that time. b. Recurrent in  Feb 2018 and beyond.  . Moderate mitral regurgitation    a. 06/2015 Echo: Mod-Sev MR. b. 08/2016: echo showing moderate MR.   Marland Kitchen NICM (nonischemic cardiomyopathy) (HCC)    a. 06/2015 Echo: EF 25-30%, normal cors by cath - ? Tachy-mediated;    . Non-obstructive CAD    a. 06/2015 Cath: LM nl, LAD 61m, LCX nl, RCA nl, EF 25-30%.  . Tobacco use     Past Surgical History:  Procedure Laterality Date  . CARDIAC CATHETERIZATION N/A 07/26/2015   Procedure: Left Heart Cath and Coronary Angiography;  Surgeon: Corky Crafts, MD;  Location: [facility] INVASIVE CV LAB;  Service: Cardiovascular;  Laterality: N/A;  . CARDIOVERSION N/A 04/09/2017   Procedure: CARDIOVERSION;  Surgeon: Pricilla Riffle, MD;  Location: [facility] ENDOSCOPY;  Service: Cardiovascular;  Laterality: N/A;  . TEE WITHOUT CARDIOVERSION N/A 04/09/2017   Procedure: TRANSESOPHAGEAL ECHOCARDIOGRAM (TEE);  Surgeon: Pricilla Riffle, MD;  Location: [facility] ENDOSCOPY;  Service: Cardiovascular;  Laterality: N/A;    Current Medications: Outpatient Medications Prior to Visit  Medication Sig Dispense Refill  . albuterol (PROVENTIL HFA;VENTOLIN HFA) 108 (90 Base) MCG/ACT inhaler Inhale 2 puffs every 6 (six) hours as needed into the lungs for wheezing or shortness of breath. 1 Inhaler 3  . albuterol (PROVENTIL) (2.5 MG/3ML) 0.083% nebulizer solution Take 3 mLs (2.5 mg total) by nebulization every 6 (six) hours as needed for wheezing or shortness of  breath. 75 mL 12  . furosemide (LASIX) 40 MG tablet Take 1 tablet (40 mg total) by mouth 2 (two) times daily. 180 tablet 3  . lansoprazole (PREVACID) 30 MG capsule Take 30 mg by mouth daily as needed (for heartburn/indigestion).    . metoprolol succinate (TOPROL-XL) 50 MG 24 hr tablet Take 2 tablets (100mg ) in the morning, 1 tablet (50mg ) in the evening with or after meal. 90 tablet 11  . spironolactone (ALDACTONE) 25 MG tablet Take 1/2 to 1 tablet daily as instructed. 30 tablet 11  . apixaban (ELIQUIS) 5 MG TABS tablet  Take 1 tablet (5 mg total) by mouth 2 (two) times daily. 60 tablet 0  . sacubitril-valsartan (ENTRESTO) 49-51 MG Take 1 tablet by mouth 2 (two) times daily.     No facility-administered medications prior to visit.      Allergies:   Patient has no known allergies.   Social History   Socioeconomic History  . Marital status: Single    Spouse name: Not on file  . Number of children: Not on file  . Years of education: Not on file  . Highest education level: Not on file  Occupational History    Employer: [employer]  Social Needs  . Financial resource strain: Not on file  . Food insecurity:    Worry: Not on file    Inability: Not on file  . Transportation needs:    Medical: Not on file    Non-medical: Not on file  Tobacco Use  . Smoking status: Former Smoker    Packs/day: 0.50    Years: 40.00    Pack years: 20.00    Types: Cigarettes  . Smokeless tobacco: Never Used  . Tobacco comment: Quit in 2014  Substance and Sexual Activity  . Alcohol use: Yes    Alcohol/week: 0.0 standard drinks    Comment: 2-3 drinks per week.  . Drug use: Yes    Comment: Not currently. Quit in 2014. 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  . Sexual activity: Never  Lifestyle  . Physical activity:    Days per week: Not on file    Minutes per session: Not on file  . Stress: Not on file  Relationships  . Social connections:    Talks on phone: Not on file    Gets together: Not on file    Attends religious service: Not on file    Active member of club or organization: Not on file    Attends meetings of clubs or organizations: Not on file    Relationship status: Not on file  Other Topics Concern  . Not on file  Social History Narrative  . Not on file     Family History:  The patient's family history includes Hypertension in his father.   ROS:   Please see the history of present illness.    ROS All other systems reviewed and are negative.   PHYSICAL EXAM:   VS:  BP 122/74   Pulse 76   Ht 6\' 1"   (1.854 m)   Wt 256 lb 3.2 oz (116.2 kg)   BMI 33.80 kg/m    GEN: Well nourished, well developed, in no acute distress  HEENT: normal  Neck: no JVD, carotid bruits, or masses Cardiac: RRR; no murmurs, rubs, or gallops,no edema  Respiratory:  clear to auscultation bilaterally, normal work of breathing GI: soft, nontender, nondistended, + BS MS: no deformity or atrophy  Skin: warm and dry, no rash Neuro:  Alert and Oriented x  3, Strength and sensation are intact Psych: euthymic mood, full affect  Wt Readings from Last 3 Encounters:  09/26/18 256 lb 3.2 oz (116.2 kg)  01/18/18 238 lb 8 oz (108.2 kg)  12/07/17 243 lb 9.6 oz (110.5 kg)      Studies/Labs Reviewed:   EKG:  EKG is not ordered today.  He is maintaining sinus rhythm based on physical exam.  Recent Labs: 01/18/2018: BUN 11; Creatinine, Ser 1.00; Potassium 4.6; Sodium 142   Lipid Panel    Component Value Date/Time   CHOL 113 04/07/2017 0628   TRIG 91 04/07/2017 0628   HDL 26 (L) 04/07/2017 0628   CHOLHDL 4.3 04/07/2017 0628   VLDL 18 04/07/2017 0628   LDLCALC 69 04/07/2017 0628    Additional studies/ records that were reviewed today include:   Cath 07/26/2015  Mild nonobstructive CAD, most notably in the LAD.  There is severe left ventricular systolic dysfunction.   Continue aggressive medical therapy to treat his nonischemic cardiomyopathy.      Echo 05/19/2018 LV EF: 55% -   60% Study Conclusions  - Left ventricle: The cavity size was normal. Systolic function was   normal. The estimated ejection fraction was in the range of 55%   to 60%. Regional wall motion abnormalities cannot be excluded,   however, left ventricular function and wall motion are grossly   normal. Doppler parameters are consistent with abnormal left   ventricular relaxation (grade 1 diastolic dysfunction). - Aortic valve: Trileaflet; normal thickness leaflets.   Transvalvular velocity was within the normal range. There was no    stenosis. There was trivial regurgitation. Mean gradient (S): 4   mm Hg. - Mitral valve: Transvalvular velocity was within the normal range.   There was no evidence for stenosis. There was trivial   regurgitation. - Left atrium: The atrium was mildly dilated. - Right ventricle: The cavity size was normal. Wall thickness was   normal. Systolic function was normal. RV systolic pressure (S,   est): 32 mm Hg. - Right atrium: The atrium was normal in size. - Tricuspid valve: There was trivial regurgitation. - Pulmonary arteries: Systolic pressure was mildly increased. PA   peak pressure: 32 mm Hg (S). - Inferior vena cava: The vessel was normal in size. Consistent   with normal central venous pressure. - Pericardium, extracardiac: There was no pericardial effusion.    ASSESSMENT:    1. NICM (nonischemic cardiomyopathy) (HCC)   2. Medication management   3. Chronic systolic heart failure (HCC)   4. PAF (paroxysmal atrial fibrillation) (HCC)   5. Chronic obstructive pulmonary disease, unspecified COPD type (HCC)   6. Nonrheumatic mitral valve regurgitation   7. Essential hypertension      PLAN:  In order of problems listed above:  1. Nonischemic cardiomyopathy with normalization LVEF: Echocardiogram shows normalization of EF by October 2019.  Continue on Toprol-XL and Entresto along with spironolactone  2. PAF: On Eliquis.  No recurrence.  Heart rate very irregular on physical exam  3. COPD: No acute exacerbation  4. Mitral valve regurgitation: Although previous echocardiogram showed moderate MR, however most recent echocardiogram showed trivial MR.  He does not have heart murmur on physical exam  5. Hypertension: Blood pressure stable on current therapy.    Medication Adjustments/Labs and Tests Ordered: Current medicines are reviewed at length with the patient today.  Concerns regarding medicines are outlined above.  Medication changes, Labs and Tests ordered today are listed  in the Patient Instructions below. Patient  Instructions  Medication Instructions:  NO CHANGES- Your physician recommends that you continue on your current medications as directed. Please refer to the Current Medication list given to you today.  If you need a refill on your cardiac medications before your next appointment, please call your pharmacy.  Labwork: BMET AND CBC TODAY HERE IN OUR OFFICE AT [facility]   Take the provided lab slips with you to the lab for your blood draw.    When you have your labs (blood work) drawn today and your tests are completely normal, you will receive your results only by MyChart Message (if you have MyChart) -OR-  A paper copy in the mail.  If you have any lab test that is abnormal or we need to change your treatment, we will call you to review these results.  Follow-Up: You will need a follow up appointment in 12 months.  Please call our office 2 months in advance to schedule this appointment.  You may see Chrystie Nose, MD or one of the following Advanced Practice Providers on your designated Care Team:  Azalee Course, [place]  Micah Flesher, PA-C      At [facility], you and your health needs are our priority.  As part of our continuing mission to provide you with exceptional heart care, we have created designated Provider Care Teams.  These Care Teams include your primary Cardiologist (physician) and Advanced Practice Providers (APPs -  Physician Assistants and Nurse Practitioners) who all work together to provide you with the care you need, when you need it.  Thank you for choosing [facility] at [employer], Azalee Course, [place]  09/26/2018 1:32 PM    [facility] [address] [place], [place], [place]  [address] Phone: [phone]; Fax: [phone]

## 2018-09-26 NOTE — Patient Instructions (Signed)
Medication Instructions:  NO CHANGES- Your physician recommends that you continue on your current medications as directed. Please refer to the Current Medication list given to you today.  If you need a refill on your cardiac medications before your next appointment, please call your pharmacy.  Labwork: BMET AND CBC TODAY HERE IN OUR OFFICE AT [facility]   Take the provided lab slips with you to the lab for your blood draw.    When you have your labs (blood work) drawn today and your tests are completely normal, you will receive your results only by MyChart Message (if you have MyChart) -OR-  A paper copy in the mail.  If you have any lab test that is abnormal or we need to change your treatment, we will call you to review these results.  Follow-Up: You will need a follow up appointment in 12 months.  Please call our office 2 months in advance to schedule this appointment.  You may see Chrystie Nose, MD or one of the following Advanced Practice Providers on your designated Care Team:  Azalee Course, [place]  Micah Flesher, PA-C      At [facility], you and your health needs are our priority.  As part of our continuing mission to provide you with exceptional heart care, we have created designated Provider Care Teams.  These Care Teams include your primary Cardiologist (physician) and Advanced Practice Providers (APPs -  Physician Assistants and Nurse Practitioners) who all work together to provide you with the care you need, when you need it.  Thank you for choosing [facility] at [facility]!!

## 2018-09-27 LAB — CBC
HEMOGLOBIN: 14.2 g/dL (ref 13.0–17.7)
Hematocrit: 42.2 % (ref 37.5–51.0)
MCH: 26.3 pg — ABNORMAL LOW (ref 26.6–33.0)
MCHC: 33.6 g/dL (ref 31.5–35.7)
MCV: 78 fL — ABNORMAL LOW (ref 79–97)
PLATELETS: 205 10*3/uL (ref 150–450)
RBC: 5.39 x10E6/uL (ref 4.14–5.80)
RDW: 15.2 % (ref 11.6–15.4)
WBC: 7.2 10*3/uL (ref 3.4–10.8)

## 2018-09-27 LAB — BASIC METABOLIC PANEL
BUN/Creatinine Ratio: 10 (ref 10–24)
BUN: 12 mg/dL (ref 8–27)
CALCIUM: 9.7 mg/dL (ref 8.6–10.2)
CHLORIDE: 102 mmol/L (ref 96–106)
CO2: 25 mmol/L (ref 20–29)
Creatinine, Ser: 1.24 mg/dL (ref 0.76–1.27)
GFR calc Af Amer: 71 mL/min/{1.73_m2} (ref >59)
GFR calc non Af Amer: 61 mL/min/{1.73_m2} (ref >59)
Glucose: 102 mg/dL — ABNORMAL HIGH (ref 65–99)
POTASSIUM: 4.5 mmol/L (ref 3.5–5.2)
Sodium: 143 mmol/L (ref 134–144)

## 2018-11-21 IMAGING — DX DG CHEST 2V
2 series · 2 of 2 positions shown · non-contrast
Comparison: Chest x-ray of April 04, 2017

CLINICAL DATA: History of asthma -COPD, CHF, atrial fibrillation,
former smoker. The patient is reporting shortness of breath.

EXAM:
CHEST  2 VIEW

[chest pa]
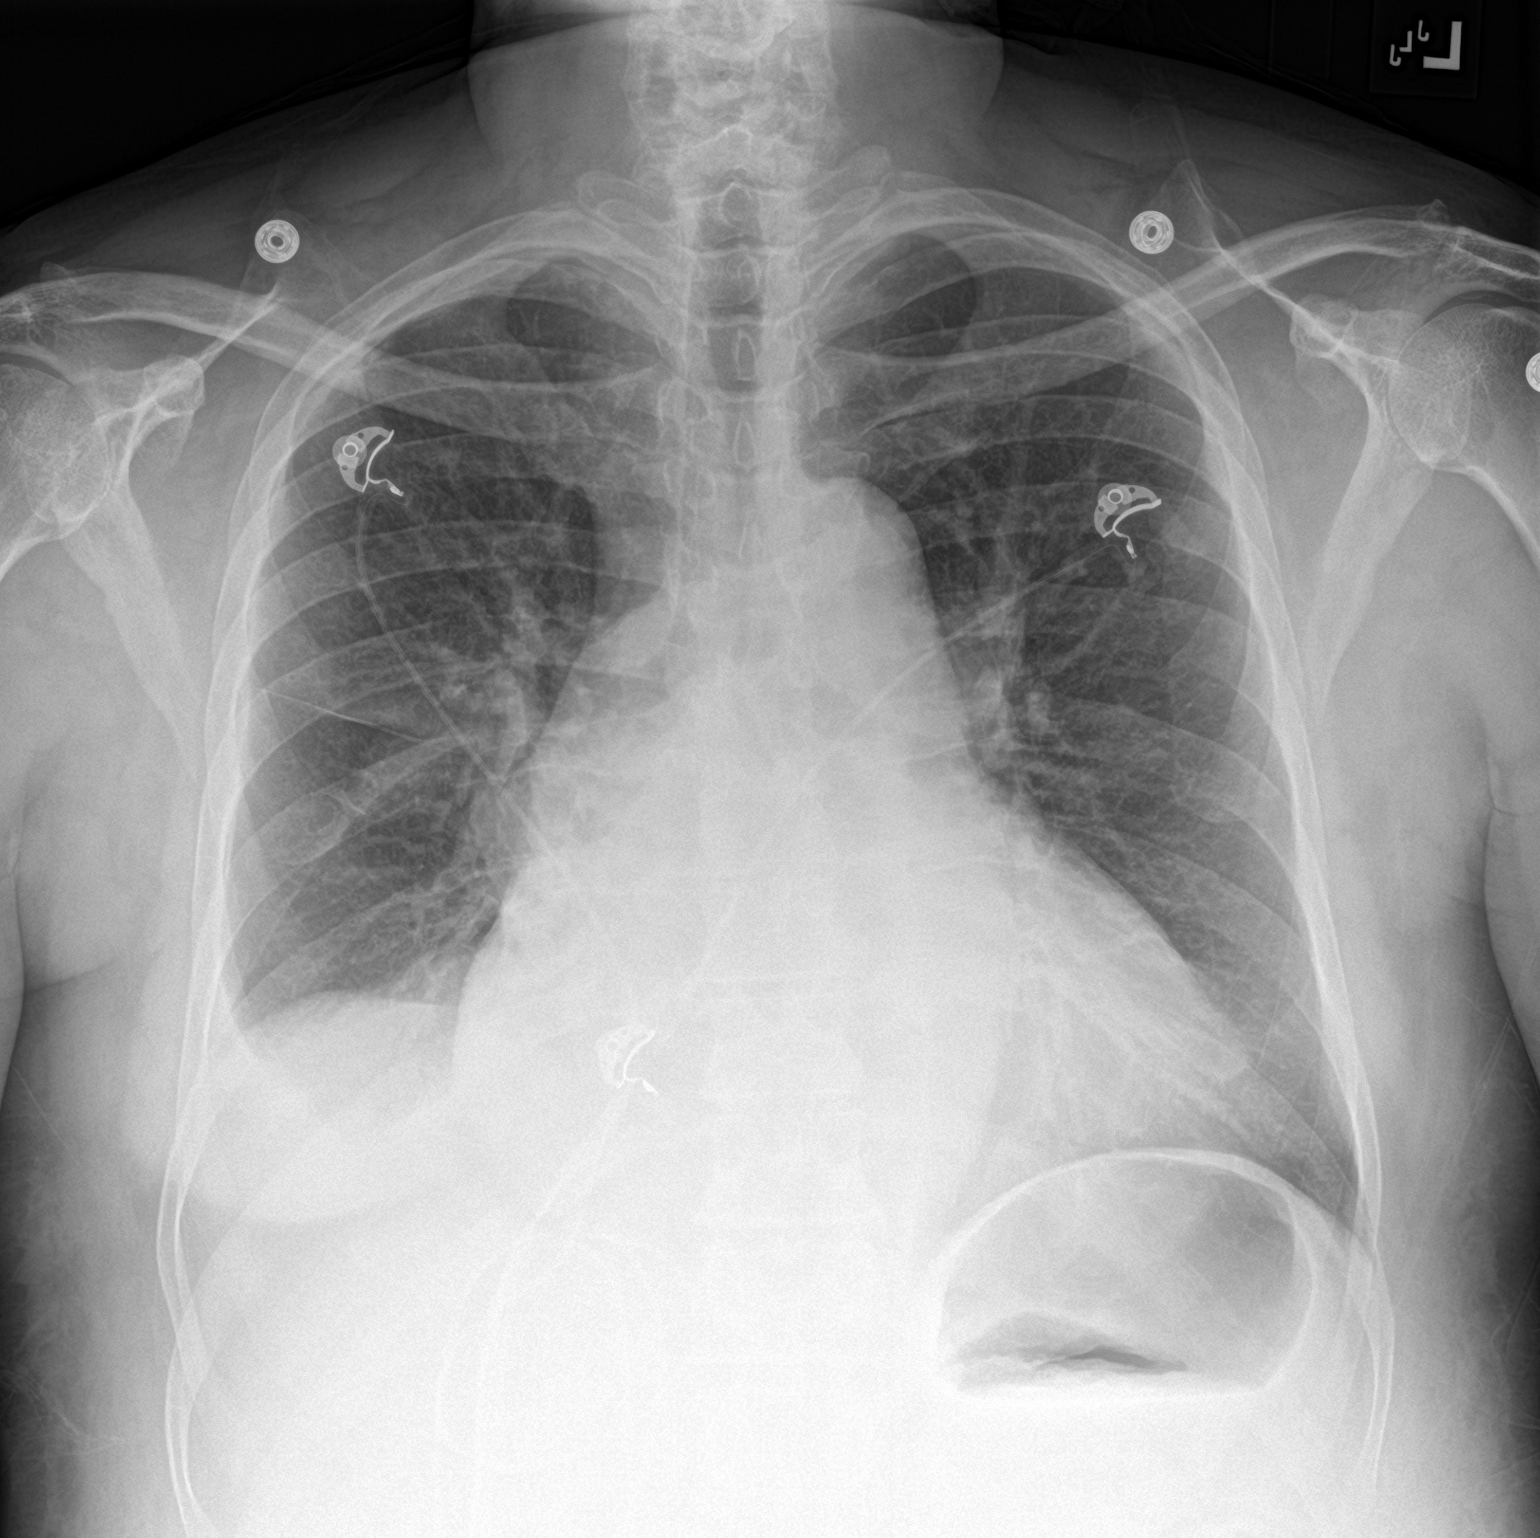

[chest lat]
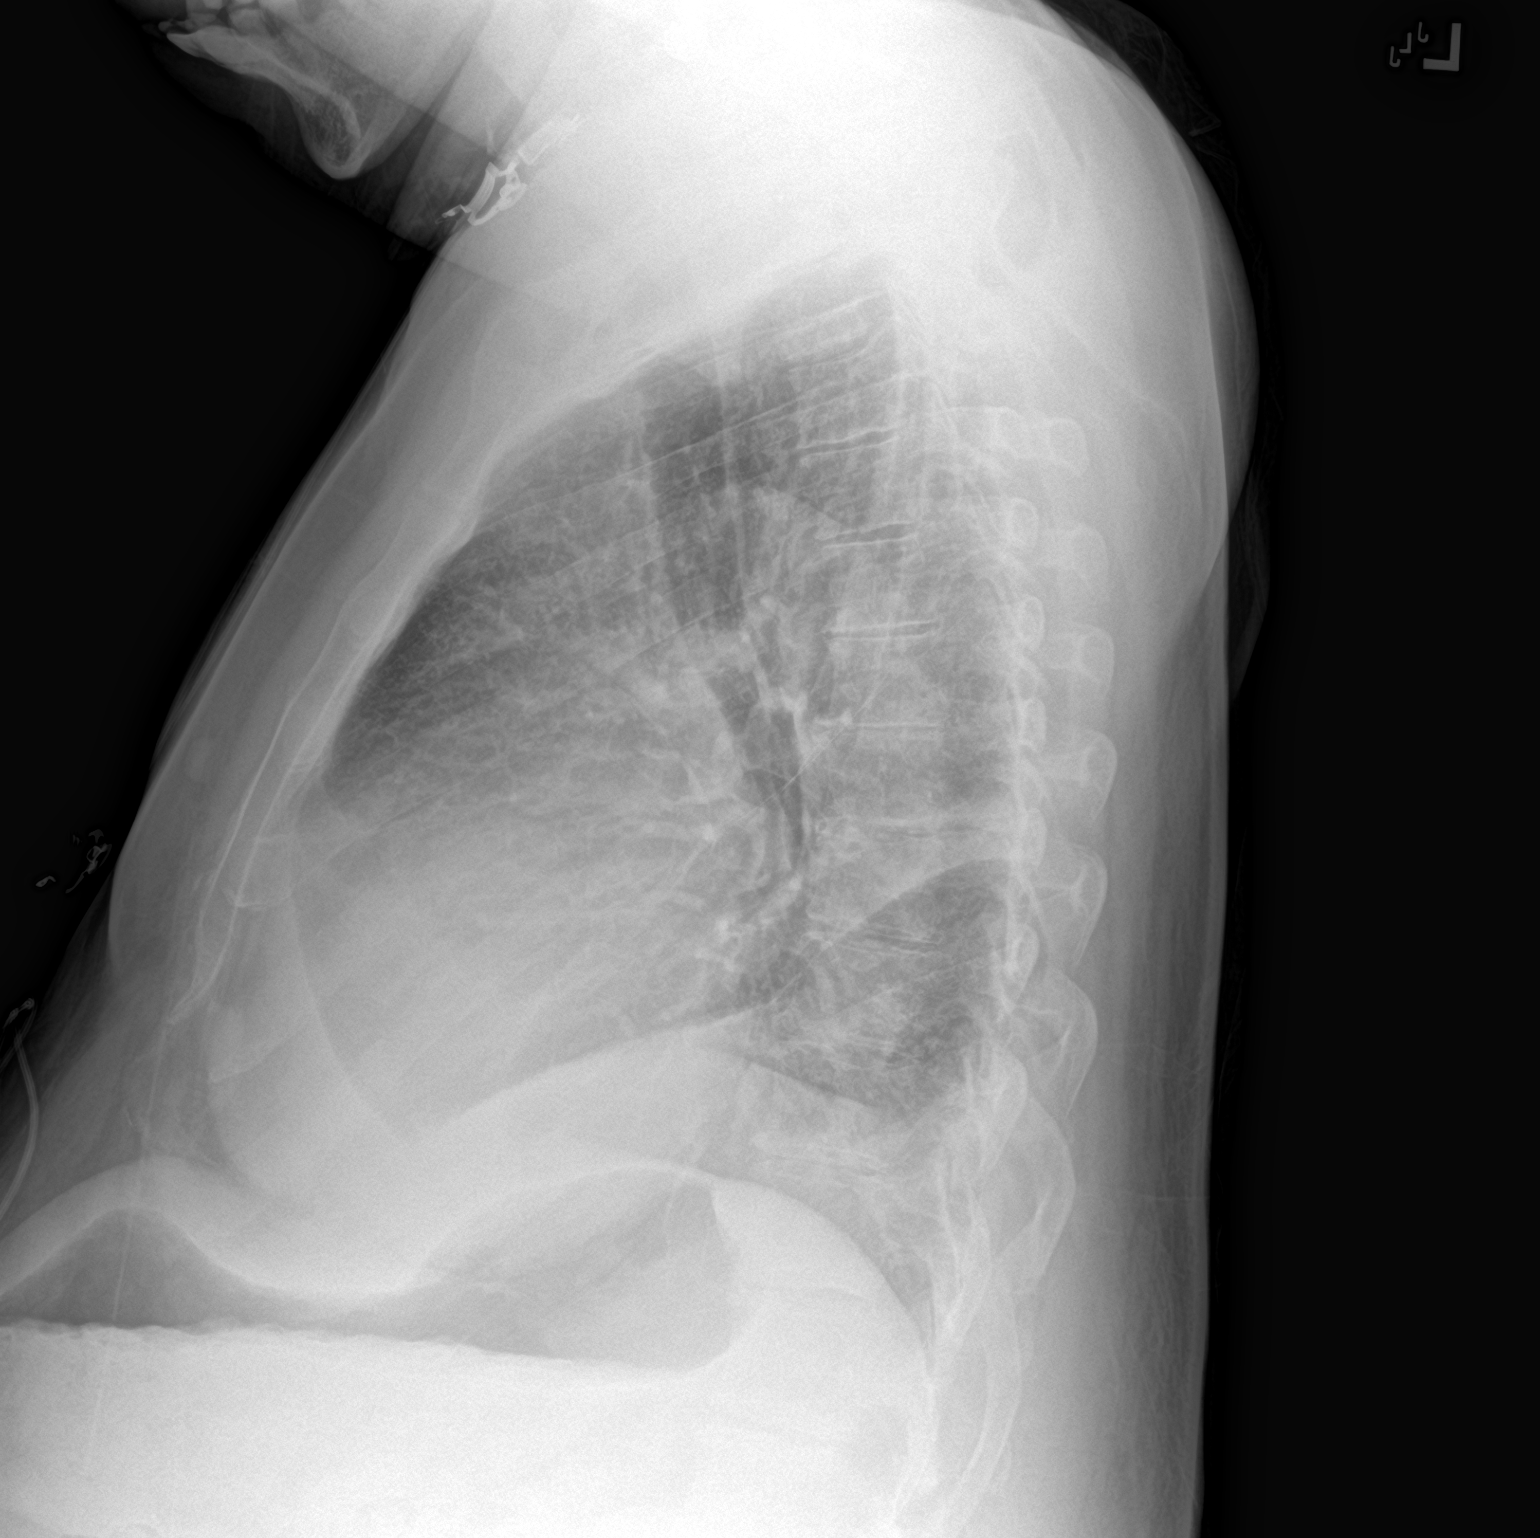

[2 of 2 positions shown; findings below may reference images not displayed]

FINDINGS: Do left lung is well-expanded and clear. On the right there remains
hazy increased density at the lung base. The cardiac silhouette
remains enlarged the pulmonary vascularity engorged. The trachea is
midline. The bony thorax exhibits no acute abnormality.
IMPRESSION: Improving CHF.  Improving right basilar atelectasis or pneumonia.

## 2018-12-06 ENCOUNTER — Other Ambulatory Visit: Payer: Self-pay

## 2018-12-06 ENCOUNTER — Ambulatory Visit (INDEPENDENT_AMBULATORY_CARE_PROVIDER_SITE_OTHER): Payer: Self-pay | Admitting: Family Medicine

## 2018-12-06 DIAGNOSIS — I10 Essential (primary) hypertension: Secondary | ICD-10-CM

## 2018-12-06 DIAGNOSIS — I509 Heart failure, unspecified: Secondary | ICD-10-CM

## 2018-12-06 DIAGNOSIS — Z09 Encounter for follow-up examination after completed treatment for conditions other than malignant neoplasm: Secondary | ICD-10-CM

## 2018-12-06 NOTE — Progress Notes (Signed)
Virtual Visit via Telephone Note  I connected with Kevin [name] on 12/07/18 at  2:00 PM EDT by telephone and verified that I am speaking with the correct person using two identifiers.   I discussed the limitations, risks, security and privacy concerns of performing an evaluation and management service by telephone and the availability of in person appointments. I also discussed with the patient that there may be a patient responsible charge related to this service. The patient expressed understanding and agreed to proceed.  History of Present Illness:  Past Medical History:  Diagnosis Date  . Asthma   . Chronic systolic CHF (congestive heart failure) (HCC)    a. 06/2015 Echo: EF 25-30%, mod-sev MR, PASP . b. 08/2016: echo showing EF of 20-25% with diffuse HK  . Cocaine use   . Essential hypertension   . Longstanding persistent atrial fibrillation    a. Initially dx 06/2015, paroxysmal at that time. b. Recurrent in Feb 2018 and beyond.  . Moderate mitral regurgitation    a. 06/2015 Echo: Mod-Sev MR. b. 08/2016: echo showing moderate MR.   Marland Kitchen NICM (nonischemic cardiomyopathy) (HCC)    a. 06/2015 Echo: EF 25-30%, normal cors by cath - ? Tachy-mediated;    . Non-obstructive CAD    a. 06/2015 Cath: LM nl, LAD 78m, LCX nl, RCA nl, EF 25-30%.  . Tobacco use     Current Outpatient Medications on File Prior to Visit  Medication Sig Dispense Refill  . albuterol (PROVENTIL HFA;VENTOLIN HFA) 108 (90 Base) MCG/ACT inhaler Inhale 2 puffs every 6 (six) hours as needed into the lungs for wheezing or shortness of breath. 1 Inhaler 3  . albuterol (PROVENTIL) (2.5 MG/3ML) 0.083% nebulizer solution Take 3 mLs (2.5 mg total) by nebulization every 6 (six) hours as needed for wheezing or shortness of breath. 75 mL 12  . apixaban (ELIQUIS) 5 MG TABS tablet Take 1 tablet (5 mg total) by mouth 2 (two) times daily. 60 tablet 12  . furosemide (LASIX) 40 MG tablet Take 1 tablet (40 mg total) by mouth 2 (two)  times daily. 180 tablet 3  . lansoprazole (PREVACID) 30 MG capsule Take 30 mg by mouth daily as needed (for heartburn/indigestion).    . metoprolol succinate (TOPROL-XL) 50 MG 24 hr tablet Take 2 tablets (100mg ) in the morning, 1 tablet (50mg ) in the evening with or after meal. 90 tablet 11  . sacubitril-valsartan (ENTRESTO) 49-51 MG Take 1 tablet by mouth 2 (two) times daily. 180 tablet 3  . spironolactone (ALDACTONE) 25 MG tablet Take 1/2 to 1 tablet daily as instructed. (Patient not taking: Reported on 12/06/2018) 30 tablet 11   No current facility-administered medications on file prior to visit.     Current Status: Since his last office visit, he is doing well with no complaints. Today, is my initiate follow up with him. He states that he has been following up with his Cardiologist regularly. He denies visual changes, chest pain, cough, shortness of breath, heart palpitations, and falls. He has occasional headaches and dizziness with position changes. Denies severe headaches, confusion, seizures, double vision, and blurred vision, nausea and vomiting.  He denies fevers, chills, fatigue, recent infections, weight loss, and night sweats. No reports of GI problems such as diarrhea, and constipation. He has no reports of blood in stools, dysuria and hematuria. No depression or anxiety reported today He denies pain today.   Observations/Objective:  Telephone Virtual Visit.    Assessment and Plan:  1. Congestive heart failure, unspecified  HF chronicity, unspecified heart failure type (HCC) No signs or symptoms of respiratory distress reported. Continue inhaler a prescribed.   2. Essential hypertension She will continue to decrease high sodium intake, excessive alcohol intake, increase potassium intake, smoking cessation, and increase physical activity of at least 30 minutes of cardio activity daily. She will continue to follow Heart Healthy or DASH diet.  Follow Up Instructions:  He will  follow up in 2 weeks.    I discussed the assessment and treatment plan with the patient. The patient was provided an opportunity to ask questions and all were answered. The patient agreed with the plan and demonstrated an understanding of the instructions.   The patient was advised to call back or seek an in-person evaluation if the symptoms worsen or if the condition fails to improve as anticipated.  I provided 20 minutes of non-face-to-face time during this encounter.   Kallie Locks, FNP

## 2018-12-20 ENCOUNTER — Other Ambulatory Visit: Payer: Self-pay

## 2018-12-20 ENCOUNTER — Encounter: Payer: Self-pay | Admitting: Family Medicine

## 2018-12-20 ENCOUNTER — Ambulatory Visit (INDEPENDENT_AMBULATORY_CARE_PROVIDER_SITE_OTHER): Payer: Self-pay | Admitting: Family Medicine

## 2018-12-20 VITALS — BP 126/78 | HR 84 | Temp 98.0°F | Ht 73.0 in | Wt 259.0 lb

## 2018-12-20 DIAGNOSIS — Z09 Encounter for follow-up examination after completed treatment for conditions other than malignant neoplasm: Secondary | ICD-10-CM

## 2018-12-20 DIAGNOSIS — R0981 Nasal congestion: Secondary | ICD-10-CM

## 2018-12-20 DIAGNOSIS — I509 Heart failure, unspecified: Secondary | ICD-10-CM

## 2018-12-20 DIAGNOSIS — Z131 Encounter for screening for diabetes mellitus: Secondary | ICD-10-CM

## 2018-12-20 DIAGNOSIS — Z7901 Long term (current) use of anticoagulants: Secondary | ICD-10-CM

## 2018-12-20 DIAGNOSIS — J302 Other seasonal allergic rhinitis: Secondary | ICD-10-CM

## 2018-12-20 DIAGNOSIS — R140 Abdominal distension (gaseous): Secondary | ICD-10-CM

## 2018-12-20 DIAGNOSIS — J45909 Unspecified asthma, uncomplicated: Secondary | ICD-10-CM

## 2018-12-20 DIAGNOSIS — I4819 Other persistent atrial fibrillation: Secondary | ICD-10-CM

## 2018-12-20 DIAGNOSIS — I10 Essential (primary) hypertension: Secondary | ICD-10-CM

## 2018-12-20 LAB — POCT GLYCOSYLATED HEMOGLOBIN (HGB A1C): Hemoglobin A1C: 6.3 % — AB (ref 4.0–5.6)

## 2018-12-20 MED ORDER — AMOXICILLIN-POT CLAVULANATE 875-125 MG PO TABS: 1.0000 | ORAL_TABLET | Freq: Two times a day (BID) | ORAL | 0 refills | Status: AC

## 2018-12-20 MED ORDER — CETIRIZINE HCL 10 MG PO CAPS: 10.0000 mg | ORAL_CAPSULE | Freq: Every day | ORAL | 6 refills | Status: DC

## 2018-12-20 MED ORDER — MONTELUKAST SODIUM 10 MG PO TABS: 10.0000 mg | ORAL_TABLET | Freq: Every day | ORAL | 3 refills | Status: DC

## 2018-12-20 NOTE — Patient Instructions (Signed)
Asthma, Adult    Asthma is a long-term (chronic) condition in which the airways get tight and narrow. The airways are the breathing passages that lead from the nose and mouth down into the lungs. A person with asthma will have times when symptoms get worse. These are called asthma attacks. They can cause coughing, whistling sounds when you breathe (wheezing), shortness of breath, and chest pain. They can make it hard to breathe. There is no cure for asthma, but medicines and lifestyle changes can help control it.  There are many things that can bring on an asthma attack or make asthma symptoms worse (triggers). Common triggers include:  · Mold.  · Dust.  · Cigarette smoke.  · Cockroaches.  · Things that can cause allergy symptoms (allergens). These include animal skin flakes (dander) and pollen from trees or grass.  · Things that pollute the air. These may include household cleaners, wood smoke, smog, or chemical odors.  · Cold air, weather changes, and wind.  · Crying or laughing hard.  · Stress.  · Certain medicines or drugs.  · Certain foods such as dried fruit, potato chips, and grape juice.  · Infections, such as a cold or the flu.  · Certain medical conditions or diseases.  · Exercise or tiring activities.  Asthma may be treated with medicines and by staying away from the things that cause asthma attacks. Types of medicines may include:  · Controller medicines. These help prevent asthma symptoms. They are usually taken every day.  · Fast-acting reliever or rescue medicines. These quickly relieve asthma symptoms. They are used as needed and provide short-term relief.  · Allergy medicines if your attacks are brought on by allergens.  · Medicines to help control the body's defense (immune) system.  Follow these instructions at home:  Avoiding triggers in your home  · Change your heating and air conditioning filter often.  · Limit your use of fireplaces and wood stoves.  · Get rid of pests (such as roaches and  mice) and their droppings.  · Throw away plants if you see mold on them.  · Clean your floors. Dust regularly. Use cleaning products that do not smell.  · Have someone vacuum when you are not home. Use a vacuum cleaner with a HEPA filter if possible.  · Replace carpet with wood, tile, or vinyl flooring. Carpet can trap animal skin flakes and dust.  · Use allergy-proof pillows, mattress covers, and box spring covers.  · Wash bed sheets and blankets every week in hot water. Dry them in a dryer.  · Keep your bedroom free of any triggers.   · Avoid pets and keep windows closed when things that cause allergy symptoms are in the air.  · Use blankets that are made of polyester or cotton.  · Clean bathrooms and kitchens with bleach. If possible, have someone repaint the walls in these rooms with mold-resistant paint. Keep out of the rooms that are being cleaned and painted.  · Wash your hands often with soap and water. If soap and water are not available, use hand sanitizer.  · Do not allow anyone to smoke in your home.  General instructions  · Take over-the-counter and prescription medicines only as told by your doctor.  ? Talk with your doctor if you have questions about how or when to take your medicines.  ? Make note if you need to use your medicines more often than usual.  · Do not use any products that   when doing outdoor activities in the winter. The mask should cover your nose and mouth. Exercise indoors on cold days if you can.  Warm up before you exercise. Take time to cool down after exercise.  Use a peak flow meter as told by your doctor. A peak flow meter is a tool that measures how well the lungs are working.  Keep track of the peak flow meter's readings.  Write them down.  Follow your asthma action plan. This is a written plan for taking care of your asthma and treating your attacks.  Make sure you get all the shots (vaccines) that your doctor recommends. Ask your doctor about a flu shot and a pneumonia shot.  Keep all follow-up visits as told by your doctor. This is important. Contact a doctor if:  You have wheezing, shortness of breath, or a cough even while taking medicine to prevent attacks.  The mucus you cough up (sputum) is thicker than usual.  The mucus you cough up changes from clear or white to yellow, green, gray, or bloody.  You have problems from the medicine you are taking, such as: ? A rash. ? Itching. ? Swelling. ? Trouble breathing.  You need reliever medicines more than 2-3 times a week.  Your peak flow reading is still at 50-79% of your personal best after following the action plan for 1 hour.  You have a fever. Get help right away if:  You seem to be worse and are not responding to medicine during an asthma attack.  You are short of breath even at rest.  You get short of breath when doing very little activity.  You have trouble eating, drinking, or talking.  You have chest pain or tightness.  You have a fast heartbeat.  Your lips or fingernails start to turn blue.  You are light-headed or dizzy, or you faint.  Your peak flow is less than 50% of your personal best.  You feel too tired to breathe normally. Summary  Asthma is a long-term (chronic) condition in which the airways get tight and narrow. An asthma attack can make it hard to breathe.  Asthma cannot be cured, but medicines and lifestyle changes can help control it.  Make sure you understand how to avoid triggers and how and when to use your medicines. This information is not intended to replace advice given to you by your health care provider. Make sure you discuss any questions you have with your health care provider. Document  Released: 12/30/2007 Document Revised: 08/17/2016 Document Reviewed: 08/17/2016 Elsevier Interactive Patient Education  2019 [employer] Montelukast oral tablets What is this medicine? MONTELUKAST (mon te LOO kast) is used to prevent and treat the symptoms of asthma. It is also used to treat allergies. Do not use for an acute asthma attack. This medicine may be used for other purposes; ask your health care provider or pharmacist if you have questions. COMMON BRAND NAME(S): Singulair What should I tell my health care provider before I take this medicine? They need to know if you have any of these conditions: -liver disease -an unusual or allergic reaction to montelukast, other medicines, foods, dyes, or preservatives -pregnant or trying to get pregnant -breast-feeding How should I use this medicine? This medicine should be given by mouth. Follow the directions on the prescription label. Take this medicine at the same time every day. You may take this medicine with or without meals. Do not chew the tablets. Do not stop taking your medicine unless  your doctor tells you to. Talk to your pediatrician regarding the use of this medicine in children. Special care may be needed. While this drug may be prescribed for children as young as 60 years of age for selected conditions, precautions do apply. Overdosage: If you think you have taken too much of this medicine contact a poison control center or emergency room at once. NOTE: This medicine is only for you. Do not share this medicine with others. What if I miss a dose? If you miss a dose, take it as soon as you can. If it is almost time for your next dose, take only that dose. Do not take double or extra doses. What may interact with this medicine? -anti-infectives like rifampin and rifabutin -medicines for seizures like phenytoin, phenobarbital, and carbamazepine This list may not describe all possible interactions. Give your health care provider a  list of all the medicines, herbs, non-prescription drugs, or dietary supplements you use. Also tell them if you smoke, drink alcohol, or use illegal drugs. Some items may interact with your medicine. What should I watch for while using this medicine? Visit your doctor or health care professional for regular checks on your progress. Tell your doctor or health care professional if your allergy or asthma symptoms do not improve. Take your medicine even when you do not have symptoms. Do not stop taking any of your medicine(s) unless your doctor tells you to. If you have asthma, talk to your doctor about what to do in an acute asthma attack. Always have your inhaled rescue medicine for asthma attacks with you. Patients and their families should watch for new or worsening thoughts of suicide or depression. Also watch for sudden changes in feelings such as feeling anxious, agitated, panicky, irritable, hostile, aggressive, impulsive, severely restless, overly excited and hyperactive, or not being able to sleep. Any worsening of mood or thoughts of suicide or dying should be reported to your health care professional right away. What side effects may I notice from receiving this medicine? Side effects that you should report to your doctor or health care professional as soon as possible: -allergic reactions like skin rash or hives, or swelling of the face, lips, or tongue -breathing problems -changes in emotions or moods -confusion -depressed mood -fever or infection -hallucinations -joint pain -painful lumps under the skin -pain, tingling, numbness in the hands or feet -redness, blistering, peeling, or loosening of the skin, including inside the mouth -restlessness -seizures -sleep walking -signs and symptoms of infection like fever; chills; cough; sore throat; flu-like illness -signs and symptoms of liver injury like dark yellow or brown urine; general ill feeling or flu-like symptoms; light-colored  stools; loss of appetite; nausea; right upper belly pain; unusually weak or tired; yellowing of the eyes or skin -sinus pain or swelling -stuttering -suicidal thoughts or other mood changes -tremors -trouble sleeping -uncontrolled muscle movements -unusual bleeding or bruising -vivid or bad dreams Side effects that usually do not require medical attention (report to your doctor or health care professional if they continue or are bothersome): -dizziness -drowsiness -headache -runny nose -stomach upset -tiredness This list may not describe all possible side effects. Call your doctor for medical advice about side effects. You may report side effects to FDA at 1-800-FDA-1088. Where should I keep my medicine? Keep out of the reach of children. Store at room temperature between 15 and 30 degrees C (59 and 86 degrees F). Protect from light and moisture. Keep this medicine in the original bottle. Throw  away any unused medicine after the expiration date. NOTE: This sheet is a summary. It may not cover all possible information. If you have questions about this medicine, talk to your doctor, pharmacist, or health care provider.  2019 [employer] Standard (2018-03-08 13:51:04) Cetirizine tablets What is this medicine? CETIRIZINE (se TI ra zeen) is an antihistamine. This medicine is used to treat or prevent symptoms of allergies. It is also used to help reduce itchy skin rash and hives. This medicine may be used for other purposes; ask your health care provider or pharmacist if you have questions. COMMON BRAND NAME(S): All Day Allergy, Allergy Relief, Zyrtec, Zyrtec Hives Relief What should I tell my health care provider before I take this medicine? They need to know if you have any of these conditions: -kidney disease -liver disease -an unusual or allergic reaction to cetirizine, hydroxyzine, other medicines, foods, dyes, or preservatives -pregnant or trying to get pregnant -breast-feeding  How should I use this medicine? Take this medicine by mouth with a glass of water. Follow the directions on the prescription label. You can take this medicine with food or on an empty stomach. Take your medicine at regular times. Do not take more often than directed. You may need to take this medicine for several days before your symptoms improve. Talk to your pediatrician regarding the use of this medicine in children. Special care may be needed. While this drug may be prescribed for children as young as 856 years of age for selected conditions, precautions do apply. Overdosage: If you think you have taken too much of this medicine contact a poison control center or emergency room at once. NOTE: This medicine is only for you. Do not share this medicine with others. What if I miss a dose? If you miss a dose, take it as soon as you can. If it is almost time for your next dose, take only that dose. Do not take double or extra doses. What may interact with this medicine? -alcohol -certain medicines for anxiety or sleep -narcotic medicines for pain -other medicines for colds or allergies This list may not describe all possible interactions. Give your health care provider a list of all the medicines, herbs, non-prescription drugs, or dietary supplements you use. Also tell them if you smoke, drink alcohol, or use illegal drugs. Some items may interact with your medicine. What should I watch for while using this medicine? Visit your doctor or health care professional for regular checks on your health. Tell your doctor if your symptoms do not improve. You may get drowsy or dizzy. Do not drive, use machinery, or do anything that needs mental alertness until you know how this medicine affects you. Do not stand or sit up quickly, especially if you are an older patient. This reduces the risk of dizzy or fainting spells. Your mouth may get dry. Chewing sugarless gum or sucking hard candy, and drinking plenty of  water may help. Contact your doctor if the problem does not go away or is severe. What side effects may I notice from receiving this medicine? Side effects that you should report to your doctor or health care professional as soon as possible: -allergic reactions like skin rash, itching or hives, swelling of the face, lips, or tongue -changes in vision or hearing -fast or irregular heartbeat -trouble passing urine or change in the amount of urine Side effects that usually do not require medical attention (report to your doctor or health care professional if they continue or are  bothersome): -dizziness -dry mouth -irritability -sore throat -stomach pain -tiredness This list may not describe all possible side effects. Call your doctor for medical advice about side effects. You may report side effects to FDA at 1-800-FDA-1088. Where should I keep my medicine? Keep out of the reach of children. Store at room temperature between 15 and 30 degrees C (59 and 86 degrees F). Throw away any unused medicine after the expiration date. NOTE: This sheet is a summary. It may not cover all possible information. If you have questions about this medicine, talk to your doctor, pharmacist, or health care provider.  2019 Elsevier/Gold Standard (2014-08-07 13:44:42) Allergies, Adult An allergy means that your body reacts to something that bothers it (allergen). It is not a normal reaction. This can happen from something that you:  Eat.  Breathe in.  Touch. You can have an allergy (be allergic) to:  Outdoor things, like: ? Pollen. ? Grass. ? Weeds.  Indoor things, like: ? Dust. ? Smoke. ? Pet dander.  Foods.  Medicines.  Things that bother your skin, like: ? Detergents. ? Chemicals. ? Latex.  Perfume.  Bugs. An allergy cannot spread from person to person (is not contagious). Follow these instructions at home:         Stay away from things that you know you are allergic to.  If  you have allergies to things in the air, wash out your nose each day. Do it with one of these: ? A salt-water (saline) spray. ? A container (neti pot).  Take over-the-counter and prescription medicines only as told by your doctor.  Keep all follow-up visits as told by your doctor. This is important.  If you are at risk for a very bad allergy reaction (anaphylaxis), keep an auto-injector with you all the time. This is called an epinephrine injection. ? This is pre-measured medicine with a needle. You can put it into your skin by yourself. ? Right after you have a very bad allergy reaction, you or a person with you must give the medicine in less than a few minutes. This is an emergency.  If you have ever had a very bad allergy reaction, wear a medical alert bracelet or necklace. Your very bad allergy should be written on it. Contact a health care provider if:  Your symptoms do not get better with treatment. Get help right away if:  You have symptoms of a very bad allergy reaction. These include: ? A swollen mouth, tongue, or throat. ? Pain or tightness in your chest. ? Trouble breathing. ? Being short of breath. ? Dizziness. ? Fainting. ? Very bad pain in your belly (abdomen). ? Throwing up (vomiting). ? Watery poop (diarrhea). Summary  An allergy means that your body reacts to something that bothers it (allergen). It is not a normal reaction.  Stay away from things that make your body react.  Take over-the-counter and prescription medicines only as told by your doctor.  If you are at risk for a very bad allergy reaction, carry an auto-injector (epinephrine injection) all the time. Also, wear a medical alert bracelet or necklace so people know about your allergy. This information is not intended to replace advice given to you by your health care provider. Make sure you discuss any questions you have with your health care provider. Document Released: 11/07/2012 Document Revised:  10/26/2016 Document Reviewed: 10/26/2016 Elsevier Interactive Patient Education  2019 [employer]. Sinusitis, Adult Sinusitis is inflammation of your sinuses. Sinuses are hollow spaces in the  bones around your face. Your sinuses are located:  Around your eyes.  In the middle of your forehead.  Behind your nose.  In your cheekbones. Mucus normally drains out of your sinuses. When your nasal tissues become inflamed or swollen, mucus can become trapped or blocked. This allows bacteria, viruses, and fungi to grow, which leads to infection. Most infections of the sinuses are caused by a virus. Sinusitis can develop quickly. It can last for up to 4 weeks (acute) or for more than 12 weeks (chronic). Sinusitis often develops after a cold. What are the causes? This condition is caused by anything that creates swelling in the sinuses or stops mucus from draining. This includes:  Allergies.  Asthma.  Infection from bacteria or viruses.  Deformities or blockages in your nose or sinuses.  Abnormal growths in the nose (nasal polyps).  Pollutants, such as chemicals or irritants in the air.  Infection from fungi (rare). What increases the risk? You are more likely to develop this condition if you:  Have a weak body defense system (immune system).  Do a lot of swimming or diving.  Overuse nasal sprays.  Smoke. What are the signs or symptoms? The main symptoms of this condition are pain and a feeling of pressure around the affected sinuses. Other symptoms include:  Stuffy nose or congestion.  Thick drainage from your nose.  Swelling and warmth over the affected sinuses.  Headache.  Upper toothache.  A cough that may get worse at night.  Extra mucus that collects in the throat or the back of the nose (postnasal drip).  Decreased sense of smell and taste.  Fatigue.  A fever.  Sore throat.  Bad breath. How is this diagnosed? This condition is diagnosed based on:   Your symptoms.  Your medical history.  A physical exam.  Tests to find out if your condition is acute or chronic. This may include: ? Checking your nose for nasal polyps. ? Viewing your sinuses using a device that has a light (endoscope). ? Testing for allergies or bacteria. ? Imaging tests, such as an MRI or CT scan. In rare cases, a bone biopsy may be done to rule out more serious types of fungal sinus disease. How is this treated? Treatment for sinusitis depends on the cause and whether your condition is chronic or acute.  If caused by a virus, your symptoms should go away on their own within 10 days. You may be given medicines to relieve symptoms. They include: ? Medicines that shrink swollen nasal passages (topical intranasal decongestants). ? Medicines that treat allergies (antihistamines). ? A spray that eases inflammation of the nostrils (topical intranasal corticosteroids). ? Rinses that help get rid of thick mucus in your nose (nasal saline washes).  If caused by bacteria, your health care provider may recommend waiting to see if your symptoms improve. Most bacterial infections will get better without antibiotic medicine. You may be given antibiotics if you have: ? A severe infection. ? A weak immune system.  If caused by narrow nasal passages or nasal polyps, you may need to have surgery. Follow these instructions at home: Medicines  Take, use, or apply over-the-counter and prescription medicines only as told by your health care provider. These may include nasal sprays.  If you were prescribed an antibiotic medicine, take it as told by your health care provider. Do not stop taking the antibiotic even if you start to feel better. Hydrate and humidify   Drink enough fluid to keep your  urine pale yellow. Staying hydrated will help to thin your mucus.  Use a cool mist humidifier to keep the humidity level in your home above 50%.  Inhale steam for 10-15 minutes, 3-4 times  a day, or as told by your health care provider. You can do this in the bathroom while a hot shower is running.  Limit your exposure to cool or dry air. Rest  Rest as much as possible.  Sleep with your head raised (elevated).  Make sure you get enough sleep each night. General instructions   Apply a warm, moist washcloth to your face 3-4 times a day or as told by your health care provider. This will help with discomfort.  Wash your hands often with soap and water to reduce your exposure to germs. If soap and water are not available, use hand sanitizer.  Do not smoke. Avoid being around people who are smoking (secondhand smoke).  Keep all follow-up visits as told by your health care provider. This is important. Contact a health care provider if:  You have a fever.  Your symptoms get worse.  Your symptoms do not improve within 10 days. Get help right away if:  You have a severe headache.  You have persistent vomiting.  You have severe pain or swelling around your face or eyes.  You have vision problems.  You develop confusion.  Your neck is stiff.  You have trouble breathing. Summary  Sinusitis is soreness and inflammation of your sinuses. Sinuses are hollow spaces in the bones around your face.  This condition is caused by nasal tissues that become inflamed or swollen. The swelling traps or blocks the flow of mucus. This allows bacteria, viruses, and fungi to grow, which leads to infection.  If you were prescribed an antibiotic medicine, take it as told by your health care provider. Do not stop taking the antibiotic even if you start to feel better.  Keep all follow-up visits as told by your health care provider. This is important. This information is not intended to replace advice given to you by your health care provider. Make sure you discuss any questions you have with your health care provider. Document Released: 07/13/2005 Document Revised: 12/13/2017 Document  Reviewed: 12/13/2017 Elsevier Interactive Patient Education  2019 [employer] Amoxicillin; Clavulanic Acid tablets What is this medicine? AMOXICILLIN; CLAVULANIC ACID (a mox i SIL in; KLAV yoo lan ic AS id) is a penicillin antibiotic. It is used to treat certain kinds of bacterial infections. It will not work for colds, flu, or other viral infections. This medicine may be used for other purposes; ask your health care provider or pharmacist if you have questions. COMMON BRAND NAME(S): Augmentin What should I tell my health care provider before I take this medicine? They need to know if you have any of these conditions: -bowel disease, like colitis -kidney disease -liver disease -mononucleosis -an unusual or allergic reaction to amoxicillin, penicillin, cephalosporin, other antibiotics, clavulanic acid, other medicines, foods, dyes, or preservatives -pregnant or trying to get pregnant -breast-feeding How should I use this medicine? Take this medicine by mouth with a full glass of water. Follow the directions on the prescription label. Take at the start of a meal. Do not crush or chew. If the tablet has a score line, you may cut it in half at the score line for easier swallowing. Take your medicine at regular intervals. Do not take your medicine more often than directed. Take all of your medicine as directed even if  you think you are better. Do not skip doses or stop your medicine early. Talk to your pediatrician regarding the use of this medicine in children. Special care may be needed. Overdosage: If you think you have taken too much of this medicine contact a poison control center or emergency room at once. NOTE: This medicine is only for you. Do not share this medicine with others. What if I miss a dose? If you miss a dose, take it as soon as you can. If it is almost time for your next dose, take only that dose. Do not take double or extra doses. What may interact with this medicine?  -allopurinol -anticoagulants -birth control pills -methotrexate -probenecid This list may not describe all possible interactions. Give your health care provider a list of all the medicines, herbs, non-prescription drugs, or dietary supplements you use. Also tell them if you smoke, drink alcohol, or use illegal drugs. Some items may interact with your medicine. What should I watch for while using this medicine? Tell your doctor or health care professional if your symptoms do not improve. Do not treat diarrhea with over the counter products. Contact your doctor if you have diarrhea that lasts more than 2 days or if it is severe and watery. If you have diabetes, you may get a false-positive result for sugar in your urine. Check with your doctor or health care professional. Birth control pills may not work properly while you are taking this medicine. Talk to your doctor about using an extra method of birth control. What side effects may I notice from receiving this medicine? Side effects that you should report to your doctor or health care professional as soon as possible: -allergic reactions like skin rash, itching or hives, swelling of the face, lips, or tongue -breathing problems -dark urine -fever or chills, sore throat -redness, blistering, peeling or loosening of the skin, including inside the mouth -seizures -trouble passing urine or change in the amount of urine -unusual bleeding, bruising -unusually weak or tired -white patches or sores in the mouth or throat Side effects that usually do not require medical attention (report to your doctor or health care professional if they continue or are bothersome): -diarrhea -dizziness -headache -nausea, vomiting -stomach upset -vaginal or anal irritation This list may not describe all possible side effects. Call your doctor for medical advice about side effects. You may report side effects to FDA at 1-800-FDA-1088. Where should I keep my  medicine? Keep out of the reach of children. Store at room temperature below 25 degrees C (77 degrees F). Keep container tightly closed. Throw away any unused medicine after the expiration date. NOTE: This sheet is a summary. It may not cover all possible information. If you have questions about this medicine, talk to your doctor, pharmacist, or health care provider.  2019 Elsevier/Gold Standard (2007-10-06 12:04:30)

## 2018-12-20 NOTE — Progress Notes (Signed)
[facility]   Established Patient Office Visit  Subjective:  Patient ID: Kevin [name], male    DOB: 1956-04-24  Age: 63 y.o. MRN: [id number]  CC:  Chief Complaint  Patient presents with  . Follow-up    chronic condition     HPI Kevin [name] is a 63 year old male who presents for follow up today.   Past Medical History:  Diagnosis Date  . Asthma   . Chronic systolic CHF (congestive heart failure) (HCC)    a. 06/2015 Echo: EF 25-30%, mod-sev MR, PASP . b. 08/2016: echo showing EF of 20-25% with diffuse HK  . Cocaine use   . Essential hypertension   . Longstanding persistent atrial fibrillation    a. Initially dx 06/2015, paroxysmal at that time. b. Recurrent in Feb 2018 and beyond.  . Moderate mitral regurgitation    a. 06/2015 Echo: Mod-Sev MR. b. 08/2016: echo showing moderate MR.   Marland Kitchen NICM (nonischemic cardiomyopathy) (HCC)    a. 06/2015 Echo: EF 25-30%, normal cors by cath - ? Tachy-mediated;    . Non-obstructive CAD    a. 06/2015 Cath: LM nl, LAD 58m, LCX nl, RCA nl, EF 25-30%.  . Tobacco use    Current Status: Since his last office visit, he is doing well with no complaints. He denies visual changes, chest pain, cough, shortness of breath, heart palpitations, and falls. He has occasional headaches and dizziness with position changes. Denies severe headaches, confusion, seizures, double vision, and blurred vision, nausea and vomiting.  He denies fevers, chills, fatigue, recent infections, weight loss, and night sweats. No reports of GI problems such as diarrhea, and constipation. He has no reports of blood in stools, dysuria and hematuria. No depression or anxiety reported. He denies pain today.   Past Surgical History:  Procedure Laterality Date  . CARDIAC CATHETERIZATION N/A 07/26/2015   Procedure: Left Heart Cath and Coronary Angiography;  Surgeon: Corky Crafts, MD;  Location: [facility] INVASIVE CV LAB;  Service:  Cardiovascular;  Laterality: N/A;  . CARDIOVERSION N/A 04/09/2017   Procedure: CARDIOVERSION;  Surgeon: Pricilla Riffle, MD;  Location: [facility] ENDOSCOPY;  Service: Cardiovascular;  Laterality: N/A;  . TEE WITHOUT CARDIOVERSION N/A 04/09/2017   Procedure: TRANSESOPHAGEAL ECHOCARDIOGRAM (TEE);  Surgeon: Pricilla Riffle, MD;  Location: [facility] ENDOSCOPY;  Service: Cardiovascular;  Laterality: N/A;    Family History  Problem Relation Age of Onset  . Hypertension Father     Social History   Socioeconomic History  . Marital status: Single    Spouse name: Not on file  . Number of children: Not on file  . Years of education: Not on file  . Highest education level: Not on file  Occupational History    Employer: [employer]  Social Needs  . Financial resource strain: Not on file  . Food insecurity:    Worry: Not on file    Inability: Not on file  . Transportation needs:    Medical: Not on file    Non-medical: Not on file  Tobacco Use  . Smoking status: Former Smoker    Packs/day: 0.50    Years: 40.00    Pack years: 20.00    Types: Cigarettes  . Smokeless tobacco: Never Used  . Tobacco comment: Quit in 2014  Substance and Sexual Activity  . Alcohol use: Yes    Alcohol/week: 0.0 standard drinks    Comment: 2-3 drinks per week.  . Drug use: Yes  Comment: Not currently. Quit in 2014. 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  . Sexual activity: Never  Lifestyle  . Physical activity:    Days per week: Not on file    Minutes per session: Not on file  . Stress: Not on file  Relationships  . Social connections:    Talks on phone: Not on file    Gets together: Not on file    Attends religious service: Not on file    Active member of club or organization: Not on file    Attends meetings of clubs or organizations: Not on file    Relationship status: Not on file  . Intimate partner violence:    Fear of current or ex partner: Not on file    Emotionally abused: Not on file    Physically abused: Not on  file    Forced sexual activity: Not on file  Other Topics Concern  . Not on file  Social History Narrative  . Not on file    Outpatient Medications Prior to Visit  Medication Sig Dispense Refill  . albuterol (PROVENTIL HFA;VENTOLIN HFA) 108 (90 Base) MCG/ACT inhaler Inhale 2 puffs every 6 (six) hours as needed into the lungs for wheezing or shortness of breath. 1 Inhaler 3  . albuterol (PROVENTIL) (2.5 MG/3ML) 0.083% nebulizer solution Take 3 mLs (2.5 mg total) by nebulization every 6 (six) hours as needed for wheezing or shortness of breath. 75 mL 12  . apixaban (ELIQUIS) 5 MG TABS tablet Take 1 tablet (5 mg total) by mouth 2 (two) times daily. 60 tablet 12  . furosemide (LASIX) 40 MG tablet Take 1 tablet (40 mg total) by mouth 2 (two) times daily. 180 tablet 3  . lansoprazole (PREVACID) 30 MG capsule Take 30 mg by mouth daily as needed (for heartburn/indigestion).    . metoprolol succinate (TOPROL-XL) 50 MG 24 hr tablet Take 2 tablets ( ) in the morning, 1 tablet ( ) in the evening with or after meal. 90 tablet 11  . sacubitril-valsartan (ENTRESTO) 49-51 MG Take 1 tablet by mouth 2 (two) times daily. 180 tablet 3  . spironolactone (ALDACTONE) 25 MG tablet Take 1/2 to 1 tablet daily as instructed. 30 tablet 11   No facility-administered medications prior to visit.     No Known Allergies  ROS Review of Systems  Constitutional: Negative.   HENT: Negative.   Eyes: Negative.   Respiratory: Positive for cough (r/t Asthma) and shortness of breath (Occasional r/t Athsma).   Cardiovascular: Negative.   Gastrointestinal: Positive for abdominal distention (obese).  Endocrine: Negative.   Musculoskeletal: Negative.   Skin: Negative.   Allergic/Immunologic: Negative.   Neurological: Positive for dizziness and headaches.  Hematological: Negative.   Psychiatric/Behavioral: Negative.    Objective:    Physical Exam  Constitutional: He is oriented to person, place, and time. He  appears well-developed and well-nourished.  HENT:  Head: Normocephalic and atraumatic.  Eyes: Conjunctivae are normal.  Neck: Normal range of motion. Neck supple.  Cardiovascular: Normal rate, regular rhythm, normal heart sounds and intact distal pulses.  Pulmonary/Chest: Effort normal and breath sounds normal.  Abdominal: Soft. Bowel sounds are normal.  Musculoskeletal: Normal range of motion.  Neurological: He is alert and oriented to person, place, and time. He has normal reflexes.  Skin: Skin is warm and dry.  Psychiatric: He has a normal mood and affect. His behavior is normal. Judgment and thought content normal.    BP 126/78 (BP Location: Left Arm, Patient Position: Sitting, Cuff Size:  Large)   Pulse 84   Temp 98 F (36.7 C) (Oral)   Ht  (1.854 m)   Wt 259 lb (117.5 kg)   SpO2 97%   BMI 34.17 kg/m  Wt Readings from Last 3 Encounters:  12/20/18 259 lb (117.5 kg)  09/26/18 256 lb 3.2 oz (116.2 kg)  01/18/18 238 lb 8 oz (108.2 kg)     Health Maintenance Due  Topic Date Due  . Hepatitis C Screening  Dec 29, 1955  . COLONOSCOPY  02/12/2004    There are no preventive care reminders to display for this patient.  Lab Results  Component Value Date   TSH 2.050 12/20/2018   Lab Results  Component Value Date   WBC 6.7 12/20/2018   HGB 13.5 12/20/2018   HCT 41.2 12/20/2018   MCV 80 12/20/2018   PLT 287 12/20/2018   Lab Results  Component Value Date   NA 141 12/20/2018   K 3.4 (L) 12/20/2018   CO2 25 12/20/2018   GLUCOSE 102 (H) 12/20/2018   BUN 8 12/20/2018   CREATININE 1.18 12/20/2018   BILITOT 0.5 12/20/2018   ALKPHOS 139 (H) 12/20/2018   AST 14 12/20/2018   ALT 11 12/20/2018   PROT 7.4 12/20/2018   ALBUMIN 4.1 12/20/2018   CALCIUM 9.5 12/20/2018   ANIONGAP 7 04/10/2017   Lab Results  Component Value Date   CHOL 169 12/20/2018   Lab Results  Component Value Date   HDL 29 (L) 12/20/2018   Lab Results  Component Value Date   LDLCALC 104 (H)  12/20/2018   Lab Results  Component Value Date   TRIG 182 (H) 12/20/2018   Lab Results  Component Value Date   CHOLHDL 5.8 (H) 12/20/2018   Lab Results  Component Value Date   HGBA1C 6.3 (A) 12/20/2018      Assessment & Plan:   1. Congestive heart failure, unspecified HF chronicity, unspecified heart failure type (HCC) - CBC with Differential - Comprehensive metabolic panel - TSH - Lipid Panel - Vitamin D, 25-hydroxy - Vitamin B12  2. Essential hypertension The current medical regimen is effective; blood pressure is stable at 126/78 today; continue present plan and medications as prescribed. He will continue to decrease high sodium intake, excessive alcohol intake, increase potassium intake, smoking cessation, and increase physical activity of at least 30 minutes of cardio activity daily. He will continue to follow Heart Healthy or DASH diet. - CBC with Differential - Comprehensive metabolic panel - TSH - Lipid Panel - Vitamin D, 25-hydroxy - Vitamin B12  3. Mild asthma without complication, unspecified whether persistent We will initiate antihistamine today.  - montelukast (SINGULAIR) 10 MG tablet; Take 1 tablet (10 mg total) by mouth at bedtime.  Dispense: 30 tablet; Refill: 3 - Cetirizine HCl (ZYRTEC ALLERGY) 10 MG CAPS; Take 1 capsule (10 mg total) by mouth daily.  Dispense: 30 capsule; Refill: 6  4. Sinus congestion We will initiate antibiotic today.  - amoxicillin-clavulanate (AUGMENTIN) 875-125 MG tablet; Take 1 tablet by mouth 2 (two) times daily for 7 days.  Dispense: 14 tablet; Refill: 0  5. Abdominal distension    6. Screening for diabetes mellitus Hgb A1c is stable at 6.3 today. He will continue to decrease foods/beverages high in sugars and carbs and follow Heart Healthy or DASH diet. Increase physical activity to at least 30 minutes cardio exercise daily.  - POCT glycosylated hemoglobin (Hb A1C) - POCT urinalysis dipstick  7. Seasonal allergies We  will initiate Montelukast as  prescribed.  - montelukast (SINGULAIR) 10 MG tablet; Take 1 tablet (10 mg total) by mouth at bedtime.  Dispense: 30 tablet; Refill: 3  8. Persistent atrial fibrillation Stable today. Continue Eliquis as prescribed.   9. Current use of long term anticoagulation  10. Follow up He will follow up in 3 months.   Meds ordered this encounter  Medications  . montelukast (SINGULAIR) 10 MG tablet    Sig: Take 1 tablet (10 mg total) by mouth at bedtime.    Dispense:  30 tablet    Refill:  3  . Cetirizine HCl (ZYRTEC ALLERGY) 10 MG CAPS    Sig: Take 1 capsule (10 mg total) by mouth daily.    Dispense:  30 capsule    Refill:  6  . amoxicillin-clavulanate (AUGMENTIN) 875-125 MG tablet    Sig: Take 1 tablet by mouth 2 (two) times daily for 7 days.    Dispense:  14 tablet    Refill:  0    Orders Placed This Encounter  Procedures  . CBC with Differential  . Comprehensive metabolic panel  . TSH  . Lipid Panel  . Vitamin D, 25-hydroxy  . Vitamin B12  . POCT glycosylated hemoglobin (Hb A1C)  . POCT urinalysis dipstick    Referral Orders  No referral(s) requested today    Raliegh Ip,  MSN, FNP-BC [facility] [address] [place], [place] [address] [phone]   Problem List Items Addressed This Visit      Cardiovascular and Mediastinum   CHF (congestive heart failure) (HCC) - Primary   Relevant Orders   CBC with Differential (Completed)   Comprehensive metabolic panel (Completed)   TSH (Completed)   Lipid Panel (Completed)   Vitamin D, 25-hydroxy (Completed)   Vitamin B12 (Completed)   Essential hypertension   Relevant Orders   CBC with Differential (Completed)   Comprehensive metabolic panel (Completed)   TSH (Completed)   Lipid Panel (Completed)   Vitamin D, 25-hydroxy (Completed)   Vitamin B12 (Completed)     Respiratory   Asthma   Relevant Medications   montelukast (SINGULAIR) 10 MG tablet    Cetirizine HCl (ZYRTEC ALLERGY) 10 MG CAPS    Other Visit Diagnoses    Sinus congestion       Relevant Medications   amoxicillin-clavulanate (AUGMENTIN) 875-125 MG tablet   Abdominal distension       Screening for diabetes mellitus       Relevant Orders   POCT glycosylated hemoglobin (Hb A1C) (Completed)   POCT urinalysis dipstick   Seasonal allergies       Relevant Medications   montelukast (SINGULAIR) 10 MG tablet   Follow up          Meds ordered this encounter  Medications  . montelukast (SINGULAIR) 10 MG tablet    Sig: Take 1 tablet (10 mg total) by mouth at bedtime.    Dispense:  30 tablet    Refill:  3  . Cetirizine HCl (ZYRTEC ALLERGY) 10 MG CAPS    Sig: Take 1 capsule (10 mg total) by mouth daily.    Dispense:  30 capsule    Refill:  6  . amoxicillin-clavulanate (AUGMENTIN) 875-125 MG tablet    Sig: Take 1 tablet by mouth 2 (two) times daily for 7 days.    Dispense:  14 tablet    Refill:  0    Follow-up: Return in about 3 months (around 03/22/2019).    Kallie Locks,  FNP 

## 2018-12-21 DIAGNOSIS — R0981 Nasal congestion: Secondary | ICD-10-CM | POA: Insufficient documentation

## 2018-12-21 LAB — LIPID PANEL
Chol/HDL Ratio: 5.8 ratio — ABNORMAL HIGH (ref 0.0–5.0)
Cholesterol, Total: 169 mg/dL (ref 100–199)
HDL: 29 mg/dL — ABNORMAL LOW (ref >39)
LDL Calculated: 104 mg/dL — ABNORMAL HIGH (ref 0–99)
Triglycerides: 182 mg/dL — ABNORMAL HIGH (ref 0–149)
VLDL Cholesterol Cal: 36 mg/dL (ref 5–40)

## 2018-12-21 LAB — COMPREHENSIVE METABOLIC PANEL
ALT: 11 IU/L (ref 0–44)
AST: 14 IU/L (ref 0–40)
Albumin/Globulin Ratio: 1.2 (ref 1.2–2.2)
Albumin: 4.1 g/dL (ref 3.8–4.8)
Alkaline Phosphatase: 139 IU/L — ABNORMAL HIGH (ref 39–117)
BUN/Creatinine Ratio: 7 — ABNORMAL LOW (ref 10–24)
BUN: 8 mg/dL (ref 8–27)
Bilirubin Total: 0.5 mg/dL (ref 0.0–1.2)
CO2: 25 mmol/L (ref 20–29)
Calcium: 9.5 mg/dL (ref 8.6–10.2)
Chloride: 99 mmol/L (ref 96–106)
Creatinine, Ser: 1.18 mg/dL (ref 0.76–1.27)
GFR calc Af Amer: 75 mL/min/{1.73_m2} (ref >59)
GFR calc non Af Amer: 65 mL/min/{1.73_m2} (ref >59)
Globulin, Total: 3.3 g/dL (ref 1.5–4.5)
Glucose: 102 mg/dL — ABNORMAL HIGH (ref 65–99)
Potassium: 3.4 mmol/L — ABNORMAL LOW (ref 3.5–5.2)
Sodium: 141 mmol/L (ref 134–144)
Total Protein: 7.4 g/dL (ref 6.0–8.5)

## 2018-12-21 LAB — VITAMIN B12: Vitamin B-12: 552 pg/mL (ref 232–1245)

## 2018-12-21 LAB — CBC WITH DIFFERENTIAL/PLATELET
Basophils Absolute: 0.1 10*3/uL (ref 0.0–0.2)
Basos: 1 %
EOS (ABSOLUTE): 0.3 10*3/uL (ref 0.0–0.4)
Eos: 5 %
Hematocrit: 41.2 % (ref 37.5–51.0)
Hemoglobin: 13.5 g/dL (ref 13.0–17.7)
Immature Grans (Abs): 0 10*3/uL (ref 0.0–0.1)
Immature Granulocytes: 0 %
Lymphocytes Absolute: 2 10*3/uL (ref 0.7–3.1)
Lymphs: 30 %
MCH: 26.2 pg — ABNORMAL LOW (ref 26.6–33.0)
MCHC: 32.8 g/dL (ref 31.5–35.7)
MCV: 80 fL (ref 79–97)
Monocytes Absolute: 0.7 10*3/uL (ref 0.1–0.9)
Monocytes: 10 %
Neutrophils Absolute: 3.6 10*3/uL (ref 1.4–7.0)
Neutrophils: 54 %
Platelets: 287 10*3/uL (ref 150–450)
RBC: 5.15 x10E6/uL (ref 4.14–5.80)
RDW: 14.5 % (ref 11.6–15.4)
WBC: 6.7 10*3/uL (ref 3.4–10.8)

## 2018-12-21 LAB — TSH: TSH: 2.05 u[IU]/mL (ref 0.450–4.500)

## 2018-12-21 LAB — VITAMIN D 25 HYDROXY (VIT D DEFICIENCY, FRACTURES): Vit D, 25-Hydroxy: 12.8 ng/mL — ABNORMAL LOW (ref 30.0–100.0)

## 2018-12-30 ENCOUNTER — Other Ambulatory Visit: Payer: Self-pay | Admitting: Family Medicine

## 2018-12-30 ENCOUNTER — Encounter: Payer: Self-pay | Admitting: Family Medicine

## 2018-12-30 DIAGNOSIS — E559 Vitamin D deficiency, unspecified: Secondary | ICD-10-CM

## 2018-12-30 MED ORDER — VITAMIN D (ERGOCALCIFEROL) 1.25 MG (50000 UNIT) PO CAPS: 50000.0000 [IU] | ORAL_CAPSULE | ORAL | 3 refills | Status: DC

## 2019-01-02 ENCOUNTER — Telehealth: Payer: Self-pay

## 2019-01-02 NOTE — Telephone Encounter (Signed)
-----   Message from Azzie Glatter, FNP sent at 01/02/2019 11:33 AM EDT ----- Cholesterol levels are mildly elevated. Continue low-fat, low cholesterol diet, increase fluids, increase fruits and vegetables. All other labs are stable.

## 2019-01-02 NOTE — Telephone Encounter (Signed)
Called, no answer. Will try later.  

## 2019-01-02 NOTE — Telephone Encounter (Signed)
Called and spoke with patient, advised that cholesterol was mildly elevated and that he should eat a low fat. Low cholesterol diet. Asked that he increase fluids, veggies, and fruits. Asked that he keep next scheduled appointment. Thanks!

## 2019-02-15 ENCOUNTER — Other Ambulatory Visit: Payer: Self-pay | Admitting: Cardiology

## 2019-03-10 ENCOUNTER — Other Ambulatory Visit: Payer: Self-pay | Admitting: Family Medicine

## 2019-03-10 DIAGNOSIS — J45909 Unspecified asthma, uncomplicated: Secondary | ICD-10-CM

## 2019-03-10 DIAGNOSIS — J302 Other seasonal allergic rhinitis: Secondary | ICD-10-CM

## 2019-03-22 ENCOUNTER — Ambulatory Visit: Payer: Self-pay | Admitting: Family Medicine

## 2019-03-31 ENCOUNTER — Ambulatory Visit (INDEPENDENT_AMBULATORY_CARE_PROVIDER_SITE_OTHER): Payer: Medicare Other | Admitting: Family Medicine

## 2019-03-31 ENCOUNTER — Other Ambulatory Visit: Payer: Self-pay

## 2019-03-31 ENCOUNTER — Encounter: Payer: Self-pay | Admitting: Family Medicine

## 2019-03-31 VITALS — BP 113/61 | HR 106 | Temp 98.2°F | Ht 73.0 in | Wt 264.0 lb

## 2019-03-31 DIAGNOSIS — I509 Heart failure, unspecified: Secondary | ICD-10-CM

## 2019-03-31 DIAGNOSIS — J45909 Unspecified asthma, uncomplicated: Secondary | ICD-10-CM | POA: Diagnosis not present

## 2019-03-31 DIAGNOSIS — Z09 Encounter for follow-up examination after completed treatment for conditions other than malignant neoplasm: Secondary | ICD-10-CM

## 2019-03-31 DIAGNOSIS — I10 Essential (primary) hypertension: Secondary | ICD-10-CM | POA: Diagnosis not present

## 2019-03-31 DIAGNOSIS — M62838 Other muscle spasm: Secondary | ICD-10-CM | POA: Diagnosis not present

## 2019-03-31 MED ORDER — CYCLOBENZAPRINE HCL 10 MG PO TABS: 10.0000 mg | ORAL_TABLET | Freq: Three times a day (TID) | ORAL | 3 refills | Status: DC | PRN

## 2019-03-31 NOTE — Patient Instructions (Signed)
Muscle Cramps and Spasms Muscle cramps and spasms are when muscles tighten by themselves. They usually get better within minutes. Muscle cramps are painful. They are usually stronger and last longer than muscle spasms. Muscle spasms may or may not be painful. They can last a few seconds or much longer. Cramps and spasms can affect any muscle, but they occur most often in the calf muscles of the leg. They are usually not caused by a serious problem. In many cases, the cause is not known. Some common causes include:  Doing more physical work or exercise than your body is ready for.  Using the muscles too much (overuse) by repeating certain movements too many times.  Staying in a certain position for a long time.  Playing a sport or doing an activity without preparing properly.  Using bad form or technique while playing a sport or doing an activity.  Not having enough water in your body (dehydration).  Injury.  Side effects of some medicines.  Low levels of the salts and minerals in your blood (electrolytes), such as low potassium or calcium. Follow these instructions at home: Managing pain and stiffness      Massage, stretch, and relax the muscle. Do this for many minutes at a time.  If told, put heat on tight or tense muscles as often as told by your doctor. Use the heat source that your doctor recommends, such as a moist heat pack or a heating pad. ? Place a towel between your skin and the heat source. ? Leave the heat on for 20-30 minutes. ? Remove the heat if your skin turns bright red. This is very important if you are not able to feel pain, heat, or cold. You may have a greater risk of getting burned.  If told, put ice on the affected area. This may help if you are sore or have pain after a cramp or spasm. ? Put ice in a plastic bag. ? Place a towel between your skin and the bag. ? Leave the ice on for 20 minutes, 2-3 times a day.  Try taking hot showers or baths to help  relax tight muscles. Eating and drinking  Drink enough fluid to keep your pee (urine) pale yellow.  Eat a healthy diet to help ensure that your muscles work well. This should include: ? Fruits and vegetables. ? Lean protein. ? Whole grains. ? Low-fat or nonfat dairy products. General instructions  If you are having cramps often, avoid intense exercise for several days.  Take over-the-counter and prescription medicines only as told by your doctor.  Watch for any changes in your symptoms.  Keep all follow-up visits as told by your doctor. This is important. Contact a doctor if:  Your cramps or spasms get worse or happen more often.  Your cramps or spasms do not get better with time. Summary  Muscle cramps and spasms are when muscles tighten by themselves. They usually get better within minutes.  Cramps and spasms occur most often in the calf muscles of the leg.  Massage, stretch, and relax the muscle. This may help the cramp or spasm go away.  Drink enough fluid to keep your pee (urine) pale yellow. This information is not intended to replace advice given to you by your health care provider. Make sure you discuss any questions you have with your health care provider. Document Released: 06/25/2008 Document Revised: 12/06/2017 Document Reviewed: 12/06/2017 Elsevier Patient Education  2020 Elsevier Inc.    Cyclobenzaprine tablets What   is this medicine? CYCLOBENZAPRINE (sye kloe BEN za preen) is a muscle relaxer. It is used to treat muscle pain, spasms, and stiffness. This medicine may be used for other purposes; ask your health care provider or pharmacist if you have questions. COMMON BRAND NAME(S): Fexmid, Flexeril What should I tell my health care provider before I take this medicine? They need to know if you have any of these conditions:  heart disease, irregular heartbeat, or previous heart attack  liver disease  thyroid problem  an unusual or allergic reaction to  cyclobenzaprine, tricyclic antidepressants, lactose, other medicines, foods, dyes, or preservatives  pregnant or trying to get pregnant  breast-feeding How should I use this medicine? Take this medicine by mouth with a glass of water. Follow the directions on the prescription label. If this medicine upsets your stomach, take it with food or milk. Take your medicine at regular intervals. Do not take it more often than directed. Talk to your pediatrician regarding the use of this medicine in children. Special care may be needed. Overdosage: If you think you have taken too much of this medicine contact a poison control center or emergency room at once. NOTE: This medicine is only for you. Do not share this medicine with others. What if I miss a dose? If you miss a dose, take it as soon as you can. If it is almost time for your next dose, take only that dose. Do not take double or extra doses. What may interact with this medicine? Do not take this medicine with any of the following medications:  MAOIs like Carbex, Eldepryl, Marplan, Nardil, and Parnate  narcotic medicines for cough  safinamide This medicine may also interact with the following medications:  alcohol  bupropion  antihistamines for allergy, cough and cold  certain medicines for anxiety or sleep  certain medicines for bladder problems like oxybutynin, tolterodine  certain medicines for depression like amitriptyline, fluoxetine, sertraline  certain medicines for Parkinson's disease like benztropine, trihexyphenidyl  certain medicines for seizures like phenobarbital, primidone  certain medicines for stomach problems like dicyclomine, hyoscyamine  certain medicines for travel sickness like scopolamine  general anesthetics like halothane, isoflurane, methoxyflurane, propofol  ipratropium  local anesthetics like lidocaine, pramoxine, tetracaine  medicines that relax muscles for surgery  narcotic medicines for  pain  phenothiazines like chlorpromazine, mesoridazine, prochlorperazine, thioridazine  verapamil This list may not describe all possible interactions. Give your health care provider a list of all the medicines, herbs, non-prescription drugs, or dietary supplements you use. Also tell them if you smoke, drink alcohol, or use illegal drugs. Some items may interact with your medicine. What should I watch for while using this medicine? Tell your doctor or health care professional if your symptoms do not start to get better or if they get worse. You may get drowsy or dizzy. Do not drive, use machinery, or do anything that needs mental alertness until you know how this medicine affects you. Do not stand or sit up quickly, especially if you are an older patient. This reduces the risk of dizzy or fainting spells. Alcohol may interfere with the effect of this medicine. Avoid alcoholic drinks. If you are taking another medicine that also causes drowsiness, you may have more side effects. Give your health care provider a list of all medicines you use. Your doctor will tell you how much medicine to take. Do not take more medicine than directed. Call emergency for help if you have problems breathing or unusual sleepiness. Your mouth   may get dry. Chewing sugarless gum or sucking hard candy, and drinking plenty of water may help. Contact your doctor if the problem does not go away or is severe. What side effects may I notice from receiving this medicine? Side effects that you should report to your doctor or health care professional as soon as possible:  allergic reactions like skin rash, itching or hives, swelling of the face, lips, or tongue  breathing problems  chest pain  fast, irregular heartbeat  hallucinations  seizures  unusually weak or tired Side effects that usually do not require medical attention (report to your doctor or health care professional if they continue or are bothersome):   headache  nausea, vomiting This list may not describe all possible side effects. Call your doctor for medical advice about side effects. You may report side effects to FDA at 1-800-FDA-1088. Where should I keep my medicine? Keep out of the reach of children. Store at room temperature between 15 and 30 degrees C (59 and 86 degrees F). Keep container tightly closed. Throw away any unused medicine after the expiration date. NOTE: This sheet is a summary. It may not cover all possible information. If you have questions about this medicine, talk to your doctor, pharmacist, or health care provider.  2020 Elsevier/Gold Standard (2018-06-15 12:49:26)  

## 2019-03-31 NOTE — Progress Notes (Signed)
[facility]   Established Patient Office Visit  Subjective:  Patient ID: Kevin Doomaul Rembold, male    DOB: 08-Jan-1956  Age: 63 y.o. MRN: [id number]  CC:  Chief Complaint  Patient presents with  . Follow-up    lower back pain tightness in back going on for few months ,taking alieve for the pain helped at tmes     HPI Kevin [name] is a 63 year old male who presents for Follow Up today.  Past Medical History:  Diagnosis Date  . Asthma   . Chronic systolic CHF (congestive heart failure) (HCC)    a. 06/2015 Echo: EF 25-30%, mod-sev MR, PASP 37mmHg. b. 08/2016: echo showing EF of 20-25% with diffuse HK  . Cocaine use   . Essential hypertension   . Longstanding persistent atrial fibrillation    a. Initially dx 06/2015, paroxysmal at that time. b. Recurrent in Feb 2018 and beyond.  . Moderate mitral regurgitation    a. 06/2015 Echo: Mod-Sev MR. b. 08/2016: echo showing moderate MR.   Marland Kitchen. NICM (nonischemic cardiomyopathy) (HCC)    a. 06/2015 Echo: EF 25-30%, normal cors by cath - ? Tachy-mediated;    . Non-obstructive CAD    a. 06/2015 Cath: LM nl, LAD 7466m, LCX nl, RCA nl, EF 25-30%.  . Tobacco use   . Vitamin D deficiency    Current Status: Since he last office visit, he has c/o chronic back pain. He is currently taking Aleve with minimal relief. He denies fevers, chills, fatigue, recent infections, weight loss, and night sweats. He has not had any headaches, visual changes, dizziness, and falls. No chest pain, heart palpitations, cough and shortness of breath reported. No reports of GI problems such as nausea, vomiting, diarrhea, and constipation. He has no reports of blood in stools, dysuria and hematuria. No depression or anxiety reported. He denies pain today.   Past Surgical History:  Procedure Laterality Date  . CARDIAC CATHETERIZATION N/A 07/26/2015   Procedure: Left Heart Cath and Coronary Angiography;  Surgeon: Corky CraftsJayadeep S Varanasi, MD;   [facility];  Service: Cardiovascular;  Laterality: N/A;  . CARDIOVERSION N/A 04/09/2017   Procedure: CARDIOVERSION;  Surgeon: Pricilla Riffleoss, Paula V, MD;  [facility];  Service: Cardiovascular;  Laterality: N/A;  . TEE WITHOUT CARDIOVERSION N/A 04/09/2017   Procedure: TRANSESOPHAGEAL ECHOCARDIOGRAM (TEE);  Surgeon: Pricilla Riffleoss, Paula V, MD;  [facility];  Service: Cardiovascular;  Laterality: N/A;    Family History  Problem Relation Age of Onset  . Hypertension Father     Social History   Socioeconomic History  . Marital status: Single    Spouse name: Not on file  . Number of children: Not on file  . Years of education: Not on file  . Highest education level: Not on file  Occupational History    Employer: [employer]  Social Needs  . Financial resource strain: Not on file  . Food insecurity    Worry: Not on file    Inability: Not on file  . Transportation needs    Medical: Not on file    Non-medical: Not on file  Tobacco Use  . Smoking status: Former Smoker    Packs/day: 0.50    Years: 40.00    Pack years: 20.00    Types: Cigarettes  . Smokeless tobacco: Never Used  . Tobacco comment: Quit in 2014  Substance and Sexual Activity  . Alcohol use: Yes    Alcohol/week: 0.0 standard drinks  Comment: 2-3 drinks per week.  . Drug use: Yes    Comment: Not currently. Quit in 2014. 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  . Sexual activity: Never  Lifestyle  . Physical activity    Days per week: Not on file    Minutes per session: Not on file  . Stress: Not on file  Relationships  . Social Musicianconnections    Talks on phone: Not on file    Gets together: Not on file    Attends religious service: Not on file    Active member of club or organization: Not on file    Attends meetings of clubs or organizations: Not on file    Relationship status: Not on file  . Intimate partner violence    Fear of current or ex partner: Not on file    Emotionally abused: Not on  file    Physically abused: Not on file    Forced sexual activity: Not on file  Other Topics Concern  . Not on file  Social History Narrative  . Not on file    Outpatient Medications Prior to Visit  Medication Sig Dispense Refill  . albuterol (PROVENTIL HFA;VENTOLIN HFA) 108 (90 Base) MCG/ACT inhaler Inhale 2 puffs every 6 (six) hours as needed into the lungs for wheezing or shortness of breath. 1 Inhaler 3  . albuterol (PROVENTIL) (2.5 MG/3ML) 0.083% nebulizer solution Take 3 mLs (2.5 mg total) by nebulization every 6 (six) hours as needed for wheezing or shortness of breath. 75 mL 12  . apixaban (ELIQUIS) 5 MG TABS tablet Take 1 tablet (5 mg total) by mouth 2 (two) times daily. 60 tablet 12  . Cetirizine HCl (ZYRTEC ALLERGY) 10 MG CAPS Take 1 capsule (10 mg total) by mouth daily. 30 capsule 6  . furosemide (LASIX) 40 MG tablet Take 1 tablet (40 mg total) by mouth 2 (two) times daily. 180 tablet 3  . lansoprazole (PREVACID) 30 MG capsule Take 30 mg by mouth daily as needed (for heartburn/indigestion).    . metoprolol succinate (TOPROL-XL) 50 MG 24 hr tablet Take 2 tablets (100mg ) in the morning, 1 tablet (50mg ) in the evening with or after meal. 90 tablet 11  . montelukast (SINGULAIR) 10 MG tablet TAKE 1 TABLET BY MOUTH EVERYDAY AT BEDTIME 90 tablet 1  . sacubitril-valsartan (ENTRESTO) 49-51 MG Take 1 tablet by mouth 2 (two) times daily. 180 tablet 3  . spironolactone (ALDACTONE) 25 MG tablet TAKE 1/2 TO 1 (ONE-HALF TO ONE) TABLET BY MOUTH ONCE DAILY AS  DIRECTED. Please make appt for future refills. [id number]. 1st attempt. 30 tablet 0  . Vitamin D, Ergocalciferol, (DRISDOL) 1.25 MG (50000 UT) CAPS capsule Take 1 capsule (50,000 Units total) by mouth every 7 (seven) days. (Patient not taking: Reported on 03/31/2019) 5 capsule 3   No facility-administered medications prior to visit.     No Known Allergies  ROS Review of Systems  Constitutional: Negative.   HENT: Negative.   Eyes:  Negative.   Respiratory: Negative.   Cardiovascular: Negative.   Gastrointestinal: Negative.   Endocrine: Negative.   Genitourinary: Negative.   Musculoskeletal: Positive for arthralgias (Generalized).  Skin: Negative.   Allergic/Immunologic: Negative.   Neurological: Positive for headaches (occasional ).  Hematological: Negative.   Psychiatric/Behavioral: Negative.       Objective:    Physical Exam  Constitutional: He is oriented to person, place, and time. He appears well-developed and well-nourished.  HENT:  Head: Normocephalic and atraumatic.  Eyes: Conjunctivae are normal.  Neck: Normal range of motion. Neck supple.  Cardiovascular: Normal rate, regular rhythm, normal heart sounds and intact distal pulses.  Pulmonary/Chest: Effort normal and breath sounds normal.  Abdominal: Soft. Bowel sounds are normal.  Musculoskeletal: Normal range of motion.  Neurological: He is alert and oriented to person, place, and time. He has normal reflexes.  Skin: Skin is warm and dry.  Psychiatric: He has a normal mood and affect. His behavior is normal. Judgment and thought content normal.  Nursing note and vitals reviewed.   BP 113/61 (BP Location: Left Arm, Patient Position: Sitting, Cuff Size: Normal)   Pulse (!) 106   Temp 98.2 F (36.8 C)   Ht 6\' 1"  (1.854 m)   Wt 264 lb (119.7 kg)   BMI 34.83 kg/m  Wt Readings from Last 3 Encounters:  03/31/19 264 lb (119.7 kg)  12/20/18 259 lb (117.5 kg)  09/26/18 256 lb 3.2 oz (116.2 kg)     Health Maintenance Due  Topic Date Due  . Hepatitis C Screening  1956-05-02  . COLONOSCOPY  02/12/2004  . PNA vac Low Risk Adult (1 of 2 - PCV13) 02/12/2019  . INFLUENZA VACCINE  02/25/2019    There are no preventive care reminders to display for this patient.  Lab Results  Component Value Date   TSH 2.050 12/20/2018   Lab Results  Component Value Date   WBC 6.7 12/20/2018   HGB 13.5 12/20/2018   HCT 41.2 12/20/2018   MCV 80 12/20/2018    PLT 287 12/20/2018   Lab Results  Component Value Date   NA 141 12/20/2018   K 3.4 (L) 12/20/2018   CO2 25 12/20/2018   GLUCOSE 102 (H) 12/20/2018   BUN 8 12/20/2018   CREATININE 1.18 12/20/2018   BILITOT 0.5 12/20/2018   ALKPHOS 139 (H) 12/20/2018   AST 14 12/20/2018   ALT 11 12/20/2018   PROT 7.4 12/20/2018   ALBUMIN 4.1 12/20/2018   CALCIUM 9.5 12/20/2018   ANIONGAP 7 04/10/2017   Lab Results  Component Value Date   CHOL 169 12/20/2018   Lab Results  Component Value Date   HDL 29 (L) 12/20/2018   Lab Results  Component Value Date   LDLCALC 104 (H) 12/20/2018   Lab Results  Component Value Date   TRIG 182 (H) 12/20/2018   Lab Results  Component Value Date   CHOLHDL 5.8 (H) 12/20/2018   Lab Results  Component Value Date   HGBA1C 6.3 (A) 12/20/2018      Assessment & Plan:   1. Muscle spasm We will initiate muscle relaxer today.  - cyclobenzaprine (FLEXERIL) 10 MG tablet; Take 1 tablet (10 mg total) by mouth 3 (three) times daily as needed for muscle spasms.  Dispense: 30 tablet; Refill: 3  2. Essential hypertension The current medical regimen is effective; blood pressure is stable at 113/61 today; continue present plan and medications as prescribed. He will continue to decrease high sodium intake, excessive alcohol intake, increase potassium intake, smoking cessation, and increase physical activity of at least 30 minutes of cardio activity daily. He will continue to follow Heart Healthy or DASH diet.  3. Congestive heart failure, unspecified HF chronicity, unspecified heart failure type ([place])  4. Mild asthma without complication, unspecified whether persistent Stable today. Continue Albuterol as prescribed.   5. Follow up Keep follow up appointment.   Meds ordered this encounter  Medications  . cyclobenzaprine (FLEXERIL) 10 MG tablet    Sig: Take 1 tablet (10 mg total)  by mouth 3 (three) times daily as needed for muscle spasms.    Dispense:  30  tablet    Refill:  3    No orders of the defined types were placed in this encounter.   Referral Orders  No referral(s) requested today    Raliegh Ip,  MSN, FNP-BC [facility] [address] [place], [place] [address] [phone]   Problem List Items Addressed This Visit      Cardiovascular and Mediastinum   CHF (congestive heart failure) (HCC)   Essential hypertension     Respiratory   Asthma    Other Visit Diagnoses    Muscle spasm    -  Primary   Relevant Medications   cyclobenzaprine (FLEXERIL) 10 MG tablet   Follow up          Meds ordered this encounter  Medications  . cyclobenzaprine (FLEXERIL) 10 MG tablet    Sig: Take 1 tablet (10 mg total) by mouth 3 (three) times daily as needed for muscle spasms.    Dispense:  30 tablet    Refill:  3    Follow-up: No follow-ups on file.    Kallie Locks, FNP

## 2019-04-04 ENCOUNTER — Telehealth: Payer: Self-pay

## 2019-04-04 NOTE — Telephone Encounter (Signed)
Called and l/m for pt call back regarding a form that pt is requesting to be filled out.

## 2019-06-07 ENCOUNTER — Other Ambulatory Visit: Payer: Self-pay

## 2019-06-07 ENCOUNTER — Encounter: Payer: Self-pay | Admitting: Family Medicine

## 2019-06-07 ENCOUNTER — Ambulatory Visit (INDEPENDENT_AMBULATORY_CARE_PROVIDER_SITE_OTHER): Payer: Medicare Other | Admitting: Family Medicine

## 2019-06-07 VITALS — BP 140/82 | HR 98 | Temp 98.6°F | Resp 18 | Ht 73.0 in | Wt 273.0 lb

## 2019-06-07 DIAGNOSIS — Z0000 Encounter for general adult medical examination without abnormal findings: Secondary | ICD-10-CM

## 2019-06-07 DIAGNOSIS — I509 Heart failure, unspecified: Secondary | ICD-10-CM

## 2019-06-07 DIAGNOSIS — Z09 Encounter for follow-up examination after completed treatment for conditions other than malignant neoplasm: Secondary | ICD-10-CM

## 2019-06-07 DIAGNOSIS — I4819 Other persistent atrial fibrillation: Secondary | ICD-10-CM

## 2019-06-07 DIAGNOSIS — Z6836 Body mass index (BMI) 36.0-36.9, adult: Secondary | ICD-10-CM

## 2019-06-07 DIAGNOSIS — J45909 Unspecified asthma, uncomplicated: Secondary | ICD-10-CM

## 2019-06-07 DIAGNOSIS — E66812 Obesity, class 2: Secondary | ICD-10-CM

## 2019-06-07 DIAGNOSIS — E65 Localized adiposity: Secondary | ICD-10-CM

## 2019-06-07 LAB — POCT URINALYSIS DIPSTICK
Bilirubin, UA: NEGATIVE
Glucose, UA: NEGATIVE
Ketones, UA: NEGATIVE
Leukocytes, UA: NEGATIVE
Nitrite, UA: NEGATIVE
Protein, UA: NEGATIVE
Spec Grav, UA: 1.015 (ref 1.010–1.025)
Urobilinogen, UA: 0.2 E.U./dL
pH, UA: 5 (ref 5.0–8.0)

## 2019-06-07 NOTE — Progress Notes (Signed)
[facility]   Annual Physical   Subjective:  Patient ID: Kevin [name], male    DOB: May 20, 1956  Age: 63 y.o. MRN: [id number]  CC:  Chief Complaint  Patient presents with  . Medicare Wellness    HPI Kevin [name] is a 62 year old male presents for his Annual Physical today.   Past Medical History:  Diagnosis Date  . Asthma   . Chronic systolic CHF (congestive heart failure) (HCC)    a. 06/2015 Echo: EF 25-30%, mod-sev MR, PASP . b. 08/2016: echo showing EF of 20-25% with diffuse HK  . Cocaine use   . Essential hypertension   . Longstanding persistent atrial fibrillation (HCC)    a. Initially dx 06/2015, paroxysmal at that time. b. Recurrent in Feb 2018 and beyond.  . Moderate mitral regurgitation    a. 06/2015 Echo: Mod-Sev MR. b. 08/2016: echo showing moderate MR.   Marland Kitchen NICM (nonischemic cardiomyopathy) (HCC)    a. 06/2015 Echo: EF 25-30%, normal cors by cath - ? Tachy-mediated;    . Non-obstructive CAD    a. 06/2015 Cath: LM nl, LAD 21m, LCX nl, RCA nl, EF 25-30%.  . Tobacco use   . Vitamin D deficiency    Current Status: Since his last office visit, he has c/o increasing chronic back pain today, otherwise he is doing well with no complaints. He denies fevers, chills, fatigue, recent infections, weight loss, and night sweats. He has not had any headaches, visual changes, dizziness, and falls. No chest pain, heart palpitations, and cough reported. No reports of GI problems such as nausea, vomiting, diarrhea, and constipation. He has no reports of blood in stools, dysuria and hematuria. No depression or anxiety reported today.   Past Surgical History:  Procedure Laterality Date  . CARDIAC CATHETERIZATION N/A 07/26/2015   Procedure: Left Heart Cath and Coronary Angiography;  Surgeon: Corky Crafts, MD;  Location: [facility] INVASIVE CV LAB;  Service: Cardiovascular;  Laterality: N/A;  . CARDIOVERSION N/A 04/09/2017   Procedure:  CARDIOVERSION;  Surgeon: Pricilla Riffle, MD;  Location: [facility] ENDOSCOPY;  Service: Cardiovascular;  Laterality: N/A;  . TEE WITHOUT CARDIOVERSION N/A 04/09/2017   Procedure: TRANSESOPHAGEAL ECHOCARDIOGRAM (TEE);  Surgeon: Pricilla Riffle, MD;  Location: [facility] ENDOSCOPY;  Service: Cardiovascular;  Laterality: N/A;    Family History  Problem Relation Age of Onset  . Hypertension Father     Social History   Socioeconomic History  . Marital status: Single    Spouse name: Not on file  . Number of children: Not on file  . Years of education: Not on file  . Highest education level: Not on file  Occupational History    Employer: [employer]  Social Needs  . Financial resource strain: Not on file  . Food insecurity    Worry: Not on file    Inability: Not on file  . Transportation needs    Medical: Not on file    Non-medical: Not on file  Tobacco Use  . Smoking status: Former Smoker    Packs/day: 0.50    Years: 40.00    Pack years: 20.00    Types: Cigarettes  . Smokeless tobacco: Never Used  . Tobacco comment: Quit in 2014  Substance and Sexual Activity  . Alcohol use: Yes    Alcohol/week: 0.0 standard drinks    Comment: 2-3 drinks per week.  . Drug use: Yes    Comment: Not currently. Quit in 2014. Used Cocaine for  20+ years.  . Sexual activity: Never  Lifestyle  . Physical activity    Days per week: Not on file    Minutes per session: Not on file  . Stress: Not on file  Relationships  . Social Musician on phone: Not on file    Gets together: Not on file    Attends religious service: Not on file    Active member of club or organization: Not on file    Attends meetings of clubs or organizations: Not on file    Relationship status: Not on file  . Intimate partner violence    Fear of current or ex partner: Not on file    Emotionally abused: Not on file    Physically abused: Not on file    Forced sexual activity: Not on file  Other Topics Concern  . Not on file   Social History Narrative  . Not on file    Outpatient Medications Prior to Visit  Medication Sig Dispense Refill  . albuterol (PROVENTIL HFA;VENTOLIN HFA) 108 (90 Base) MCG/ACT inhaler Inhale 2 puffs every 6 (six) hours as needed into the lungs for wheezing or shortness of breath. 1 Inhaler 3  . albuterol (PROVENTIL) (2.5 MG/3ML) 0.083% nebulizer solution Take 3 mLs (2.5 mg total) by nebulization every 6 (six) hours as needed for wheezing or shortness of breath. 75 mL 12  . apixaban (ELIQUIS) 5 MG TABS tablet Take 1 tablet (5 mg total) by mouth 2 (two) times daily. 60 tablet 12  . Cetirizine HCl (ZYRTEC ALLERGY) 10 MG CAPS Take 1 capsule (10 mg total) by mouth daily. 30 capsule 6  . cyclobenzaprine (FLEXERIL) 10 MG tablet Take 1 tablet (10 mg total) by mouth 3 (three) times daily as needed for muscle spasms. 30 tablet 3  . furosemide (LASIX) 40 MG tablet Take 1 tablet (40 mg total) by mouth 2 (two) times daily. 180 tablet 3  . lansoprazole (PREVACID) 30 MG capsule Take 30 mg by mouth daily as needed (for heartburn/indigestion).    . montelukast (SINGULAIR) 10 MG tablet TAKE 1 TABLET BY MOUTH EVERYDAY AT BEDTIME 90 tablet 1  . sacubitril-valsartan (ENTRESTO) 49-51 MG Take 1 tablet by mouth 2 (two) times daily. 180 tablet 3  . metoprolol succinate (TOPROL-XL) 50 MG 24 hr tablet Take 2 tablets (100mg ) in the morning, 1 tablet (50mg ) in the evening with or after meal. (Patient not taking: Reported on 06/07/2019) 90 tablet 11  . spironolactone (ALDACTONE) 25 MG tablet TAKE 1/2 TO 1 (ONE-HALF TO ONE) TABLET BY MOUTH ONCE DAILY AS  DIRECTED. Please make appt for future refills. [phone]. 1st attempt. (Patient not taking: Reported on 06/07/2019) 30 tablet 0  . Vitamin D, Ergocalciferol, (DRISDOL) 1.25 MG (50000 UT) CAPS capsule Take 1 capsule (50,000 Units total) by mouth every 7 (seven) days. (Patient not taking: Reported on 03/31/2019) 5 capsule 3   No facility-administered medications prior to  visit.     No Known Allergies  ROS Review of Systems  Constitutional: Negative.   HENT: Negative.   Eyes: Negative.   Respiratory: Positive for shortness of breath (on exertion).   Cardiovascular: Negative.   Gastrointestinal: Positive for abdominal distention.  Endocrine: Negative.   Genitourinary: Negative.   Musculoskeletal: Positive for arthralgias (generalized) and back pain (chronic).  Skin: Negative.   Allergic/Immunologic: Negative.   Neurological: Positive for dizziness (occasional ) and headaches (occasional ).  Hematological: Negative.   Psychiatric/Behavioral: Negative.       Objective:  Physical Exam  Constitutional: He is oriented to person, place, and time. He appears well-developed and well-nourished.  HENT:  Head: Normocephalic and atraumatic.  Eyes: Conjunctivae are normal.  Neck: Normal range of motion. Neck supple.  Cardiovascular:  Chronic a-fib  Pulmonary/Chest: Effort normal and breath sounds normal.  Abdominal: Soft. Bowel sounds are normal. He exhibits distension (obese).  Musculoskeletal: Normal range of motion.  Neurological: He is alert and oriented to person, place, and time. He has normal reflexes.  Skin: Skin is warm and dry.  Psychiatric: He has a normal mood and affect. His behavior is normal. Judgment and thought content normal.  Nursing note and vitals reviewed.   BP 140/82 (BP Location: Left Arm, Patient Position: Sitting, Cuff Size: Large)   Pulse 98   Temp 98.6 F (37 C) (Oral)   Resp 18   Ht 6\' 1"  (1.854 m)   Wt 273 lb (123.8 kg)   SpO2 97%   BMI 36.02 kg/m  Wt Readings from Last 3 Encounters:  06/07/19 273 lb (123.8 kg)  03/31/19 264 lb (119.7 kg)  12/20/18 259 lb (117.5 kg)     Health Maintenance Due  Topic Date Due  . Hepatitis C Screening  Feb 21, 1956  . COLONOSCOPY  02/12/2004  . PNA vac Low Risk Adult (1 of 2 - PCV13) 02/12/2019  . INFLUENZA VACCINE  02/25/2019    There are no preventive care reminders  to display for this patient.  Lab Results  Component Value Date   TSH 2.050 12/20/2018   Lab Results  Component Value Date   WBC 6.7 12/20/2018   HGB 13.5 12/20/2018   HCT 41.2 12/20/2018   MCV 80 12/20/2018   PLT 287 12/20/2018   Lab Results  Component Value Date   NA 141 12/20/2018   K 3.4 (L) 12/20/2018   CO2 25 12/20/2018   GLUCOSE 102 (H) 12/20/2018   BUN 8 12/20/2018   CREATININE 1.18 12/20/2018   BILITOT 0.5 12/20/2018   ALKPHOS 139 (H) 12/20/2018   AST 14 12/20/2018   ALT 11 12/20/2018   PROT 7.4 12/20/2018   ALBUMIN 4.1 12/20/2018   CALCIUM 9.5 12/20/2018   ANIONGAP 7 04/10/2017   Lab Results  Component Value Date   CHOL 169 12/20/2018   Lab Results  Component Value Date   HDL 29 (L) 12/20/2018   Lab Results  Component Value Date   LDLCALC 104 (H) 12/20/2018   Lab Results  Component Value Date   TRIG 182 (H) 12/20/2018   Lab Results  Component Value Date   CHOLHDL 5.8 (H) 12/20/2018   Lab Results  Component Value Date   HGBA1C 6.3 (A) 12/20/2018      Assessment & Plan:   1. Annual physical exam Limited ROM of spine, otherwise, normal physical with no abnormalities noted.   2. Congestive heart failure, unspecified HF chronicity, unspecified heart failure type ([place]) Stable today. No signs or symptoms of recurrence noted or reported.   3. Mild asthma without complication, unspecified whether persistent Stable.   4. Persistent atrial fibrillation (HCC) Stable.  5. Obese abdomen Increased abdominal girth. Probable cause of increasing back pain. He will began to eat healthier meals and decrease foods high in sugar.   6. Class 2 severe obesity due to excess calories with serious comorbidity and body mass index (BMI) of 36.0 to 36.9 in adult [facility]) Body mass index is 36.02 kg/m. Goal BMI  is <30. Encouraged efforts to reduce weight include engaging in physical activity  as tolerated with goal of 150 minutes per week. Improve dietary choices  and eat a meal regimen consistent with a Mediterranean or DASH diet. Reduce simple carbohydrates. Do not skip meals and eat healthy snacks throughout the day to avoid over-eating at dinner. Set a goal weight loss that is achievable for you.  7. Follow up He will follow up in 2 months.   No orders of the defined types were placed in this encounter.   Orders Placed This Encounter  Procedures  . Urinalysis Dipstick    Referral Orders  No referral(s) requested today    Raliegh IpNatalie Zelphia Glover,  MSN, FNP-BC [facility] [address]  [place], [place] [id number] [phone] fax   Problem List Items Addressed This Visit      Cardiovascular and Mediastinum   CHF (congestive heart failure) (HCC)   Persistent atrial fibrillation (HCC)     Respiratory   Asthma    Other Visit Diagnoses    Annual physical exam    -  Primary   Relevant Orders   Urinalysis Dipstick (Completed)   Obese abdomen       Class 2 severe obesity due to excess calories with serious comorbidity and body mass index (BMI) of 36.0 to 36.9 in adult [facility](HCC)       Follow up          No orders of the defined types were placed in this encounter.   Follow-up: Return in about 2 months (around 08/07/2019).    Kallie LocksNatalie M Nyree Applegate, FNP

## 2019-06-08 DIAGNOSIS — E66812 Obesity, class 2: Secondary | ICD-10-CM | POA: Insufficient documentation

## 2019-06-08 DIAGNOSIS — E65 Localized adiposity: Secondary | ICD-10-CM | POA: Insufficient documentation

## 2019-06-08 NOTE — Patient Instructions (Signed)

## 2019-07-13 ENCOUNTER — Other Ambulatory Visit: Payer: Self-pay | Admitting: Family Medicine

## 2019-07-13 ENCOUNTER — Other Ambulatory Visit: Payer: Self-pay | Admitting: Internal Medicine

## 2019-07-13 DIAGNOSIS — J45909 Unspecified asthma, uncomplicated: Secondary | ICD-10-CM

## 2019-08-07 ENCOUNTER — Ambulatory Visit: Payer: Medicare Other | Admitting: Family Medicine

## 2019-09-25 DIAGNOSIS — R739 Hyperglycemia, unspecified: Secondary | ICD-10-CM

## 2019-09-25 DIAGNOSIS — E785 Hyperlipidemia, unspecified: Secondary | ICD-10-CM

## 2019-09-25 HISTORY — DX: Hyperlipidemia, unspecified: E78.5

## 2019-09-25 HISTORY — DX: Hyperglycemia, unspecified: R73.9

## 2019-09-28 ENCOUNTER — Telehealth: Payer: Self-pay | Admitting: Family Medicine

## 2019-09-28 NOTE — Telephone Encounter (Signed)
Diane from [facility] called with pt's Hemaglobin A1C results. Score was a 7.3. Says the results were elevated.

## 2019-09-29 NOTE — Telephone Encounter (Signed)
Message left for patient to call and schedule an appointment

## 2019-10-06 ENCOUNTER — Other Ambulatory Visit: Payer: Self-pay | Admitting: Family Medicine

## 2019-10-06 DIAGNOSIS — J302 Other seasonal allergic rhinitis: Secondary | ICD-10-CM

## 2019-10-06 DIAGNOSIS — J45909 Unspecified asthma, uncomplicated: Secondary | ICD-10-CM

## 2019-10-10 ENCOUNTER — Other Ambulatory Visit: Payer: Self-pay | Admitting: Family Medicine

## 2019-10-10 ENCOUNTER — Other Ambulatory Visit: Payer: Self-pay

## 2019-10-10 ENCOUNTER — Ambulatory Visit (INDEPENDENT_AMBULATORY_CARE_PROVIDER_SITE_OTHER): Payer: Medicare Other | Admitting: Family Medicine

## 2019-10-10 ENCOUNTER — Encounter: Payer: Self-pay | Admitting: Family Medicine

## 2019-10-10 VITALS — BP 114/78 | HR 81 | Temp 98.2°F | Ht 73.0 in | Wt 276.8 lb

## 2019-10-10 DIAGNOSIS — J45909 Unspecified asthma, uncomplicated: Secondary | ICD-10-CM | POA: Diagnosis not present

## 2019-10-10 DIAGNOSIS — M62838 Other muscle spasm: Secondary | ICD-10-CM

## 2019-10-10 DIAGNOSIS — E559 Vitamin D deficiency, unspecified: Secondary | ICD-10-CM

## 2019-10-10 DIAGNOSIS — Z09 Encounter for follow-up examination after completed treatment for conditions other than malignant neoplasm: Secondary | ICD-10-CM

## 2019-10-10 DIAGNOSIS — R7303 Prediabetes: Secondary | ICD-10-CM | POA: Diagnosis not present

## 2019-10-10 DIAGNOSIS — E119 Type 2 diabetes mellitus without complications: Secondary | ICD-10-CM

## 2019-10-10 DIAGNOSIS — I4819 Other persistent atrial fibrillation: Secondary | ICD-10-CM

## 2019-10-10 DIAGNOSIS — I10 Essential (primary) hypertension: Secondary | ICD-10-CM

## 2019-10-10 DIAGNOSIS — I509 Heart failure, unspecified: Secondary | ICD-10-CM | POA: Diagnosis not present

## 2019-10-10 DIAGNOSIS — R0981 Nasal congestion: Secondary | ICD-10-CM

## 2019-10-10 DIAGNOSIS — J302 Other seasonal allergic rhinitis: Secondary | ICD-10-CM

## 2019-10-10 DIAGNOSIS — T7840XA Allergy, unspecified, initial encounter: Secondary | ICD-10-CM

## 2019-10-10 LAB — POCT URINALYSIS DIPSTICK
Bilirubin, UA: NEGATIVE
Glucose, UA: NEGATIVE
Ketones, UA: NEGATIVE
Leukocytes, UA: NEGATIVE
Nitrite, UA: NEGATIVE
Protein, UA: NEGATIVE
Spec Grav, UA: >=1.03 — AB (ref 1.010–1.025)
Urobilinogen, UA: 1 E.U./dL
pH, UA: 5.5 (ref 5.0–8.0)

## 2019-10-10 LAB — GLUCOSE, POCT (MANUAL RESULT ENTRY): POC Glucose: 174 mg/dl — AB (ref 70–99)

## 2019-10-10 LAB — POCT GLYCOSYLATED HEMOGLOBIN (HGB A1C): Hemoglobin A1C: 7.6 % — AB (ref 4.0–5.6)

## 2019-10-10 MED ORDER — FLUTICASONE PROPIONATE 50 MCG/ACT NA SUSP: 2.0000 | Freq: Every day | NASAL | 6 refills | Status: DC

## 2019-10-10 MED ORDER — VITAMIN D (ERGOCALCIFEROL) 1.25 MG (50000 UNIT) PO CAPS: 50000.0000 [IU] | ORAL_CAPSULE | ORAL | 3 refills | Status: DC

## 2019-10-10 MED ORDER — AMOXICILLIN-POT CLAVULANATE 875-125 MG PO TABS: 1.0000 | ORAL_TABLET | Freq: Two times a day (BID) | ORAL | 0 refills | Status: AC

## 2019-10-10 NOTE — Progress Notes (Signed)
[facility]   Established Patient Office Visit  Subjective:  Patient ID: Kevin [name], male    DOB: Sep 22, 1955  Age: 64 y.o. MRN: [id number]  CC:  Chief Complaint  Patient presents with  . Follow-up    HPI Kevin [name] is a 64 year old male who presents for Follow Up today.   Past Medical History:  Diagnosis Date  . Asthma   . Chronic systolic CHF (congestive heart failure) (HCC)    a. 06/2015 Echo: EF 25-30%, mod-sev MR, PASP . b. 08/2016: echo showing EF of 20-25% with diffuse HK  . Cocaine use   . Essential hypertension   . Longstanding persistent atrial fibrillation (HCC)    a. Initially dx 06/2015, paroxysmal at that time. b. Recurrent in Feb 2018 and beyond.  . Moderate mitral regurgitation    a. 06/2015 Echo: Mod-Sev MR. b. 08/2016: echo showing moderate MR.   Marland Kitchen NICM (nonischemic cardiomyopathy) (HCC)    a. 06/2015 Echo: EF 25-30%, normal cors by cath - ? Tachy-mediated;    . Non-obstructive CAD    a. 06/2015 Cath: LM nl, LAD 34m, LCX nl, RCA nl, EF 25-30%.  . Tobacco use   . Vitamin D deficiency    Current Status: Since his last office visit, he is doing well with no complaints. Patient recently did Cologuard home test last week. No reports of GI problems such as nausea, vomiting, diarrhea, and constipation. He has no reports of blood in stools, dysuria and hematuria. He denies fevers, chills, fatigue, recent infections, weight loss, and night sweats. He has not had any headaches, visual changes, dizziness, and falls. No chest pain, heart palpitations, cough and shortness of breath reported. . No depression or anxiety, and denies suicidal ideations, homicidal ideations, or auditory hallucinations. He denies pain today.   Past Surgical History:  Procedure Laterality Date  . CARDIAC CATHETERIZATION N/A 07/26/2015   Procedure: Left Heart Cath and Coronary Angiography;  Surgeon: Corky Crafts, MD;  [facility]  INVASIVE CV LAB;  Service: Cardiovascular;  Laterality: N/A;  . CARDIOVERSION N/A 04/09/2017   Procedure: CARDIOVERSION;  Surgeon: Pricilla Riffle, MD;  Location: [facility] ENDOSCOPY;  Service: Cardiovascular;  Laterality: N/A;  . TEE WITHOUT CARDIOVERSION N/A 04/09/2017   Procedure: TRANSESOPHAGEAL ECHOCARDIOGRAM (TEE);  Surgeon: Pricilla Riffle, MD;  Location: [facility] ENDOSCOPY;  Service: Cardiovascular;  Laterality: N/A;    Family History  Problem Relation Age of Onset  . Hypertension Father     Social History   Socioeconomic History  . Marital status: Single    Spouse name: Not on file  . Number of children: Not on file  . Years of education: Not on file  . Highest education level: Not on file  Occupational History    Employer: [employer]  Tobacco Use  . Smoking status: Former Smoker    Packs/day: 0.50    Years: 40.00    Pack years: 20.00    Types: Cigarettes  . Smokeless tobacco: Never Used  . Tobacco comment: Quit in 2014  Substance and Sexual Activity  . Alcohol use: Yes    Alcohol/week: 0.0 standard drinks    Comment: 2-3 drinks per week.  . Drug use: Yes    Comment: Not currently. Quit in 2014. 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  . Sexual activity: Not Currently  Other Topics Concern  . Not on file  Social History Narrative  . Not on file   Social Determinants of Health  Financial Resource Strain:   . Difficulty of Paying Living Expenses:   Food Insecurity:   . Worried About Charity fundraiser in the Last Year:   . Arboriculturist in the Last Year:   Transportation Needs:   . Film/video editor (Medical):   Marland Kitchen Lack of Transportation (Non-Medical):   Physical Activity:   . Days of Exercise per Week:   . Minutes of Exercise per Session:   Stress:   . Feeling of Stress :   Social Connections:   . Frequency of Communication with Friends and Family:   . Frequency of Social Gatherings with Friends and Family:   . Attends Religious Services:   . Active Member of Clubs or  Organizations:   . Attends Archivist Meetings:   Marland Kitchen Marital Status:   Intimate Partner Violence:   . Fear of Current or Ex-Partner:   . Emotionally Abused:   Marland Kitchen Physically Abused:   . Sexually Abused:     Outpatient Medications Prior to Visit  Medication Sig Dispense Refill  . albuterol (PROVENTIL HFA;VENTOLIN HFA) 108 (90 Base) MCG/ACT inhaler Inhale 2 puffs every 6 (six) hours as needed into the lungs for wheezing or shortness of breath. 1 Inhaler 3  . albuterol (PROVENTIL) (2.5 MG/3ML) 0.083% nebulizer solution Take 3 mLs (2.5 mg total) by nebulization every 6 (six) hours as needed for wheezing or shortness of breath. 75 mL 12  . apixaban (ELIQUIS) 5 MG TABS tablet Take 1 tablet (5 mg total) by mouth 2 (two) times daily. 60 tablet 12  . cetirizine (ZYRTEC) 10 MG tablet TAKE 1 TABLET BY MOUTH EVERY DAY 90 tablet 2  . furosemide (LASIX) 40 MG tablet TAKE 1 TABLET BY MOUTH TWICE A DAY 60 tablet 8  . lansoprazole (PREVACID) 30 MG capsule Take 30 mg by mouth daily as needed (for heartburn/indigestion).    . montelukast (SINGULAIR) 10 MG tablet TAKE 1 TABLET BY MOUTH EVERYDAY AT BEDTIME 90 tablet 1  . sacubitril-valsartan (ENTRESTO) 49-51 MG Take 1 tablet by mouth 2 (two) times daily. 180 tablet 3  . spironolactone (ALDACTONE) 25 MG tablet TAKE 1/2 TO 1 (ONE-HALF TO ONE) TABLET BY MOUTH ONCE DAILY AS  DIRECTED. Please make appt for future refills. [phone]. 1st attempt. 30 tablet 0  . cyclobenzaprine (FLEXERIL) 10 MG tablet Take 1 tablet (10 mg total) by mouth 3 (three) times daily as needed for muscle spasms. 30 tablet 3  . Vitamin D, Ergocalciferol, (DRISDOL) 1.25 MG (50000 UT) CAPS capsule Take 1 capsule (50,000 Units total) by mouth every 7 (seven) days. 5 capsule 3  . metoprolol succinate (TOPROL-XL) 50 MG 24 hr tablet Take 2 tablets (100mg ) in the morning, 1 tablet (50mg ) in the evening with or after meal. (Patient not taking: Reported on 06/07/2019) 90 tablet 11   No  facility-administered medications prior to visit.    No Known Allergies  ROS Review of Systems  Constitutional: Negative.   HENT: Negative.   Eyes: Negative.   Respiratory: Negative.   Cardiovascular: Negative.   Gastrointestinal: Negative.   Endocrine: Negative.   Genitourinary: Negative.   Musculoskeletal: Negative.   Skin: Negative.   Allergic/Immunologic: Negative.   Neurological: Negative.   Hematological: Negative.   Psychiatric/Behavioral: Negative.       Objective:    Physical Exam  Constitutional: He is oriented to person, place, and time. He appears well-developed and well-nourished.  HENT:  Head: Normocephalic and atraumatic.  Eyes: Conjunctivae are normal.  Cardiovascular:  Normal rate, regular rhythm, normal heart sounds and intact distal pulses.  Pulmonary/Chest: Effort normal and breath sounds normal.  Abdominal: Soft. Bowel sounds are normal.  Musculoskeletal:        General: Normal range of motion.     Cervical back: Normal range of motion and neck supple.  Neurological: He is alert and oriented to person, place, and time. He has normal reflexes.  Skin: Skin is warm and dry.  Psychiatric: He has a normal mood and affect. His behavior is normal. Judgment and thought content normal.  Nursing note and vitals reviewed.   BP 114/78   Pulse 81   Temp 98.2 F (36.8 C) (Oral)   Ht 6\' 1"  (1.854 m)   Wt 276 lb 12.8 oz (125.6 kg)   SpO2 100%   BMI 36.52 kg/m  Wt Readings from Last 3 Encounters:  10/10/19 276 lb 12.8 oz (125.6 kg)  06/07/19 273 lb (123.8 kg)  03/31/19 264 lb (119.7 kg)     Health Maintenance Due  Topic Date Due  . Hepatitis C Screening  Never done  . URINE MICROALBUMIN  Never done  . COLONOSCOPY  Never done  . PNA vac Low Risk Adult (1 of 2 - PCV13) 02/12/2019  . INFLUENZA VACCINE  Never done    There are no preventive care reminders to display for this patient.  Lab Results  Component Value Date   TSH 2.000 10/10/2019    Lab Results  Component Value Date   WBC 7.3 10/10/2019   HGB 14.7 10/10/2019   HCT 44.8 10/10/2019   MCV 82 10/10/2019   PLT 190 10/10/2019   Lab Results  Component Value Date   NA 140 10/10/2019   K 4.1 10/10/2019   CO2 25 10/10/2019   GLUCOSE 154 (H) 10/10/2019   BUN 11 10/10/2019   CREATININE 1.11 10/10/2019   BILITOT 0.7 10/10/2019   ALKPHOS 133 (H) 10/10/2019   AST 13 10/10/2019   ALT 13 10/10/2019   PROT 7.2 10/10/2019   ALBUMIN 4.0 10/10/2019   CALCIUM 9.5 10/10/2019   ANIONGAP 7 04/10/2017   Lab Results  Component Value Date   CHOL 190 10/10/2019   Lab Results  Component Value Date   HDL 38 (L) 10/10/2019   Lab Results  Component Value Date   LDLCALC 123 (H) 10/10/2019   Lab Results  Component Value Date   TRIG 162 (H) 10/10/2019   Lab Results  Component Value Date   CHOLHDL 5.0 10/10/2019   Lab Results  Component Value Date   HGBA1C 7.6 (A) 10/10/2019     Assessment & Plan:   1. Essential hypertension The current medical regimen is effective; blood pressure is stable at 114/78 today; continue present plan and medications as prescribed. He will continue to take medications as prescribed, to decrease high sodium intake, excessive alcohol intake, increase potassium intake, smoking cessation, and increase physical activity of at least 30 minutes of cardio activity daily. He will continue to follow Heart Healthy or DASH diet. - CBC with Differential - Comprehensive metabolic panel - Lipid Panel - TSH - Vitamin B12 - Vitamin D, 25-hydroxy - PSA - POCT glycosylated hemoglobin (Hb A1C) - POCT urinalysis dipstick - POCT glucose (manual entry)  2. Prediabetes  3. Hemoglobin A1C between 7% and 9% indicating borderline diabetic control Hgb A1c at 7.6 today. Monitor.   4. Persistent atrial fibrillation (HCC)  5. Congestive heart failure, unspecified HF chronicity, unspecified heart failure type (HCC) Stable today. No signs  or symptoms of  respiratory distress noted or reported.   6. Mild asthma without complication, unspecified whether persistent We will initiate antibiotic today.  - amoxicillin-clavulanate (AUGMENTIN) 875-125 MG tablet; Take 1 tablet by mouth 2 (two) times daily for 7 days.  Dispense: 14 tablet; Refill: 0  7. Allergy, initial encounter We will initiate corticosteroid nasal spray today.  - fluticasone (FLONASE) 50 MCG/ACT nasal spray; Place 2 sprays into both nostrils daily.  Dispense: 16 g; Refill: 6  8. Sinus congestion - amoxicillin-clavulanate (AUGMENTIN) 875-125 MG tablet; Take 1 tablet by mouth 2 (two) times daily for 7 days.  Dispense: 14 tablet; Refill: 0  9. Vitamin D deficiency - Vitamin D, Ergocalciferol, (DRISDOL) 1.25 MG (50000 UNIT) CAPS capsule; Take 1 capsule (50,000 Units total) by mouth every 7 (seven) days.  Dispense: 5 capsule; Refill: 3  10. Follow up He will follow up in 6 months.   Meds ordered this encounter  Medications  . Vitamin D, Ergocalciferol, (DRISDOL) 1.25 MG (50000 UNIT) CAPS capsule    Sig: Take 1 capsule (50,000 Units total) by mouth every 7 (seven) days.    Dispense:  5 capsule    Refill:  3  . fluticasone (FLONASE) 50 MCG/ACT nasal spray    Sig: Place 2 sprays into both nostrils daily.    Dispense:  16 g    Refill:  6  . amoxicillin-clavulanate (AUGMENTIN) 875-125 MG tablet    Sig: Take 1 tablet by mouth 2 (two) times daily for 7 days.    Dispense:  14 tablet    Refill:  0   Orders Placed This Encounter  Procedures  . CBC with Differential  . Comprehensive metabolic panel  . Lipid Panel  . TSH  . Vitamin B12  . Vitamin D, 25-hydroxy  . PSA  . POCT glycosylated hemoglobin (Hb A1C)  . POCT urinalysis dipstick  . POCT glucose (manual entry)    Referral Orders  No referral(s) requested today   Raliegh Ip,  MSN, FNP-BC [facility] [address] [place], [place]  [address] [phone]- fax   Problem List Items Addressed This Visit      Cardiovascular and Mediastinum   CHF (congestive heart failure) (HCC)   Essential hypertension - Primary   Relevant Orders   CBC with Differential (Completed)   Comprehensive metabolic panel (Completed)   Lipid Panel (Completed)   TSH (Completed)   Vitamin B12 (Completed)   Vitamin D, 25-hydroxy (Completed)   PSA (Completed)   POCT glycosylated hemoglobin (Hb A1C) (Completed)   POCT urinalysis dipstick (Completed)   POCT glucose (manual entry) (Completed)   Persistent atrial fibrillation (HCC)     Respiratory   Asthma   Relevant Medications   amoxicillin-clavulanate (AUGMENTIN) 875-125 MG tablet   Sinus congestion   Relevant Medications   amoxicillin-clavulanate (AUGMENTIN) 875-125 MG tablet    Other Visit Diagnoses    Prediabetes       Hemoglobin A1C between 7% and 9% indicating borderline diabetic control       Allergy, initial encounter       Relevant Medications   fluticasone (FLONASE) 50 MCG/ACT nasal spray   Vitamin D deficiency       Relevant Medications   Vitamin D, Ergocalciferol, (DRISDOL) 1.25 MG (50000 UNIT) CAPS capsule   Follow up          Meds ordered this encounter  Medications  . Vitamin D, Ergocalciferol, (DRISDOL) 1.25 MG (  50000 UNIT) CAPS capsule    Sig: Take 1 capsule (50,000 Units total) by mouth every 7 (seven) days.    Dispense:  5 capsule    Refill:  3  . fluticasone (FLONASE) 50 MCG/ACT nasal spray    Sig: Place 2 sprays into both nostrils daily.    Dispense:  16 g    Refill:  6  . amoxicillin-clavulanate (AUGMENTIN) 875-125 MG tablet    Sig: Take 1 tablet by mouth 2 (two) times daily for 7 days.    Dispense:  14 tablet    Refill:  0    Follow-up: Return in about 6 months (around 04/11/2020).    Kallie Locks, FNP

## 2019-10-10 NOTE — Telephone Encounter (Signed)
Refill request for cyclobenzaprine. Please advise.  

## 2019-10-11 LAB — CBC WITH DIFFERENTIAL/PLATELET
Basophils Absolute: 0 10*3/uL (ref 0.0–0.2)
Basos: 1 %
EOS (ABSOLUTE): 0.2 10*3/uL (ref 0.0–0.4)
Eos: 2 %
Hematocrit: 44.8 % (ref 37.5–51.0)
Hemoglobin: 14.7 g/dL (ref 13.0–17.7)
Immature Grans (Abs): 0 10*3/uL (ref 0.0–0.1)
Immature Granulocytes: 0 %
Lymphocytes Absolute: 1.6 10*3/uL (ref 0.7–3.1)
Lymphs: 22 %
MCH: 27 pg (ref 26.6–33.0)
MCHC: 32.8 g/dL (ref 31.5–35.7)
MCV: 82 fL (ref 79–97)
Monocytes Absolute: 0.7 10*3/uL (ref 0.1–0.9)
Monocytes: 10 %
Neutrophils Absolute: 4.8 10*3/uL (ref 1.4–7.0)
Neutrophils: 65 %
Platelets: 190 10*3/uL (ref 150–450)
RBC: 5.45 x10E6/uL (ref 4.14–5.80)
RDW: 15 % (ref 11.6–15.4)
WBC: 7.3 10*3/uL (ref 3.4–10.8)

## 2019-10-11 LAB — COMPREHENSIVE METABOLIC PANEL
ALT: 13 IU/L (ref 0–44)
AST: 13 IU/L (ref 0–40)
Albumin/Globulin Ratio: 1.3 (ref 1.2–2.2)
Albumin: 4 g/dL (ref 3.8–4.8)
Alkaline Phosphatase: 133 IU/L — ABNORMAL HIGH (ref 39–117)
BUN/Creatinine Ratio: 10 (ref 10–24)
BUN: 11 mg/dL (ref 8–27)
Bilirubin Total: 0.7 mg/dL (ref 0.0–1.2)
CO2: 25 mmol/L (ref 20–29)
Calcium: 9.5 mg/dL (ref 8.6–10.2)
Chloride: 101 mmol/L (ref 96–106)
Creatinine, Ser: 1.11 mg/dL (ref 0.76–1.27)
GFR calc Af Amer: 80 mL/min/{1.73_m2} (ref >59)
GFR calc non Af Amer: 69 mL/min/{1.73_m2} (ref >59)
Globulin, Total: 3.2 g/dL (ref 1.5–4.5)
Glucose: 154 mg/dL — ABNORMAL HIGH (ref 65–99)
Potassium: 4.1 mmol/L (ref 3.5–5.2)
Sodium: 140 mmol/L (ref 134–144)
Total Protein: 7.2 g/dL (ref 6.0–8.5)

## 2019-10-11 LAB — LIPID PANEL
Chol/HDL Ratio: 5 ratio (ref 0.0–5.0)
Cholesterol, Total: 190 mg/dL (ref 100–199)
HDL: 38 mg/dL — ABNORMAL LOW (ref >39)
LDL Chol Calc (NIH): 123 mg/dL — ABNORMAL HIGH (ref 0–99)
Triglycerides: 162 mg/dL — ABNORMAL HIGH (ref 0–149)
VLDL Cholesterol Cal: 29 mg/dL (ref 5–40)

## 2019-10-11 LAB — TSH: TSH: 2 u[IU]/mL (ref 0.450–4.500)

## 2019-10-11 LAB — VITAMIN D 25 HYDROXY (VIT D DEFICIENCY, FRACTURES): Vit D, 25-Hydroxy: 9.9 ng/mL — ABNORMAL LOW (ref 30.0–100.0)

## 2019-10-11 LAB — VITAMIN B12: Vitamin B-12: 502 pg/mL (ref 232–1245)

## 2019-10-11 LAB — PSA: Prostate Specific Ag, Serum: 3.6 ng/mL (ref 0.0–4.0)

## 2019-10-17 ENCOUNTER — Encounter: Payer: Self-pay | Admitting: Family Medicine

## 2019-10-17 ENCOUNTER — Other Ambulatory Visit: Payer: Self-pay | Admitting: Family Medicine

## 2019-10-17 DIAGNOSIS — E782 Mixed hyperlipidemia: Secondary | ICD-10-CM

## 2019-10-17 MED ORDER — ATORVASTATIN CALCIUM 10 MG PO TABS: 10.0000 mg | ORAL_TABLET | Freq: Every day | ORAL | 6 refills | Status: DC

## 2019-11-03 ENCOUNTER — Telehealth: Payer: Self-pay | Admitting: Internal Medicine

## 2019-11-03 NOTE — Telephone Encounter (Signed)
Left message for patient to call the office to get 12 month follow up appt. scheduled with Dr. [name].

## 2019-11-10 ENCOUNTER — Encounter: Payer: Self-pay | Admitting: Family Medicine

## 2020-01-04 ENCOUNTER — Telehealth: Payer: Self-pay | Admitting: Family Medicine

## 2020-01-04 NOTE — Telephone Encounter (Signed)
Pt called needing a prescription for a albuterol inhaler.

## 2020-01-05 ENCOUNTER — Telehealth: Payer: Self-pay | Admitting: Internal Medicine

## 2020-01-05 ENCOUNTER — Other Ambulatory Visit: Payer: Self-pay | Admitting: Family Medicine

## 2020-01-05 DIAGNOSIS — J449 Chronic obstructive pulmonary disease, unspecified: Secondary | ICD-10-CM

## 2020-01-05 MED ORDER — ALBUTEROL SULFATE (2.5 MG/3ML) 0.083% IN NEBU: 2.5000 mg | INHALATION_SOLUTION | Freq: Four times a day (QID) | RESPIRATORY_TRACT | 11 refills | Status: DC | PRN

## 2020-01-05 MED ORDER — ALBUTEROL SULFATE HFA 108 (90 BASE) MCG/ACT IN AERS: 2.0000 | INHALATION_SPRAY | Freq: Four times a day (QID) | RESPIRATORY_TRACT | 11 refills | Status: DC | PRN

## 2020-01-05 NOTE — Telephone Encounter (Signed)
°*  STAT* If patient is at the pharmacy, call can be transferred to refill team.   1. Which medications need to be refilled? (please list name of each medication and dose if known)  albuterol (PROVENTIL HFA;VENTOLIN HFA) 108 (90 Base) MCG/ACT inhaler  2. Which pharmacy/location (including street and city if local pharmacy) is medication to be sent to? [id number] - [place], [place] - [address]  3. Do they need a 30 day or 90 day supply? 90 day supply

## 2020-01-05 NOTE — Telephone Encounter (Signed)
Called notified patient that he will call  Dr.Hilty office to request this medication , because he hasn't had this since 2018.

## 2020-01-05 NOTE — Telephone Encounter (Signed)
Pt is requesting an inhaler it's showing that was last filled 2018 by Dr.Hilty .

## 2020-03-04 ENCOUNTER — Other Ambulatory Visit: Payer: Self-pay | Admitting: Family Medicine

## 2020-03-04 DIAGNOSIS — E559 Vitamin D deficiency, unspecified: Secondary | ICD-10-CM

## 2020-03-31 ENCOUNTER — Other Ambulatory Visit: Payer: Self-pay | Admitting: Family Medicine

## 2020-03-31 DIAGNOSIS — J45909 Unspecified asthma, uncomplicated: Secondary | ICD-10-CM

## 2020-04-14 ENCOUNTER — Other Ambulatory Visit: Payer: Self-pay | Admitting: Family Medicine

## 2020-04-14 DIAGNOSIS — J302 Other seasonal allergic rhinitis: Secondary | ICD-10-CM

## 2020-04-14 DIAGNOSIS — J45909 Unspecified asthma, uncomplicated: Secondary | ICD-10-CM

## 2020-04-19 ENCOUNTER — Other Ambulatory Visit: Payer: Self-pay

## 2020-04-19 ENCOUNTER — Ambulatory Visit (INDEPENDENT_AMBULATORY_CARE_PROVIDER_SITE_OTHER): Payer: Medicare Other | Admitting: Family Medicine

## 2020-04-19 ENCOUNTER — Encounter: Payer: Self-pay | Admitting: Family Medicine

## 2020-04-19 VITALS — BP 141/73 | HR 65 | Temp 98.4°F | Resp 16 | Ht 73.0 in | Wt 254.6 lb

## 2020-04-19 DIAGNOSIS — J302 Other seasonal allergic rhinitis: Secondary | ICD-10-CM

## 2020-04-19 DIAGNOSIS — E65 Localized adiposity: Secondary | ICD-10-CM

## 2020-04-19 DIAGNOSIS — J449 Chronic obstructive pulmonary disease, unspecified: Secondary | ICD-10-CM | POA: Diagnosis not present

## 2020-04-19 DIAGNOSIS — R7309 Other abnormal glucose: Secondary | ICD-10-CM

## 2020-04-19 DIAGNOSIS — I10 Essential (primary) hypertension: Secondary | ICD-10-CM

## 2020-04-19 DIAGNOSIS — R739 Hyperglycemia, unspecified: Secondary | ICD-10-CM

## 2020-04-19 DIAGNOSIS — E1169 Type 2 diabetes mellitus with other specified complication: Secondary | ICD-10-CM | POA: Diagnosis not present

## 2020-04-19 DIAGNOSIS — Z09 Encounter for follow-up examination after completed treatment for conditions other than malignant neoplasm: Secondary | ICD-10-CM

## 2020-04-19 DIAGNOSIS — E782 Mixed hyperlipidemia: Secondary | ICD-10-CM

## 2020-04-19 DIAGNOSIS — Z794 Long term (current) use of insulin: Secondary | ICD-10-CM

## 2020-04-19 DIAGNOSIS — J45909 Unspecified asthma, uncomplicated: Secondary | ICD-10-CM

## 2020-04-19 DIAGNOSIS — I509 Heart failure, unspecified: Secondary | ICD-10-CM

## 2020-04-19 DIAGNOSIS — R634 Abnormal weight loss: Secondary | ICD-10-CM

## 2020-04-19 LAB — POCT GLYCOSYLATED HEMOGLOBIN (HGB A1C)
HbA1c POC (<> result, manual entry): 14.2 % (ref 4.0–5.6)
HbA1c, POC (controlled diabetic range): 14.2 % — AB (ref 0.0–7.0)
HbA1c, POC (prediabetic range): 14.2 % — AB (ref 5.7–6.4)
Hemoglobin A1C: 14.2 % — AB (ref 4.0–5.6)

## 2020-04-19 LAB — GLUCOSE, POCT (MANUAL RESULT ENTRY): POC Glucose: 514 mg/dl — AB (ref 70–99)

## 2020-04-19 MED ORDER — ALBUTEROL SULFATE (2.5 MG/3ML) 0.083% IN NEBU: 2.5000 mg | INHALATION_SOLUTION | Freq: Four times a day (QID) | RESPIRATORY_TRACT | 11 refills | Status: DC | PRN

## 2020-04-19 MED ORDER — ALBUTEROL SULFATE HFA 108 (90 BASE) MCG/ACT IN AERS: 2.0000 | INHALATION_SPRAY | Freq: Four times a day (QID) | RESPIRATORY_TRACT | 11 refills | Status: DC | PRN

## 2020-04-19 MED ORDER — METFORMIN HCL 1000 MG PO TABS: 1000.0000 mg | ORAL_TABLET | Freq: Two times a day (BID) | ORAL | 3 refills | Status: DC

## 2020-04-19 MED ORDER — BLOOD GLUCOSE METER KIT: PACK | 0 refills | Status: AC

## 2020-04-19 MED ORDER — GLIPIZIDE 10 MG PO TABS: 10.0000 mg | ORAL_TABLET | Freq: Two times a day (BID) | ORAL | 3 refills | Status: DC

## 2020-04-19 MED ORDER — ATORVASTATIN CALCIUM 10 MG PO TABS: 10.0000 mg | ORAL_TABLET | Freq: Every day | ORAL | 3 refills | Status: DC

## 2020-04-19 MED ORDER — APIXABAN 5 MG PO TABS
5.0000 mg | ORAL_TABLET | Freq: Two times a day (BID) | ORAL | 3 refills | Status: DC
Start: 2020-04-19 — End: 2021-05-09

## 2020-04-19 MED ORDER — ENTRESTO 49-51 MG PO TABS
1.0000 | ORAL_TABLET | Freq: Two times a day (BID) | ORAL | 3 refills | Status: DC
Start: 2020-04-19 — End: 2020-12-21

## 2020-04-19 MED ORDER — FUROSEMIDE 40 MG PO TABS
40.0000 mg | ORAL_TABLET | Freq: Two times a day (BID) | ORAL | 3 refills | Status: DC
Start: 2020-04-19 — End: 2020-12-21

## 2020-04-19 MED ORDER — LANSOPRAZOLE 30 MG PO CPDR
30.0000 mg | DELAYED_RELEASE_CAPSULE | Freq: Every day | ORAL | 3 refills | Status: DC | PRN
Start: 2020-04-19 — End: 2020-09-25

## 2020-04-19 MED ORDER — MONTELUKAST SODIUM 10 MG PO TABS: 10.0000 mg | ORAL_TABLET | Freq: Every day | ORAL | 3 refills | Status: DC

## 2020-04-19 NOTE — Progress Notes (Signed)
Patient [facility]    Established Patient Office Visit  Subjective:  Patient ID: Kevin [name], male    DOB: April 12, 1956  Age: 64 y.o. MRN: [id number]  CC:  Chief Complaint  Patient presents with  . Follow-up    Pt states he drinks alot of fluits and he still is thristy and he lost his appletite.X2wks. Pt state his vision is alittle burryX2wks    HPI Kevin [name] is a 64 year old male who presents for Follow Up today.   Patient Active Problem List   Diagnosis Date Noted  . Obese abdomen 06/08/2019  . Class 2 severe obesity due to excess calories with serious comorbidity and body mass index (BMI) of 36.0 to 36.9 in adult [facility]) 06/08/2019  . Muscle spasm 03/31/2019  . Sinus congestion 12/21/2018  . Tobacco use   . Non-obstructive CAD   . NICM (nonischemic cardiomyopathy) ([place])   . Moderate mitral regurgitation   . Cocaine use   . Asthma   . Persistent atrial fibrillation ([place])   . DCM (dilated cardiomyopathy) ([place])   . CHF (congestive heart failure) ([place]) 04/04/2017  . Current use of long term anticoagulation 02/24/2017  . Hypotension 02/24/2017  . CKD (chronic kidney disease), stage II 02/08/2017  . History of cocaine use 02/08/2017  . Former tobacco use 02/08/2017  . Atrial fibrillation with rapid ventricular response ([place])   . Longstanding persistent atrial fibrillation ([place]) 10/19/2016  . AKI (acute kidney injury) ([place]) 10/19/2016  . Mitral regurgitation 09/28/2016  . Essential hypertension 09/28/2016  . Acute on chronic systolic heart failure ([place]) 09/15/2016  . Non-ischemic cardiomyopathy ([place])   . Coronary artery disease involving native coronary artery of native heart without angina pectoris   . Chronic systolic heart failure ([place]) 07/26/2015  . Paroxysmal atrial fibrillation ([place]) 07/23/2015  . COPD (chronic obstructive pulmonary disease) ([place]) 07/23/2015  . Elevated troponin 07/23/2015  . Elevated brain natriuretic  peptide (BNP) level 07/23/2015   Current Status: Since his last office visit, he is doing well with no complaints. He reports that he has been on a 'juice' diet recently. He has not been monitoring his blood glucose levels. He denies fatigue, frequent urination, blurred vision, excessive hunger, excessive thirst, weight gain, weight loss, and poor wound healing. He continues to check He feet regularly. He denies visual changes, chest pain, cough, shortness of breath, heart palpitations, and falls. He has occasional headaches and dizziness with position changes. Denies severe headaches, confusion, seizures, double vision, nausea and vomiting. He denies fevers, chills, fatigue, recent infections, weight loss, and night sweats. Denies GI problems such as diarrhea, and constipation. He has no reports of blood in stools, dysuria and hematuria. No depression or anxiety, and denies suicidal ideations, homicidal ideations, or auditory hallucinations. He is taking all medications as prescribed. He denies pain today.   Past Medical History:  Diagnosis Date  . Asthma   . Chronic systolic CHF (congestive heart failure) ([place])    a. 06/2015 Echo: EF 25-30%, mod-sev MR, PASP 68mHg. b. 08/2016: echo showing EF of 20-25% with diffuse HK  . Cocaine use   . Essential hypertension   . Hyperglycemia 09/2019  . Hyperlipidemia 09/2019  . Longstanding persistent atrial fibrillation ([place]    a. Initially dx 06/2015, paroxysmal at that time. b. Recurrent in Feb 2018 and beyond.  . Moderate mitral regurgitation    a. 06/2015 Echo: Mod-Sev MR. b. 08/2016: echo showing moderate MR.   .Marland KitchenNICM (nonischemic  cardiomyopathy) ([place])    a. 06/2015 Echo: EF 25-30%, normal cors by cath - ? Tachy-mediated;    . Non-obstructive CAD    a. 06/2015 Cath: LM nl, LAD 46m LCX nl, RCA nl, EF 25-30%.  . Tobacco use   . Vitamin D deficiency     Past Surgical History:  Procedure Laterality Date  . CARDIAC CATHETERIZATION N/A 07/26/2015     Procedure: Left Heart Cath and Coronary Angiography;  Surgeon: JJettie Booze MD;  [facility];  Service: Cardiovascular;  Laterality: N/A;  . CARDIOVERSION N/A 04/09/2017   Procedure: CARDIOVERSION;  Surgeon: RFay Records MD;  [facility] [facility]  Service: Cardiovascular;  Laterality: N/A;  . TEE WITHOUT CARDIOVERSION N/A 04/09/2017   Procedure: TRANSESOPHAGEAL ECHOCARDIOGRAM (TEE);  Surgeon: RFay Records MD;  [facility];  Service: Cardiovascular;  Laterality: N/A;    Family History  Problem Relation Age of Onset  . Hypertension Father     Social History   Socioeconomic History  . Marital status: Single    Spouse name: Not on file  . Number of children: Not on file  . Years of education: Not on file  . Highest education level: Not on file  Occupational History    Employer: [employer]  Tobacco Use  . Smoking status: Former Smoker    Packs/day: 0.50    Years: 40.00    Pack years: 20.00    Types: Cigarettes  . Smokeless tobacco: Never Used  . Tobacco comment: Quit in 2014  Vaping Use  . Vaping Use: Never used  Substance and Sexual Activity  . Alcohol use: Yes    Alcohol/week: 0.0 standard drinks    Comment: 2-3 drinks per week.  . Drug use: Yes    Comment: Not currently. Quit in 2014. 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  . Sexual activity: Not Currently  Other Topics Concern  . Not on file  Social History Narrative  . Not on file   Social Determinants of Health   Financial Resource Strain:   . Difficulty of Paying Living Expenses: Not on file  Food Insecurity:   . Worried About RCharity fundraiserin the Last Year: Not on file  . Ran Out of Food in the Last Year: Not on file  Transportation Needs:   . Lack of Transportation (Medical): Not on file  . Lack of Transportation (Non-Medical): Not on file  Physical Activity:   . Days of Exercise per Week: Not on file  . Minutes of Exercise per Session: Not on file  Stress:   .  Feeling of Stress : Not on file  Social Connections:   . Frequency of Communication with Friends and Family: Not on file  . Frequency of Social Gatherings with Friends and Family: Not on file  . Attends Religious Services: Not on file  . Active Member of Clubs or Organizations: Not on file  . Attends CArchivistMeetings: Not on file  . Marital Status: Not on file  Intimate Partner Violence:   . Fear of Current or Ex-Partner: Not on file  . Emotionally Abused: Not on file  . Physically Abused: Not on file  . Sexually Abused: Not on file    Outpatient Medications Prior to Visit  Medication Sig Dispense Refill  . cyclobenzaprine (FLEXERIL) 10 MG tablet TAKE 1 TABLET BY MOUTH THREE TIMES A DAY AS NEEDED FOR MUSCLE SPASMS 30 tablet 3  . fluticasone (FLONASE) 50 MCG/ACT nasal spray Place 2  sprays into both nostrils daily. 16 g 6  . apixaban (ELIQUIS) 5 MG TABS tablet Take 1 tablet (5 mg total) by mouth 2 (two) times daily. 60 tablet 12  . atorvastatin (LIPITOR) 10 MG tablet Take 1 tablet (10 mg total) by mouth daily. 30 tablet 6  . furosemide (LASIX) 40 MG tablet TAKE 1 TABLET BY MOUTH TWICE A DAY 60 tablet 8  . lansoprazole (PREVACID) 30 MG capsule Take 30 mg by mouth daily as needed (for heartburn/indigestion).    . montelukast (SINGULAIR) 10 MG tablet TAKE 1 TABLET BY MOUTH EVERYDAY AT BEDTIME 90 tablet 1  . sacubitril-valsartan (ENTRESTO) 49-51 MG Take 1 tablet by mouth 2 (two) times daily. 180 tablet 3  . cetirizine (ZYRTEC) 10 MG tablet TAKE 1 TABLET BY MOUTH EVERY DAY (Patient not taking: Reported on 04/19/2020) 30 tablet 1  . metoprolol succinate (TOPROL-XL) 50 MG 24 hr tablet Take 2 tablets (142m) in the morning, 1 tablet (596m in the evening with or after meal. (Patient not taking: Reported on 06/07/2019) 90 tablet 11  . spironolactone (ALDACTONE) 25 MG tablet TAKE 1/2 TO 1 (ONE-HALF TO ONE) TABLET BY MOUTH ONCE DAILY AS  DIRECTED. Please make appt for future refills.  [id number] attempt. (Patient not taking: Reported on 04/19/2020) 30 tablet 0  . Vitamin D, Ergocalciferol, (DRISDOL) 1.25 MG (50000 UNIT) CAPS capsule TAKE 1 CAPSULE (50,000 UNITS TOTAL) BY MOUTH EVERY 7 (SEVEN) DAYS. (Patient not taking: Reported on 04/19/2020) 5 capsule 3  . albuterol (PROVENTIL) (2.5 MG/3ML) 0.083% nebulizer solution Take 3 mLs (2.5 mg total) by nebulization every 6 (six) hours as needed for wheezing or shortness of breath. (Patient not taking: Reported on 04/19/2020) 75 mL 11  . albuterol (VENTOLIN HFA) 108 (90 Base) MCG/ACT inhaler Inhale 2 puffs into the lungs every 6 (six) hours as needed for wheezing or shortness of breath. (Patient not taking: Reported on 04/19/2020) 18 g 11   No facility-administered medications prior to visit.    No Known Allergies  ROS Review of Systems  Constitutional: Negative.   HENT: Negative.   Eyes: Negative.   Respiratory: Negative.   Cardiovascular: Negative.   Gastrointestinal: Negative.   Endocrine: Negative.   Genitourinary: Negative.   Musculoskeletal: Positive for arthralgias (generalized joint pain ).  Skin: Negative.   Allergic/Immunologic: Negative.   Neurological: Positive for dizziness (occasional ) and headaches (occasional ).  Hematological: Negative.   Psychiatric/Behavioral: Negative.       Objective:    Physical Exam Vitals and nursing note reviewed.  Constitutional:      Appearance: Normal appearance.  HENT:     Head: Normocephalic and atraumatic.     Nose: Nose normal.     Mouth/Throat:     Mouth: Mucous membranes are moist.     Pharynx: Oropharynx is clear.  Cardiovascular:     Rate and Rhythm: Normal rate and regular rhythm.     Pulses: Normal pulses.     Heart sounds: Normal heart sounds.  Pulmonary:     Effort: Pulmonary effort is normal.     Breath sounds: Normal breath sounds.  Abdominal:     General: Bowel sounds are normal.     Palpations: Abdomen is soft.  Musculoskeletal:         General: Normal range of motion.     Cervical back: Normal range of motion and neck supple.  Skin:    General: Skin is warm and dry.  Neurological:     General: No focal  deficit present.     Mental Status: He is alert and oriented to person, place, and time.  Psychiatric:        Mood and Affect: Mood normal.        Behavior: Behavior normal.        Thought Content: Thought content normal.        Judgment: Judgment normal.     BP (!) 141/73 (BP Location: Left Arm, Patient Position: Sitting, Cuff Size: Large)   Pulse 65   Temp 98.4 F (36.9 C)   Resp 16   Ht 6' 1"  (1.854 m)   Wt 254 lb 9.6 oz (115.5 kg)   SpO2 97%   BMI 33.59 kg/m  Wt Readings from Last 3 Encounters:  04/19/20 254 lb 9.6 oz (115.5 kg)  10/10/19 276 lb 12.8 oz (125.6 kg)  06/07/19 273 lb (123.8 kg)     Health Maintenance Due  Topic Date Due  . Hepatitis C Screening  Never done  . URINE MICROALBUMIN  Never done  . COVID-19 Vaccine (1) Never done  . TETANUS/TDAP  Never done  . COLONOSCOPY  Never done  . PNA vac Low Risk Adult (1 of 2 - PCV13) 02/12/2019  . INFLUENZA VACCINE  Never done    There are no preventive care reminders to display for this patient.  Lab Results  Component Value Date   TSH 2.000 10/10/2019   Lab Results  Component Value Date   WBC 7.3 10/10/2019   HGB 14.7 10/10/2019   HCT 44.8 10/10/2019   MCV 82 10/10/2019   PLT 190 10/10/2019   Lab Results  Component Value Date   NA 140 10/10/2019   K 4.1 10/10/2019   CO2 25 10/10/2019   GLUCOSE 154 (H) 10/10/2019   BUN 11 10/10/2019   CREATININE 1.11 10/10/2019   BILITOT 0.7 10/10/2019   ALKPHOS 133 (H) 10/10/2019   AST 13 10/10/2019   ALT 13 10/10/2019   PROT 7.2 10/10/2019   ALBUMIN 4.0 10/10/2019   CALCIUM 9.5 10/10/2019   ANIONGAP 7 04/10/2017   Lab Results  Component Value Date   CHOL 190 10/10/2019   Lab Results  Component Value Date   HDL 38 (L) 10/10/2019   Lab Results  Component Value Date   LDLCALC 123  (H) 10/10/2019   Lab Results  Component Value Date   TRIG 162 (H) 10/10/2019   Lab Results  Component Value Date   CHOLHDL 5.0 10/10/2019   Lab Results  Component Value Date   HGBA1C 14.2 (A) 04/19/2020   HGBA1C 14.2 04/19/2020   HGBA1C 14.2 (A) 04/19/2020   HGBA1C 14.2 (A) 04/19/2020   Assessment & Plan:   1. Type 2 diabetes mellitus with other specified complication, with long-term current use of insulin (HCC) He will continue medication as prescribed, to decrease foods/beverages high in sugars and carbs and follow Heart Healthy or DASH diet. Increase physical activity to at least 30 minutes cardio exercise daily.  - blood glucose meter kit and supplies; Dispense based on patient and insurance preference. Use up to four times daily as directed. (FOR ICD-10 E10.9, E11.9).  Dispense: 1 each; Refill: 0 - metFORMIN (GLUCOPHAGE) 1000 MG tablet; Take 1 tablet (1,000 mg total) by mouth 2 (two) times daily with a meal.  Dispense: 60 tablet; Refill: 3 - glipiZIDE (GLUCOTROL) 10 MG tablet; Take 1 tablet (10 mg total) by mouth 2 (two) times daily before a meal.  Dispense: 60 tablet; Refill: 3  2. Hemoglobin A1C  greater than 9%, indicating poor diabetic control Hgb A1c is increased at 14.2 today, from 7.6. Monitor. - blood glucose meter kit and supplies; Dispense based on patient and insurance preference. Use up to four times daily as directed. (FOR ICD-10 E10.9, E11.9).  Dispense: 1 each; Refill: 0 - metFORMIN (GLUCOPHAGE) 1000 MG tablet; Take 1 tablet (1,000 mg total) by mouth 2 (two) times daily with a meal.  Dispense: 60 tablet; Refill: 3 - glipiZIDE (GLUCOTROL) 10 MG tablet; Take 1 tablet (10 mg total) by mouth 2 (two) times daily before a meal.  Dispense: 60 tablet; Refill: 3  3. Hyperglycemia - blood glucose meter kit and supplies; Dispense based on patient and insurance preference. Use up to four times daily as directed. (FOR ICD-10 E10.9, E11.9).  Dispense: 1 each; Refill: 0 -  metFORMIN (GLUCOPHAGE) 1000 MG tablet; Take 1 tablet (1,000 mg total) by mouth 2 (two) times daily with a meal.  Dispense: 60 tablet; Refill: 3 - glipiZIDE (GLUCOTROL) 10 MG tablet; Take 1 tablet (10 mg total) by mouth 2 (two) times daily before a meal.  Dispense: 60 tablet; Refill: 3  4. Essential hypertension The current medical regimen is effective; blood pressure is stable at 141/73 today; continue present plan and medications as prescribed. She will continue to take medications as prescribed, to decrease high sodium intake, excessive alcohol intake, increase potassium intake, smoking cessation, and increase physical activity of at least 30 minutes of cardio activity daily. She will continue to follow Heart Healthy or DASH diet. - POC Glucose (CBG) - POC HgB A1c  5. Chronic obstructive pulmonary disease, unspecified COPD type ([place]) No signs or symptoms of respiratory distress noted today.  - albuterol (PROVENTIL) (2.5 MG/3ML) 0.083% nebulizer solution; Take 3 mLs (2.5 mg total) by nebulization every 6 (six) hours as needed for wheezing or shortness of breath.  Dispense: 75 mL; Refill: 11 - albuterol (VENTOLIN HFA) 108 (90 Base) MCG/ACT inhaler; Inhale 2 puffs into the lungs every 6 (six) hours as needed for wheezing or shortness of breath.  Dispense: 18 g; Refill: 11  6. Congestive heart failure, unspecified HF chronicity, unspecified heart failure type ([place])  7. Mixed hyperlipidemia - atorvastatin (LIPITOR) 10 MG tablet; Take 1 tablet (10 mg total) by mouth daily.  Dispense: 90 tablet; Refill: 3  8. Obese abdomen  9. Weight loss He has had a 20 lb weight loss in 10 months.  10. Mild asthma without complication, unspecified whether persistent - montelukast (SINGULAIR) 10 MG tablet; Take 1 tablet (10 mg total) by mouth at bedtime.  Dispense: 90 tablet; Refill: 3  11. Seasonal allergies - montelukast (SINGULAIR) 10 MG tablet; Take 1 tablet (10 mg total) by mouth at bedtime.  Dispense: 90  tablet; Refill: 3  12. Follow up He will follow up in 3 months.   Meds ordered this encounter  Medications  . blood glucose meter kit and supplies    Sig: Dispense based on patient and insurance preference. Use up to four times daily as directed. (FOR ICD-10 E10.9, E11.9).    Dispense:  1 each    Refill:  0    Order Specific Question:   Number of strips    Answer:   100    Order Specific Question:   Number of lancets    Answer:   100  . metFORMIN (GLUCOPHAGE) 1000 MG tablet    Sig: Take 1 tablet (1,000 mg total) by mouth 2 (two) times daily with a meal.    Dispense:  60 tablet    Refill:  3  . glipiZIDE (GLUCOTROL) 10 MG tablet    Sig: Take 1 tablet (10 mg total) by mouth 2 (two) times daily before a meal.    Dispense:  60 tablet    Refill:  3  . apixaban (ELIQUIS) 5 MG TABS tablet    Sig: Take 1 tablet (5 mg total) by mouth 2 (two) times daily.    Dispense:  180 tablet    Refill:  3  . atorvastatin (LIPITOR) 10 MG tablet    Sig: Take 1 tablet (10 mg total) by mouth daily.    Dispense:  90 tablet    Refill:  3  . furosemide (LASIX) 40 MG tablet    Sig: Take 1 tablet (40 mg total) by mouth 2 (two) times daily.    Dispense:  180 tablet    Refill:  3  . lansoprazole (PREVACID) 30 MG capsule    Sig: Take 1 capsule (30 mg total) by mouth daily as needed (for heartburn/indigestion).    Dispense:  90 capsule    Refill:  3  . montelukast (SINGULAIR) 10 MG tablet    Sig: Take 1 tablet (10 mg total) by mouth at bedtime.    Dispense:  90 tablet    Refill:  3  . sacubitril-valsartan (ENTRESTO) 49-51 MG    Sig: Take 1 tablet by mouth 2 (two) times daily.    Dispense:  180 tablet    Refill:  3  . albuterol (PROVENTIL) (2.5 MG/3ML) 0.083% nebulizer solution    Sig: Take 3 mLs (2.5 mg total) by nebulization every 6 (six) hours as needed for wheezing or shortness of breath.    Dispense:  75 mL    Refill:  11  . albuterol (VENTOLIN HFA) 108 (90 Base) MCG/ACT inhaler    Sig: Inhale 2  puffs into the lungs every 6 (six) hours as needed for wheezing or shortness of breath.    Dispense:  18 g    Refill:  11    Orders Placed This Encounter  Procedures  . POC Glucose (CBG)  . POC HgB A1c    Referral Orders  No referral(s) requested today   Kathe Becton,  MSN, FNP-BC [place], [place] [address] [phone] fax  Problem List Items Addressed This Visit      Cardiovascular and Mediastinum   CHF (congestive heart failure) (HCC)   Relevant Medications   apixaban (ELIQUIS) 5 MG TABS tablet   atorvastatin (LIPITOR) 10 MG tablet   furosemide (LASIX) 40 MG tablet   sacubitril-valsartan (ENTRESTO) 49-51 MG   Essential hypertension   Relevant Medications   apixaban (ELIQUIS) 5 MG TABS tablet   atorvastatin (LIPITOR) 10 MG tablet   furosemide (LASIX) 40 MG tablet   sacubitril-valsartan (ENTRESTO) 49-51 MG   Other Relevant Orders   POC Glucose (CBG) (Completed)   POC HgB A1c (Completed)     Respiratory   Asthma   Relevant Medications   montelukast (SINGULAIR) 10 MG tablet   albuterol (PROVENTIL) (2.5 MG/3ML) 0.083% nebulizer solution   albuterol (VENTOLIN HFA) 108 (90 Base) MCG/ACT inhaler   COPD (chronic obstructive pulmonary disease) (HCC)   Relevant Medications   montelukast (SINGULAIR) 10 MG tablet   albuterol (PROVENTIL) (2.5 MG/3ML) 0.083% nebulizer solution   albuterol (VENTOLIN HFA) 108 (90 Base) MCG/ACT inhaler     Other   Obese abdomen  Other Visit Diagnoses    Type 2 diabetes mellitus with other specified complication, with long-term current use of insulin ([place])    -  Primary   Relevant Medications   blood glucose meter kit and supplies   metFORMIN (GLUCOPHAGE) 1000 MG tablet   glipiZIDE (GLUCOTROL) 10 MG tablet   atorvastatin (LIPITOR) 10 MG tablet   Hemoglobin A1C greater than 9%, indicating poor diabetic control        Relevant Medications   blood glucose meter kit and supplies   metFORMIN (GLUCOPHAGE) 1000 MG tablet   glipiZIDE (GLUCOTROL) 10 MG tablet   atorvastatin (LIPITOR) 10 MG tablet   Hyperglycemia       Relevant Medications   blood glucose meter kit and supplies   metFORMIN (GLUCOPHAGE) 1000 MG tablet   glipiZIDE (GLUCOTROL) 10 MG tablet   Mixed hyperlipidemia       Relevant Medications   apixaban (ELIQUIS) 5 MG TABS tablet   atorvastatin (LIPITOR) 10 MG tablet   furosemide (LASIX) 40 MG tablet   sacubitril-valsartan (ENTRESTO) 49-51 MG   Weight loss       Seasonal allergies       Relevant Medications   montelukast (SINGULAIR) 10 MG tablet   Follow up          Meds ordered this encounter  Medications  . blood glucose meter kit and supplies    Sig: Dispense based on patient and insurance preference. Use up to four times daily as directed. (FOR ICD-10 E10.9, E11.9).    Dispense:  1 each    Refill:  0    Order Specific Question:   Number of strips    Answer:   100    Order Specific Question:   Number of lancets    Answer:   100  . metFORMIN (GLUCOPHAGE) 1000 MG tablet    Sig: Take 1 tablet (1,000 mg total) by mouth 2 (two) times daily with a meal.    Dispense:  60 tablet    Refill:  3  . glipiZIDE (GLUCOTROL) 10 MG tablet    Sig: Take 1 tablet (10 mg total) by mouth 2 (two) times daily before a meal.    Dispense:  60 tablet    Refill:  3  . apixaban (ELIQUIS) 5 MG TABS tablet    Sig: Take 1 tablet (5 mg total) by mouth 2 (two) times daily.    Dispense:  180 tablet    Refill:  3  . atorvastatin (LIPITOR) 10 MG tablet    Sig: Take 1 tablet (10 mg total) by mouth daily.    Dispense:  90 tablet    Refill:  3  . furosemide (LASIX) 40 MG tablet    Sig: Take 1 tablet (40 mg total) by mouth 2 (two) times daily.    Dispense:  180 tablet    Refill:  3  . lansoprazole (PREVACID) 30 MG capsule    Sig: Take 1 capsule (30 mg total) by mouth daily as needed (for  heartburn/indigestion).    Dispense:  90 capsule    Refill:  3  . montelukast (SINGULAIR) 10 MG tablet    Sig: Take 1 tablet (10 mg total) by mouth at bedtime.    Dispense:  90 tablet    Refill:  3  . sacubitril-valsartan (ENTRESTO) 49-51 MG    Sig: Take 1 tablet by mouth 2 (two) times daily.    Dispense:  180 tablet    Refill:  3  . albuterol (PROVENTIL) (  2.5 MG/3ML) 0.083% nebulizer solution    Sig: Take 3 mLs (2.5 mg total) by nebulization every 6 (six) hours as needed for wheezing or shortness of breath.    Dispense:  75 mL    Refill:  11  . albuterol (VENTOLIN HFA) 108 (90 Base) MCG/ACT inhaler    Sig: Inhale 2 puffs into the lungs every 6 (six) hours as needed for wheezing or shortness of breath.    Dispense:  18 g    Refill:  11    Follow-up: Return in about 3 months (around 07/19/2020).    Azzie Glatter, FNP

## 2020-04-19 NOTE — Patient Instructions (Signed)
Diabetes Mellitus and Nutrition, Adult When you have diabetes (diabetes mellitus), it is very important to have healthy eating habits because your blood sugar (glucose) levels are greatly affected by what you eat and drink. Eating healthy foods in the appropriate amounts, at about the same times every day, can help you:  Control your blood glucose.  Lower your risk of heart disease.  Improve your blood pressure.  Reach or maintain a healthy weight. Every person with diabetes is different, and each person has different needs for a meal plan. Your health care provider may recommend that you work with a diet and nutrition specialist (dietitian) to make a meal plan that is best for you. Your meal plan may vary depending on factors such as:  The calories you need.  The medicines you take.  Your weight.  Your blood glucose, blood pressure, and cholesterol levels.  Your activity level.  Other health conditions you have, such as heart or kidney disease. How do carbohydrates affect me? Carbohydrates, also called carbs, affect your blood glucose level more than any other type of food. Eating carbs naturally raises the amount of glucose in your blood. Carb counting is a method for keeping track of how many carbs you eat. Counting carbs is important to keep your blood glucose at a healthy level, especially if you use insulin or take certain oral diabetes medicines. It is important to know how many carbs you can safely have in each meal. This is different for every person. Your dietitian can help you calculate how many carbs you should have at each meal and for each snack. Foods that contain carbs include:  Bread, cereal, rice, pasta, and crackers.  Potatoes and corn.  Peas, beans, and lentils.  Milk and yogurt.  Fruit and juice.  Desserts, such as cakes, cookies, ice cream, and candy. How does alcohol affect me? Alcohol can cause a sudden decrease in blood glucose (hypoglycemia),  especially if you use insulin or take certain oral diabetes medicines. Hypoglycemia can be a life-threatening condition. Symptoms of hypoglycemia (sleepiness, dizziness, and confusion) are similar to symptoms of having too much alcohol. If your health care provider says that alcohol is safe for you, follow these guidelines:  Limit alcohol intake to no more than 1 drink per day for nonpregnant women and 2 drinks per day for men. One drink equals 12 oz of beer, 5 oz of wine, or 1 oz of hard liquor.  Do not drink on an empty stomach.  Keep yourself hydrated with water, diet soda, or unsweetened iced tea.  Keep in mind that regular soda, juice, and other mixers may contain a lot of sugar and must be counted as carbs. What are tips for following this plan?  Reading food labels  Start by checking the serving size on the "Nutrition Facts" label of packaged foods and drinks. The amount of calories, carbs, fats, and other nutrients listed on the label is based on one serving of the item. Many items contain more than one serving per package.  Check the total grams (g) of carbs in one serving. You can calculate the number of servings of carbs in one serving by dividing the total carbs by 15. For example, if a food has 30 g of total carbs, it would be equal to 2 servings of carbs.  Check the number of grams (g) of saturated and trans fats in one serving. Choose foods that have low or no amount of these fats.  Check the number of   milligrams (mg) of salt (sodium) in one serving. Most people should limit total sodium intake to less than 2,300 mg per day.  Always check the nutrition information of foods labeled as "low-fat" or "nonfat". These foods may be higher in added sugar or refined carbs and should be avoided.  Talk to your dietitian to identify your daily goals for nutrients listed on the label. Shopping  Avoid buying canned, premade, or processed foods. These foods tend to be high in fat, sodium,  and added sugar.  Shop around the outside edge of the grocery store. This includes fresh fruits and vegetables, bulk grains, fresh meats, and fresh dairy. Cooking  Use low-heat cooking methods, such as baking, instead of high-heat cooking methods like deep frying.  Cook using healthy oils, such as olive, canola, or sunflower oil.  Avoid cooking with butter, cream, or high-fat meats. Meal planning  Eat meals and snacks regularly, preferably at the same times every day. Avoid going long periods of time without eating.  Eat foods high in fiber, such as fresh fruits, vegetables, beans, and whole grains. Talk to your dietitian about how many servings of carbs you can eat at each meal.  Eat 4-6 ounces (oz) of lean protein each day, such as lean meat, chicken, fish, eggs, or tofu. One oz of lean protein is equal to: ? 1 oz of meat, chicken, or fish. ? 1 egg. ?  cup of tofu.  Eat some foods each day that contain healthy fats, such as avocado, nuts, seeds, and fish. Lifestyle  Check your blood glucose regularly.  Exercise regularly as told by your health care provider. This may include: ? 150 minutes of moderate-intensity or vigorous-intensity exercise each week. This could be brisk walking, biking, or water aerobics. ? Stretching and doing strength exercises, such as yoga or weightlifting, at least 2 times a week.  Take medicines as told by your health care provider.  Do not use any products that contain nicotine or tobacco, such as cigarettes and e-cigarettes. If you need help quitting, ask your health care provider.  Work with a Social worker or diabetes educator to identify strategies to manage stress and any emotional and social challenges. Questions to ask a health care provider  Do I need to meet with a diabetes educator?  Do I need to meet with a dietitian?  What number can I call if I have questions?  When are the best times to check my blood glucose? Where to find more  information:  American Diabetes Association: diabetes.org  Academy of Nutrition and Dietetics: www.eatright.[employer] of Diabetes and Digestive and Kidney Diseases (NIH): [employer] Summary  A healthy meal plan will help you control your blood glucose and maintain a healthy lifestyle.  Working with a diet and nutrition specialist (dietitian) can help you make a meal plan that is best for you.  Keep in mind that carbohydrates (carbs) and alcohol have immediate effects on your blood glucose levels. It is important to count carbs and to use alcohol carefully. This information is not intended to replace advice given to you by your health care provider. Make sure you discuss any questions you have with your health care provider. Document Revised: 06/25/2017 Document Reviewed: 08/17/2016 Elsevier Patient Education  2020 [place]. Diabetes Basics  Diabetes (diabetes mellitus) is a long-term (chronic) disease. It occurs when the body does not properly use sugar (glucose) that is released from food after you eat. Diabetes may be caused by one or both of  these problems:  Your pancreas does not make enough of a hormone called insulin.  Your body does not react in a normal way to insulin that it makes. Insulin lets sugars (glucose) go into cells in your body. This gives you energy. If you have diabetes, sugars cannot get into cells. This causes high blood sugar (hyperglycemia). Follow these instructions at home: How is diabetes treated? You may need to take insulin or other diabetes medicines daily to keep your blood sugar in balance. Take your diabetes medicines every day as told by your doctor. List your diabetes medicines here: Diabetes medicines  Name of medicine: ______________________________ ? Amount (dose): _______________ Time (a.m./p.m.): _______________ Notes: ___________________________________  Name of medicine: ______________________________ ? Amount  (dose): _______________ Time (a.m./p.m.): _______________ Notes: ___________________________________  Name of medicine: ______________________________ ? Amount (dose): _______________ Time (a.m./p.m.): _______________ Notes: ___________________________________ If you use insulin, you will learn how to give yourself insulin by injection. You may need to adjust the amount based on the food that you eat. List the types of insulin you use here: Insulin  Insulin type: ______________________________ ? Amount (dose): _______________ Time (a.m./p.m.): _______________ Notes: ___________________________________  Insulin type: ______________________________ ? Amount (dose): _______________ Time (a.m./p.m.): _______________ Notes: ___________________________________  Insulin type: ______________________________ ? Amount (dose): _______________ Time (a.m./p.m.): _______________ Notes: ___________________________________  Insulin type: ______________________________ ? Amount (dose): _______________ Time (a.m./p.m.): _______________ Notes: ___________________________________  Insulin type: ______________________________ ? Amount (dose): _______________ Time (a.m./p.m.): _______________ Notes: ___________________________________ How do I manage my blood sugar?  Check your blood sugar levels using a blood glucose monitor as directed by your doctor. Your doctor will set treatment goals for you. Generally, you should have these blood sugar levels:  Before meals (preprandial): 80-130 mg/dL (4.4-7.2 mmol/L).  After meals (postprandial): below 180 mg/dL (10 mmol/L).  A1c level: less than 7%. Write down the times that you will check your blood sugar levels: Blood sugar checks  Time: _______________ Notes: ___________________________________  Time: _______________ Notes: ___________________________________  Time: _______________ Notes: ___________________________________  Time: _______________ Notes:  ___________________________________  Time: _______________ Notes: ___________________________________  Time: _______________ Notes: ___________________________________  What do I need to know about low blood sugar? Low blood sugar is called hypoglycemia. This is when blood sugar is at or below 70 mg/dL (3.9 mmol/L). Symptoms may include:  Feeling: ? Hungry. ? Worried or nervous (anxious). ? Sweaty and clammy. ? Confused. ? Dizzy. ? Sleepy. ? Sick to your stomach (nauseous).  Having: ? A fast heartbeat. ? A headache. ? A change in your vision. ? Tingling or no feeling (numbness) around the mouth, lips, or tongue. ? Jerky movements that you cannot control (seizure).  Having trouble with: ? Moving (coordination). ? Sleeping. ? Passing out (fainting). ? Getting upset easily (irritability). Treating low blood sugar To treat low blood sugar, eat or drink something sugary right away. If you can think clearly and swallow safely, follow the 15:15 rule:  Take 15 grams of a fast-acting carb (carbohydrate). Talk with your doctor about how much you should take.  Some fast-acting carbs are: ? Sugar tablets (glucose pills). Take 3-4 glucose pills. ? 6-8 pieces of hard candy. ? 4-6 oz (120-150 mL) of fruit juice. ? 4-6 oz (120-150 mL) of regular (not diet) soda. ? 1 Tbsp (15 mL) honey or sugar.  Check your blood sugar 15 minutes after you take the carb.  If your blood sugar is still at or below 70 mg/dL (3.9 mmol/L), take 15 grams of a carb again.  If your blood sugar does not  go above 70 mg/dL (3.9 mmol/L) after 3 tries, get help right away.  After your blood sugar goes back to normal, eat a meal or a snack within 1 hour. Treating very low blood sugar If your blood sugar is at or below 54 mg/dL (3 mmol/L), you have very low blood sugar (severe hypoglycemia). This is an emergency. Do not wait to see if the symptoms will go away. Get medical help right away. Call your local emergency  services (911 in the [place] Do not drive yourself to the hospital. Questions to ask your health care provider  Do I need to meet with a diabetes educator?  What equipment will I need to care for myself at home?  What diabetes medicines do I need? When should I take them?  How often do I need to check my blood sugar?  What number can I call if I have questions?  When is my next doctor's visit?  Where can I find a support group for people with diabetes? Where to find more information  American Diabetes Association: www.diabetes.org  American Association of Diabetes Educators: www.diabeteseducator.org/patient-resources Contact a doctor if:  Your blood sugar is at or above 240 mg/dL (13.3 mmol/L) for 2 days in a row.  You have been sick or have had a fever for 2 days or more, and you are not getting better.  You have any of these problems for more than 6 hours: ? You cannot eat or drink. ? You feel sick to your stomach (nauseous). ? You throw up (vomit). ? You have watery poop (diarrhea). Get help right away if:  Your blood sugar is lower than 54 mg/dL (3 mmol/L).  You get confused.  You have trouble: ? Thinking clearly. ? Breathing. Summary  Diabetes (diabetes mellitus) is a long-term (chronic) disease. It occurs when the body does not properly use sugar (glucose) that is released from food after digestion.  Take insulin and diabetes medicines as told.  Check your blood sugar every day, as often as told.  Keep all follow-up visits as told by your doctor. This is important. This information is not intended to replace advice given to you by your health care provider. Make sure you discuss any questions you have with your health care provider. Document Revised: 04/05/2019 Document Reviewed: 10/15/2017 Elsevier Patient Education  [place]. Metformin tablets What is this medicine? METFORMIN (met FOR min) is used to treat type 2 diabetes. It helps to control  blood sugar. Treatment is combined with diet and exercise. This medicine can be used alone or with other medicines for diabetes. This medicine may be used for other purposes; ask your health care provider or pharmacist if you have questions. COMMON BRAND NAME(S): Glucophage What should I tell my health care provider before I take this medicine? They need to know if you have any of these conditions:  anemia  dehydration  heart disease  frequently drink alcohol-containing beverages  kidney disease  liver disease  polycystic ovary syndrome  serious infection or injury  vomiting  an unusual or allergic reaction to metformin, other medicines, foods, dyes, or preservatives  pregnant or trying to get pregnant  breast-feeding How should I use this medicine? Take this medicine by mouth with a glass of water. Follow the directions on the prescription label. Take this medicine with food. Take your medicine at regular intervals. Do not take your medicine more often than directed. Do not stop taking except on your doctor's advice. Talk to your pediatrician  regarding the use of this medicine in children. While this drug may be prescribed for children as young as 51 years of age for selected conditions, precautions do apply. Overdosage: If you think you have taken too much of this medicine contact a poison control center or emergency room at once. NOTE: This medicine is only for you. Do not share this medicine with others. What if I miss a dose? If you miss a dose, take it as soon as you can. If it is almost time for your next dose, take only that dose. Do not take double or extra doses. What may interact with this medicine? Do not take this medicine with any of the following medications:  certain contrast medicines given before X-rays, CT scans, MRI, or other procedures  dofetilide This medicine may also interact with the following medications:  acetazolamide  alcohol  certain  antivirals for HIV or hepatitis  certain medicines for blood pressure, heart disease, irregular heart beat  cimetidine  dichlorphenamide  digoxin  diuretics  male hormones, like estrogens or progestins and birth control pills  glycopyrrolate  isoniazid  lamotrigine  memantine  methazolamide  metoclopramide  midodrine  niacin  phenothiazines like chlorpromazine, mesoridazine, prochlorperazine, thioridazine  phenytoin  ranolazine  steroid medicines like prednisone or cortisone  stimulant medicines for attention disorders, weight loss, or to stay awake  thyroid medicines  topiramate  trospium  vandetanib  zonisamide This list may not describe all possible interactions. Give your health care provider a list of all the medicines, herbs, non-prescription drugs, or dietary supplements you use. Also tell them if you smoke, drink alcohol, or use illegal drugs. Some items may interact with your medicine. What should I watch for while using this medicine? Visit your doctor or health care professional for regular checks on your progress. A test called the HbA1C (A1C) will be monitored. This is a simple blood test. It measures your blood sugar control over the last 2 to 3 months. You will receive this test every 3 to 6 months. Learn how to check your blood sugar. Learn the symptoms of low and high blood sugar and how to manage them. Always carry a quick-source of sugar with you in case you have symptoms of low blood sugar. Examples include hard sugar candy or glucose tablets. Make sure others know that you can choke if you eat or drink when you develop serious symptoms of low blood sugar, such as seizures or unconsciousness. They must get medical help at once. Tell your doctor or health care professional if you have high blood sugar. You might need to change the dose of your medicine. If you are sick or exercising more than usual, you might need to change the dose of your  medicine. Do not skip meals. Ask your doctor or health care professional if you should avoid alcohol. Many nonprescription cough and cold products contain sugar or alcohol. These can affect blood sugar. This medicine may cause ovulation in premenopausal women who do not have regular monthly periods. This may increase your chances of becoming pregnant. You should not take this medicine if you become pregnant or think you may be pregnant. Talk with your doctor or health care professional about your birth control options while taking this medicine. Contact your doctor or health care professional right away if you think you are pregnant. If you are going to need surgery, a MRI, CT scan, or other procedure, tell your doctor that you are taking this medicine. You may need  to stop taking this medicine before the procedure. Wear a medical ID bracelet or chain, and carry a card that describes your disease and details of your medicine and dosage times. This medicine may cause a decrease in folic acid and vitamin B12. You should make sure that you get enough vitamins while you are taking this medicine. Discuss the foods you eat and the vitamins you take with your health care professional. What side effects may I notice from receiving this medicine? Side effects that you should report to your doctor or health care professional as soon as possible:  allergic reactions like skin rash, itching or hives, swelling of the face, lips, or tongue  breathing problems  feeling faint or lightheaded, falls  muscle aches or pains  signs and symptoms of low blood sugar such as feeling anxious, confusion, dizziness, increased hunger, unusually weak or tired, sweating, shakiness, cold, irritable, headache, blurred vision, fast heartbeat, loss of consciousness  slow or irregular heartbeat  unusual stomach pain or discomfort  unusually tired or weak Side effects that usually do not require medical attention (report to your  doctor or health care professional if they continue or are bothersome):  diarrhea  headache  heartburn  metallic taste in mouth  nausea  stomach gas, upset This list may not describe all possible side effects. Call your doctor for medical advice about side effects. You may report side effects to FDA at 1-800-FDA-1088. Where should I keep my medicine? Keep out of the reach of children. Store at room temperature between 15 and 30 degrees C (59 and 86 degrees F). Protect from moisture and light. Throw away any unused medicine after the expiration date. NOTE: This sheet is a summary. It may not cover all possible information. If you have questions about this medicine, talk to your doctor, pharmacist, or health care provider.  2020 Elsevier/Gold Standard (2017-08-19 19:15:19) Glipizide tablets What is this medicine? GLIPIZIDE (GLIP i zide) helps to treat type 2 diabetes. Treatment is combined with diet and exercise. The medicine helps your body to use insulin better. This medicine may be used for other purposes; ask your health care provider or pharmacist if you have questions. COMMON BRAND NAME(S): Glucotrol What should I tell my health care provider before I take this medicine? They need to know if you have any of these conditions:  diabetic ketoacidosis  glucose-6-phosphate dehydrogenase deficiency  heart disease  kidney disease  liver disease  porphyria  severe infection or injury  thyroid disease  an unusual or allergic reaction to glipizide, sulfa drugs, other medicines, foods, dyes, or preservatives  pregnant or trying to get pregnant  breast-feeding How should I use this medicine? Take this medicine by mouth. Swallow with a drink of water. Do not take with food. Take it 30 minutes before a meal. Follow the directions on the prescription label. If you take this medicine once a day, take it 30 minutes before breakfast. Take your doses at the same time each day. Do  not take more often than directed. Talk to your pediatrician regarding the use of this medicine in children. Special care may be needed. Elderly patients over 8 years old may have a stronger reaction and need a smaller dose. Overdosage: If you think you have taken too much of this medicine contact a poison control center or emergency room at once. NOTE: This medicine is only for you. Do not share this medicine with others. What if I miss a dose? If you miss a dose,  take it as soon as you can. If it is almost time for your next dose, take only that dose. Do not take double or extra doses. What may interact with this medicine?  bosentan  chloramphenicol  cisapride  clarithromycin  medicines for fungal or yeast infections  metoclopramide  probenecid  warfarin Many medications may cause an increase or decrease in blood sugar, these include:  alcohol containing beverages  aspirin and aspirin-like drugs  chloramphenicol  chromium  diuretics  male hormones, like estrogens or progestins and birth control pills  heart medicines  isoniazid  male hormones or anabolic steroids  medicines for weight loss  medicines for allergies, asthma, cold, or cough  medicines for mental problems  medicines called MAO Inhibitors like Nardil, Parnate, Marplan, Eldepryl  niacin  NSAIDs, medicines for pain and inflammation, like ibuprofen or naproxen  pentamidine  phenytoin  probenecid  quinolone antibiotics like ciprofloxacin, levofloxacin, ofloxacin  some herbal dietary supplements  steroid medicines like prednisone or cortisone  thyroid medicine This list may not describe all possible interactions. Give your health care provider a list of all the medicines, herbs, non-prescription drugs, or dietary supplements you use. Also tell them if you smoke, drink alcohol, or use illegal drugs. Some items may interact with your medicine. What should I watch for while using this  medicine? Visit your doctor or health care professional for regular checks on your progress. A test called the HbA1C (A1C) will be monitored. This is a simple blood test. It measures your blood sugar control over the last 2 to 3 months. You will receive this test every 3 to 6 months. Learn how to check your blood sugar. Learn the symptoms of low and high blood sugar and how to manage them. Always carry a quick-source of sugar with you in case you have symptoms of low blood sugar. Examples include hard sugar candy or glucose tablets. Make sure others know that you can choke if you eat or drink when you develop serious symptoms of low blood sugar, such as seizures or unconsciousness. They must get medical help at once. Tell your doctor or health care professional if you have high blood sugar. You might need to change the dose of your medicine. If you are sick or exercising more than usual, you might need to change the dose of your medicine. Do not skip meals. Ask your doctor or health care professional if you should avoid alcohol. Many nonprescription cough and cold products contain sugar or alcohol. These can affect blood sugar. This medicine can make you more sensitive to the sun. Keep out of the sun. If you cannot avoid being in the sun, wear protective clothing and use sunscreen. Do not use sun lamps or tanning beds/booths. Wear a medical ID bracelet or chain, and carry a card that describes your disease and details of your medicine and dosage times. What side effects may I notice from receiving this medicine? Side effects that you should report to your doctor or health care professional as soon as possible:  allergic reactions like skin rash, itching or hives, swelling of the face, lips, or tongue  breathing problems  dark urine  fever, chills, sore throat  signs and symptoms of low blood sugar such as feeling anxious, confusion, dizziness, increased hunger, unusually weak or tired, sweating,  shakiness, cold, irritable, headache, blurred vision, fast heartbeat, loss of consciousness  unusual bleeding or bruising  yellowing of the eyes or skin Side effects that usually do not require medical  attention (report to your doctor or health care professional if they continue or are bothersome):  diarrhea  dizziness  headache  heartburn  nausea  stomach gas This list may not describe all possible side effects. Call your doctor for medical advice about side effects. You may report side effects to FDA at 1-800-FDA-1088. Where should I keep my medicine? Keep out of the reach of children. Store at room temperature below 30 degrees C (86 degrees F). Throw away any unused medicine after the expiration date. NOTE: This sheet is a summary. It may not cover all possible information. If you have questions about this medicine, talk to your doctor, pharmacist, or health care provider.  2020 Elsevier/Gold Standard (2012-10-26 14:42:46)

## 2020-04-23 ENCOUNTER — Encounter: Payer: Self-pay | Admitting: Family Medicine

## 2020-04-28 ENCOUNTER — Other Ambulatory Visit: Payer: Self-pay | Admitting: Family Medicine

## 2020-04-28 DIAGNOSIS — T7840XA Allergy, unspecified, initial encounter: Secondary | ICD-10-CM

## 2020-05-15 ENCOUNTER — Other Ambulatory Visit: Payer: Self-pay | Admitting: Family Medicine

## 2020-05-20 ENCOUNTER — Other Ambulatory Visit: Payer: Self-pay | Admitting: Family Medicine

## 2020-05-20 DIAGNOSIS — T7840XA Allergy, unspecified, initial encounter: Secondary | ICD-10-CM

## 2020-05-20 MED ORDER — FLUTICASONE PROPIONATE 50 MCG/ACT NA SUSP: NASAL | 2 refills | Status: DC

## 2020-06-25 ENCOUNTER — Ambulatory Visit: Payer: Medicare Other | Admitting: Internal Medicine

## 2020-07-17 ENCOUNTER — Ambulatory Visit: Payer: Medicare Other | Admitting: Family Medicine

## 2020-08-08 ENCOUNTER — Other Ambulatory Visit: Payer: Self-pay | Admitting: Family Medicine

## 2020-08-08 DIAGNOSIS — J45909 Unspecified asthma, uncomplicated: Secondary | ICD-10-CM

## 2020-09-02 ENCOUNTER — Other Ambulatory Visit: Payer: Self-pay | Admitting: Family Medicine

## 2020-09-02 DIAGNOSIS — R7309 Other abnormal glucose: Secondary | ICD-10-CM

## 2020-09-02 DIAGNOSIS — E1169 Type 2 diabetes mellitus with other specified complication: Secondary | ICD-10-CM

## 2020-09-02 DIAGNOSIS — R739 Hyperglycemia, unspecified: Secondary | ICD-10-CM

## 2020-09-03 ENCOUNTER — Other Ambulatory Visit: Payer: Self-pay | Admitting: Family Medicine

## 2020-09-07 ENCOUNTER — Other Ambulatory Visit: Payer: Self-pay | Admitting: Family Medicine

## 2020-09-07 DIAGNOSIS — J45909 Unspecified asthma, uncomplicated: Secondary | ICD-10-CM

## 2020-09-23 ENCOUNTER — Other Ambulatory Visit: Payer: Self-pay | Admitting: Family Medicine

## 2020-09-23 DIAGNOSIS — J45909 Unspecified asthma, uncomplicated: Secondary | ICD-10-CM

## 2020-09-25 ENCOUNTER — Encounter: Payer: Self-pay | Admitting: Family Medicine

## 2020-09-25 ENCOUNTER — Ambulatory Visit (INDEPENDENT_AMBULATORY_CARE_PROVIDER_SITE_OTHER): Payer: Medicare Other | Admitting: Family Medicine

## 2020-09-25 ENCOUNTER — Other Ambulatory Visit: Payer: Self-pay

## 2020-09-25 VITALS — BP 106/64 | HR 78 | Temp 97.7°F

## 2020-09-25 DIAGNOSIS — Z0000 Encounter for general adult medical examination without abnormal findings: Secondary | ICD-10-CM | POA: Diagnosis not present

## 2020-09-25 DIAGNOSIS — R7309 Other abnormal glucose: Secondary | ICD-10-CM | POA: Diagnosis not present

## 2020-09-25 DIAGNOSIS — I10 Essential (primary) hypertension: Secondary | ICD-10-CM | POA: Diagnosis not present

## 2020-09-25 DIAGNOSIS — J45909 Unspecified asthma, uncomplicated: Secondary | ICD-10-CM

## 2020-09-25 DIAGNOSIS — R809 Proteinuria, unspecified: Secondary | ICD-10-CM

## 2020-09-25 DIAGNOSIS — J302 Other seasonal allergic rhinitis: Secondary | ICD-10-CM | POA: Diagnosis not present

## 2020-09-25 DIAGNOSIS — E162 Hypoglycemia, unspecified: Secondary | ICD-10-CM | POA: Diagnosis not present

## 2020-09-25 DIAGNOSIS — Z91148 Patient's other noncompliance with medication regimen for other reason: Secondary | ICD-10-CM

## 2020-09-25 DIAGNOSIS — R310 Gross hematuria: Secondary | ICD-10-CM

## 2020-09-25 DIAGNOSIS — Z794 Long term (current) use of insulin: Secondary | ICD-10-CM

## 2020-09-25 DIAGNOSIS — Z87448 Personal history of other diseases of urinary system: Secondary | ICD-10-CM | POA: Diagnosis not present

## 2020-09-25 DIAGNOSIS — E1169 Type 2 diabetes mellitus with other specified complication: Secondary | ICD-10-CM

## 2020-09-25 DIAGNOSIS — R739 Hyperglycemia, unspecified: Secondary | ICD-10-CM | POA: Diagnosis not present

## 2020-09-25 DIAGNOSIS — Z09 Encounter for follow-up examination after completed treatment for conditions other than malignant neoplasm: Secondary | ICD-10-CM

## 2020-09-25 DIAGNOSIS — Z9114 Patient's other noncompliance with medication regimen: Secondary | ICD-10-CM

## 2020-09-25 LAB — POCT URINALYSIS DIPSTICK
Bilirubin, UA: NEGATIVE
Glucose, UA: NEGATIVE
Ketones, UA: NEGATIVE
Nitrite, UA: NEGATIVE
Protein, UA: POSITIVE — AB
Spec Grav, UA: 1.02 (ref 1.010–1.025)
Urobilinogen, UA: 1 E.U./dL
pH, UA: 5 (ref 5.0–8.0)

## 2020-09-25 LAB — GLUCOSE, POCT (MANUAL RESULT ENTRY): POC Glucose: 55 mg/dl — AB (ref 70–99)

## 2020-09-25 MED ORDER — SITAGLIPTIN PHOSPHATE 25 MG PO TABS: 25.0000 mg | ORAL_TABLET | Freq: Every day | ORAL | 11 refills | Status: DC

## 2020-09-25 MED ORDER — GLIPIZIDE 10 MG PO TABS: 10.0000 mg | ORAL_TABLET | Freq: Two times a day (BID) | ORAL | 3 refills | Status: DC

## 2020-09-25 MED ORDER — METFORMIN HCL 1000 MG PO TABS
1000.0000 mg | ORAL_TABLET | Freq: Two times a day (BID) | ORAL | 3 refills | Status: DC
Start: 2020-09-25 — End: 2021-09-04

## 2020-09-25 MED ORDER — DOXYCYCLINE HYCLATE 100 MG PO TABS
100.0000 mg | ORAL_TABLET | Freq: Two times a day (BID) | ORAL | 0 refills | Status: DC
Start: 2020-09-25 — End: 2020-12-21

## 2020-09-25 MED ORDER — LANSOPRAZOLE 30 MG PO CPDR: 30.0000 mg | DELAYED_RELEASE_CAPSULE | Freq: Every day | ORAL | 3 refills | Status: DC | PRN

## 2020-09-25 MED ORDER — MONTELUKAST SODIUM 10 MG PO TABS: 10.0000 mg | ORAL_TABLET | Freq: Every day | ORAL | 3 refills | Status: DC

## 2020-09-25 NOTE — Progress Notes (Signed)
Patient [facility]    Established Patient Office Visit  Subjective:  Patient ID: Kevin [name], male    DOB: 13-Nov-1955  Age: 65 y.o. MRN: [id number]  CC:  Chief Complaint  Patient presents with  . Hematuria    Notice after started , medication hydrocute, no pain , chills, no fever,   no pain with urination     HPI Stalin Gruenberg is a 65 year old male who presents for Follow Up today.   Patient Active Problem List   Diagnosis Date Noted  . Obese abdomen 06/08/2019  . Class 2 severe obesity due to excess calories with serious comorbidity and body mass index (BMI) of 36.0 to 36.9 in adult [facility]) 06/08/2019  . Muscle spasm 03/31/2019  . Sinus congestion 12/21/2018  . Tobacco use   . Non-obstructive CAD   . NICM (nonischemic cardiomyopathy) ([place])   . Moderate mitral regurgitation   . Cocaine use   . Asthma   . Persistent atrial fibrillation ([place])   . DCM (dilated cardiomyopathy) ([place])   . CHF (congestive heart failure) ([place]) 04/04/2017  . Current use of long term anticoagulation 02/24/2017  . Hypotension 02/24/2017  . CKD (chronic kidney disease), stage II 02/08/2017  . History of cocaine use 02/08/2017  . Former tobacco use 02/08/2017  . Atrial fibrillation with rapid ventricular response ([place])   . Longstanding persistent atrial fibrillation ([place]) 10/19/2016  . AKI (acute kidney injury) ([place]) 10/19/2016  . Mitral regurgitation 09/28/2016  . Essential hypertension 09/28/2016  . Acute on chronic systolic heart failure ([place]) 09/15/2016  . Non-ischemic cardiomyopathy ([place])   . Coronary artery disease involving native coronary artery of native heart without angina pectoris   . Chronic systolic heart failure ([place]) 07/26/2015  . Paroxysmal atrial fibrillation ([place]) 07/23/2015  . COPD (chronic obstructive pulmonary disease) ([place]) 07/23/2015  . Elevated troponin 07/23/2015  . Elevated brain natriuretic peptide (BNP) level 07/23/2015    Current Status: Since his last office visit, he has c/o of blood in his urine lately. He states that he tried taking OTC Hydrox-cut for weight loss. He has recently discontinued medication. His Hgb A1c is unreadable today. He admits that he has been not been taking his medications as prescribed. He has not been monitoring his blood glucose levels regularly. He denies fatigue, frequent urination, blurred vision, excessive hunger, excessive thirst, weight gain, weight loss, and poor wound healing. He continues to check his feet regularly. He denies visual changes, chest pain, cough, shortness of breath, heart palpitations, and falls. He has occasional headaches and dizziness with position changes. Denies severe headaches, confusion, seizures, double vision, and blurred vision, nausea and vomiting. He denies fevers, chills, recent infections, weight loss, and night sweats. Denies GI problems such as diarrhea, and constipation. He has no reports of blood in stools, dysuria and hematuria. No depression or anxiety reported today. He is taking all medications as prescribed. He denies pain today.   Past Medical History:  Diagnosis Date  . Asthma   . Chronic systolic CHF (congestive heart failure) ([place])    a. 06/2015 Echo: EF 25-30%, mod-sev MR, PASP 74mHg. b. 08/2016: echo showing EF of 20-25% with diffuse HK  . Cocaine use   . Essential hypertension   . Hyperglycemia 09/2019  . Hyperlipidemia 09/2019  . Longstanding persistent atrial fibrillation ([place]    a. Initially dx 06/2015, paroxysmal at that time. b. Recurrent in Feb 2018 and beyond.  . Moderate mitral regurgitation  a. 06/2015 Echo: Mod-Sev MR. b. 08/2016: echo showing moderate MR.   Marland Kitchen NICM (nonischemic cardiomyopathy) ([place])    a. 06/2015 Echo: EF 25-30%, normal cors by cath - ? Tachy-mediated;    . Non-obstructive CAD    a. 06/2015 Cath: LM nl, LAD 21m LCX nl, RCA nl, EF 25-30%.  . Tobacco use   . Vitamin D deficiency     Past  Surgical History:  Procedure Laterality Date  . CARDIAC CATHETERIZATION N/A 07/26/2015   Procedure: Left Heart Cath and Coronary Angiography;  Surgeon: JJettie Booze MD;  [facility];  Service: Cardiovascular;  Laterality: N/A;  . CARDIOVERSION N/A 04/09/2017   Procedure: CARDIOVERSION;  Surgeon: RFay Records MD;  [facility]  Service: Cardiovascular;  Laterality: N/A;  . TEE WITHOUT CARDIOVERSION N/A 04/09/2017   Procedure: TRANSESOPHAGEAL ECHOCARDIOGRAM (TEE);  Surgeon: RFay Records MD;  [facility];  Service: Cardiovascular;  Laterality: N/A;    Family History  Problem Relation Age of Onset  . Hypertension Father     Social History   Socioeconomic History  . Marital status: Single    Spouse name: Not on file  . Number of children: Not on file  . Years of education: Not on file  . Highest education level: Not on file  Occupational History    Employer: [employer]  Tobacco Use  . Smoking status: Former Smoker    Packs/day: 0.50    Years: 40.00    Pack years: 20.00    Types: Cigarettes  . Smokeless tobacco: Never Used  . Tobacco comment: Quit in 2014  Vaping Use  . Vaping Use: Never used  Substance and Sexual Activity  . Alcohol use: Yes    Alcohol/week: 0.0 standard drinks    Comment: 2-3 drinks per week.  . Drug use: Yes    Comment: Not currently. Quit in 2014. 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  . Sexual activity: Not Currently  Other Topics Concern  . Not on file  Social History Narrative  . Not on file   Social Determinants of Health   Financial Resource Strain: Not on file  Food Insecurity: Not on file  Transportation Needs: Not on file  Physical Activity: Not on file  Stress: Not on file  Social Connections: Not on file  Intimate Partner Violence: Not on file    Outpatient Medications Prior to Visit  Medication Sig Dispense Refill  . ACCU-CHEK GUIDE test strip TEST UP TO 4 TIMES A DAY AS DIRECTED 100 strip 3   . albuterol (PROVENTIL) (2.5 MG/3ML) 0.083% nebulizer solution Take 3 mLs (2.5 mg total) by nebulization every 6 (six) hours as needed for wheezing or shortness of breath. 75 mL 11  . albuterol (VENTOLIN HFA) 108 (90 Base) MCG/ACT inhaler Inhale 2 puffs into the lungs every 6 (six) hours as needed for wheezing or shortness of breath. 18 g 11  . apixaban (ELIQUIS) 5 MG TABS tablet Take 1 tablet (5 mg total) by mouth 2 (two) times daily. 180 tablet 3  . atorvastatin (LIPITOR) 10 MG tablet Take 1 tablet (10 mg total) by mouth daily. 90 tablet 3  . blood glucose meter kit and supplies Dispense based on patient and insurance preference. Use up to four times daily as directed. (FOR ICD-10 E10.9, E11.9). 1 each 0  . cetirizine (ZYRTEC) 10 MG tablet TAKE 1 TABLET BY MOUTH EVERY DAY 90 tablet 1  . cyclobenzaprine (FLEXERIL) 10 MG tablet TAKE 1 TABLET BY MOUTH  THREE TIMES A DAY AS NEEDED FOR MUSCLE SPASMS 30 tablet 3  . fluticasone (FLONASE) 50 MCG/ACT nasal spray SPRAY 2 SPRAYS INTO EACH NOSTRIL EVERY DAY 48 mL 2  . furosemide (LASIX) 40 MG tablet Take 1 tablet (40 mg total) by mouth 2 (two) times daily. 180 tablet 3  . sacubitril-valsartan (ENTRESTO) 49-51 MG Take 1 tablet by mouth 2 (two) times daily. 180 tablet 3  . Vitamin D, Ergocalciferol, (DRISDOL) 1.25 MG (50000 UNIT) CAPS capsule TAKE 1 CAPSULE (50,000 UNITS TOTAL) BY MOUTH EVERY 7 (SEVEN) DAYS. 5 capsule 3  . glipiZIDE (GLUCOTROL) 10 MG tablet Take 1 tablet (10 mg total) by mouth 2 (two) times daily before a meal. 60 tablet 3  . lansoprazole (PREVACID) 30 MG capsule Take 1 capsule (30 mg total) by mouth daily as needed (for heartburn/indigestion). 90 capsule 3  . metFORMIN (GLUCOPHAGE) 1000 MG tablet Take 1 tablet (1,000 mg total) by mouth 2 (two) times daily with a meal. 60 tablet 3  . montelukast (SINGULAIR) 10 MG tablet Take 1 tablet (10 mg total) by mouth at bedtime. 90 tablet 3  . metoprolol succinate (TOPROL-XL) 50 MG 24 hr tablet Take 2  tablets (128m) in the morning, 1 tablet (578m in the evening with or after meal. (Patient not taking: Reported on 09/25/2020) 90 tablet 11  . spironolactone (ALDACTONE) 25 MG tablet TAKE 1/2 TO 1 (ONE-HALF TO ONE) TABLET BY MOUTH ONCE DAILY AS  DIRECTED. Please make appt for future refills. [id number] attempt. (Patient not taking: Reported on 09/25/2020) 30 tablet 0   No facility-administered medications prior to visit.    No Known Allergies  ROS Review of Systems  Constitutional: Negative.   HENT: Negative.   Eyes: Negative.   Respiratory: Negative.   Cardiovascular: Negative.   Gastrointestinal: Negative.   Endocrine: Negative.   Genitourinary: Positive for hematuria.  Musculoskeletal: Negative.   Skin: Negative.   Allergic/Immunologic: Negative.   Neurological: Positive for dizziness (occasional ) and headaches (occasional ).  Hematological: Negative.   Psychiatric/Behavioral: Negative.     Objective:    Physical Exam Vitals and nursing note reviewed.  Constitutional:      Appearance: Normal appearance.  HENT:     Head: Normocephalic and atraumatic.     Nose: Nose normal.     Mouth/Throat:     Mouth: Mucous membranes are moist.     Pharynx: Oropharynx is clear.  Cardiovascular:     Rate and Rhythm: Normal rate and regular rhythm.     Pulses: Normal pulses.     Heart sounds: Normal heart sounds.  Pulmonary:     Effort: Pulmonary effort is normal.     Breath sounds: Normal breath sounds.  Abdominal:     General: Bowel sounds are normal.     Palpations: Abdomen is soft.  Musculoskeletal:        General: Normal range of motion.     Cervical back: Normal range of motion and neck supple.  Skin:    General: Skin is warm and dry.  Neurological:     Mental Status: He is alert.  Psychiatric:        Mood and Affect: Mood normal.        Thought Content: Thought content normal.        Judgment: Judgment normal.     BP 106/64 (BP Location: Left Arm, Patient  Position: Sitting, Cuff Size: Normal)   Pulse 78   Temp 97.7 F (36.5 C) (Temporal)   SpO2  98%  Wt Readings from Last 3 Encounters:  04/19/20 254 lb 9.6 oz (115.5 kg)  10/10/19 276 lb 12.8 oz (125.6 kg)  06/07/19 273 lb (123.8 kg)     Health Maintenance Due  Topic Date Due  . Hepatitis C Screening  Never done  . COVID-19 Vaccine (1) Never done  . TETANUS/TDAP  Never done  . COLONOSCOPY (Pts 45-75yr Insurance coverage will need to be confirmed)  Never done  . PNA vac Low Risk Adult (1 of 2 - PCV13) 02/12/2019  . INFLUENZA VACCINE  Never done    There are no preventive care reminders to display for this patient.  Lab Results  Component Value Date   TSH 2.000 10/10/2019   Lab Results  Component Value Date   WBC 7.3 10/10/2019   HGB 14.7 10/10/2019   HCT 44.8 10/10/2019   MCV 82 10/10/2019   PLT 190 10/10/2019   Lab Results  Component Value Date   NA 140 10/10/2019   K 4.1 10/10/2019   CO2 25 10/10/2019   GLUCOSE 154 (H) 10/10/2019   BUN 11 10/10/2019   CREATININE 1.11 10/10/2019   BILITOT 0.7 10/10/2019   ALKPHOS 133 (H) 10/10/2019   AST 13 10/10/2019   ALT 13 10/10/2019   PROT 7.2 10/10/2019   ALBUMIN 4.0 10/10/2019   CALCIUM 9.5 10/10/2019   ANIONGAP 7 04/10/2017   Lab Results  Component Value Date   CHOL 190 10/10/2019   Lab Results  Component Value Date   HDL 38 (L) 10/10/2019   Lab Results  Component Value Date   LDLCALC 123 (H) 10/10/2019   Lab Results  Component Value Date   TRIG 162 (H) 10/10/2019   Lab Results  Component Value Date   CHOLHDL 5.0 10/10/2019   Lab Results  Component Value Date   HGBA1C 14.2 (A) 04/19/2020   HGBA1C 14.2 04/19/2020   HGBA1C 14.2 (A) 04/19/2020   HGBA1C 14.2 (A) 04/19/2020      Assessment & Plan:   1. Non compliance w medication regime  2. Gross hematuria Resolved.   3. History of blood in urine - POCT Urinalysis Dipstick  4. Type 2 diabetes mellitus with other specified complication, with  long-term current use of insulin (HCC) He will continue medication as prescribed, to decrease foods/beverages high in sugars and carbs and follow Heart Healthy or DASH diet. Increase physical activity to at least 30 minutes cardio exercise daily. - HgB A1c - Glucose (CBG) - sitaGLIPtin (JANUVIA) 25 MG tablet; Take 1 tablet (25 mg total) by mouth daily.  Dispense: 90 tablet; Refill: 11 - glipiZIDE (GLUCOTROL) 10 MG tablet; Take 1 tablet (10 mg total) by mouth 2 (two) times daily before a meal.  Dispense: 180 tablet; Refill: 3 - metFORMIN (GLUCOPHAGE) 1000 MG tablet; Take 1 tablet (1,000 mg total) by mouth 2 (two) times daily with a meal.  Dispense: 180 tablet; Refill: 3  5. Hemoglobin A1C greater than 9%, indicating poor diabetic control - HgB A1c - Glucose (CBG) - sitaGLIPtin (JANUVIA) 25 MG tablet; Take 1 tablet (25 mg total) by mouth daily.  Dispense: 90 tablet; Refill: 11 - glipiZIDE (GLUCOTROL) 10 MG tablet; Take 1 tablet (10 mg total) by mouth 2 (two) times daily before a meal.  Dispense: 180 tablet; Refill: 3 - metFORMIN (GLUCOPHAGE) 1000 MG tablet; Take 1 tablet (1,000 mg total) by mouth 2 (two) times daily with a meal.  Dispense: 180 tablet; Refill: 3  6. Hypoglycemia Patient did not eat today. Orange  juice given to patient during office visit today.   7. Hyperglycemia - HgB A1c - Glucose (CBG) - sitaGLIPtin (JANUVIA) 25 MG tablet; Take 1 tablet (25 mg total) by mouth daily.  Dispense: 90 tablet; Refill: 11 - glipiZIDE (GLUCOTROL) 10 MG tablet; Take 1 tablet (10 mg total) by mouth 2 (two) times daily before a meal.  Dispense: 180 tablet; Refill: 3 - metFORMIN (GLUCOPHAGE) 1000 MG tablet; Take 1 tablet (1,000 mg total) by mouth 2 (two) times daily with a meal.  Dispense: 180 tablet; Refill: 3  8. Proteinuria, unspecified type - Microalbumin/Creatinine Ratio, Urine  9. Essential hypertension The current medical regimen is effective; blood pressure is stable at 104/64 today;  continue present plan and medications as prescribed. He will continue to take medications as prescribed, to decrease high sodium intake, excessive alcohol intake, increase potassium intake, smoking cessation, and increase physical activity of at least 30 minutes of cardio activity daily. He will continue to follow Heart Healthy or DASH diet.  10. Mild asthma without complication, unspecified whether persistent Stable. No signs or symptoms of respiratory distress noted or reported today.  - montelukast (SINGULAIR) 10 MG tablet; Take 1 tablet (10 mg total) by mouth at bedtime.  Dispense: 90 tablet; Refill: 3  11. Seasonal allergies - montelukast (SINGULAIR) 10 MG tablet; Take 1 tablet (10 mg total) by mouth at bedtime.  Dispense: 90 tablet; Refill: 3  12. Healthcare maintenance - CBC with Differential - Comprehensive metabolic panel - Lipid Panel - TSH - Vitamin B12 - Vitamin D, 25-hydroxy - PSA  13. Follow up Keep follow up appointment.   Meds ordered this encounter  Medications  . sitaGLIPtin (JANUVIA) 25 MG tablet    Sig: Take 1 tablet (25 mg total) by mouth daily.    Dispense:  90 tablet    Refill:  11  . doxycycline (VIBRA-TABS) 100 MG tablet    Sig: Take 1 tablet (100 mg total) by mouth 2 (two) times daily.    Dispense:  14 tablet    Refill:  0  . glipiZIDE (GLUCOTROL) 10 MG tablet    Sig: Take 1 tablet (10 mg total) by mouth 2 (two) times daily before a meal.    Dispense:  180 tablet    Refill:  3  . lansoprazole (PREVACID) 30 MG capsule    Sig: Take 1 capsule (30 mg total) by mouth daily as needed (for heartburn/indigestion).    Dispense:  90 capsule    Refill:  3  . metFORMIN (GLUCOPHAGE) 1000 MG tablet    Sig: Take 1 tablet (1,000 mg total) by mouth 2 (two) times daily with a meal.    Dispense:  180 tablet    Refill:  3  . montelukast (SINGULAIR) 10 MG tablet    Sig: Take 1 tablet (10 mg total) by mouth at bedtime.    Dispense:  90 tablet    Refill:  3     Orders Placed This Encounter  Procedures  . CBC with Differential  . Comprehensive metabolic panel  . Lipid Panel  . TSH  . Vitamin B12  . Vitamin D, 25-hydroxy  . PSA  . Microalbumin/Creatinine Ratio, Urine  . POCT Urinalysis Dipstick  . HgB A1c  . Glucose (CBG)    Referral Orders  No referral(s) requested today    Kathe Becton, MSN, ANE, FNP-BC [facility] [address] [place], [place] [address] [phone] fax  Meds ordered this encounter  Medications  . sitaGLIPtin (JANUVIA) 25 MG tablet    Sig: Take 1 tablet (25 mg total) by mouth daily.    Dispense:  90 tablet    Refill:  11  . doxycycline (VIBRA-TABS) 100 MG tablet    Sig: Take 1 tablet (100 mg total) by mouth 2 (two) times daily.    Dispense:  14 tablet    Refill:  0  . glipiZIDE (GLUCOTROL) 10 MG tablet    Sig: Take 1 tablet (10 mg total) by mouth 2 (two) times daily before a meal.    Dispense:  180 tablet    Refill:  3  . lansoprazole (PREVACID) 30 MG capsule    Sig: Take 1 capsule (30 mg total) by mouth daily as needed (for heartburn/indigestion).    Dispense:  90 capsule    Refill:  3  . metFORMIN (GLUCOPHAGE) 1000 MG tablet    Sig: Take 1 tablet (1,000 mg total) by mouth 2 (two) times daily with a meal.    Dispense:  180 tablet    Refill:  3  . montelukast (SINGULAIR) 10 MG tablet    Sig: Take 1 tablet (10 mg total) by mouth at bedtime.    Dispense:  90 tablet    Refill:  3    Follow-up: No follow-ups on file.    Azzie Glatter, FNP

## 2020-09-26 ENCOUNTER — Encounter (HOSPITAL_COMMUNITY): Payer: Medicare Other

## 2020-09-30 ENCOUNTER — Encounter: Payer: Self-pay | Admitting: Family Medicine

## 2020-10-23 ENCOUNTER — Ambulatory Visit: Payer: Medicare Other | Admitting: Family Medicine

## 2020-10-29 ENCOUNTER — Other Ambulatory Visit: Payer: Self-pay | Admitting: Family Medicine

## 2020-12-02 ENCOUNTER — Inpatient Hospital Stay (HOSPITAL_COMMUNITY)
Admission: EM | Admit: 2020-12-02 | Discharge: 2020-12-21 | DRG: 286 | Disposition: A | Discharge status: Home / Self Care | Payer: Medicare Other | Attending: Cardiology | Admitting: Cardiology

## 2020-12-02 ENCOUNTER — Emergency Department (HOSPITAL_COMMUNITY): Payer: Medicare Other

## 2020-12-02 ENCOUNTER — Encounter (HOSPITAL_COMMUNITY): Payer: Self-pay | Admitting: Internal Medicine

## 2020-12-02 ENCOUNTER — Other Ambulatory Visit: Payer: Self-pay

## 2020-12-02 DIAGNOSIS — J9811 Atelectasis: Secondary | ICD-10-CM | POA: Diagnosis not present

## 2020-12-02 DIAGNOSIS — R0602 Shortness of breath: Secondary | ICD-10-CM

## 2020-12-02 DIAGNOSIS — Z95828 Presence of other vascular implants and grafts: Secondary | ICD-10-CM

## 2020-12-02 DIAGNOSIS — R310 Gross hematuria: Secondary | ICD-10-CM | POA: Diagnosis not present

## 2020-12-02 DIAGNOSIS — D631 Anemia in chronic kidney disease: Secondary | ICD-10-CM | POA: Diagnosis present

## 2020-12-02 DIAGNOSIS — Z20822 Contact with and (suspected) exposure to covid-19: Secondary | ICD-10-CM | POA: Diagnosis present

## 2020-12-02 DIAGNOSIS — I081 Rheumatic disorders of both mitral and tricuspid valves: Secondary | ICD-10-CM | POA: Diagnosis present

## 2020-12-02 DIAGNOSIS — I110 Hypertensive heart disease with heart failure: Secondary | ICD-10-CM | POA: Diagnosis not present

## 2020-12-02 DIAGNOSIS — J9601 Acute respiratory failure with hypoxia: Secondary | ICD-10-CM | POA: Diagnosis present

## 2020-12-02 DIAGNOSIS — I2510 Atherosclerotic heart disease of native coronary artery without angina pectoris: Secondary | ICD-10-CM | POA: Diagnosis present

## 2020-12-02 DIAGNOSIS — I130 Hypertensive heart and chronic kidney disease with heart failure and stage 1 through stage 4 chronic kidney disease, or unspecified chronic kidney disease: Secondary | ICD-10-CM | POA: Diagnosis not present

## 2020-12-02 DIAGNOSIS — E1122 Type 2 diabetes mellitus with diabetic chronic kidney disease: Secondary | ICD-10-CM | POA: Diagnosis present

## 2020-12-02 DIAGNOSIS — N029 Recurrent and persistent hematuria with unspecified morphologic changes: Secondary | ICD-10-CM | POA: Diagnosis not present

## 2020-12-02 DIAGNOSIS — I272 Pulmonary hypertension, unspecified: Secondary | ICD-10-CM | POA: Diagnosis present

## 2020-12-02 DIAGNOSIS — I509 Heart failure, unspecified: Secondary | ICD-10-CM | POA: Diagnosis not present

## 2020-12-02 DIAGNOSIS — D509 Iron deficiency anemia, unspecified: Secondary | ICD-10-CM | POA: Diagnosis present

## 2020-12-02 DIAGNOSIS — J90 Pleural effusion, not elsewhere classified: Secondary | ICD-10-CM | POA: Diagnosis not present

## 2020-12-02 DIAGNOSIS — I428 Other cardiomyopathies: Secondary | ICD-10-CM | POA: Diagnosis not present

## 2020-12-02 DIAGNOSIS — I340 Nonrheumatic mitral (valve) insufficiency: Secondary | ICD-10-CM | POA: Diagnosis not present

## 2020-12-02 DIAGNOSIS — Z9114 Patient's other noncompliance with medication regimen: Secondary | ICD-10-CM

## 2020-12-02 DIAGNOSIS — I5043 Acute on chronic combined systolic (congestive) and diastolic (congestive) heart failure: Secondary | ICD-10-CM | POA: Diagnosis not present

## 2020-12-02 DIAGNOSIS — Z8249 Family history of ischemic heart disease and other diseases of the circulatory system: Secondary | ICD-10-CM

## 2020-12-02 DIAGNOSIS — I5082 Biventricular heart failure: Secondary | ICD-10-CM | POA: Diagnosis present

## 2020-12-02 DIAGNOSIS — I5020 Unspecified systolic (congestive) heart failure: Secondary | ICD-10-CM

## 2020-12-02 DIAGNOSIS — K5900 Constipation, unspecified: Secondary | ICD-10-CM | POA: Diagnosis present

## 2020-12-02 DIAGNOSIS — D649 Anemia, unspecified: Secondary | ICD-10-CM | POA: Diagnosis not present

## 2020-12-02 DIAGNOSIS — I484 Atypical atrial flutter: Secondary | ICD-10-CM | POA: Diagnosis not present

## 2020-12-02 DIAGNOSIS — I4892 Unspecified atrial flutter: Secondary | ICD-10-CM | POA: Diagnosis not present

## 2020-12-02 DIAGNOSIS — I4811 Longstanding persistent atrial fibrillation: Secondary | ICD-10-CM | POA: Diagnosis present

## 2020-12-02 DIAGNOSIS — I480 Paroxysmal atrial fibrillation: Secondary | ICD-10-CM

## 2020-12-02 DIAGNOSIS — F17210 Nicotine dependence, cigarettes, uncomplicated: Secondary | ICD-10-CM | POA: Diagnosis present

## 2020-12-02 DIAGNOSIS — I959 Hypotension, unspecified: Secondary | ICD-10-CM | POA: Diagnosis not present

## 2020-12-02 DIAGNOSIS — J449 Chronic obstructive pulmonary disease, unspecified: Secondary | ICD-10-CM | POA: Diagnosis not present

## 2020-12-02 DIAGNOSIS — Z79899 Other long term (current) drug therapy: Secondary | ICD-10-CM

## 2020-12-02 DIAGNOSIS — I361 Nonrheumatic tricuspid (valve) insufficiency: Secondary | ICD-10-CM | POA: Diagnosis not present

## 2020-12-02 DIAGNOSIS — E1159 Type 2 diabetes mellitus with other circulatory complications: Secondary | ICD-10-CM | POA: Diagnosis not present

## 2020-12-02 DIAGNOSIS — E871 Hypo-osmolality and hyponatremia: Secondary | ICD-10-CM | POA: Diagnosis not present

## 2020-12-02 DIAGNOSIS — N1832 Chronic kidney disease, stage 3b: Secondary | ICD-10-CM | POA: Diagnosis not present

## 2020-12-02 DIAGNOSIS — Z452 Encounter for adjustment and management of vascular access device: Secondary | ICD-10-CM

## 2020-12-02 DIAGNOSIS — R404 Transient alteration of awareness: Secondary | ICD-10-CM | POA: Diagnosis not present

## 2020-12-02 DIAGNOSIS — E785 Hyperlipidemia, unspecified: Secondary | ICD-10-CM | POA: Diagnosis present

## 2020-12-02 DIAGNOSIS — R319 Hematuria, unspecified: Secondary | ICD-10-CM | POA: Diagnosis not present

## 2020-12-02 DIAGNOSIS — N170 Acute kidney failure with tubular necrosis: Secondary | ICD-10-CM | POA: Diagnosis not present

## 2020-12-02 DIAGNOSIS — N179 Acute kidney failure, unspecified: Secondary | ICD-10-CM | POA: Diagnosis not present

## 2020-12-02 DIAGNOSIS — R609 Edema, unspecified: Secondary | ICD-10-CM | POA: Diagnosis not present

## 2020-12-02 DIAGNOSIS — I4891 Unspecified atrial fibrillation: Secondary | ICD-10-CM

## 2020-12-02 DIAGNOSIS — E559 Vitamin D deficiency, unspecified: Secondary | ICD-10-CM | POA: Diagnosis present

## 2020-12-02 DIAGNOSIS — I152 Hypertension secondary to endocrine disorders: Secondary | ICD-10-CM | POA: Diagnosis not present

## 2020-12-02 DIAGNOSIS — I499 Cardiac arrhythmia, unspecified: Secondary | ICD-10-CM | POA: Diagnosis not present

## 2020-12-02 DIAGNOSIS — R0609 Other forms of dyspnea: Secondary | ICD-10-CM | POA: Diagnosis not present

## 2020-12-02 DIAGNOSIS — Z6838 Body mass index (BMI) 38.0-38.9, adult: Secondary | ICD-10-CM

## 2020-12-02 DIAGNOSIS — Z743 Need for continuous supervision: Secondary | ICD-10-CM | POA: Diagnosis not present

## 2020-12-02 DIAGNOSIS — R339 Retention of urine, unspecified: Secondary | ICD-10-CM | POA: Diagnosis not present

## 2020-12-02 DIAGNOSIS — I4819 Other persistent atrial fibrillation: Secondary | ICD-10-CM | POA: Diagnosis not present

## 2020-12-02 DIAGNOSIS — R34 Anuria and oliguria: Secondary | ICD-10-CM | POA: Diagnosis not present

## 2020-12-02 DIAGNOSIS — Z7901 Long term (current) use of anticoagulants: Secondary | ICD-10-CM | POA: Diagnosis not present

## 2020-12-02 DIAGNOSIS — R0689 Other abnormalities of breathing: Secondary | ICD-10-CM | POA: Diagnosis not present

## 2020-12-02 DIAGNOSIS — Z7984 Long term (current) use of oral hypoglycemic drugs: Secondary | ICD-10-CM

## 2020-12-02 DIAGNOSIS — I517 Cardiomegaly: Secondary | ICD-10-CM | POA: Diagnosis not present

## 2020-12-02 DIAGNOSIS — I5023 Acute on chronic systolic (congestive) heart failure: Secondary | ICD-10-CM | POA: Diagnosis not present

## 2020-12-02 LAB — CBC WITH DIFFERENTIAL/PLATELET
Abs Immature Granulocytes: 0.03 10*3/uL (ref 0.00–0.07)
Basophils Absolute: 0.1 10*3/uL (ref 0.0–0.1)
Basophils Relative: 1 %
Eosinophils Absolute: 0.1 10*3/uL (ref 0.0–0.5)
Eosinophils Relative: 2 %
HCT: 42.6 % (ref 39.0–52.0)
Hemoglobin: 13.1 g/dL (ref 13.0–17.0)
Immature Granulocytes: 0 %
Lymphocytes Relative: 22 %
Lymphs Abs: 2.1 10*3/uL (ref 0.7–4.0)
MCH: 23.2 pg — ABNORMAL LOW (ref 26.0–34.0)
MCHC: 30.8 g/dL (ref 30.0–36.0)
MCV: 75.4 fL — ABNORMAL LOW (ref 80.0–100.0)
Monocytes Absolute: 1.1 10*3/uL — ABNORMAL HIGH (ref 0.1–1.0)
Monocytes Relative: 12 %
Neutro Abs: 5.9 10*3/uL (ref 1.7–7.7)
Neutrophils Relative %: 63 %
Platelets: 255 10*3/uL (ref 150–400)
RBC: 5.65 MIL/uL (ref 4.22–5.81)
RDW: 18 % — ABNORMAL HIGH (ref 11.5–15.5)
WBC: 9.3 10*3/uL (ref 4.0–10.5)
nRBC: 1.4 % — ABNORMAL HIGH (ref 0.0–0.2)

## 2020-12-02 LAB — BRAIN NATRIURETIC PEPTIDE: B Natriuretic Peptide: 1158.5 pg/mL — ABNORMAL HIGH (ref 0.0–100.0)

## 2020-12-02 LAB — TROPONIN I (HIGH SENSITIVITY)
Troponin I (High Sensitivity): 28 ng/L — ABNORMAL HIGH (ref <18)
Troponin I (High Sensitivity): 30 ng/L — ABNORMAL HIGH (ref <18)

## 2020-12-02 LAB — COMPREHENSIVE METABOLIC PANEL
ALT: 17 U/L (ref 0–44)
AST: 19 U/L (ref 15–41)
Albumin: 3.6 g/dL (ref 3.5–5.0)
Alkaline Phosphatase: 104 U/L (ref 38–126)
Anion gap: 8 (ref 5–15)
BUN: 43 mg/dL — ABNORMAL HIGH (ref 8–23)
CO2: 29 mmol/L (ref 22–32)
Calcium: 9.3 mg/dL (ref 8.9–10.3)
Chloride: 101 mmol/L (ref 98–111)
Creatinine, Ser: 1.75 mg/dL — ABNORMAL HIGH (ref 0.61–1.24)
GFR, Estimated: 42 mL/min — ABNORMAL LOW (ref >60)
Glucose, Bld: 119 mg/dL — ABNORMAL HIGH (ref 70–99)
Potassium: 5.1 mmol/L (ref 3.5–5.1)
Sodium: 138 mmol/L (ref 135–145)
Total Bilirubin: 2.4 mg/dL — ABNORMAL HIGH (ref 0.3–1.2)
Total Protein: 7.3 g/dL (ref 6.5–8.1)

## 2020-12-02 LAB — SARS CORONAVIRUS 2 (TAT 6-24 HRS): SARS Coronavirus 2: NEGATIVE

## 2020-12-02 LAB — CBG MONITORING, ED: Glucose-Capillary: 101 mg/dL — ABNORMAL HIGH (ref 70–99)

## 2020-12-02 LAB — HEMOGLOBIN A1C
Hgb A1c MFr Bld: 6.4 % — ABNORMAL HIGH (ref 4.8–5.6)
Mean Plasma Glucose: 136.98 mg/dL

## 2020-12-02 LAB — PROTIME-INR
INR: 1.3 — ABNORMAL HIGH (ref 0.8–1.2)
Prothrombin Time: 16.3 seconds — ABNORMAL HIGH (ref 11.4–15.2)

## 2020-12-02 LAB — LACTIC ACID, PLASMA: Lactic Acid, Venous: 1.9 mmol/L (ref 0.5–1.9)

## 2020-12-02 LAB — HIV ANTIBODY (ROUTINE TESTING W REFLEX): HIV Screen 4th Generation wRfx: NONREACTIVE

## 2020-12-02 LAB — HEPARIN LEVEL (UNFRACTIONATED): Heparin Unfractionated: 1.06 IU/mL — ABNORMAL HIGH (ref 0.30–0.70)

## 2020-12-02 LAB — GLUCOSE, CAPILLARY: Glucose-Capillary: 138 mg/dL — ABNORMAL HIGH (ref 70–99)

## 2020-12-02 LAB — APTT: aPTT: 97 seconds — ABNORMAL HIGH (ref 24–36)

## 2020-12-02 MED ORDER — ATORVASTATIN CALCIUM 10 MG PO TABS
10.0000 mg | ORAL_TABLET | Freq: Every day | ORAL | Status: DC
  Administered 2020-12-02 – 2020-12-21 (×20): 10 mg via ORAL
  Filled 2020-12-02 (×20): qty 1

## 2020-12-02 MED ORDER — SODIUM CHLORIDE 0.9 % IV SOLN
250.0000 mL | INTRAVENOUS | Status: DC | PRN
  Administered 2020-12-16: 250 mL via INTRAVENOUS

## 2020-12-02 MED ORDER — DILTIAZEM HCL-DEXTROSE 125-5 MG/125ML-% IV SOLN (PREMIX)
5.0000 mg/h | INTRAVENOUS | Status: DC
  Administered 2020-12-02: 5 mg/h via INTRAVENOUS
  Filled 2020-12-02: qty 125

## 2020-12-02 MED ORDER — INSULIN ASPART 100 UNIT/ML IJ SOLN: 0.0000 [IU] | Freq: Every day | INTRAMUSCULAR | Status: DC

## 2020-12-02 MED ORDER — DILTIAZEM HCL 25 MG/5ML IV SOLN
10.0000 mg | Freq: Once | INTRAVENOUS | Status: AC
  Administered 2020-12-02: 10 mg via INTRAVENOUS
  Filled 2020-12-02: qty 5

## 2020-12-02 MED ORDER — HEPARIN (PORCINE) 25000 UT/250ML-% IV SOLN
1350.0000 [IU]/h | INTRAVENOUS | Status: DC
  Administered 2020-12-02 – 2020-12-03 (×2): 1600 [IU]/h via INTRAVENOUS
  Filled 2020-12-02 (×3): qty 250

## 2020-12-02 MED ORDER — LORATADINE 10 MG PO TABS
10.0000 mg | ORAL_TABLET | Freq: Every day | ORAL | Status: DC
  Administered 2020-12-03 – 2020-12-21 (×19): 10 mg via ORAL
  Filled 2020-12-02 (×19): qty 1

## 2020-12-02 MED ORDER — DILTIAZEM HCL-DEXTROSE 125-5 MG/125ML-% IV SOLN (PREMIX)
10.0000 mg/h | INTRAVENOUS | Status: DC
  Administered 2020-12-02: 10 mg/h via INTRAVENOUS
  Filled 2020-12-02 (×2): qty 125

## 2020-12-02 MED ORDER — INSULIN ASPART 100 UNIT/ML IJ SOLN
0.0000 [IU] | Freq: Three times a day (TID) | INTRAMUSCULAR | Status: DC
  Administered 2020-12-03 (×3): 1 [IU] via SUBCUTANEOUS
  Administered 2020-12-04 – 2020-12-05 (×2): 2 [IU] via SUBCUTANEOUS
  Administered 2020-12-06 – 2020-12-07 (×3): 1 [IU] via SUBCUTANEOUS
  Administered 2020-12-08: 3 [IU] via SUBCUTANEOUS
  Administered 2020-12-10 – 2020-12-15 (×3): 1 [IU] via SUBCUTANEOUS
  Administered 2020-12-16: 2 [IU] via SUBCUTANEOUS
  Administered 2020-12-17 (×2): 1 [IU] via SUBCUTANEOUS
  Administered 2020-12-18: 2 [IU] via SUBCUTANEOUS
  Administered 2020-12-19: 1 [IU] via SUBCUTANEOUS

## 2020-12-02 MED ORDER — FUROSEMIDE 10 MG/ML IJ SOLN
120.0000 mg | Freq: Every day | INTRAVENOUS | Status: DC
  Filled 2020-12-02: qty 12

## 2020-12-02 MED ORDER — ACETAMINOPHEN 325 MG PO TABS: 650.0000 mg | ORAL_TABLET | ORAL | Status: DC | PRN

## 2020-12-02 MED ORDER — SODIUM CHLORIDE 0.9% FLUSH
3.0000 mL | Freq: Two times a day (BID) | INTRAVENOUS | Status: DC
  Administered 2020-12-03 – 2020-12-16 (×18): 3 mL via INTRAVENOUS

## 2020-12-02 MED ORDER — HEPARIN BOLUS VIA INFUSION
4000.0000 [IU] | Freq: Once | INTRAVENOUS | Status: AC
  Administered 2020-12-02: 4000 [IU] via INTRAVENOUS
  Filled 2020-12-02: qty 4000

## 2020-12-02 MED ORDER — MONTELUKAST SODIUM 10 MG PO TABS
10.0000 mg | ORAL_TABLET | Freq: Every day | ORAL | Status: DC
  Administered 2020-12-02 – 2020-12-20 (×17): 10 mg via ORAL
  Filled 2020-12-02 (×17): qty 1

## 2020-12-02 MED ORDER — SODIUM CHLORIDE 0.9 % IV SOLN: INTRAVENOUS | Status: DC

## 2020-12-02 MED ORDER — ONDANSETRON HCL 4 MG/2ML IJ SOLN: 4.0000 mg | Freq: Four times a day (QID) | INTRAMUSCULAR | Status: DC | PRN

## 2020-12-02 MED ORDER — SODIUM CHLORIDE 0.9% FLUSH
3.0000 mL | INTRAVENOUS | Status: DC | PRN
  Administered 2020-12-06: 3 mL via INTRAVENOUS

## 2020-12-02 MED ORDER — FUROSEMIDE 10 MG/ML IJ SOLN: 80.0000 mg | Freq: Every day | INTRAMUSCULAR | Status: DC

## 2020-12-02 NOTE — ED Notes (Signed)
IV team is in room for access

## 2020-12-02 NOTE — ED Notes (Signed)
Got patient undressed on the monitor did ekg shown to Dr Zammit patient is resting with call bell in reach 

## 2020-12-02 NOTE — H&P (Addendum)
Cardiology Admission History and Physical:   Patient ID: Kevin [name] MRN: [id number]; DOB: 1955/07/28   Admission date: 12/02/2020  PCP:  Azzie Glatter, FNP (Inactive)   [facility] Providers Cardiologist:  Pixie Casino, MD   { Click here to update MD or APP on Care Team, Refresh:     Chief Complaint:  SOB  Patient Profile:   Kevin [name] is a 65 y.o. male with a PMH of chronic combined CHF, NICM, non-obstructive CAD, paroxysmal atrial fibrillation, moderate mitral regurgitation, HTN, DM type 2, COPD, tobacco abuse, who is being seen 12/02/2020 for the evaluation of CHF/Afib.  History of Present Illness:   Kevin [name] cardiomyopathy history dates back to 2016 when he was found to have an EF of 25-30%. LHC at that time showed mild non-obstructive CAD with 40% pLAD stenosis noted. He also had paroxysmal atrial fibrillation at that time. He was subsequently admitted in 2018 for atrial fibrillation and underwent successful cardioversion. Seen by Dr. Curt Bears for ICD consideration in 2018 given persistently low EF, however patient declined ICD implantation. He was subsequently transitioned to metoprolol succinate and entresto for management of his cardiomyopathy and EF improved to 55-60% on echo in 2019.  At his last outpatient visit with cardiology 09/2018 he was doing well from a cardiac standpoint. He was recommended to continue metoprolol succinate, entresto, and spironolactone. He was recommended to follow-up in 12 months however was lost to follow-up.   He presented to the ED 12/02/20 with complaints of SOB and LE edema. EMS activated and patient placed on NRB for hypoxia and transported to [facility] for further evaluation. On arrival to the ED he was tachycardic tot he 160s, improved to 110s, intermittently hypotensive and tachypneic, satting in the 90s on O2 via [place] at 2L, and afebrile. EKG showed atrial fibrillation with RVR with rate 168 bpm and non-specific ST-T wave abnormalities. Labs  notable for K 5.1, Cr 1.75, Hgb 13.1, PLT 255, HsTrop 28>30, BNP 1158. CXR showed opacification of right lower/mid lung c/f combination of pleural effusion and atelectasis/consolidation. He was started on a diltiazem gtt with improvement in HR. Cardiology asked to evaluate.   At the time of this evaluation he reports worsening SOB and LE edema for the past 1-2 months. He thinks he has gained ~30lbs in that time frame. He has had orthopnea and PND. Some chest tightness when laying down, though none with activity. He has appreciated occasional flutters in his chest though is largely unaware of his atrial fibrillation. He is unaware of any recurrent atrial fibrillation since his cardioversion in 2018. He denies palpitations, dizziness, lightheadedness, or syncope. He does note a slight dry cough for the past couple days but denies fever, chills, or sweats. Additionally he reports intermittent hematuria for the past several months, occurring for weeks at a time before resolving. Last episode was 1 week ago. Pink tinged urine without complaints of dysuria or abdominal pain. He does not occasional urinary hesitancy. He has not seen a urologist outpatient for this. He reports compliance with his medications initially, however after further questioning states he has not taken any medications in the past 3 days because he was feeling unwell and knew he was going to come to the hospital today.   Past Medical History:  Diagnosis Date   Asthma    Chronic systolic CHF (congestive heart failure) ([place])    a. 06/2015 Echo: EF 25-30%, mod-sev MR, PASP 29mHg. b. 08/2016: echo showing EF of 20-25% with diffuse HK  Cocaine use    Essential hypertension    Hyperglycemia 09/2019   Hyperlipidemia 09/2019   Longstanding persistent atrial fibrillation ([place])    a. Initially dx 06/2015, paroxysmal at that time. b. Recurrent in Feb 2018 and beyond.   Moderate mitral regurgitation    a. 06/2015 Echo: Mod-Sev MR. b. 08/2016:  echo showing moderate MR.    NICM (nonischemic cardiomyopathy) ([place])    a. 06/2015 Echo: EF 25-30%, normal cors by cath - ? Tachy-mediated;     Non-obstructive CAD    a. 06/2015 Cath: LM nl, LAD 16m LCX nl, RCA nl, EF 25-30%.   Tobacco use    Vitamin D deficiency     Past Surgical History:  Procedure Laterality Date   CARDIAC CATHETERIZATION N/A 07/26/2015   Procedure: Left Heart Cath and Coronary Angiography;  Surgeon: JJettie Booze MD;  [facility];  Service: Cardiovascular;  Laterality: N/A;   CARDIOVERSION N/A 04/09/2017   Procedure: CARDIOVERSION;  Surgeon: RFay Records MD;  [facility]  Service: Cardiovascular;  Laterality: N/A;   TEE WITHOUT CARDIOVERSION N/A 04/09/2017   Procedure: TRANSESOPHAGEAL ECHOCARDIOGRAM (TEE);  Surgeon: RFay Records MD;  [facility];  Service: Cardiovascular;  Laterality: N/A;     Medications Prior to Admission: Prior to Admission medications   Medication Sig Start Date End Date Taking? Authorizing Provider  ACCU-CHEK GUIDE test strip TEST UP TO 4 TIMES A DAY AS DIRECTED 09/03/20   SAzzie Glatter FNP  Accu-Chek Softclix Lancets lancets USE UP TO 4 TIMES A DAY AS DIRECTED 10/29/20   SAzzie Glatter FNP  albuterol (PROVENTIL) (2.5 MG/3ML) 0.083% nebulizer solution Take 3 mLs (2.5 mg total) by nebulization every 6 (six) hours as needed for wheezing or shortness of breath. 04/19/20   SAzzie Glatter FNP  albuterol (VENTOLIN HFA) 108 (90 Base) MCG/ACT inhaler Inhale 2 puffs into the lungs every 6 (six) hours as needed for wheezing or shortness of breath. 04/19/20   SAzzie Glatter FNP  apixaban (ELIQUIS) 5 MG TABS tablet Take 1 tablet (5 mg total) by mouth 2 (two) times daily. 04/19/20   SAzzie Glatter FNP  atorvastatin (LIPITOR) 10 MG tablet Take 1 tablet (10 mg total) by mouth daily. 04/19/20   SAzzie Glatter FNP  blood glucose meter kit and supplies Dispense based on patient and insurance  preference. Use up to four times daily as directed. (FOR ICD-10 E10.9, E11.9). 04/19/20   SAzzie Glatter FNP  cetirizine (ZYRTEC) 10 MG tablet TAKE 1 TABLET BY MOUTH EVERY DAY 09/23/20   SAzzie Glatter FNP  cyclobenzaprine (FLEXERIL) 10 MG tablet TAKE 1 TABLET BY MOUTH THREE TIMES A DAY AS NEEDED FOR MUSCLE SPASMS 10/10/19   SAzzie Glatter FNP  doxycycline (VIBRA-TABS) 100 MG tablet Take 1 tablet (100 mg total) by mouth 2 (two) times daily. 09/25/20   SAzzie Glatter FNP  fluticasone ([facility] 50 MCG/ACT nasal spray SPRAY 2 SPRAYS INTO EACH NOSTRIL EVERY DAY 05/20/20   SAzzie Glatter FNP  furosemide (LASIX) 40 MG tablet Take 1 tablet (40 mg total) by mouth 2 (two) times daily. 04/19/20   SAzzie Glatter FNP  glipiZIDE (GLUCOTROL) 10 MG tablet Take 1 tablet (10 mg total) by mouth 2 (two) times daily before a meal. 09/25/20   SAzzie Glatter FNP  lansoprazole (PREVACID) 30 MG capsule Take 1 capsule (30 mg total) by mouth daily as needed (for heartburn/indigestion). 09/25/20   SKathe Becton  M, FNP  metFORMIN (GLUCOPHAGE) 1000 MG tablet Take 1 tablet (1,000 mg total) by mouth 2 (two) times daily with a meal. 09/25/20   Azzie Glatter, FNP  metoprolol succinate (TOPROL-XL) 50 MG 24 hr tablet Take 2 tablets (131m) in the morning, 1 tablet (56m in the evening with or after meal. Patient not taking: Reported on 09/25/2020 01/18/18   CaConstance HawMD  montelukast (SINGULAIR) 10 MG tablet Take 1 tablet (10 mg total) by mouth at bedtime. 09/25/20   StAzzie GlatterFNP  sacubitril-valsartan (ENTRESTO) 49-51 MG Take 1 tablet by mouth 2 (two) times daily. 04/19/20   StAzzie GlatterFNP  sitaGLIPtin (JANUVIA) 25 MG tablet Take 1 tablet (25 mg total) by mouth daily. 09/25/20   StAzzie GlatterFNP  spironolactone (ALDACTONE) 25 MG tablet TAKE 1/2 TO 1 (ONE-HALF TO ONE) TABLET BY MOUTH ONCE DAILY AS  DIRECTED. Please make appt for future refills. [phone] attempt. Patient not  taking: Reported on 09/25/2020 02/15/19   CaConstance HawMD  Vitamin D, Ergocalciferol, (DRISDOL) 1.25 MG (50000 UNIT) CAPS capsule TAKE 1 CAPSULE (50,000 UNITS TOTAL) BY MOUTH EVERY 7 (SEVEN) DAYS. 03/04/20   StAzzie GlatterFNP     Allergies:   No Known Allergies  Social History:   Social History   Socioeconomic History   Marital status: Single    Spouse name: Not on file   Number of children: Not on file   Years of education: Not on file   Highest education level: Not on file  Occupational History    Employer: [employer]  Tobacco Use   Smoking status: Former Smoker    Packs/day: 0.50    Years: 40.00    Pack years: 20.00    Types: Cigarettes   Smokeless tobacco: Never Used   Tobacco comment: Quit in 2014  Vaping Use   Vaping Use: Never used  Substance and Sexual Activity   Alcohol use: Yes    Alcohol/week: 0.0 standard drinks    Comment: 2-3 drinks per week.   Drug use: Yes    Comment: Not currently. Quit in 2014. 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   Sexual activity: Not Currently  Other Topics Concern   Not on file  Social History Narrative   Not on file   Social Determinants of Health   Financial Resource Strain: Not on file  Food Insecurity: Not on file  Transportation Needs: Not on file  Physical Activity: Not on file  Stress: Not on file  Social Connections: Not on file  Intimate Partner Violence: Not on file    Family History:   The patient's family history includes Hypertension in his father.    ROS:  Please see the history of present illness.  All other ROS reviewed and negative.     Physical Exam/Data:   Vitals:   12/02/20 1259 12/02/20 1432 12/02/20 1433 12/02/20 1500  BP: 109/90 (!) 152/136 115/76 109/79  Pulse: (!) 112 (!) 44 99 61  Resp: 18 18 18 20   Temp: 97.7 F (36.5 C)     TempSrc: Oral     SpO2: 95% 99% 96% 93%  Weight:      Height:        Intake/Output Summary (Last 24 hours) at 12/02/2020 1512 Last data filed at 12/02/2020  0901 Gross per 24 hour  Intake 0 ml  Output 0 ml  Net 0 ml   Last 3 Weights 12/02/2020 04/19/2020 10/10/2019  Weight (lbs) 280 lb  254 lb 9.6 oz 276 lb 12.8 oz  Weight (kg) 127.007 kg 115.486 kg 125.556 kg     Body mass index is 36.94 kg/m.  General:  Obese gentleman sitting on the side of the bed in no acute distress HEENT: sclera anicteric Neck: +JVD Vascular: No carotid bruits; distal pulses difficult to appreciate with degree of LE edema Cardiac:  normal S1, S2; RRR; no murmurs, rubs, or gallops Lungs:  clear to auscultation bilaterally, no wheezing, rhonchi or rales  Abd: soft, obese, distended, nontender, no hepatomegaly  Ext: 3+ edema, cool to touch Musculoskeletal:  No deformities, BUE and BLE strength normal and equal Skin: warm and dry  Neuro:  CNs 2-12 intact, no focal abnormalities noted Psych:  Normal affect    EKG:  atrial fibrillation with RVR with rate 168 bpm and non-specific ST-T wave abnormalities  Relevant CV Studies: Left heart catheterization 2016: Mild nonobstructive CAD, most notably in the LAD. There is severe left ventricular systolic dysfunction.   Continue aggressive medical therapy to treat his nonischemic cardiomyopathy.  Diagnostic Dominance: Right      Echocardiogram 2016: Study Conclusions   - Left ventricle: Severe inferior and inferolateral wall    hypokinesis The cavity size was moderately dilated. Wall    thickness was normal. Systolic function was severely reduced. The    estimated ejection fraction was in the range of 25% to 30%.  - Mitral valve: Moderate to severe MR may be worse Likely ischemic    given low EF and RWMAls Consider TEE if clinically indicated.  - Left atrium: The atrium was moderately dilated.  - Atrial septum: No defect or patent foramen ovale was identified.  - Pulmonary arteries: PA peak pressure: 37 mm Hg (S).   Echocardiogram 2019: Study Conclusions   - Left ventricle: The cavity size was normal.  Systolic function was    normal. The estimated ejection fraction was in the range of 55%    to 60%. Regional wall motion abnormalities cannot be excluded,    however, left ventricular function and wall motion are grossly    normal. Doppler parameters are consistent with abnormal left    ventricular relaxation (grade 1 diastolic dysfunction).  - Aortic valve: Trileaflet; normal thickness leaflets.    Transvalvular velocity was within the normal range. There was no    stenosis. There was trivial regurgitation. Mean gradient (S): 4    mm Hg.  - Mitral valve: Transvalvular velocity was within the normal range.    There was no evidence for stenosis. There was trivial    regurgitation.  - Left atrium: The atrium was mildly dilated.  - Right ventricle: The cavity size was normal. Wall thickness was    normal. Systolic function was normal. RV systolic pressure (S,    est): 32 mm Hg.  - Right atrium: The atrium was normal in size.  - Tricuspid valve: There was trivial regurgitation.  - Pulmonary arteries: Systolic pressure was mildly increased. PA    peak pressure: 32 mm Hg (S).  - Inferior vena cava: The vessel was normal in size. Consistent    with normal central venous pressure.  - Pericardium, extracardiac: There was no pericardial effusion.   Laboratory Data:  High Sensitivity Troponin:   Recent Labs  Lab 12/02/20 0914 12/02/20 1115  TROPONINIHS 28* 30*      Chemistry Recent Labs  Lab 12/02/20 0914  NA 138  K 5.1  CL 101  CO2 29  GLUCOSE 119*  BUN 43*  CREATININE 1.75*  CALCIUM 9.3  GFRNONAA 42*  ANIONGAP 8    Recent Labs  Lab 12/02/20 0914  PROT 7.3  ALBUMIN 3.6  AST 19  ALT 17  ALKPHOS 104  BILITOT 2.4*   Hematology Recent Labs  Lab 12/02/20 0914  WBC 9.3  RBC 5.65  HGB 13.1  HCT 42.6  MCV 75.4*  MCH 23.2*  MCHC 30.8  RDW 18.0*  PLT 255   BNP Recent Labs  Lab 12/02/20 0915  BNP 1,158.5*    DDimer No results for input(s): DDIMER in the last  168 hours.   Radiology/Studies:  DG Chest Port 1 View  Result Date: 12/02/2020 CLINICAL DATA:  Shortness of breath EXAM: PORTABLE CHEST 1 VIEW COMPARISON:  September 2018 FINDINGS: Opacification of right lower and mid lung. Left lung is clear. No pneumothorax. Cardiomegaly. IMPRESSION: Opacification of right lower and mid lung likely reflecting combination of pleural effusion and atelectasis/consolidation. Cardiomegaly. Electronically Signed   By: Macy Mis M.D.   On: 12/02/2020 09:55     Assessment and Plan:   1. Paroxysmal atrial fibrillation with RVR: patient has known history of paroxysmal atrial fibrillation. Recent outpatient visit with PCP 09/2020 suggested noncompliance. HR elevated to 160s on arrival, improved to 110s after starting diltiazem gtt. Has required previous DCCV in 2018. He does have history of NICM with EF down to 25-30% in 2016 with subsequent recovery of EF to 55-60% in 2019 after starting entresto. Suspect EF is back down again given his degree of volume overload. He has been non-compliant with his eliquis for the past 3 days. - Will continue diltiazem gtt for now - though anticipate this may need to be discontinued if EF is reduced again. - Plan for TEE/DCCV 12/05/20 should he remain in atrial fibrillation - Shared Decision Making/Informed Consent{ The risks [stroke, cardiac arrhythmias rarely resulting in the need for a temporary or permanent pacemaker, skin irritation or burns, esophageal damage, perforation (1:10,000 risk), bleeding, pharyngeal hematoma as well as other potential complications associated with conscious sedation including aspiration, arrhythmia, respiratory failure and death], benefits (treatment guidance, restoration of normal sinus rhythm, diagnostic support) and alternatives of a transesophageal echocardiogram guided cardioversion were discussed in detail with Mr. Croft and he is willing to proceed. - Will update an echocardiogram  - Will check  TSH - Continue transition to heparin gtt for now given possible need for thoracentesis this admission. Anticipate resuming home eliquis prior to discharge  2. Acute on chronic combined CHF: patient presented with acute hypoxic respiratory failure with O2 sats in the 80s on RA.He has had progressive DOE, LE edema, orthopnea, PND, and ~20-30lb weight gain over the past several months. BNP 1158. CXR c/f pleural effusion and possible atelectasis/consolidation in the right mid/lower lobes. No fevers or elevated WBC to suggest infectious etiology. He reports compliance with his medication with the exception of the past several days though suspect this is inaccurate. Extremities are markedly edematous and cool to touch, lungs with decreased breath sounds at lung bases, abdomin distended. Suspect EF is down again. - Will check lactate - Will trial IV lasix 122m daily and monitor for response - Will update an echocardiogram  - Will repeat CXR in AM - consider thoracentesis if effusion is unchanged - Will hold home entresto/spironolactone in the setting of elevated Cr - Will hold home BBlocker pending lactate in the setting of acute on chronic decompensated heart failure - Continue to monitor strict I&Os and daily weights  3. Non-obstructive CAD: 40%  LAD stenosis noted on LHC in 2016 at the time of his cardiomyopathy diagnosis. No recent chest pain complaints. HsTrop with low flat trend not c/w ACS. EKG non-ischemic. Not on aspirin due to need for anticoagulation - Continue statin  4. HTN: BP generally stable though soft at times. Will hold home entresto and spironolactone in the setting of AKI.  - Continue to monitor closely with management of #1 and 2  5. HLD: LDL 123 09/2019. - Will update FLP this admission - low threshold to uptitrate statin if LDL not at goal of <70 - Continue atorvastatin  6. DM type 2: poorly controlled with A1C 14.2 03/2020.  - Will repeat A1C - Will hold home medications and  continue ISS while admitted - Will ask DM coordinator to assist with management - Would ideally get him on an SGLT-2 inhibitor if Cr returns to baseline.   7. AKI: Cr up to 1.75 today from baseline 1.1 as of 09/2019.  - Will hold nephrotoxic agents including home entresto and spironolactone  - Continue to monitor closely with diuresis efforts  8. Hematuria: occurs intermittently. No associated dysuria or abdominal pain though reports some hesitancy. Has never seen urology. Does have tobacco use history though quit 7 years ago. No active hematuria at this time and Hgb stable - Will need referral to urology at discharge   Risk Assessment/Risk Scores:   New York Heart Association (NYHA) Functional Class NYHA Class III  CHA2DS2-VASc Score = 5  This indicates a 7.2% annual risk of stroke. The patient's score is based upon: CHF History: Yes HTN History: Yes Diabetes History: Yes Stroke History: No Vascular Disease History: Yes Age Score: 1 Gender Score: 0   Severity of Illness: The appropriate patient status for this patient is admission. Inpatient status is judged to be reasonable and necessary in order to provide the required intensity of service to ensure the patient's safety. The patient's presenting symptoms, physical exam findings, and initial radiographic and laboratory data in the context of their medical condition is felt to place them at decreased risk for further clinical deterioration. Furthermore, it is anticipated that the patient will be medically stable for discharge from the hospital within 2 midnights of admission. The following factors support the patient status of observation.   " The patient's presenting symptoms include progressive DOE, LE edema, orthopnea, PND, and ~20-30lb weight gain over the past several months. " The physical exam findings include Extremities are markedly edematous and cool to touch, lungs with decreased breath sounds at lung bases, abdomin  distended.. " The initial radiographic and laboratory data are pleural effusion, elevated BNP, and EKG with atrial fibrillation with RVR.     For questions or updates, please contact [place] Please consult [email or web address] for contact info under     Signed, Abigail Butts, PA-C  12/02/2020 3:12 PM   Personally seen and examined. Agree with APP above with the following comments: Briefly 65 yo M with history of Morbid Obesity, Atrial Moderate MR, PAF, COPD NOS, Tobacco Abuse, and DM (last A1c 14) who presents with CHF and AKI Patient notes 50 lbs weight gain, LE edema, SOB, slight hematuria, and medication non-adherences (missed three days of AC) Exam notable for cold lower extremities dullness to percussion and abdominal fluid wave, systolic murmur, and irregular rhythm.  Coarse breath sounds R> L Labs notable for Creatine 1.75 Personally reviewed relevant tests; notable bilateral pleural effusions Would recommend admission - will start 120 IV Lasix BID and will follow  electrolytes for K goal of 4, Mg goal of 2 - Holding ARNI; will eventually need ARNI and MRA; will also need SGLT2i - DOAC-> Heparin; will potentially need thoracentesis - Sending Lactate; low threshold for PICC line and low dose milrinone if elevated - Planned for TEE/DCCV this week - AC and statin for CAD - Asking for DM coordinator assist and will need SGLT2i at DC if able  Rudean Haskell, MD Cardiologist [facility]  [address] [place], [place] [address] [phone]  3:38 PM

## 2020-12-02 NOTE — ED Provider Notes (Signed)
[place] EMERGENCY DEPARTMENT Provider Note   CSN: [id number] Arrival date & time: 12/02/20  0848     History Chief Complaint  Patient presents with  . Shortness of Breath    Kevin [name] is a 65 y.o. male.  Patient complains of shortness of breath and swelling in his legs.  Patient does not have any chest pain no fever chills cough  The history is provided by the patient and medical records. No language interpreter was used.  Shortness of Breath Severity:  Moderate Onset quality:  Sudden Timing:  Constant Progression:  Waxing and waning Chronicity:  New Context: activity   Relieved by:  Nothing Worsened by:  Nothing Ineffective treatments:  None tried Associated symptoms: no abdominal pain, no chest pain, no cough, no headaches and no rash        Past Medical History:  Diagnosis Date  . Asthma   . Chronic systolic CHF (congestive heart failure) ([place])    a. 06/2015 Echo: EF 25-30%, mod-sev MR, PASP 61mHg. b. 08/2016: echo showing EF of 20-25% with diffuse HK  . Cocaine use   . Essential hypertension   . Hyperglycemia 09/2019  . Hyperlipidemia 09/2019  . Longstanding persistent atrial fibrillation ([facility]    a. Initially dx 06/2015, paroxysmal at that time. b. Recurrent in Feb 2018 and beyond.  . Moderate mitral regurgitation    a. 06/2015 Echo: Mod-Sev MR. b. 08/2016: echo showing moderate MR.   .Marland KitchenNICM (nonischemic cardiomyopathy) ([place]    a. 06/2015 Echo: EF 25-30%, normal cors by cath - ? Tachy-mediated;    . Non-obstructive CAD    a. 06/2015 Cath: LM nl, LAD 448mLCX nl, RCA nl, EF 25-30%.  . Tobacco use   . Vitamin D deficiency     Patient Active Problem List   Diagnosis Date Noted  . Obese abdomen 06/08/2019  . Class 2 severe obesity due to excess calories with serious comorbidity and body mass index (BMI) of 36.0 to 36.9 in adult ([facility]11/06/2019  . Muscle spasm 03/31/2019  . Sinus congestion 12/21/2018  . Tobacco use   .  Non-obstructive CAD   . NICM (nonischemic cardiomyopathy) ([facility]  . Moderate mitral regurgitation   . Cocaine use   . Asthma   . Persistent atrial fibrillation ([facility]  . DCM (dilated cardiomyopathy) ([facility]  . CHF (congestive heart failure) (HCS[place]09/03/2017  . Current use of long term anticoagulation 02/24/2017  . Hypotension 02/24/2017  . CKD (chronic kidney disease), stage II 02/08/2017  . History of cocaine use 02/08/2017  . Former tobacco use 02/08/2017  . Atrial fibrillation with rapid ventricular response (H[facility] [place]  . Longstanding persistent atrial fibrillation (HCDare03/26/2018  . AKI (acute kidney injury) (HCHumbold[facility]03/26/2018  . Mitral regurgitation 09/28/2016  . Essential hypertension 09/28/2016  . Acute on chronic systolic heart failure (HCLaurel Lake02/20/2018  . Non-ischemic cardiomyopathy (HCSal[place]  . Coronary artery disease involving native coronary artery of native heart without angina pectoris   . Chronic systolic heart failure (HCS[facility]12/30/2016  . Paroxysmal atrial fibrillation (HC[facility]12/27/2016  . COPD (chronic obstructive pulmonary disease) (HCClarissa12/27/2016  . Elevated troponin 07/23/2015  . Elevated brain natriuretic peptide (BNP) level 07/23/2015    Past Surgical History:  Procedure Laterality Date  . CARDIAC CATHETERIZATION N/A 07/26/2015   Procedure: Left Heart Cath and Coronary Angiography;  Surgeon: JaJettie BoozeMD;  [facility];  Service: Cardiovascular;  Laterality: N/A;  . CARDIOVERSION N/A  04/09/2017   Procedure: CARDIOVERSION;  Surgeon: Fay Records, MD;  Location[facility] ENDOSCOPY;  Service: Cardiovascular;  Laterality: N/A;  . TEE WITHOUT CARDIOVERSION N/A 04/09/2017   Procedure: TRANSESOPHAGEAL ECHOCARDIOGRAM (TEE);  Surgeon: Fay Records, MD;  Location: [facility] ENDOSCOPY;  Service: Cardiovascular;  Laterality: N/A;       Family History  Problem Relation Age of Onset  . Hypertension Father     Social History   Tobacco Use  . Smoking status:  Former Smoker    Packs/day: 0.50    Years: 40.00    Pack years: 20.00    Types: Cigarettes  . Smokeless tobacco: Never Used  . Tobacco comment: Quit in 2014  Vaping Use  . Vaping Use: Never used  Substance Use Topics  . Alcohol use: Yes    Alcohol/week: 0.0 standard drinks    Comment: 2-3 drinks per week.  . Drug use: Yes    Comment: Not currently. Quit in 2014. 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    Home Medications Prior to Admission medications   Medication Sig Start Date End Date Taking? Authorizing Provider  albuterol (PROVENTIL) (2.5 MG/3ML) 0.083% nebulizer solution Take 3 mLs (2.5 mg total) by nebulization every 6 (six) hours as needed for wheezing or shortness of breath. Patient taking differently: Take 2.5 mg by nebulization See admin instructions. Nebulize 2.5 mg and inhale into the lungs up to six times a day as needed for shortness of breath or wheezing 04/19/20  Yes Azzie Glatter, FNP  albuterol (VENTOLIN HFA) 108 (90 Base) MCG/ACT inhaler Inhale 2 puffs into the lungs every 6 (six) hours as needed for wheezing or shortness of breath. Patient taking differently: Inhale 2 puffs into the lungs See admin instructions. Inhale 2 puffs into the lungs up to 10-12 times a day (max) as needed for shortness of breath or wheezing 04/19/20  Yes Azzie Glatter, FNP  apixaban (ELIQUIS) 5 MG TABS tablet Take 1 tablet (5 mg total) by mouth 2 (two) times daily. 04/19/20  Yes Azzie Glatter, FNP  atorvastatin (LIPITOR) 10 MG tablet Take 1 tablet (10 mg total) by mouth daily. 04/19/20  Yes Azzie Glatter, FNP  cetirizine (ZYRTEC) 10 MG tablet TAKE 1 TABLET BY MOUTH EVERY DAY Patient taking differently: Take 10 mg by mouth daily. 09/23/20  Yes Azzie Glatter, FNP  fluticasone (FLONASE) 50 MCG/ACT nasal spray SPRAY 2 SPRAYS INTO EACH NOSTRIL EVERY DAY Patient taking differently: Place 1 spray into both nostrils See admin instructions. Instill 1 spray into each nostril up to six times a day  as needed for congestion 05/20/20  Yes Azzie Glatter, FNP  furosemide (LASIX) 40 MG tablet Take 1 tablet (40 mg total) by mouth 2 (two) times daily. Patient taking differently: Take 80 mg by mouth in the morning. 04/19/20  Yes Azzie Glatter, FNP  glipiZIDE (GLUCOTROL) 10 MG tablet Take 1 tablet (10 mg total) by mouth 2 (two) times daily before a meal. 09/25/20  Yes Azzie Glatter, FNP  lansoprazole (PREVACID) 30 MG capsule Take 1 capsule (30 mg total) by mouth daily as needed (for heartburn/indigestion). 09/25/20  Yes Azzie Glatter, FNP  metFORMIN (GLUCOPHAGE) 1000 MG tablet Take 1 tablet (1,000 mg total) by mouth 2 (two) times daily with a meal. 09/25/20  Yes Azzie Glatter, FNP  montelukast (SINGULAIR) 10 MG tablet Take 1 tablet (10 mg total) by mouth at bedtime. 09/25/20  Yes Azzie Glatter, FNP  Multiple Vitamins-Minerals (ADULT ONE  DAILY GUMMIES) CHEW Chew 1-2 tablets by mouth daily.   Yes [provider]  naproxen sodium (ALEVE) 220 MG tablet Take 220-440 mg by mouth daily as needed (for pain).   Yes [provider]  OVER THE COUNTER MEDICATION Place 1-2 drops into both eyes See admin instructions. [place] Maximum Redness Relief Cooling Eye Drops- Place 1-2 drops into both eyes up to 4 times a day as needed for irritation   Yes [provider]  sacubitril-valsartan (ENTRESTO) 49-51 MG Take 1 tablet by mouth 2 (two) times daily. 04/19/20  Yes Azzie Glatter, FNP  sitaGLIPtin (JANUVIA) 25 MG tablet Take 1 tablet (25 mg total) by mouth daily. 09/25/20  Yes Azzie Glatter, FNP  ACCU-CHEK GUIDE test strip TEST UP TO 4 TIMES A DAY AS DIRECTED Patient taking differently: 1 each See admin instructions. TEST UP TO 4 TIMES A DAY AS DIRECTED 09/03/20   Azzie Glatter, FNP  Accu-Chek Softclix Lancets lancets USE UP TO 4 TIMES A DAY AS DIRECTED Patient taking differently: 1 each See admin instructions. USE UP TO 4 TIMES A DAY AS DIRECTED 10/29/20   Azzie Glatter,  FNP  blood glucose meter kit and supplies Dispense based on patient and insurance preference. Use up to four times daily as directed. (FOR ICD-10 E10.9, E11.9). 04/19/20   Azzie Glatter, FNP  cyclobenzaprine (FLEXERIL) 10 MG tablet TAKE 1 TABLET BY MOUTH THREE TIMES A DAY AS NEEDED FOR MUSCLE SPASMS Patient not taking: Reported on 12/02/2020 10/10/19   Azzie Glatter, FNP  doxycycline (VIBRA-TABS) 100 MG tablet Take 1 tablet (100 mg total) by mouth 2 (two) times daily. Patient not taking: Reported on 12/02/2020 09/25/20   Azzie Glatter, FNP  metoprolol succinate (TOPROL-XL) 50 MG 24 hr tablet Take 2 tablets (19m) in the morning, 1 tablet (568m in the evening with or after meal. Patient not taking: No sig reported 01/18/18   CaConstance HawMD  spironolactone (ALDACTONE) 25 MG tablet TAKE 1/2 TO 1 (ONE-HALF TO ONE) TABLET BY MOUTH ONCE DAILY AS  DIRECTED. Please make appt for future refills. [phone] attempt. Patient not taking: Reported on 12/02/2020 02/15/19   CaConstance HawMD  Vitamin D, Ergocalciferol, (DRISDOL) 1.25 MG (50000 UNIT) CAPS capsule TAKE 1 CAPSULE (50,000 UNITS TOTAL) BY MOUTH EVERY 7 (SEVEN) DAYS. Patient not taking: Reported on 12/02/2020 03/04/20   StAzzie GlatterFNP    Allergies    Patient has no known allergies.  Review of Systems   Review of Systems  Constitutional: Negative for appetite change and fatigue.  HENT: Negative for congestion, ear discharge and sinus pressure.   Eyes: Negative for discharge.  Respiratory: Positive for shortness of breath. Negative for cough.   Cardiovascular: Negative for chest pain.  Gastrointestinal: Negative for abdominal pain and diarrhea.  Genitourinary: Negative for frequency and hematuria.  Musculoskeletal: Negative for back pain.  Skin: Negative for rash.  Neurological: Negative for seizures and headaches.  Psychiatric/Behavioral: Negative for hallucinations.    Physical Exam Updated Vital Signs BP  115/76 (BP Location: Right Arm)   Pulse 99   Temp 97.7 F (36.5 C) (Oral)   Resp 18   Ht 6' 1"  (1.854 m)   Wt 127 kg   SpO2 96%   BMI 36.94 kg/m   Physical Exam Vitals and nursing note reviewed.  Constitutional:      Appearance: He is well-developed.  HENT:     Head: Normocephalic.  Eyes:  General: No scleral icterus.    Conjunctiva/sclera: Conjunctivae normal.  Neck:     Thyroid: No thyromegaly.  Cardiovascular:     Rate and Rhythm: Tachycardia present. Rhythm irregular.     Heart sounds: No murmur heard. No friction rub. No gallop.   Pulmonary:     Breath sounds: No stridor. No wheezing or rales.  Chest:     Chest wall: No tenderness.  Abdominal:     General: There is no distension.     Tenderness: There is no abdominal tenderness. There is no rebound.  Musculoskeletal:        General: Normal range of motion.     Cervical back: Neck supple.     Comments: Swelling in the legs  Lymphadenopathy:     Cervical: No cervical adenopathy.  Skin:    Findings: No erythema or rash.  Neurological:     Mental Status: He is alert and oriented to person, place, and time.     Motor: No abnormal muscle tone.     Coordination: Coordination normal.  Psychiatric:        Behavior: Behavior normal.     ED Results / Procedures / Treatments   Labs (all labs ordered are listed, but only abnormal results are displayed) Labs Reviewed  CBC WITH DIFFERENTIAL/PLATELET - Abnormal; Notable for the following components:      Result Value   MCV 75.4 (*)    MCH 23.2 (*)    RDW 18.0 (*)    nRBC 1.4 (*)    Monocytes Absolute 1.1 (*)    All other components within normal limits  COMPREHENSIVE METABOLIC PANEL - Abnormal; Notable for the following components:   Glucose, Bld 119 (*)    BUN 43 (*)    Creatinine, Ser 1.75 (*)    Total Bilirubin 2.4 (*)    GFR, Estimated 42 (*)    All other components within normal limits  BRAIN NATRIURETIC PEPTIDE - Abnormal; Notable for the following  components:   B Natriuretic Peptide 1,158.5 (*)    All other components within normal limits  TROPONIN I (HIGH SENSITIVITY) - Abnormal; Notable for the following components:   Troponin I (High Sensitivity) 28 (*)    All other components within normal limits  TROPONIN I (HIGH SENSITIVITY) - Abnormal; Notable for the following components:   Troponin I (High Sensitivity) 30 (*)    All other components within normal limits  SARS CORONAVIRUS 2 (TAT 6-24 HRS)  LACTIC ACID, PLASMA  LACTIC ACID, PLASMA  HEMOGLOBIN A1C    EKG EKG Interpretation  Date/Time:  Monday Dec 02 2020 08:52:02 EDT Ventricular Rate:  168 PR Interval:    QRS Duration: 94 QT Interval:  298 QTC Calculation: 499 R Axis:   20 Text Interpretation: Atrial fibrillation with rapid V-rate Low voltage, extremity and precordial leads Consider anterior infarct Confirmed by Milton Ferguson [phone]) on 12/02/2020 9:18:46 AM   Radiology DG Chest Port 1 View  Result Date: 12/02/2020 CLINICAL DATA:  Shortness of breath EXAM: PORTABLE CHEST 1 VIEW COMPARISON:  September 2018 FINDINGS: Opacification of right lower and mid lung. Left lung is clear. No pneumothorax. Cardiomegaly. IMPRESSION: Opacification of right lower and mid lung likely reflecting combination of pleural effusion and atelectasis/consolidation. Cardiomegaly. Electronically Signed   By: Macy Mis M.D.   On: 12/02/2020 09:55    Procedures Procedures   Medications Ordered in ED Medications  diltiazem (CARDIZEM) 125 mg in dextrose 5% 125 mL (1 mg/mL) infusion (10 mg/hr Intravenous Rate/Dose Change  12/02/20 1018)  furosemide (LASIX) injection 80 mg (has no administration in time range)  diltiazem (CARDIZEM) injection 10 mg (10 mg Intravenous Given 12/02/20 0943)    ED Course  I have reviewed the triage vital signs and the nursing notes.  Pertinent labs & imaging results that were available during my care of the patient were reviewed by me and considered in my medical  decision making (see chart for details). CRITICAL CARE Performed by: Milton Ferguson Total critical care time: 40 minutes Critical care time was exclusive of separately billable procedures and treating other patients. Critical care was necessary to treat or prevent imminent or life-threatening deterioration. Critical care was time spent personally by me on the following activities: development of treatment plan with patient and/or surrogate as well as nursing, discussions with consultants, evaluation of patient's response to treatment, examination of patient, obtaining history from patient or surrogate, ordering and performing treatments and interventions, ordering and review of laboratory studies, ordering and review of radiographic studies, pulse oximetry and re-evaluation of patient's condition.    MDM Rules/Calculators/A&P                          Patient in congestive heart failure and rapid atrial fib.  Cardizem drip controlled his rate.  He will be seen by cardiology and they will determine whether they will admit the patient or just consult Final Clinical Impression(s) / ED Diagnoses Final diagnoses:  None    Rx / DC Orders ED Discharge Orders    None       Milton Ferguson, MD 12/02/20 1439

## 2020-12-02 NOTE — ED Triage Notes (Signed)
Pt bib [facility] from home for shob x1 week. Pt presents shob on NRB at 12L sating 100. Pt has swelling in all extremities.  One nitro given with ems with no changes  EMS vitals: 110/70 83% RA 24 rr 100 HR 97.5

## 2020-12-02 NOTE — Progress Notes (Signed)
ANTICOAGULATION CONSULT NOTE - Initial Consult  Pharmacy Consult for heparin Indication: atrial fibrillation  No Known Allergies  Patient Measurements: Height: 6\' 1"  (185.4 cm) Weight: 127 kg (280 lb) IBW/kg (Calculated) : 79.9 Heparin Dosing Weight: 108 kg   Vital Signs: Temp: 97.7 F (36.5 C) (05/09 1259) Temp Source: Oral (05/09 1259) BP: 115/76 (05/09 1433) Pulse Rate: 99 (05/09 1433)  Labs: Recent Labs    12/02/20 0914 12/02/20 1115  HGB 13.1  --   HCT 42.6  --   PLT 255  --   CREATININE 1.75*  --   TROPONINIHS 28* 30*    Estimated Creatinine Clearance: 58 mL/min (A) (by C-G formula based on SCr of 1.75 mg/dL (H)).   Medical History: Past Medical History:  Diagnosis Date  . Asthma   . Chronic systolic CHF (congestive heart failure) (HCC)    a. 06/2015 Echo: EF 25-30%, mod-sev MR, PASP 07/2015. b. 08/2016: echo showing EF of 20-25% with diffuse HK  . Cocaine use   . Essential hypertension   . Hyperglycemia 09/2019  . Hyperlipidemia 09/2019  . Longstanding persistent atrial fibrillation (H[facility])    a. Initially dx 06/2015, paroxysmal at that time. b. Recurrent in Feb 2018 and beyond.  . Moderate mitral regurgitation    a. 06/2015 Echo: Mod-Sev MR. b. 08/2016: echo showing moderate MR.   09/2016 NICM (nonischemic cardiomyopathy) (HCC)    a. 06/2015 Echo: EF 25-30%, normal cors by cath - ? Tachy-mediated;    . Non-obstructive CAD    a. 06/2015 Cath: LM nl, LAD 2m, LCX nl, RCA nl, EF 25-30%.  . Tobacco use   . Vitamin D deficiency     Medications:  (Not in a hospital admission)   Assessment: 77 YOM with congestive heart failure presents in rapid atrial fibrillation. Of note, patient is on apixaban at home and her last dose was on 5/6  H/H and Plt wnl  Goal of Therapy:  Heparin level 0.3-0.7 units/ml Monitor platelets by anticoagulation protocol: Yes   Plan:  -Start heparin 4000 units IV bolus followed by heparin infusion at 1600 units/hr -F/u 6 hr HL and  aPTT to ensure apixaban has cleared  -Monitor daily HL, CBC and s/s of bleeding   69, PharmD., BCPS, BCCCP Clinical Pharmacist Please refer to [facility] for unit-specific pharmacist

## 2020-12-03 ENCOUNTER — Inpatient Hospital Stay (HOSPITAL_COMMUNITY): Payer: Medicare Other

## 2020-12-03 DIAGNOSIS — E1159 Type 2 diabetes mellitus with other circulatory complications: Secondary | ICD-10-CM | POA: Diagnosis not present

## 2020-12-03 DIAGNOSIS — N1832 Chronic kidney disease, stage 3b: Secondary | ICD-10-CM

## 2020-12-03 DIAGNOSIS — I480 Paroxysmal atrial fibrillation: Secondary | ICD-10-CM | POA: Diagnosis not present

## 2020-12-03 DIAGNOSIS — I361 Nonrheumatic tricuspid (valve) insufficiency: Secondary | ICD-10-CM

## 2020-12-03 DIAGNOSIS — R0609 Other forms of dyspnea: Secondary | ICD-10-CM

## 2020-12-03 DIAGNOSIS — I5043 Acute on chronic combined systolic (congestive) and diastolic (congestive) heart failure: Secondary | ICD-10-CM | POA: Diagnosis not present

## 2020-12-03 DIAGNOSIS — I340 Nonrheumatic mitral (valve) insufficiency: Secondary | ICD-10-CM

## 2020-12-03 DIAGNOSIS — R319 Hematuria, unspecified: Secondary | ICD-10-CM

## 2020-12-03 DIAGNOSIS — I152 Hypertension secondary to endocrine disorders: Secondary | ICD-10-CM

## 2020-12-03 LAB — CBC
HCT: 41.4 % (ref 39.0–52.0)
Hemoglobin: 12.4 g/dL — ABNORMAL LOW (ref 13.0–17.0)
MCH: 23 pg — ABNORMAL LOW (ref 26.0–34.0)
MCHC: 30 g/dL (ref 30.0–36.0)
MCV: 76.7 fL — ABNORMAL LOW (ref 80.0–100.0)
Platelets: 254 10*3/uL (ref 150–400)
RBC: 5.4 MIL/uL (ref 4.22–5.81)
RDW: 18.2 % — ABNORMAL HIGH (ref 11.5–15.5)
WBC: 9.4 10*3/uL (ref 4.0–10.5)
nRBC: 1.2 % — ABNORMAL HIGH (ref 0.0–0.2)

## 2020-12-03 LAB — BASIC METABOLIC PANEL
Anion gap: 10 (ref 5–15)
Anion gap: 6 (ref 5–15)
BUN: 52 mg/dL — ABNORMAL HIGH (ref 8–23)
BUN: 56 mg/dL — ABNORMAL HIGH (ref 8–23)
CO2: 26 mmol/L (ref 22–32)
CO2: 30 mmol/L (ref 22–32)
Calcium: 9.3 mg/dL (ref 8.9–10.3)
Calcium: 9.2 mg/dL (ref 8.9–10.3)
Chloride: 102 mmol/L (ref 98–111)
Chloride: 100 mmol/L (ref 98–111)
Creatinine, Ser: 1.77 mg/dL — ABNORMAL HIGH (ref 0.61–1.24)
Creatinine, Ser: 1.91 mg/dL — ABNORMAL HIGH (ref 0.61–1.24)
GFR, Estimated: 42 mL/min — ABNORMAL LOW (ref >60)
GFR, Estimated: 38 mL/min — ABNORMAL LOW (ref >60)
Glucose, Bld: 112 mg/dL — ABNORMAL HIGH (ref 70–99)
Glucose, Bld: 139 mg/dL — ABNORMAL HIGH (ref 70–99)
Potassium: 5.4 mmol/L — ABNORMAL HIGH (ref 3.5–5.1)
Potassium: 5.7 mmol/L — ABNORMAL HIGH (ref 3.5–5.1)
Sodium: 138 mmol/L (ref 135–145)
Sodium: 136 mmol/L (ref 135–145)

## 2020-12-03 LAB — GLUCOSE, CAPILLARY
Glucose-Capillary: 144 mg/dL — ABNORMAL HIGH (ref 70–99)
Glucose-Capillary: 148 mg/dL — ABNORMAL HIGH (ref 70–99)
Glucose-Capillary: 138 mg/dL — ABNORMAL HIGH (ref 70–99)
Glucose-Capillary: 125 mg/dL — ABNORMAL HIGH (ref 70–99)

## 2020-12-03 LAB — ECHOCARDIOGRAM COMPLETE
AR max vel: 2.21 cm2
AV Area VTI: 2.56 cm2
AV Area mean vel: 2.39 cm2
AV Mean grad: 3.7 mmHg
AV Peak grad: 7 mmHg
Ao pk vel: 1.32 m/s
Height: 73 in
S' Lateral: 5.2 cm
Weight: 4532.8 oz

## 2020-12-03 LAB — APTT: aPTT: 105 seconds — ABNORMAL HIGH (ref 24–36)

## 2020-12-03 LAB — HEPARIN LEVEL (UNFRACTIONATED): Heparin Unfractionated: 0.84 IU/mL — ABNORMAL HIGH (ref 0.30–0.70)

## 2020-12-03 MED ORDER — PERFLUTREN LIPID MICROSPHERE
1.0000 mL | INTRAVENOUS | Status: AC | PRN
  Administered 2020-12-03: 4 mL via INTRAVENOUS
  Filled 2020-12-03: qty 10

## 2020-12-03 MED ORDER — DILTIAZEM HCL ER COATED BEADS 180 MG PO CP24
360.0000 mg | ORAL_CAPSULE | Freq: Every day | ORAL | Status: DC
  Administered 2020-12-03: 360 mg via ORAL
  Filled 2020-12-03: qty 2

## 2020-12-03 MED ORDER — FUROSEMIDE 10 MG/ML IJ SOLN
120.0000 mg | Freq: Two times a day (BID) | INTRAVENOUS | Status: DC
  Administered 2020-12-03 – 2020-12-04 (×3): 120 mg via INTRAVENOUS
  Filled 2020-12-03: qty 10
  Filled 2020-12-03: qty 12
  Filled 2020-12-03: qty 10
  Filled 2020-12-03: qty 12

## 2020-12-03 NOTE — Progress Notes (Signed)
  Echocardiogram 2D Echocardiogram has been performed.  Kevin [name]  [name] 12/03/2020, 3:35 PM

## 2020-12-03 NOTE — Progress Notes (Signed)
   12/03/20 0011  Assess: MEWS Score  BP 96/69  ECG Heart Rate 86  Resp 12  Level of Consciousness Alert  SpO2 99 %  O2 Device Nasal Cannula  Patient Activity (if Appropriate) In chair  O2 Flow Rate (L/min) 3 L/min  Assess: MEWS Score  MEWS Temp 0  MEWS Systolic 1  MEWS Pulse 0  MEWS RR 1  MEWS LOC 0  MEWS Score 2  MEWS Score Color Yellow  Assess: if the MEWS score is Yellow or Red  Were vital signs taken at a resting state? Yes  Focused Assessment No change from prior assessment  Early Detection of Sepsis Score *See Row Information* Low  MEWS guidelines implemented *See Row Information* Yes  Treat  Pain Scale 0-10  Pain Score 0  Take Vital Signs  Increase Vital Sign Frequency  Yellow: Q 2hr X 2 then Q 4hr X 2, if remains yellow, continue Q 4hrs  Escalate  MEWS: Escalate Yellow: discuss with charge nurse/RN and consider discussing with provider and RRT  Notify: Charge Nurse/RN  Name of Charge Nurse/RN Notified Jequetta RN  Date Charge Nurse/RN Notified 12/03/20  Time Charge Nurse/RN Notified 0013  Notify: Provider  Provider Name/Title Nipp MD  Date Provider Notified 12/03/20  Time Provider Notified [phone]  Notification Type Page  Notification Reason Other (Comment) (soft BP)   Pt yellow MEWS due to BP. Cardizem paused. MD notified. Charge nurse notified.

## 2020-12-03 NOTE — Progress Notes (Signed)
ANTICOAGULATION CONSULT NOTE  Pharmacy Consult for heparin Indication: atrial fibrillation  No Known Allergies  Patient Measurements: Height: 6\' 1"  (185.4 cm) Weight: 128.5 kg (283 lb 4.8 oz) IBW/kg (Calculated) : 79.9 Heparin Dosing Weight: 108 kg   Vital Signs: Temp: 97.8 F (36.6 C) (05/10 1130) Temp Source: Oral (05/10 1130) BP: 98/68 (05/10 1150) Pulse Rate: 86 (05/10 1000)  Labs: Recent Labs    12/02/20 0914 12/02/20 1115 12/02/20 1742 12/02/20 2238 12/03/20 0415 12/03/20 0543 12/03/20 0934 12/03/20 1046  HGB 13.1  --   --   --  12.4*  --   --   --   HCT 42.6  --   --   --  41.4  --   --   --   PLT 255  --   --   --  254  --   --   --   APTT  --   --   --  97*  --   --  105*  --   LABPROT  --   --  16.3*  --   --   --   --   --   INR  --   --  1.3*  --   --   --   --   --   HEPARINUNFRC  --   --   --  1.06* 0.84*  --   --   --   CREATININE 1.75*  --   --   --   --  1.77*  --  1.91*  TROPONINIHS 28* 30*  --   --   --   --   --   --     Estimated Creatinine Clearance: 53.4 mL/min (A) (by C-G formula based on SCr of 1.91 mg/dL (H)).  Assessment: 65 y.o. male with h/o Afib, Eliquis on hold, continues on heparin, PTT 105 seconds Planning DCCV 5/12  Goal of Therapy:  APTT 66-102 sec Heparin level 0.3-0.7 units/ml Monitor platelets by anticoagulation protocol: Yes   Plan:  Continue Heparin at current rate  Follow-up am labs.  Thank you 7/12, PharmD

## 2020-12-03 NOTE — Progress Notes (Signed)
Heart Failure Nurse Navigator Progress Note  Navigation team following this hospitalization. Screening pending further testing/cardiac workup. Possible DCCV 5/12. Pending ECHO results.  Ozella Rocks, RN, BSN Heart Failure Nurse Navigator [phone]

## 2020-12-03 NOTE — Progress Notes (Addendum)
Entered in error- please see progress note from 12/03/20.

## 2020-12-03 NOTE — Progress Notes (Signed)
Progress Note  Patient Name: Kevin [name] Date of Encounter: 12/03/2020  Primary Cardiologist: Chrystie Nose, MD   Subjective   Overnight lactate 1.9 Patient notes a return to his cola colored urine.  No change in sx.  Did not receive his IV lasix order.  Inpatient Medications    Scheduled Meds: . atorvastatin  10 mg Oral Daily  . insulin aspart  0-5 Units Subcutaneous QHS  . insulin aspart  0-9 Units Subcutaneous TID WC  . loratadine  10 mg Oral Daily  . montelukast  10 mg Oral QHS  . sodium chloride flush  3 mL Intravenous Q12H   Continuous Infusions: . sodium chloride Stopped (12/02/20 2151)  . sodium chloride    . diltiazem (CARDIZEM) infusion 10 mg/hr (12/03/20 0142)  . furosemide    . heparin 1,600 Units/hr (12/03/20 0728)   PRN Meds: sodium chloride, acetaminophen, ondansetron (ZOFRAN) IV, sodium chloride flush   Vital Signs    Vitals:   12/03/20 0508 12/03/20 0616 12/03/20 0700 12/03/20 0800  BP:  93/67  124/88  Pulse:   100 (!) 101  Resp:    17  Temp:   97.6 F (36.4 C)   TempSrc:   Oral   SpO2:    95%  Weight: 128.5 kg     Height:        Intake/Output Summary (Last 24 hours) at 12/03/2020 0829 Last data filed at 12/03/2020 0716 Gross per 24 hour  Intake 410.64 ml  Output 200 ml  Net 210.64 ml   Filed Weights   12/02/20 0858 12/02/20 1726 12/03/20 0508  Weight: 127 kg 128.5 kg 128.5 kg    Telemetry    AF rate controled on IV Diltiazem - Personally Reviewed  ECG    No new - Personally Reviewed  Physical Exam   GEN: No acute distress.   Neck:  JVD to mid neck Cardiac: Irregularly irregular tachycardia with distant heart sounds  Respiratory: Bilateral Crackles GI: Soft, non-tenderly distended MS: +2-3 edema; No deformity. Neuro:  Nonfocal  Psych: Normal affect   Labs    Chemistry Recent Labs  Lab 12/02/20 0914 12/03/20 0543  NA 138 138  K 5.1 5.4*  CL 101 102  CO2 29 26  GLUCOSE 119* 112*  BUN 43* 52*  CREATININE  1.75* 1.77*  CALCIUM 9.3 9.3  PROT 7.3  --   ALBUMIN 3.6  --   AST 19  --   ALT 17  --   ALKPHOS 104  --   BILITOT 2.4*  --   GFRNONAA 42* 42*  ANIONGAP 8 10     Hematology Recent Labs  Lab 12/02/20 0914 12/03/20 0415  WBC 9.3 9.4  RBC 5.65 5.40  HGB 13.1 12.4*  HCT 42.6 41.4  MCV 75.4* 76.7*  MCH 23.2* 23.0*  MCHC 30.8 30.0  RDW 18.0* 18.2*  PLT 255 254    Cardiac EnzymesNo results for input(s): TROPONINI in the last 168 hours. No results for input(s): TROPIPOC in the last 168 hours.   BNP Recent Labs  Lab 12/02/20 0915  BNP 1,158.5*     DDimer No results for input(s): DDIMER in the last 168 hours.   Radiology    DG Chest Port 1 View  Result Date: 12/02/2020 CLINICAL DATA:  Shortness of breath EXAM: PORTABLE CHEST 1 VIEW COMPARISON:  September 2018 FINDINGS: Opacification of right lower and mid lung. Left lung is clear. No pneumothorax. Cardiomegaly. IMPRESSION: Opacification of right lower and mid lung likely  reflecting combination of pleural effusion and atelectasis/consolidation. Cardiomegaly. Electronically Signed   By: Guadlupe Spanish M.D.   On: 12/02/2020 09:55    Cardiac Studies   Pending Echo for today  Patient Profile     65 y.o. 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 Tobacco Abuse- discussed continued cessation after DC Hematuria - Lasix 120 mg IV BID and PM BMP; Mg from AM - Heparin Drip; if improvement in 12/04/20 will not plan for thoracentesis and transition to DOAC - IV Diltiazem to PO -> 360 PO XL Cardizem with plans to stop  IVcardizem - continue low dose atorvastatin for now - appreciate DM coordinator recs - TEE/DCCV to 12/05/20 - pending echo today  - if hematuria leads to decrease In CBC;  will abandon DCCV and plan for rate control strategy; at that time will call urology  Labs ordered PPX- Heparin Full Code Discharge Planning: Likely home after euvolemic (profoundly volume up and could reasonable need 5-7 days of aggressive diuresis)  For questions or updates, please contact [facility] Please consult [email or web address] for contact info under Cardiology/STEMI.      Signed, Christell Constant, MD  12/03/2020, 8:29 AM

## 2020-12-03 NOTE — Progress Notes (Signed)
ANTICOAGULATION CONSULT NOTE  Pharmacy Consult for heparin Indication: atrial fibrillation  No Known Allergies  Patient Measurements: Height: 6\' 1"  (185.4 cm) Weight: 128.5 kg (283 lb 4.7 oz) IBW/kg (Calculated) : 79.9 Heparin Dosing Weight: 108 kg   Vital Signs: Temp: 97.8 F (36.6 C) (05/09 2340) Temp Source: Oral (05/09 2340) BP: 98/82 (05/10 0029) Pulse Rate: 89 (05/09 2229)  Labs: Recent Labs    12/02/20 0914 12/02/20 1115 12/02/20 1742 12/02/20 2238  HGB 13.1  --   --   --   HCT 42.6  --   --   --   PLT 255  --   --   --   APTT  --   --   --  97*  LABPROT  --   --  16.3*  --   INR  --   --  1.3*  --   HEPARINUNFRC  --   --   --  1.06*  CREATININE 1.75*  --   --   --   TROPONINIHS 28* 30*  --   --     Estimated Creatinine Clearance: 58.3 mL/min (A) (by C-G formula based on SCr of 1.75 mg/dL (H)).  Assessment: 65 y.o. male with h/o Afib, Eliquis on hold, for heparin  Goal of Therapy:  APTT 66-102 sec Heparin level 0.3-0.7 units/ml Monitor platelets by anticoagulation protocol: Yes   Plan:  Continue Heparin at current rate  Follow-up am labs.  71, PharmD, BCPS

## 2020-12-04 ENCOUNTER — Inpatient Hospital Stay (HOSPITAL_COMMUNITY): Payer: Medicare Other

## 2020-12-04 ENCOUNTER — Inpatient Hospital Stay: Payer: Self-pay

## 2020-12-04 DIAGNOSIS — R319 Hematuria, unspecified: Secondary | ICD-10-CM | POA: Diagnosis not present

## 2020-12-04 DIAGNOSIS — I5043 Acute on chronic combined systolic (congestive) and diastolic (congestive) heart failure: Secondary | ICD-10-CM | POA: Diagnosis not present

## 2020-12-04 DIAGNOSIS — I4819 Other persistent atrial fibrillation: Secondary | ICD-10-CM | POA: Diagnosis not present

## 2020-12-04 DIAGNOSIS — N179 Acute kidney failure, unspecified: Secondary | ICD-10-CM | POA: Diagnosis not present

## 2020-12-04 LAB — BASIC METABOLIC PANEL
Anion gap: 9 (ref 5–15)
Anion gap: 8 (ref 5–15)
BUN: 63 mg/dL — ABNORMAL HIGH (ref 8–23)
BUN: 68 mg/dL — ABNORMAL HIGH (ref 8–23)
CO2: 31 mmol/L (ref 22–32)
CO2: 31 mmol/L (ref 22–32)
Calcium: 9.1 mg/dL (ref 8.9–10.3)
Calcium: 8.5 mg/dL — ABNORMAL LOW (ref 8.9–10.3)
Chloride: 97 mmol/L — ABNORMAL LOW (ref 98–111)
Chloride: 95 mmol/L — ABNORMAL LOW (ref 98–111)
Creatinine, Ser: 2.35 mg/dL — ABNORMAL HIGH (ref 0.61–1.24)
Creatinine, Ser: 3.04 mg/dL — ABNORMAL HIGH (ref 0.61–1.24)
GFR, Estimated: 30 mL/min — ABNORMAL LOW (ref >60)
GFR, Estimated: 22 mL/min — ABNORMAL LOW (ref >60)
Glucose, Bld: 91 mg/dL (ref 70–99)
Glucose, Bld: 267 mg/dL — ABNORMAL HIGH (ref 70–99)
Potassium: 5.6 mmol/L — ABNORMAL HIGH (ref 3.5–5.1)
Potassium: 4.6 mmol/L (ref 3.5–5.1)
Sodium: 137 mmol/L (ref 135–145)
Sodium: 134 mmol/L — ABNORMAL LOW (ref 135–145)

## 2020-12-04 LAB — RAPID URINE DRUG SCREEN, HOSP PERFORMED
Amphetamines: NOT DETECTED
Barbiturates: NOT DETECTED
Benzodiazepines: NOT DETECTED
Cocaine: NOT DETECTED
Opiates: NOT DETECTED
Tetrahydrocannabinol: NOT DETECTED

## 2020-12-04 LAB — GLUCOSE, CAPILLARY
Glucose-Capillary: 93 mg/dL (ref 70–99)
Glucose-Capillary: 169 mg/dL — ABNORMAL HIGH (ref 70–99)
Glucose-Capillary: 117 mg/dL — ABNORMAL HIGH (ref 70–99)
Glucose-Capillary: 136 mg/dL — ABNORMAL HIGH (ref 70–99)

## 2020-12-04 LAB — APTT
aPTT: 141 seconds — ABNORMAL HIGH (ref 24–36)
aPTT: 132 seconds — ABNORMAL HIGH (ref 24–36)

## 2020-12-04 LAB — URINALYSIS, ROUTINE W REFLEX MICROSCOPIC
Bilirubin Urine: NEGATIVE
Glucose, UA: NEGATIVE mg/dL
Ketones, ur: NEGATIVE mg/dL
Nitrite: NEGATIVE
Protein, ur: NEGATIVE mg/dL
RBC / HPF: >50 RBC/hpf — ABNORMAL HIGH (ref 0–5)
Specific Gravity, Urine: 1.01 (ref 1.005–1.030)
pH: 5 (ref 5.0–8.0)

## 2020-12-04 LAB — CBC
HCT: 37.8 % — ABNORMAL LOW (ref 39.0–52.0)
Hemoglobin: 11.3 g/dL — ABNORMAL LOW (ref 13.0–17.0)
MCH: 23.1 pg — ABNORMAL LOW (ref 26.0–34.0)
MCHC: 29.9 g/dL — ABNORMAL LOW (ref 30.0–36.0)
MCV: 77.1 fL — ABNORMAL LOW (ref 80.0–100.0)
Platelets: 231 10*3/uL (ref 150–400)
RBC: 4.9 MIL/uL (ref 4.22–5.81)
RDW: 17.8 % — ABNORMAL HIGH (ref 11.5–15.5)
WBC: 9.9 10*3/uL (ref 4.0–10.5)
nRBC: 1.2 % — ABNORMAL HIGH (ref 0.0–0.2)

## 2020-12-04 LAB — HEPARIN LEVEL (UNFRACTIONATED)
Heparin Unfractionated: 0.96 IU/mL — ABNORMAL HIGH (ref 0.30–0.70)
Heparin Unfractionated: 0.6 IU/mL (ref 0.30–0.70)

## 2020-12-04 LAB — COOXEMETRY PANEL
Carboxyhemoglobin: 1.1 % (ref 0.5–1.5)
Methemoglobin: 1.3 % (ref 0.0–1.5)
O2 Saturation: 62.7 %
Total hemoglobin: 11.5 g/dL — ABNORMAL LOW (ref 12.0–16.0)

## 2020-12-04 LAB — MAGNESIUM: Magnesium: 2.4 mg/dL (ref 1.7–2.4)

## 2020-12-04 MED ORDER — MILRINONE LACTATE IN DEXTROSE 20-5 MG/100ML-% IV SOLN
0.1250 ug/kg/min | INTRAVENOUS | Status: DC
  Administered 2020-12-04 – 2020-12-06 (×6): 0.25 ug/kg/min via INTRAVENOUS
  Administered 2020-12-07 – 2020-12-10 (×5): 0.125 ug/kg/min via INTRAVENOUS
  Filled 2020-12-04 (×11): qty 100

## 2020-12-04 MED ORDER — METOPROLOL TARTRATE 50 MG PO TABS: 75.0000 mg | ORAL_TABLET | Freq: Two times a day (BID) | ORAL | Status: DC

## 2020-12-04 MED ORDER — CHLORHEXIDINE GLUCONATE CLOTH 2 % EX PADS
6.0000 | MEDICATED_PAD | Freq: Every day | CUTANEOUS | Status: DC
  Administered 2020-12-04 – 2020-12-21 (×17): 6 via TOPICAL

## 2020-12-04 MED ORDER — AMIODARONE LOAD VIA INFUSION
150.0000 mg | Freq: Once | INTRAVENOUS | Status: AC
  Administered 2020-12-04: 150 mg via INTRAVENOUS
  Filled 2020-12-04: qty 83.34

## 2020-12-04 MED ORDER — SODIUM CHLORIDE 0.9% FLUSH
10.0000 mL | Freq: Two times a day (BID) | INTRAVENOUS | Status: DC
  Administered 2020-12-04: 10 mL
  Administered 2020-12-04: 30 mL
  Administered 2020-12-05 – 2020-12-16 (×16): 10 mL
  Administered 2020-12-17: 20 mL
  Administered 2020-12-18 – 2020-12-21 (×5): 10 mL

## 2020-12-04 MED ORDER — FUROSEMIDE 10 MG/ML IJ SOLN
15.0000 mg/h | INTRAVENOUS | Status: DC
  Administered 2020-12-04: 10 mg/h via INTRAVENOUS
  Administered 2020-12-05: 15 mg/h via INTRAVENOUS
  Administered 2020-12-05: 10 mg/h via INTRAVENOUS
  Filled 2020-12-04 (×4): qty 20

## 2020-12-04 MED ORDER — AMIODARONE HCL IN DEXTROSE 360-4.14 MG/200ML-% IV SOLN
INTRAVENOUS | Status: AC
  Administered 2020-12-04: 60 mg/h via INTRAVENOUS
  Filled 2020-12-04: qty 200

## 2020-12-04 MED ORDER — AMIODARONE HCL IN DEXTROSE 360-4.14 MG/200ML-% IV SOLN
60.0000 mg/h | INTRAVENOUS | Status: AC
  Filled 2020-12-04: qty 200

## 2020-12-04 MED ORDER — SODIUM ZIRCONIUM CYCLOSILICATE 10 G PO PACK
10.0000 g | PACK | Freq: Once | ORAL | Status: AC
  Administered 2020-12-04: 10 g via ORAL
  Filled 2020-12-04: qty 1

## 2020-12-04 MED ORDER — HEPARIN (PORCINE) 25000 UT/250ML-% IV SOLN
1300.0000 [IU]/h | INTRAVENOUS | Status: DC
  Administered 2020-12-04 – 2020-12-15 (×16): 1350 [IU]/h via INTRAVENOUS
  Administered 2020-12-15: 1300 [IU]/h via INTRAVENOUS
  Filled 2020-12-04 (×16): qty 250

## 2020-12-04 MED ORDER — METOPROLOL TARTRATE 50 MG PO TABS: 50.0000 mg | ORAL_TABLET | Freq: Two times a day (BID) | ORAL | Status: DC

## 2020-12-04 MED ORDER — AMIODARONE HCL IN DEXTROSE 360-4.14 MG/200ML-% IV SOLN
60.0000 mg/h | INTRAVENOUS | Status: DC
  Administered 2020-12-04 (×2): 30 mg/h via INTRAVENOUS
  Administered 2020-12-05 – 2020-12-09 (×19): 60 mg/h via INTRAVENOUS
  Administered 2020-12-10 (×2): 30 mg/h via INTRAVENOUS
  Administered 2020-12-10: 36 mg/h via INTRAVENOUS
  Administered 2020-12-11 (×2): 30 mg/h via INTRAVENOUS
  Administered 2020-12-12 – 2020-12-13 (×4): 60 mg/h via INTRAVENOUS
  Administered 2020-12-13 – 2020-12-14 (×2): 30 mg/h via INTRAVENOUS
  Administered 2020-12-14: 60 mg/h via INTRAVENOUS
  Administered 2020-12-14 – 2020-12-15 (×4): 30 mg/h via INTRAVENOUS
  Administered 2020-12-16: 60 mg/h via INTRAVENOUS
  Administered 2020-12-16: 30 mg/h via INTRAVENOUS
  Administered 2020-12-16: 60 mg/h via INTRAVENOUS
  Administered 2020-12-17: 30 mg/h via INTRAVENOUS
  Administered 2020-12-17 – 2020-12-18 (×3): 60 mg/h via INTRAVENOUS
  Administered 2020-12-18: 30 mg/h via INTRAVENOUS
  Administered 2020-12-19: 60 mg/h via INTRAVENOUS
  Filled 2020-12-04 (×45): qty 200

## 2020-12-04 MED ORDER — SODIUM CHLORIDE 0.9% FLUSH: 10.0000 mL | INTRAVENOUS | Status: DC | PRN

## 2020-12-04 NOTE — Progress Notes (Signed)
ANTICOAGULATION CONSULT NOTE  Pharmacy Consult for heparin Indication: atrial fibrillation  No Known Allergies  Patient Measurements: Height: 6\' 1"  (185.4 cm) Weight: 131.6 kg (290 lb 2 oz) IBW/kg (Calculated) : 79.9 Heparin Dosing Weight: 108 kg   Vital Signs: Temp: 98 F (36.7 C) (05/11 0805) Temp Source: Oral (05/11 0803) BP: 131/95 (05/11 0824) Pulse Rate: 102 (05/11 0805)  Labs: Recent Labs    12/02/20 0914 12/02/20 1115 12/02/20 1742 12/02/20 2238 12/03/20 0415 12/03/20 0543 12/03/20 0934 12/03/20 1046 12/04/20 0305  HGB 13.1  --   --   --  12.4*  --   --   --  11.3*  HCT 42.6  --   --   --  41.4  --   --   --  37.8*  PLT 255  --   --   --  254  --   --   --  231  APTT  --   --   --  97*  --   --  105*  --  141*  LABPROT  --   --  16.3*  --   --   --   --   --   --   INR  --   --  1.3*  --   --   --   --   --   --   HEPARINUNFRC  --   --   --  1.06* 0.84*  --   --   --  0.96*  CREATININE 1.75*  --   --   --   --  1.77*  --  1.91* 2.35*  TROPONINIHS 28* 30*  --   --   --   --   --   --   --     Estimated Creatinine Clearance: 44 mL/min (A) (by C-G formula based on SCr of 2.35 mg/dL (H)).  Assessment: 65 year old male continues on heparin for Afib Heparin off this morning for hematuria - now to resume  Goal of Therapy:  APTT 66-102 sec Heparin level 0.3-0.7 units/ml Monitor platelets by anticoagulation protocol: Yes   Plan:  Heparin at 1350 units / hr APTT in 8 hours   Thank you 76, PharmD

## 2020-12-04 NOTE — Progress Notes (Signed)
Progress Note  Patient Name: Kevin [name] Date of Encounter: 12/04/2020  Primary Cardiologist: Chrystie Nose, MD   Subjective   Hemoglobin 13->12-> 11.  Notes that his lasix isn't working the way is usually does.  Still notes hematuria.  Inpatient Medications    Scheduled Meds: . atorvastatin  10 mg Oral Daily  . diltiazem  360 mg Oral Daily  . insulin aspart  0-5 Units Subcutaneous QHS  . insulin aspart  0-9 Units Subcutaneous TID WC  . loratadine  10 mg Oral Daily  . montelukast  10 mg Oral QHS  . sodium chloride flush  3 mL Intravenous Q12H   Continuous Infusions: . sodium chloride Stopped (12/02/20 2151)  . sodium chloride    . furosemide Stopped (12/03/20 2000)  . heparin 1,350 Units/hr (12/04/20 3557)   PRN Meds: sodium chloride, acetaminophen, ondansetron (ZOFRAN) IV, sodium chloride flush   Vital Signs    Vitals:   12/03/20 2336 12/04/20 0416 12/04/20 0505 12/04/20 0614  BP: (!) 129/97  95/74   Pulse: 60 94    Resp: 20 11 14    Temp: 97.7 F (36.5 C) 97.8 F (36.6 C)    TempSrc: Oral Oral    SpO2: 98% 97% 99%   Weight:    131.6 kg  Height:        Intake/Output Summary (Last 24 hours) at 12/04/2020 0739 Last data filed at 12/04/2020 0331 Gross per 24 hour  Intake 541.91 ml  Output 250 ml  Net 291.91 ml   Filed Weights   12/02/20 1726 12/03/20 0508 12/04/20 0614  Weight: 128.5 kg 128.5 kg 131.6 kg    Telemetry    AF with reasonable rate control - Personally Reviewed  ECG    No new - Personally Reviewed  Physical Exam   GEN: No acute distress. Neck:  JVD to mid neck Cardiac: Irregularly irregular tachycardia with distant heart sounds Respiratory: Bilateral Crackles GI: Soft, non-tenderly distended MS: +2-3 edema; No deformity. Neuro:  Nonfocal  Psych: Normal affect   Labs    Chemistry Recent Labs  Lab 12/02/20 0914 12/03/20 0543 12/03/20 1046 12/04/20 0305  NA 138 138 136 137  K 5.1 5.4* 5.7* 5.6*  CL 101 102 100 97*   CO2 29 26 30 31   GLUCOSE 119* 112* 139* 91  BUN 43* 52* 56* 63*  CREATININE 1.75* 1.77* 1.91* 2.35*  CALCIUM 9.3 9.3 9.2 9.1  PROT 7.3  --   --   --   ALBUMIN 3.6  --   --   --   AST 19  --   --   --   ALT 17  --   --   --   ALKPHOS 104  --   --   --   BILITOT 2.4*  --   --   --   GFRNONAA 42* 42* 38* 30*  ANIONGAP 8 10 6 9      Hematology Recent Labs  Lab 12/02/20 0914 12/03/20 0415 12/04/20 0305  WBC 9.3 9.4 9.9  RBC 5.65 5.40 4.90  HGB 13.1 12.4* 11.3*  HCT 42.6 41.4 37.8*  MCV 75.4* 76.7* 77.1*  MCH 23.2* 23.0* 23.1*  MCHC 30.8 30.0 29.9*  RDW 18.0* 18.2* 17.8*  PLT 255 254 231    Cardiac EnzymesNo results for input(s): TROPONINI in the last 168 hours. No results for input(s): TROPIPOC in the last 168 hours.   BNP Recent Labs  Lab 12/02/20 0915  BNP 1,158.5*     DDimer  No results for input(s): DDIMER in the last 168 hours.   Radiology    DG CHEST PORT 1 VIEW  Result Date: 12/03/2020 CLINICAL DATA:  Shortness of breath EXAM: PORTABLE CHEST 1 VIEW COMPARISON:  Dec 02, 2020 FINDINGS: There is a persistent right pleural effusion with suspected atelectasis/consolidation throughout much of the right middle and lower lobes. The left lung is clear. There is stable cardiomegaly with pulmonary vascularity within normal limits. No adenopathy. No bone lesions. IMPRESSION: Persistent right pleural effusion with suspected atelectasis/consolidation throughout much of the right middle and lower lobes. Left lung clear. Stable cardiac prominence. Electronically Signed   By: Bretta Bang III M.D.   On: 12/03/2020 11:09   DG Chest Port 1 View  Result Date: 12/02/2020 CLINICAL DATA:  Shortness of breath EXAM: PORTABLE CHEST 1 VIEW COMPARISON:  September 2018 FINDINGS: Opacification of right lower and mid lung. Left lung is clear. No pneumothorax. Cardiomegaly. IMPRESSION: Opacification of right lower and mid lung likely reflecting combination of pleural effusion and  atelectasis/consolidation. Cardiomegaly. Electronically Signed   By: Guadlupe Spanish M.D.   On: 12/02/2020 09:55   ECHOCARDIOGRAM COMPLETE  Result Date: 12/03/2020    ECHOCARDIOGRAM REPORT   Patient Name:   Kevin [name] Date of Exam: 12/03/2020 Medical Rec #:  [id number]    Height:       73.0 in Accession #:    [id number]   Weight:       283.3 lb Date of Birth:  1956/03/14    BSA:          2.495 m Patient Age:    64 years     BP:           106/64 mmHg Patient Gender: M            HR:           78 bpm. Exam Location:  Inpatient Procedure: 2D Echo, Cardiac Doppler and Color Doppler Indications:    Dyspnea  History:        Patient has prior history of Echocardiogram examinations, most                 recent 05/19/2018. CHF, Arrythmias:Atrial Fibrillation; Risk                 Factors:Former Smoker and Hypertension.  Sonographer:    Shirlean Kelly Referring Phys: [id number] Beatriz Stallion  Sonographer Comments: Suboptimal subcostal window. Image acquisition challenging due to patient body habitus and Echo performed with patient in sitting position. IMPRESSIONS  1. Left ventricular ejection fraction, by estimation, is 25 to 30%. The left ventricle has severely decreased function. The left ventricle demonstrates global hypokinesis. The left ventricular internal cavity size was moderately dilated. There is mild eccentric left ventricular hypertrophy. Left ventricular diastolic function could not be evaluated. There is the interventricular septum is flattened in diastole ('D' shaped left ventricle), consistent with right ventricular volume overload.  2. Right ventricular systolic function is moderately reduced. The right ventricular size is moderately enlarged. There is mildly elevated pulmonary artery systolic pressure.  3. Left atrial size was severely dilated.  4. Right atrial size was severely dilated.  5. Mitral insufficiency appears to be at least mild-to-moderate, probably worse, in the parasternal long axis  view. Very little mitral insufficiency is seen on the apical views. The mitral valve is normal in structure, albeit with annular dilation and some degree of tenting due to LV remodeling.  6. Tricuspid valve regurgitation is moderate to  severe.  7. The aortic valve is tricuspid. Aortic valve regurgitation is not visualized. No aortic stenosis is present.  8. There is borderline dilatation of the ascending aorta, measuring 38 mm.  9. The inferior vena cava is dilated in size with <50% respiratory variability, suggesting right atrial pressure of 15 mmHg. Comparison(s): Prior images reviewed side by side. The left ventricular function is unchanged. The right ventricular systolic function is significantly worse. Mitral insufficiency is worse, but not optimally evaluated. Tricuspid regurgitation is much worse. Conclusion(s)/Recommendation(s): If clinically there appears to be significant mitral insufficiency, consider TEE. However, the mitral insufficency is most likely secondary to the cardiomyopathy and a repeat transthoracic echo after heart failure optimization is also reasonable. FINDINGS  Left Ventricle: Left ventricular ejection fraction, by estimation, is 25 to 30%. The left ventricle has severely decreased function. The left ventricle demonstrates global hypokinesis. The left ventricular internal cavity size was moderately dilated. There is mild eccentric left ventricular hypertrophy. The interventricular septum is flattened in diastole ('D' shaped left ventricle), consistent with right ventricular volume overload. Left ventricular diastolic function could not be evaluated due to atrial fibrillation. Left ventricular diastolic function could not be evaluated. Right Ventricle: The right ventricular size is moderately enlarged. No increase in right ventricular wall thickness. Right ventricular systolic function is moderately reduced. There is mildly elevated pulmonary artery systolic pressure. The tricuspid  regurgitant velocity is 2.67 m/s, and with an assumed right atrial pressure of 15 mmHg, the estimated right ventricular systolic pressure is 43.5 mmHg. Left Atrium: Left atrial size was severely dilated. Right Atrium: Right atrial size was severely dilated. Pericardium: There is no evidence of pericardial effusion. Mitral Valve: Mitral insufficiency appears to be at least mild-to-moderate, probably worse, in the parasternal long axis view. Very little mitral insufficiency is seen on the apical views. The mitral valve is normal in structure. Moderate mitral valve regurgitation. Tricuspid Valve: The tricuspid valve is grossly normal. Tricuspid valve regurgitation is moderate to severe. Aortic Valve: The aortic valve is tricuspid. Aortic valve regurgitation is not visualized. No aortic stenosis is present. Aortic valve mean gradient measures 3.7 mmHg. Aortic valve peak gradient measures 7.0 mmHg. Aortic valve area, by VTI measures 2.56 cm. Pulmonic Valve: The pulmonic valve was normal in structure. Pulmonic valve regurgitation is not visualized. Aorta: The aortic root is normal in size and structure. There is borderline dilatation of the ascending aorta, measuring 38 mm. Venous: The inferior vena cava is dilated in size with less than 50% respiratory variability, suggesting right atrial pressure of 15 mmHg. IAS/Shunts: No atrial level shunt detected by color flow Doppler.  LEFT VENTRICLE PLAX 2D LVIDd:         6.00 cm LVIDs:         5.20 cm LV PW:         1.20 cm LV IVS:        1.20 cm LVOT diam:     2.30 cm LV SV:         45 LV SV Index:   18 LVOT Area:     4.15 cm  RIGHT VENTRICLE            IVC RV S prime:     8.08 cm/s  IVC diam: 2.50 cm TAPSE (M-mode): 2.2 cm LEFT ATRIUM              Index       RIGHT ATRIUM           Index LA diam:  4.90 cm  1.96 cm/m  RA Area:     36.40 cm LA Vol (A2C):   200.0 ml 80.17 ml/m RA Volume:   140.00 ml 56.12 ml/m LA Vol (A4C):   117.0 ml 46.90 ml/m LA Biplane Vol: 153.0  ml 61.33 ml/m  AORTIC VALVE AV Area (Vmax):    2.21 cm AV Area (Vmean):   2.39 cm AV Area (VTI):     2.56 cm AV Vmax:           132.00 cm/s AV Vmean:          84.367 cm/s AV VTI:            0.177 m AV Peak Grad:      7.0 mmHg AV Mean Grad:      3.7 mmHg LVOT Vmax:         70.30 cm/s LVOT Vmean:        48.433 cm/s LVOT VTI:          0.109 m LVOT/AV VTI ratio: 0.62  AORTA Ao Root diam: 3.10 cm Ao Asc diam:  3.80 cm TRICUSPID VALVE TR Peak grad:   28.5 mmHg TR Vmax:        267.00 cm/s  SHUNTS Systemic VTI:  0.11 m Systemic Diam: 2.30 cm Rachelle Hora Croitoru MD Electronically signed by Thurmon Fair MD Signature Date/Time: 12/03/2020/4:38:44 PM    Final     Cardiac Studies   Transthoracic Echocardiogram: Date: 12/03/20 Results: Biventricular failure: Biatrial Dilation 1. Left ventricular ejection fraction, by estimation, is 25 to 30%. The  left ventricle has severely decreased function. The left ventricle  demonstrates global hypokinesis. The left ventricular internal cavity size  was moderately dilated. There is mild  eccentric left ventricular hypertrophy. Left ventricular diastolic  function could not be evaluated. There is the interventricular septum is  flattened in diastole ('D' shaped left ventricle), consistent with right  ventricular volume overload.  2. Right ventricular systolic function is moderately reduced. The right  ventricular size is moderately enlarged. There is mildly elevated  pulmonary artery systolic pressure.  3. Left atrial size was severely dilated.  4. Right atrial size was severely dilated.  5. Mitral insufficiency appears to be at least mild-to-moderate, probably  worse, in the parasternal long axis view. Very little mitral insufficiency  is seen on the apical views. The mitral valve is normal in structure,  albeit with annular dilation and  some degree of tenting due to LV remodeling.  6. Tricuspid valve regurgitation is moderate to severe.  7. The aortic  valve is tricuspid. Aortic valve regurgitation is not  visualized. No aortic stenosis is present.  8. There is borderline dilatation of the ascending aorta, measuring 38  mm.  9. The inferior vena cava is dilated in size with <50% respiratory  variability, suggesting right atrial pressure of 15 mmHg.   Left/Right Heart Catheterizations: Date: 07/26/2015 Results:  Mild nonobstructive CAD, most notably in the LAD.  There is severe left ventricular systolic dysfunction.   Continue aggressive medical therapy to treat his nonischemic cardiomyopathy.    Patient Profile     65 y.o. male with history of Morbid Obesity, Atrial Moderate MR, PAF, COPD NOS, Tobacco Abuse, and DM (last A1c 14) who presents with CHF   Assessment & Plan   Decompensated HFrEF vs beginnings of cardiogenic shock - NYHA class IV, Stage D, hypervolemic, etiology from AF Suspect Persistent Atrial Fibrillation  CHADSVASC 5 Morbid Obesity HTN and DM Non-obstructive CAD and HLD (resending FLP  for 12/05/20) AKI on CKD Stage IIIb Tobacco Abuse Hematuria (hgb 13->11 on heparin over two days; urinalysis pending) - Lasix 120 mg IV Continued - Concerned patient is in a low flow state - heparin temporarily stopped (now restarted) given drop in hemoglobin with 13-12-> 11  - urology called urology for consultation nursing knows to save urine, UA pending; and getting renal US -Diltiazem 360 PO XL Cardizem-> amiodarone per AHF - PICC and Venous CoOx ordered - consulting AHF appreciate recs - continue low dose atorvastatin - appreciate DM coordinator recs - Pushing TEE/DCCV until urology discussion  Labs ordered PPX- SCDs Full Code Discharge Planning: Patient likely will have significant stay int he setting of low flow state  Discussed with patient, nursing, AHF, and urology  CRITICAL CARE Performed by: Leshay Desaulniers A Ellarae Nevitt   Total critical care time: 45 minutes  Critical care time was exclusive of separately  billable procedures and treating other patients.  Critical care was necessary to treat or prevent imminent or life-threatening deterioration.  Critical care was time spent personally by me on the following activities: development of treatment plan with patient and/or surrogate as well as nursing, discussions with consultants, evaluation of patient's response to treatment, examination of patient, obtaining history from patient or surrogate, ordering and performing treatments and interventions, ordering and review of laboratory studies, ordering and review of radiographic studies, pulse oximetry and re-evaluation of patient's condition.   For questions or updates, please contact [facility] Please consult [email or web address] for contact info under Cardiology/STEMI.      Signed, Christell ConstantMahesh A Brennyn Ortlieb, MD  12/04/2020, 7:39 AM

## 2020-12-04 NOTE — Progress Notes (Signed)
Patient initiated on milrinone, amiodarone and heparin continuous drips. BP is tolerating with systolic 82-84. Vascular team at bedside for PICC line placement. Will initiate lasix drip post procedure if BP continues to tolerate parameters.

## 2020-12-04 NOTE — Progress Notes (Signed)
Peripherally Inserted Central Catheter Placement  The IV Nurse has discussed with the patient and/or persons authorized to consent for the patient, the purpose of this procedure and the potential benefits and risks involved with this procedure.  The benefits include less needle sticks, lab draws from the catheter, and the patient may be discharged home with the catheter. Risks include, but not limited to, infection, bleeding, blood clot (thrombus formation), and puncture of an artery; nerve damage and irregular heartbeat and possibility to perform a PICC exchange if needed/ordered by physician.  Alternatives to this procedure were also discussed.  Bard Power PICC patient education guide, fact sheet on infection prevention and patient information card has been provided to patient /or left at bedside.    PICC Placement Documentation  PICC Double Lumen 12/04/20 PICC Right Basilic 44 cm 0 cm (Active)  Indication for Insertion or Continuance of Line Vasoactive infusions 12/04/20 1133  Exposed Catheter (cm) 0 cm 12/04/20 1133  Site Assessment Clean;Dry;Intact 12/04/20 1133  Lumen #1 Status Flushed;Blood return noted 12/04/20 1133  Lumen #2 Status Flushed;Blood return noted 12/04/20 1133  Dressing Type Transparent 12/04/20 1133  Dressing Status Clean;Dry;Intact 12/04/20 1133  Antimicrobial disc in place? Yes 12/04/20 1133  Dressing Intervention New dressing;Other (Comment) 12/04/20 1133  Dressing Change Due 12/11/20 12/04/20 1133       [name] 12/04/2020, 11:34 AM

## 2020-12-04 NOTE — Consult Note (Signed)
Advanced Heart Failure Team Consult Note   Primary Physician: Azzie Glatter, FNP (Inactive) PCP-Cardiologist:  Pixie Casino, MD  Reason for Consultation: CHF  HPI:    Kevin [name] is seen today for evaluation of CHF at the request of Dr. Gasper Sells.   Patient was found to have a cardiomyopathy back in 2016, echo showed EF 25-30%.  At the time, cath showed 40% proximal LAD stenosis. He was noted at the time to have paroxysmal atrial fibrillation but it was not sustained.  In 2018, he was admitted with afib/RVR, echo with EF 20-25%.  He had DCCV back to NSR.  ICD was recommended but he declined.  Subsequently, echo in 2019 showed EF up to 55-60%.  He had been seen as an outpatient by Dr. Debara Pickett but was lost to followup for about a year.    He reports 1-2 months of steadily worsening exertional dyspnea and lower extremity edema.  He thinks that his weight is up about 30 lbs.  He has orthopnea and PND episodes.  He was short of breath with most exertion by the time of admission. He denies palpitations.   He has had episodic hematuria over the last year.  Has had dark urine this admission.  Hgb 11.3 today (mildly lower).    At admission, patient was noted to be in atrial fibrillation with RVR and volume overloaded. BNP 1150, HS-TnI 28=>30 (mild elevation, no trend). CXR with right pleural effusion.  No fever, normal WBCs.  He was started on Lasix with poor response. Creatinine 1.77 initially, increased to 2.35 since admission.  Echo was done showing EF 25-30%, moderate LV dilation, mild LVH, moderate RVE with moderately decreased RV systolic function, severe biatrial enlargement, moderate-severe TR, IVC dilated.   Review of Systems: All systems reviewed and negative except as per HPI.   Home Medications Prior to Admission medications   Medication Sig Start Date End Date Taking? Authorizing Provider  albuterol (PROVENTIL) (2.5 MG/3ML) 0.083% nebulizer solution Take 3 mLs (2.5 mg  total) by nebulization every 6 (six) hours as needed for wheezing or shortness of breath. Patient taking differently: Take 2.5 mg by nebulization See admin instructions. Nebulize 2.5 mg and inhale into the lungs up to six times a day as needed for shortness of breath or wheezing 04/19/20  Yes Azzie Glatter, FNP  albuterol (VENTOLIN HFA) 108 (90 Base) MCG/ACT inhaler Inhale 2 puffs into the lungs every 6 (six) hours as needed for wheezing or shortness of breath. Patient taking differently: Inhale 2 puffs into the lungs See admin instructions. Inhale 2 puffs into the lungs up to 10-12 times a day (max) as needed for shortness of breath or wheezing 04/19/20  Yes Azzie Glatter, FNP  apixaban (ELIQUIS) 5 MG TABS tablet Take 1 tablet (5 mg total) by mouth 2 (two) times daily. 04/19/20  Yes Azzie Glatter, FNP  atorvastatin (LIPITOR) 10 MG tablet Take 1 tablet (10 mg total) by mouth daily. 04/19/20  Yes Azzie Glatter, FNP  cetirizine (ZYRTEC) 10 MG tablet TAKE 1 TABLET BY MOUTH EVERY DAY Patient taking differently: Take 10 mg by mouth daily. 09/23/20  Yes Azzie Glatter, FNP  fluticasone (FLONASE) 50 MCG/ACT nasal spray SPRAY 2 SPRAYS INTO EACH NOSTRIL EVERY DAY Patient taking differently: Place 1 spray into both nostrils See admin instructions. Instill 1 spray into each nostril up to six times a day as needed for congestion 05/20/20  Yes Azzie Glatter, FNP  furosemide (LASIX)  40 MG tablet Take 1 tablet (40 mg total) by mouth 2 (two) times daily. Patient taking differently: Take 80 mg by mouth in the morning. 04/19/20  Yes Azzie Glatter, FNP  glipiZIDE (GLUCOTROL) 10 MG tablet Take 1 tablet (10 mg total) by mouth 2 (two) times daily before a meal. 09/25/20  Yes Azzie Glatter, FNP  lansoprazole (PREVACID) 30 MG capsule Take 1 capsule (30 mg total) by mouth daily as needed (for heartburn/indigestion). 09/25/20  Yes Azzie Glatter, FNP  metFORMIN (GLUCOPHAGE) 1000 MG tablet Take 1 tablet  (1,000 mg total) by mouth 2 (two) times daily with a meal. 09/25/20  Yes Azzie Glatter, FNP  montelukast (SINGULAIR) 10 MG tablet Take 1 tablet (10 mg total) by mouth at bedtime. 09/25/20  Yes Azzie Glatter, FNP  Multiple Vitamins-Minerals (ADULT ONE DAILY GUMMIES) CHEW Chew 1-2 tablets by mouth daily.   Yes [provider]  naproxen sodium (ALEVE) 220 MG tablet Take 220-440 mg by mouth daily as needed (for pain).   Yes [provider]  OVER THE COUNTER MEDICATION Place 1-2 drops into both eyes See admin instructions. [place] Maximum Redness Relief Cooling Eye Drops- Place 1-2 drops into both eyes up to 4 times a day as needed for irritation   Yes [provider]  sacubitril-valsartan (ENTRESTO) 49-51 MG Take 1 tablet by mouth 2 (two) times daily. 04/19/20  Yes Azzie Glatter, FNP  sitaGLIPtin (JANUVIA) 25 MG tablet Take 1 tablet (25 mg total) by mouth daily. 09/25/20  Yes Azzie Glatter, FNP  ACCU-CHEK GUIDE test strip TEST UP TO 4 TIMES A DAY AS DIRECTED Patient taking differently: 1 each See admin instructions. TEST UP TO 4 TIMES A DAY AS DIRECTED 09/03/20   Azzie Glatter, FNP  Accu-Chek Softclix Lancets lancets USE UP TO 4 TIMES A DAY AS DIRECTED Patient taking differently: 1 each See admin instructions. USE UP TO 4 TIMES A DAY AS DIRECTED 10/29/20   Azzie Glatter, FNP  blood glucose meter kit and supplies Dispense based on patient and insurance preference. Use up to four times daily as directed. (FOR ICD-10 E10.9, E11.9). 04/19/20   Azzie Glatter, FNP  cyclobenzaprine (FLEXERIL) 10 MG tablet TAKE 1 TABLET BY MOUTH THREE TIMES A DAY AS NEEDED FOR MUSCLE SPASMS Patient not taking: Reported on 12/02/2020 10/10/19   Azzie Glatter, FNP  doxycycline (VIBRA-TABS) 100 MG tablet Take 1 tablet (100 mg total) by mouth 2 (two) times daily. Patient not taking: Reported on 12/02/2020 09/25/20   Azzie Glatter, FNP  metoprolol succinate (TOPROL-XL) 50 MG 24 hr tablet Take  2 tablets (154m) in the morning, 1 tablet (533m in the evening with or after meal. Patient not taking: No sig reported 01/18/18   CaConstance HawMD  spironolactone (ALDACTONE) 25 MG tablet TAKE 1/2 TO 1 (ONE-HALF TO ONE) TABLET BY MOUTH ONCE DAILY AS  DIRECTED. Please make appt for future refills. [phone] attempt. Patient not taking: Reported on 12/02/2020 02/15/19   CaConstance HawMD  Vitamin D, Ergocalciferol, (DRISDOL) 1.25 MG (50000 UNIT) CAPS capsule TAKE 1 CAPSULE (50,000 UNITS TOTAL) BY MOUTH EVERY 7 (SEVEN) DAYS. Patient not taking: Reported on 12/02/2020 03/04/20   StAzzie GlatterFNP    Past Medical History: 1. Chronic systolic CHF: Nonischemic cardiomyopathy based on LHC in 2018 with 40% LAD stenosis. Declined ICD in the past.  - Echo in 2016 with EF 25-30%.  - Echo (2/18): EF 20-25%,  moderate MR.  - Echo (10/19): EF 55-60% - Echo (5/22): EF 25-30%, moderate LV dilation, mild LVH, moderate RVE with moderately decreased RV systolic function, severe biatrial enlargement, moderate-severe TR, IVC dilated.  2. Atrial fibrillation: Paroxysmal.  - DCCV in 2018.  3. Hematuria 4. Remote cocaine.  5. Type 2 diabetes 6. Hyperlipidemia  Past Surgical History: Past Surgical History:  Procedure Laterality Date  . CARDIAC CATHETERIZATION N/A 07/26/2015   Procedure: Left Heart Cath and Coronary Angiography;  Surgeon: Jettie Booze, MD;  Location: [facility];  Service: Cardiovascular;  Laterality: N/A;  . CARDIOVERSION N/A 04/09/2017   Procedure: CARDIOVERSION;  Surgeon: Fay Records, MD;  Location: [facility];  Service: Cardiovascular;  Laterality: N/A;  . TEE WITHOUT CARDIOVERSION N/A 04/09/2017   Procedure: TRANSESOPHAGEAL ECHOCARDIOGRAM (TEE);  Surgeon: Fay Records, MD;  Location: [facility];  Service: Cardiovascular;  Laterality: N/A;    Family History: Family History  Problem Relation Age of Onset  . Hypertension Father     Social  History: Social History   Socioeconomic History  . Marital status: Single    Spouse name: Not on file  . Number of children: Not on file  . Years of education: Not on file  . Highest education level: Not on file  Occupational History    Employer: [employer]  Tobacco Use  . Smoking status: Former Smoker    Packs/day: 0.50    Years: 40.00    Pack years: 20.00    Types: Cigarettes  . Smokeless tobacco: Never Used  . Tobacco comment: Quit in 2014  Vaping Use  . Vaping Use: Never used  Substance and Sexual Activity  . Alcohol use: Yes    Alcohol/week: 0.0 standard drinks    Comment: 2-3 drinks per week.  . Drug use: Yes    Comment: Not currently. Quit in 2014. 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  . Sexual activity: Not Currently  Other Topics Concern  . Not on file  Social History Narrative  . Not on file   Social Determinants of Health   Financial Resource Strain: Not on file  Food Insecurity: Not on file  Transportation Needs: Not on file  Physical Activity: Not on file  Stress: Not on file  Social Connections: Not on file    Allergies:  No Known Allergies  Objective:    Vital Signs:   Temp:  [97.6 F (36.4 C)-98 F (36.7 C)] 98 F (36.7 C) (05/11 0805) Pulse Rate:  [60-102] 102 (05/11 0805) Resp:  [11-20] 18 (05/11 0803) BP: (92-151)/(59-134) 131/95 (05/11 0824) SpO2:  [90 %-99 %] 90 % (05/11 0803) Weight:  [131.6 kg] 131.6 kg (05/11 0614) Last BM Date: 12/02/20  Weight change: Filed Weights   12/02/20 1726 12/03/20 0508 12/04/20 0614  Weight: 128.5 kg 128.5 kg 131.6 kg    Intake/Output:   Intake/Output Summary (Last 24 hours) at 12/04/2020 0925 Last data filed at 12/04/2020 0806 Gross per 24 hour  Intake 781.91 ml  Output 250 ml  Net 531.91 ml      Physical Exam    General:  Well appearing. No resp difficulty HEENT: normal Neck: supple. JVP 16+ cm. Carotids 2+ bilat; no bruits. No lymphadenopathy or thyromegaly appreciated. Cor: PMI  nondisplaced. Mildly tachy, irregular rate & rhythm. 1/6 HSM LLSB.  Lungs: clear Abdomen: soft, nontender, nondistended. No hepatosplenomegaly. No bruits or masses. Good bowel sounds. Extremities: no cyanosis, clubbing, rash. 2+ edema to thighs.  Neuro: alert & orientedx3, cranial nerves grossly  intact. moves all 4 extremities w/o difficulty. Affect pleasant   Telemetry   Atrial fibrillation rate 90s-100s (personally reviewed).   EKG    Atrial fibrillation with RVR (personally reviewed)  Labs   Basic Metabolic Panel: Recent Labs  Lab 12/02/20 0914 12/03/20 0543 12/03/20 1046 12/04/20 0305  NA 138 138 136 137  K 5.1 5.4* 5.7* 5.6*  CL 101 102 100 97*  CO2 29 26 30 31   GLUCOSE 119* 112* 139* 91  BUN 43* 52* 56* 63*  CREATININE 1.75* 1.77* 1.91* 2.35*  CALCIUM 9.3 9.3 9.2 9.1  MG  --   --   --  2.4    Liver Function Tests: Recent Labs  Lab 12/02/20 0914  AST 19  ALT 17  ALKPHOS 104  BILITOT 2.4*  PROT 7.3  ALBUMIN 3.6   No results for input(s): LIPASE, AMYLASE in the last 168 hours. No results for input(s): AMMONIA in the last 168 hours.  CBC: Recent Labs  Lab 12/02/20 0914 12/03/20 0415 12/04/20 0305  WBC 9.3 9.4 9.9  NEUTROABS 5.9  --   --   HGB 13.1 12.4* 11.3*  HCT 42.6 41.4 37.8*  MCV 75.4* 76.7* 77.1*  PLT 255 254 231    Cardiac Enzymes: No results for input(s): CKTOTAL, CKMB, CKMBINDEX, TROPONINI in the last 168 hours.  BNP: BNP (last 3 results) Recent Labs    12/02/20 0915  BNP 1,158.5*    ProBNP (last 3 results) No results for input(s): PROBNP in the last 8760 hours.   CBG: Recent Labs  Lab 12/03/20 0606 12/03/20 1129 12/03/20 1526 12/03/20 2117 12/04/20 0618  GLUCAP 144* 148* 138* 125* 93    Coagulation Studies: Recent Labs    12/02/20 1742  LABPROT 16.3*  INR 1.3*     Imaging   ECHOCARDIOGRAM COMPLETE  Result Date: 12/03/2020    ECHOCARDIOGRAM REPORT   Patient Name:   ELIGA ARVIE Date of Exam: 12/03/2020  Medical Rec #:  [id number]    Height:       73.0 in Accession #:    [id number]   Weight:       283.3 lb Date of Birth:  02-15-1956    BSA:          2.495 m Patient Age:    7 years     BP:           106/64 mmHg Patient Gender: M            HR:           78 bpm. Exam Location:  Inpatient Procedure: 2D Echo, Cardiac Doppler and Color Doppler Indications:    Dyspnea  History:        Patient has prior history of Echocardiogram examinations, most                 recent 05/19/2018. CHF, Arrythmias:Atrial Fibrillation; Risk                 Factors:Former Smoker and Hypertension.  Sonographer:    Cammy Brochure Referring Phys: [id number] Abigail Butts  Sonographer Comments: Suboptimal subcostal window. Image acquisition challenging due to patient body habitus and Echo performed with patient in sitting position. IMPRESSIONS  1. Left ventricular ejection fraction, by estimation, is 25 to 30%. The left ventricle has severely decreased function. The left ventricle demonstrates global hypokinesis. The left ventricular internal cavity size was moderately dilated. There is mild eccentric left ventricular hypertrophy. Left ventricular diastolic function could not be  evaluated. There is the interventricular septum is flattened in diastole ('D' shaped left ventricle), consistent with right ventricular volume overload.  2. Right ventricular systolic function is moderately reduced. The right ventricular size is moderately enlarged. There is mildly elevated pulmonary artery systolic pressure.  3. Left atrial size was severely dilated.  4. Right atrial size was severely dilated.  5. Mitral insufficiency appears to be at least mild-to-moderate, probably worse, in the parasternal long axis view. Very little mitral insufficiency is seen on the apical views. The mitral valve is normal in structure, albeit with annular dilation and some degree of tenting due to LV remodeling.  6. Tricuspid valve regurgitation is moderate to severe.  7. The  aortic valve is tricuspid. Aortic valve regurgitation is not visualized. No aortic stenosis is present.  8. There is borderline dilatation of the ascending aorta, measuring 38 mm.  9. The inferior vena cava is dilated in size with <50% respiratory variability, suggesting right atrial pressure of 15 mmHg. Comparison(s): Prior images reviewed side by side. The left ventricular function is unchanged. The right ventricular systolic function is significantly worse. Mitral insufficiency is worse, but not optimally evaluated. Tricuspid regurgitation is much worse. Conclusion(s)/Recommendation(s): If clinically there appears to be significant mitral insufficiency, consider TEE. However, the mitral insufficency is most likely secondary to the cardiomyopathy and a repeat transthoracic echo after heart failure optimization is also reasonable. FINDINGS  Left Ventricle: Left ventricular ejection fraction, by estimation, is 25 to 30%. The left ventricle has severely decreased function. The left ventricle demonstrates global hypokinesis. The left ventricular internal cavity size was moderately dilated. There is mild eccentric left ventricular hypertrophy. The interventricular septum is flattened in diastole ('D' shaped left ventricle), consistent with right ventricular volume overload. Left ventricular diastolic function could not be evaluated due to atrial fibrillation. Left ventricular diastolic function could not be evaluated. Right Ventricle: The right ventricular size is moderately enlarged. No increase in right ventricular wall thickness. Right ventricular systolic function is moderately reduced. There is mildly elevated pulmonary artery systolic pressure. The tricuspid regurgitant velocity is 2.67 m/s, and with an assumed right atrial pressure of 15 mmHg, the estimated right ventricular systolic pressure is 27.5 mmHg. Left Atrium: Left atrial size was severely dilated. Right Atrium: Right atrial size was severely dilated.  Pericardium: There is no evidence of pericardial effusion. Mitral Valve: Mitral insufficiency appears to be at least mild-to-moderate, probably worse, in the parasternal long axis view. Very little mitral insufficiency is seen on the apical views. The mitral valve is normal in structure. Moderate mitral valve regurgitation. Tricuspid Valve: The tricuspid valve is grossly normal. Tricuspid valve regurgitation is moderate to severe. Aortic Valve: The aortic valve is tricuspid. Aortic valve regurgitation is not visualized. No aortic stenosis is present. Aortic valve mean gradient measures 3.7 mmHg. Aortic valve peak gradient measures 7.0 mmHg. Aortic valve area, by VTI measures 2.56 cm. Pulmonic Valve: The pulmonic valve was normal in structure. Pulmonic valve regurgitation is not visualized. Aorta: The aortic root is normal in size and structure. There is borderline dilatation of the ascending aorta, measuring 38 mm. Venous: The inferior vena cava is dilated in size with less than 50% respiratory variability, suggesting right atrial pressure of 15 mmHg. IAS/Shunts: No atrial level shunt detected by color flow Doppler.  LEFT VENTRICLE PLAX 2D LVIDd:         6.00 cm LVIDs:         5.20 cm LV PW:  1.20 cm LV IVS:        1.20 cm LVOT diam:     2.30 cm LV SV:         45 LV SV Index:   18 LVOT Area:     4.15 cm  RIGHT VENTRICLE            IVC RV S prime:     8.08 cm/s  IVC diam: 2.50 cm TAPSE (M-mode): 2.2 cm LEFT ATRIUM              Index       RIGHT ATRIUM           Index LA diam:        4.90 cm  1.96 cm/m  RA Area:     36.40 cm LA Vol (A2C):   200.0 ml 80.17 ml/m RA Volume:   140.00 ml 56.12 ml/m LA Vol (A4C):   117.0 ml 46.90 ml/m LA Biplane Vol: 153.0 ml 61.33 ml/m  AORTIC VALVE AV Area (Vmax):    2.21 cm AV Area (Vmean):   2.39 cm AV Area (VTI):     2.56 cm AV Vmax:           132.00 cm/s AV Vmean:          84.367 cm/s AV VTI:            0.177 m AV Peak Grad:      7.0 mmHg AV Mean Grad:      3.7 mmHg  LVOT Vmax:         70.30 cm/s LVOT Vmean:        48.433 cm/s LVOT VTI:          0.109 m LVOT/AV VTI ratio: 0.62  AORTA Ao Root diam: 3.10 cm Ao Asc diam:  3.80 cm TRICUSPID VALVE TR Peak grad:   28.5 mmHg TR Vmax:        267.00 cm/s  SHUNTS Systemic VTI:  0.11 m Systemic Diam: 2.30 cm Dani Gobble Croitoru MD Electronically signed by Sanda Klein MD Signature Date/Time: 12/03/2020/4:38:44 PM    Final    [place] EKG SITE RITE  Result Date: 12/04/2020 If Site Rite image not attached, placement could not be confirmed due to current cardiac rhythm.     Medications:     Current Medications: . amiodarone  150 mg Intravenous Once  . atorvastatin  10 mg Oral Daily  . insulin aspart  0-5 Units Subcutaneous QHS  . insulin aspart  0-9 Units Subcutaneous TID WC  . loratadine  10 mg Oral Daily  . montelukast  10 mg Oral QHS  . sodium chloride flush  3 mL Intravenous Q12H  . sodium zirconium cyclosilicate  10 g Oral Once     Infusions: . sodium chloride Stopped (12/02/20 2151)  . sodium chloride    . amiodarone     Followed by  . amiodarone    . furosemide (LASIX) 200 mg in dextrose 5% 100 mL (59m/mL) infusion    . milrinone         Assessment/Plan   1. Acute on chronic systolic CHF: Nonischemic cardiomyopathy.  Echo this admission with EF 25-30%, moderate LV dilation, mild LVH, moderate RVE with moderately decreased RV systolic function, severe biatrial enlargement, moderate-severe TR, IVC dilated. EF was low starting in 2016, cath at that time showed mild nonobstructive CAD.  Patient had runs of AF but not sustained when initial diagnosis was made.  Was readmitted in 2018 with persistent AF  and CHF, echo with EF 20-25%.  Had DCCV back to NSR at that time.  EF up to 55-60% in 2019.  Lost to followup, now back with progressive dyspnea/volume overload and EF back down in setting of AF with RVR.  Difficult to identify initial etiology of CMP but cannot rule out tachy-mediated.  Current admission  certainly is concerning for tachy-mediated CMP.  He does not feel AF and we are not sure how long he has been in it.  He is 30+ lbs up with marked volume overload and concern for low output HF (progressive rise in creatinine with attempts at diuresis).  - Agree with PICC to follow CVP/co-ox (already ordered).   - Will need eventual RHC, consider repeat coronary angiography if creatinine improves significantly => HS-TnI slightly elevated with no trend so no ACS.  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  - Given concern for low output HF with rising creatinine, will empirically start him on milrinone 0.25 mcg/kg/min.  HR stable currently and will control with amiodarone.   - He is getting Lasix 120 mg IV bolus currently.  Has responded poorly so far.  Will start him on Lasix gtt 10 mg/hr after bolus, hopefully UOP will pick up on milrinone.  - Ultimately needs conversion to NSR but needs diuresis first.  - Consider eventual cardiac MRI.  - Check UDS given prior history of cocaine.  2. Atrial fibrillation: Admitted with afib/RVR, uncertain how long this has been present.  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  Needs conversion back to NSR. - Would like to see better diuresis before DCCV as he is markedly volume overloaded and less likely to hold NSR long-term.  - Start amiodarone gtt for rate control.  - Continue on heparin gtt for now, eventually transition to Eliquis.  - Stop po metoprolol with concern for low output, already off diltiazem.  3. AKI: Uncertain baseline, creatinine 1.77 at arrival, creatinine up to 2.35 with poor diuresis.  As above, concerned for low output. Marked volume overload.  - Starting milrinone gtt as above, will need diuresis. - Lokelma dose for elevated K.  4. Hematuria: Hgb mildly lower at 11.3, having some hematuria. Urology has been consulted.   - Keep on heparin for now unless hgb drops markedly or hematuria worsens considerably, we really need to keep  anticoagulated in order to cardiovert if at all possible.  - Renal ultrasound.   Length of Stay: 2  Loralie Champagne, MD  12/04/2020, 9:25 AM  Advanced Heart Failure Team Pager [phone] (M-F; 7a - 5p)  Please contact [facility] for night-coverage after hours (4p -7a ) and weekends on [email or web address]

## 2020-12-04 NOTE — Progress Notes (Signed)
ANTICOAGULATION CONSULT NOTE  Pharmacy Consult for heparin Indication: atrial fibrillation  No Known Allergies  Patient Measurements: Height: 6\' 1"  (185.4 cm) Weight: 128.5 kg (283 lb 4.8 oz) IBW/kg (Calculated) : 79.9 Heparin Dosing Weight: 108 kg   Vital Signs: Temp: 97.8 F (36.6 C) (05/11 0416) Temp Source: Oral (05/11 0416) BP: 95/74 (05/11 0505) Pulse Rate: 94 (05/11 0416)  Labs: Recent Labs    12/02/20 0914 12/02/20 1115 12/02/20 1742 12/02/20 2238 12/03/20 0415 12/03/20 0543 12/03/20 0934 12/03/20 1046 12/04/20 0305  HGB 13.1  --   --   --  12.4*  --   --   --  11.3*  HCT 42.6  --   --   --  41.4  --   --   --  37.8*  PLT 255  --   --   --  254  --   --   --  231  APTT  --   --   --  97*  --   --  105*  --  141*  LABPROT  --   --  16.3*  --   --   --   --   --   --   INR  --   --  1.3*  --   --   --   --   --   --   HEPARINUNFRC  --   --   --  1.06* 0.84*  --   --   --  0.96*  CREATININE 1.75*  --   --   --   --  1.77*  --  1.91* 2.35*  TROPONINIHS 28* 30*  --   --   --   --   --   --   --     Estimated Creatinine Clearance: 43.4 mL/min (A) (by C-G formula based on SCr of 2.35 mg/dL (H)).  Assessment: 65 y.o. male with h/o Afib, Eliquis on hold, for heparin  Goal of Therapy:  APTT 66-102 sec Heparin level 0.3-0.7 units/ml Monitor platelets by anticoagulation protocol: Yes   Plan:  Decrease Heparin 1350 units/hr APTT in 8 hours   69, PharmD, BCPS

## 2020-12-04 NOTE — Consult Note (Signed)
Urology Consult   Physician requesting consult: Dr. Gasper Sells  Reason for consult: Hematuria  History of Present Illness: Kevin [name] is a 65 y.o. with a history of atrial fibrillation on chronic anticoagulation who was noted to have developed acute exacerbation of CHF with a documented EF of about 25%.  It is felt that his atrial fibrillation may a main contributing factor and he is scheduled for TEE cardioversion tomorrow which will require 30 days of uninterrupted anticoagulation.  He was noted to have had gross hematuria and a pre-emptive urology consultation was obtained to determine if there was anything that should be done from a urologic standpoint to evaluate he hematuria or to minimize his risk of urologic complications related to anticoagulation.   He states that he has had some intermittent gross hematuria for 9 months but has not undergone any urologic evaluation.  His Cr has increased to 2.35 today likely related to pre-renal etiology from his CHF.  He is being heavily diuresed today but has had minimal UOP.   He does have a history of tobacco use but has quit.  He denies a history of voiding or storage urinary symptoms, hematuria, UTIs, STDs, urolithiasis, GU malignancy/trauma/surgery.  Past Medical History:  Diagnosis Date  . Asthma   . Chronic systolic CHF (congestive heart failure) ([place])    a. 06/2015 Echo: EF 25-30%, mod-sev MR, PASP 37mHg. b. 08/2016: echo showing EF of 20-25% with diffuse HK  . Cocaine use   . Essential hypertension   . Hyperglycemia 09/2019  . Hyperlipidemia 09/2019  . Longstanding persistent atrial fibrillation ([facility]    a. Initially dx 06/2015, paroxysmal at that time. b. Recurrent in Feb 2018 and beyond.  . Moderate mitral regurgitation    a. 06/2015 Echo: Mod-Sev MR. b. 08/2016: echo showing moderate MR.   .Marland KitchenNICM (nonischemic cardiomyopathy) ([facility]    a. 06/2015 Echo: EF 25-30%, normal cors by cath - ? Tachy-mediated;    . Non-obstructive CAD     a. 06/2015 Cath: LM nl, LAD 435mLCX nl, RCA nl, EF 25-30%.  . Tobacco use   . Vitamin D deficiency     Past Surgical History:  Procedure Laterality Date  . CARDIAC CATHETERIZATION N/A 07/26/2015   Procedure: Left Heart Cath and Coronary Angiography;  Surgeon: JaJettie BoozeMD;  [facility];  Service: Cardiovascular;  Laterality: N/A;  . CARDIOVERSION N/A 04/09/2017   Procedure: CARDIOVERSION;  Surgeon: RoFay RecordsMD;  [facility] Service: Cardiovascular;  Laterality: N/A;  . TEE WITHOUT CARDIOVERSION N/A 04/09/2017   Procedure: TRANSESOPHAGEAL ECHOCARDIOGRAM (TEE);  Surgeon: RoFay RecordsMD;  [facility];  Service: Cardiovascular;  Laterality: N/A;    Current Hospital Medications:  Home Meds:  No current facility-administered medications on file prior to encounter.   Current Outpatient Medications on File Prior to Encounter  Medication Sig Dispense Refill  . albuterol (PROVENTIL) (2.5 MG/3ML) 0.083% nebulizer solution Take 3 mLs (2.5 mg total) by nebulization every 6 (six) hours as needed for wheezing or shortness of breath. (Patient taking differently: Take 2.5 mg by nebulization See admin instructions. Nebulize 2.5 mg and inhale into the lungs up to six times a day as needed for shortness of breath or wheezing) 75 mL 11  . albuterol (VENTOLIN HFA) 108 (90 Base) MCG/ACT inhaler Inhale 2 puffs into the lungs every 6 (six) hours as needed for wheezing or shortness of breath. (Patient taking differently: Inhale 2 puffs into the lungs See admin  instructions. Inhale 2 puffs into the lungs up to 10-12 times a day (max) as needed for shortness of breath or wheezing) 18 g 11  . apixaban (ELIQUIS) 5 MG TABS tablet Take 1 tablet (5 mg total) by mouth 2 (two) times daily. 180 tablet 3  . atorvastatin (LIPITOR) 10 MG tablet Take 1 tablet (10 mg total) by mouth daily. 90 tablet 3  . cetirizine (ZYRTEC) 10 MG tablet TAKE 1 TABLET BY MOUTH EVERY DAY  (Patient taking differently: Take 10 mg by mouth daily.) 90 tablet 1  . fluticasone (FLONASE) 50 MCG/ACT nasal spray SPRAY 2 SPRAYS INTO EACH NOSTRIL EVERY DAY (Patient taking differently: Place 1 spray into both nostrils See admin instructions. Instill 1 spray into each nostril up to six times a day as needed for congestion) 48 mL 2  . furosemide (LASIX) 40 MG tablet Take 1 tablet (40 mg total) by mouth 2 (two) times daily. (Patient taking differently: Take 80 mg by mouth in the morning.) 180 tablet 3  . glipiZIDE (GLUCOTROL) 10 MG tablet Take 1 tablet (10 mg total) by mouth 2 (two) times daily before a meal. 180 tablet 3  . lansoprazole (PREVACID) 30 MG capsule Take 1 capsule (30 mg total) by mouth daily as needed (for heartburn/indigestion). 90 capsule 3  . metFORMIN (GLUCOPHAGE) 1000 MG tablet Take 1 tablet (1,000 mg total) by mouth 2 (two) times daily with a meal. 180 tablet 3  . montelukast (SINGULAIR) 10 MG tablet Take 1 tablet (10 mg total) by mouth at bedtime. 90 tablet 3  . Multiple Vitamins-Minerals (ADULT ONE DAILY GUMMIES) CHEW Chew 1-2 tablets by mouth daily.    . naproxen sodium (ALEVE) 220 MG tablet Take 220-440 mg by mouth daily as needed (for pain).    Marland Kitchen OVER THE COUNTER MEDICATION Place 1-2 drops into both eyes See admin instructions. [place] Maximum Redness Relief Cooling Eye Drops- Place 1-2 drops into both eyes up to 4 times a day as needed for irritation    . sacubitril-valsartan (ENTRESTO) 49-51 MG Take 1 tablet by mouth 2 (two) times daily. 180 tablet 3  . sitaGLIPtin (JANUVIA) 25 MG tablet Take 1 tablet (25 mg total) by mouth daily. 90 tablet 11  . ACCU-CHEK GUIDE test strip TEST UP TO 4 TIMES A DAY AS DIRECTED (Patient taking differently: 1 each See admin instructions. TEST UP TO 4 TIMES A DAY AS DIRECTED) 100 strip 3  . Accu-Chek Softclix Lancets lancets USE UP TO 4 TIMES A DAY AS DIRECTED (Patient taking differently: 1 each See admin instructions. USE UP TO 4 TIMES A DAY AS  DIRECTED) 100 each 3  . blood glucose meter kit and supplies Dispense based on patient and insurance preference. Use up to four times daily as directed. (FOR ICD-10 E10.9, E11.9). 1 each 0  . cyclobenzaprine (FLEXERIL) 10 MG tablet TAKE 1 TABLET BY MOUTH THREE TIMES A DAY AS NEEDED FOR MUSCLE SPASMS (Patient not taking: Reported on 12/02/2020) 30 tablet 3  . doxycycline (VIBRA-TABS) 100 MG tablet Take 1 tablet (100 mg total) by mouth 2 (two) times daily. (Patient not taking: Reported on 12/02/2020) 14 tablet 0  . metoprolol succinate (TOPROL-XL) 50 MG 24 hr tablet Take 2 tablets (143m) in the morning, 1 tablet (582m in the evening with or after meal. (Patient not taking: No sig reported) 90 tablet 11  . spironolactone (ALDACTONE) 25 MG tablet TAKE 1/2 TO 1 (ONE-HALF TO ONE) TABLET BY MOUTH ONCE DAILY AS  DIRECTED. Please make  appt for future refills. [phone]. 1st attempt. (Patient not taking: Reported on 12/02/2020) 30 tablet 0  . Vitamin D, Ergocalciferol, (DRISDOL) 1.25 MG (50000 UNIT) CAPS capsule TAKE 1 CAPSULE (50,000 UNITS TOTAL) BY MOUTH EVERY 7 (SEVEN) DAYS. (Patient not taking: Reported on 12/02/2020) 5 capsule 3     Scheduled Meds: . atorvastatin  10 mg Oral Daily  . Chlorhexidine Gluconate Cloth  6 each Topical Daily  . insulin aspart  0-5 Units Subcutaneous QHS  . insulin aspart  0-9 Units Subcutaneous TID WC  . loratadine  10 mg Oral Daily  . montelukast  10 mg Oral QHS  . sodium chloride flush  10-40 mL Intracatheter Q12H  . sodium chloride flush  3 mL Intravenous Q12H   Continuous Infusions: . sodium chloride Stopped (12/02/20 2151)  . sodium chloride    . amiodarone    . furosemide (LASIX) 200 mg in dextrose 5% 100 mL (92m/mL) infusion 10 mg/hr (12/04/20 1251)  . heparin 1,350 Units/hr (12/04/20 0955)  . milrinone 0.25 mcg/kg/min (12/04/20 0943)   PRN Meds:.sodium chloride, acetaminophen, ondansetron (ZOFRAN) IV, sodium chloride flush, sodium chloride flush  Allergies: No  Known Allergies  Family History  Problem Relation Age of Onset  . Hypertension Father     Social History:  reports that he has quit smoking. His smoking use included cigarettes. He has a 20.00 pack-year smoking history. He has never used smokeless tobacco. He reports current alcohol use. He reports current drug use.  ROS: A complete review of systems was performed.  All systems are negative except for pertinent findings as noted.  Physical Exam:  Vital signs in last 24 hours: Temp:  [97 F (36.1 C)-98 F (36.7 C)] 97 F (36.1 C) (05/11 1100) Pulse Rate:  [60-117] 64 (05/11 1520) Resp:  [11-23] 13 (05/11 1520) BP: (82-151)/(57-134) 114/98 (05/11 1520) SpO2:  [82 %-99 %] 91 % (05/11 1520) Weight:  [131.6 kg] 131.6 kg (05/11 0614) Constitutional:  Alert and oriented, generalized edeam Cardiovascular: Irregular, tachycardic, bilateral pitting lower extremity edema Respiratory: Normal respiratory effort GI: Abdomen is soft, nontender, no abdominal masses GU: No CVA tenderness, urine is tea colored Lymphatic: No lymphadenopathy Neurologic: Grossly intact, no focal deficits Psychiatric: Normal mood and affect  Laboratory Data:  Recent Labs    12/02/20 0914 12/03/20 0415 12/04/20 0305  WBC 9.3 9.4 9.9  HGB 13.1 12.4* 11.3*  HCT 42.6 41.4 37.8*  PLT 255 254 231    Recent Labs    12/02/20 0914 12/03/20 0543 12/03/20 1046 12/04/20 0305  NA 138 138 136 137  K 5.1 5.4* 5.7* 5.6*  CL 101 102 100 97*  GLUCOSE 119* 112* 139* 91  BUN 43* 52* 56* 63*  CALCIUM 9.3 9.3 9.2 9.1  CREATININE 1.75* 1.77* 1.91* 2.35*     Results for orders placed or performed during the hospital encounter of 12/02/20 (from the past 24 hour(s))  Glucose, capillary     Status: Abnormal   Collection Time: 12/03/20  9:17 PM  Result Value Ref Range   Glucose-Capillary 125 (H) 70 - 99 mg/dL  Basic metabolic panel     Status: Abnormal   Collection Time: 12/04/20  3:05 AM  Result Value Ref Range    Sodium 137 135 - 145 mmol/L   Potassium 5.6 (H) 3.5 - 5.1 mmol/L   Chloride 97 (L) 98 - 111 mmol/L   CO2 31 22 - 32 mmol/L   Glucose, Bld 91 70 - 99 mg/dL   BUN 63 (H)  8 - 23 mg/dL   Creatinine, Ser 2.35 (H) 0.61 - 1.24 mg/dL   Calcium 9.1 8.9 - 10.3 mg/dL   GFR, Estimated 30 (L) >60 mL/min   Anion gap 9 5 - 15  CBC     Status: Abnormal   Collection Time: 12/04/20  3:05 AM  Result Value Ref Range   WBC 9.9 4.0 - 10.5 K/uL   RBC 4.90 4.22 - 5.81 MIL/uL   Hemoglobin 11.3 (L) 13.0 - 17.0 g/dL   HCT 37.8 (L) 39.0 - 52.0 %   MCV 77.1 (L) 80.0 - 100.0 fL   MCH 23.1 (L) 26.0 - 34.0 pg   MCHC 29.9 (L) 30.0 - 36.0 g/dL   RDW 17.8 (H) 11.5 - 15.5 %   Platelets 231 150 - 400 K/uL   nRBC 1.2 (H) 0.0 - 0.2 %  Heparin level (unfractionated)     Status: Abnormal   Collection Time: 12/04/20  3:05 AM  Result Value Ref Range   Heparin Unfractionated 0.96 (H) 0.30 - 0.70 IU/mL  APTT     Status: Abnormal   Collection Time: 12/04/20  3:05 AM  Result Value Ref Range   aPTT 141 (H) 24 - 36 seconds  Magnesium     Status: None   Collection Time: 12/04/20  3:05 AM  Result Value Ref Range   Magnesium 2.4 1.7 - 2.4 mg/dL  Glucose, capillary     Status: None   Collection Time: 12/04/20  6:18 AM  Result Value Ref Range   Glucose-Capillary 93 70 - 99 mg/dL  Urinalysis, Routine w reflex microscopic Urine, Clean Catch     Status: Abnormal   Collection Time: 12/04/20 10:22 AM  Result Value Ref Range   Color, Urine AMBER (A) YELLOW   APPearance CLOUDY (A) CLEAR   Specific Gravity, Urine 1.010 1.005 - 1.030   pH 5.0 5.0 - 8.0   Glucose, UA NEGATIVE NEGATIVE mg/dL   Hgb urine dipstick LARGE (A) NEGATIVE   Bilirubin Urine NEGATIVE NEGATIVE   Ketones, ur NEGATIVE NEGATIVE mg/dL   Protein, ur NEGATIVE NEGATIVE mg/dL   Nitrite NEGATIVE NEGATIVE   Leukocytes,Ua SMALL (A) NEGATIVE   RBC / HPF >50 (H) 0 - 5 RBC/hpf   WBC, UA 11-20 0 - 5 WBC/hpf   Bacteria, UA RARE (A) NONE SEEN   Hyaline Casts, UA  PRESENT    Non Squamous Epithelial 0-5 (A) NONE SEEN  Urine rapid drug screen (hosp performed)     Status: None   Collection Time: 12/04/20 10:23 AM  Result Value Ref Range   Opiates NONE DETECTED NONE DETECTED   Cocaine NONE DETECTED NONE DETECTED   Benzodiazepines NONE DETECTED NONE DETECTED   Amphetamines NONE DETECTED NONE DETECTED   Tetrahydrocannabinol NONE DETECTED NONE DETECTED   Barbiturates NONE DETECTED NONE DETECTED  Glucose, capillary     Status: Abnormal   Collection Time: 12/04/20 11:46 AM  Result Value Ref Range   Glucose-Capillary 169 (H) 70 - 99 mg/dL  .Cooxemetry Panel (carboxy, met, total hgb, O2 sat)     Status: Abnormal   Collection Time: 12/04/20 12:46 PM  Result Value Ref Range   Total hemoglobin 11.5 (L) 12.0 - 16.0 g/dL   O2 Saturation 62.7 %   Carboxyhemoglobin 1.1 0.5 - 1.5 %   Methemoglobin 1.3 0.0 - 1.5 %  Glucose, capillary     Status: Abnormal   Collection Time: 12/04/20  3:20 PM  Result Value Ref Range   Glucose-Capillary 117 (H) 70 -  99 mg/dL   Recent Results (from the past 240 hour(s))  SARS CORONAVIRUS 2 (TAT 6-24 HRS) Nasopharyngeal Nasopharyngeal Swab     Status: None   Collection Time: 12/02/20 10:14 AM   Specimen: Nasopharyngeal Swab  Result Value Ref Range Status   SARS Coronavirus 2 NEGATIVE NEGATIVE Final    Comment: (NOTE) SARS-CoV-2 target nucleic acids are NOT DETECTED.  The SARS-CoV-2 RNA is generally detectable in upper and lower respiratory specimens during the acute phase of infection. Negative results do not preclude SARS-CoV-2 infection, do not rule out co-infections with other pathogens, and should not be used as the sole basis for treatment or other patient management decisions. Negative results must be combined with clinical observations, patient history, and epidemiological information. The expected result is Negative.  Fact Sheet for Patients: SugarRoll.be  Fact Sheet for Healthcare  Providers: https://www.woods-mathews.com/  This test is not yet approved or cleared by the [place] FDA and  has been authorized for detection and/or diagnosis of SARS-CoV-2 by FDA under an Emergency Use Authorization (EUA). This EUA will remain  in effect (meaning this test can be used) for the duration of the COVID-19 declaration under Se ction 564(b)(1) of the Act, 21 U.S.C. section 360bbb-3(b)(1), unless the authorization is terminated or revoked sooner.  Performed at [facility], [place] [address] [place], [place] [address]     Renal Function: Recent Labs    12/02/20 0914 12/03/20 0543 12/03/20 1046 12/04/20 0305  CREATININE 1.75* 1.77* 1.91* 2.35*   Estimated Creatinine Clearance: 44 mL/min (A) (by C-G formula based on SCr of 2.35 mg/dL (H)).  Radiologic Imaging: US RENAL  Result Date: 12/04/2020 CLINICAL DATA:  Hematuria EXAM: RENAL / URINARY TRACT ULTRASOUND COMPLETE COMPARISON:  None. FINDINGS: Right Kidney: Renal measurements: 7.9 x 5.4 x 6.0 cm = volume: 120.0 mL. Echogenicity and renal cortical thickness are within normal limits. No mass, perinephric fluid, or hydronephrosis visualized. No sonographically demonstrable calculus or ureterectasis. Left Kidney: Renal measurements: 9.2 x 5.9 x 5.8 cm = volume: 164.2 mL. Echogenicity and renal cortical thickness within normal limits. No mass, perinephric fluid, or hydronephrosis visualized. No sonographically demonstrable calculus or ureterectasis. Bladder: Appears normal for degree of bladder distention. Other: None. IMPRESSION: Kidneys rather small with right kidney smaller than left kidney. Etiology for small right kidney uncertain. Renal artery stenosis could account for this finding. By report, patient is hypertensive. No obstructing focus in either kidney. The renal echogenicity and cortical thickness bilaterally are within normal limits. Comment: A cause for hematuria has not been established with this  study. Electronically Signed   By: Lowella Grip III M.D.   On: 12/04/2020 15:30   DG Chest Port 1 View  Result Date: 12/04/2020 CLINICAL DATA:  PICC EXAM: PORTABLE CHEST 1 VIEW COMPARISON:  12/03/2020 FINDINGS: Interval placement of right upper extremity PICC, tip position near the superior cavoatrial junction. Unchanged moderate to large right, small left pleural effusions and associated atelectasis or consolidation. No new airspace opacity. Cardiomegaly. IMPRESSION: 1. Interval placement of right upper extremity PICC, tip position near the superior cavoatrial junction. 2. Unchanged moderate to large right, small left pleural effusions and associated atelectasis or consolidation. Electronically Signed   By: Eddie Candle M.D.   On: 12/04/2020 12:30   DG CHEST PORT 1 VIEW  Result Date: 12/03/2020 CLINICAL DATA:  Shortness of breath EXAM: PORTABLE CHEST 1 VIEW COMPARISON:  Dec 02, 2020 FINDINGS: There is a persistent right pleural effusion with suspected atelectasis/consolidation throughout much of the  right middle and lower lobes. The left lung is clear. There is stable cardiomegaly with pulmonary vascularity within normal limits. No adenopathy. No bone lesions. IMPRESSION: Persistent right pleural effusion with suspected atelectasis/consolidation throughout much of the right middle and lower lobes. Left lung clear. Stable cardiac prominence. Electronically Signed   By: Lowella Grip III M.D.   On: 12/03/2020 11:09   ECHOCARDIOGRAM COMPLETE  Result Date: 12/03/2020    ECHOCARDIOGRAM REPORT   Patient Name:   Kevin [name] Date of Exam: 12/03/2020 Medical Rec #:  [id number]    Height:       73.0 in Accession #:    [id number]   Weight:       283.3 lb Date of Birth:  29-Nov-1955    BSA:          2.495 m Patient Age:    79 years     BP:           106/64 mmHg Patient Gender: M            HR:           78 bpm. Exam Location:  Inpatient Procedure: 2D Echo, Cardiac Doppler and Color Doppler Indications:     Dyspnea  History:        Patient has prior history of Echocardiogram examinations, most                 recent 05/19/2018. CHF, Arrythmias:Atrial Fibrillation; Risk                 Factors:Former Smoker and Hypertension.  Sonographer:    Cammy Brochure Referring Phys: [id number] Abigail Butts  Sonographer Comments: Suboptimal subcostal window. Image acquisition challenging due to patient body habitus and Echo performed with patient in sitting position. IMPRESSIONS  1. Left ventricular ejection fraction, by estimation, is 25 to 30%. The left ventricle has severely decreased function. The left ventricle demonstrates global hypokinesis. The left ventricular internal cavity size was moderately dilated. There is mild eccentric left ventricular hypertrophy. Left ventricular diastolic function could not be evaluated. There is the interventricular septum is flattened in diastole ('D' shaped left ventricle), consistent with right ventricular volume overload.  2. Right ventricular systolic function is moderately reduced. The right ventricular size is moderately enlarged. There is mildly elevated pulmonary artery systolic pressure.  3. Left atrial size was severely dilated.  4. Right atrial size was severely dilated.  5. Mitral insufficiency appears to be at least mild-to-moderate, probably worse, in the parasternal long axis view. Very little mitral insufficiency is seen on the apical views. The mitral valve is normal in structure, albeit with annular dilation and some degree of tenting due to LV remodeling.  6. Tricuspid valve regurgitation is moderate to severe.  7. The aortic valve is tricuspid. Aortic valve regurgitation is not visualized. No aortic stenosis is present.  8. There is borderline dilatation of the ascending aorta, measuring 38 mm.  9. The inferior vena cava is dilated in size with <50% respiratory variability, suggesting right atrial pressure of 15 mmHg. Comparison(s): Prior images reviewed side by side.  The left ventricular function is unchanged. The right ventricular systolic function is significantly worse. Mitral insufficiency is worse, but not optimally evaluated. Tricuspid regurgitation is much worse. Conclusion(s)/Recommendation(s): If clinically there appears to be significant mitral insufficiency, consider TEE. However, the mitral insufficency is most likely secondary to the cardiomyopathy and a repeat transthoracic echo after heart failure optimization is also reasonable. FINDINGS  Left Ventricle: Left ventricular  ejection fraction, by estimation, is 25 to 30%. The left ventricle has severely decreased function. The left ventricle demonstrates global hypokinesis. The left ventricular internal cavity size was moderately dilated. There is mild eccentric left ventricular hypertrophy. The interventricular septum is flattened in diastole ('D' shaped left ventricle), consistent with right ventricular volume overload. Left ventricular diastolic function could not be evaluated due to atrial fibrillation. Left ventricular diastolic function could not be evaluated. Right Ventricle: The right ventricular size is moderately enlarged. No increase in right ventricular wall thickness. Right ventricular systolic function is moderately reduced. There is mildly elevated pulmonary artery systolic pressure. The tricuspid regurgitant velocity is 2.67 m/s, and with an assumed right atrial pressure of 15 mmHg, the estimated right ventricular systolic pressure is 16.1 mmHg. Left Atrium: Left atrial size was severely dilated. Right Atrium: Right atrial size was severely dilated. Pericardium: There is no evidence of pericardial effusion. Mitral Valve: Mitral insufficiency appears to be at least mild-to-moderate, probably worse, in the parasternal long axis view. Very little mitral insufficiency is seen on the apical views. The mitral valve is normal in structure. Moderate mitral valve regurgitation. Tricuspid Valve: The tricuspid  valve is grossly normal. Tricuspid valve regurgitation is moderate to severe. Aortic Valve: The aortic valve is tricuspid. Aortic valve regurgitation is not visualized. No aortic stenosis is present. Aortic valve mean gradient measures 3.7 mmHg. Aortic valve peak gradient measures 7.0 mmHg. Aortic valve area, by VTI measures 2.56 cm. Pulmonic Valve: The pulmonic valve was normal in structure. Pulmonic valve regurgitation is not visualized. Aorta: The aortic root is normal in size and structure. There is borderline dilatation of the ascending aorta, measuring 38 mm. Venous: The inferior vena cava is dilated in size with less than 50% respiratory variability, suggesting right atrial pressure of 15 mmHg. IAS/Shunts: No atrial level shunt detected by color flow Doppler.  LEFT VENTRICLE PLAX 2D LVIDd:         6.00 cm LVIDs:         5.20 cm LV PW:         1.20 cm LV IVS:        1.20 cm LVOT diam:     2.30 cm LV SV:         45 LV SV Index:   18 LVOT Area:     4.15 cm  RIGHT VENTRICLE            IVC RV S prime:     8.08 cm/s  IVC diam: 2.50 cm TAPSE (M-mode): 2.2 cm LEFT ATRIUM              Index       RIGHT ATRIUM           Index LA diam:        4.90 cm  1.96 cm/m  RA Area:     36.40 cm LA Vol (A2C):   200.0 ml 80.17 ml/m RA Volume:   140.00 ml 56.12 ml/m LA Vol (A4C):   117.0 ml 46.90 ml/m LA Biplane Vol: 153.0 ml 61.33 ml/m  AORTIC VALVE AV Area (Vmax):    2.21 cm AV Area (Vmean):   2.39 cm AV Area (VTI):     2.56 cm AV Vmax:           132.00 cm/s AV Vmean:          84.367 cm/s AV VTI:            0.177 m AV Peak Grad:  7.0 mmHg AV Mean Grad:      3.7 mmHg LVOT Vmax:         70.30 cm/s LVOT Vmean:        48.433 cm/s LVOT VTI:          0.109 m LVOT/AV VTI ratio: 0.62  AORTA Ao Root diam: 3.10 cm Ao Asc diam:  3.80 cm TRICUSPID VALVE TR Peak grad:   28.5 mmHg TR Vmax:        267.00 cm/s  SHUNTS Systemic VTI:  0.11 m Systemic Diam: 2.30 cm Dani Gobble Croitoru MD Electronically signed by Sanda Klein MD Signature  Date/Time: 12/03/2020/4:38:44 PM    Final    [place] EKG SITE RITE  Result Date: 12/04/2020 If Site Rite image not attached, placement could not be confirmed due to current cardiac rhythm.   I independently reviewed the above imaging studies.  Impression/Recommendation 1) Hematuria: Pt voided while I was seeing him and urine was tea colored.  This was suggest old blood but not active bleeding.  As such, hematuria is not likely to cause for his mild anemia.  Renal and bladder ultrasound do not suggest significant GU pathology for source of bleeding.  I think patient can proceed with cardioversion and ongoing anticoagulation without further urologic evaluation at this time.  He will need to undergo a more formal hematuria evaluation in the near future although this can be performed as an outpatient following improvement of his current acute cardiac problem.  In the meantime, please let me know if he develops more problematic hematuria (clots with difficulty emptying, etc).  Otherwise, please notify me when he is about ready for discharge and I can set up outpatient evaluation.  Dutch Gray 12/04/2020, 4:39 PM    Pryor Curia MD   CC: Dr. Gasper Sells

## 2020-12-04 NOTE — Progress Notes (Signed)
ANTICOAGULATION CONSULT NOTE - Follow Up Consult  Pharmacy Consult for heparin Indication: atrial fibrillation  Labs: Recent Labs    12/02/20 0914 12/02/20 1115 12/02/20 1742 12/02/20 2238 12/03/20 0415 12/03/20 0543 12/03/20 0934 12/03/20 1046 12/04/20 0305 12/04/20 2039  HGB 13.1  --   --   --  12.4*  --   --   --  11.3*  --   HCT 42.6  --   --   --  41.4  --   --   --  37.8*  --   PLT 255  --   --   --  254  --   --   --  231  --   APTT  --   --   --    < >  --   --  105*  --  141* 132*  LABPROT  --   --  16.3*  --   --   --   --   --   --   --   INR  --   --  1.3*  --   --   --   --   --   --   --   HEPARINUNFRC  --   --   --    < > 0.84*  --   --   --  0.96* 0.60  CREATININE 1.75*  --   --   --   --    < >  --  1.91* 2.35* 3.04*  TROPONINIHS 28* 30*  --   --   --   --   --   --   --   --    < > = values in this interval not displayed.    Assessment/Plan:  65yo male therapeutic on heparin after resuming; of note can now monitor by heparin level. Will continue gtt at current rate of 1350 units/hr and confirm stable with am labs.   Vernard Gambles, PharmD, BCPS  12/04/2020,11:49 PM

## 2020-12-04 NOTE — Progress Notes (Signed)
Heart Failure Nurse Navigator Progress Note  AHF rounding team consulted. Pt started on milrinone drip.   Navigator available for resources or education as needed.  Ozella Rocks, RN, BSN Heart Failure Nurse Navigator [phone]

## 2020-12-05 ENCOUNTER — Inpatient Hospital Stay (HOSPITAL_COMMUNITY): Payer: Medicare Other

## 2020-12-05 ENCOUNTER — Encounter (HOSPITAL_COMMUNITY)
Admission: EM | Disposition: A | Discharge status: Home / Self Care | Payer: Self-pay | Source: Home / Self Care | Attending: Cardiology

## 2020-12-05 DIAGNOSIS — N179 Acute kidney failure, unspecified: Secondary | ICD-10-CM

## 2020-12-05 DIAGNOSIS — I5043 Acute on chronic combined systolic (congestive) and diastolic (congestive) heart failure: Secondary | ICD-10-CM | POA: Diagnosis not present

## 2020-12-05 LAB — GLUCOSE, CAPILLARY
Glucose-Capillary: 118 mg/dL — ABNORMAL HIGH (ref 70–99)
Glucose-Capillary: 174 mg/dL — ABNORMAL HIGH (ref 70–99)
Glucose-Capillary: 184 mg/dL — ABNORMAL HIGH (ref 70–99)

## 2020-12-05 LAB — CBC
HCT: 32.6 % — ABNORMAL LOW (ref 39.0–52.0)
Hemoglobin: 9.8 g/dL — ABNORMAL LOW (ref 13.0–17.0)
MCH: 23.1 pg — ABNORMAL LOW (ref 26.0–34.0)
MCHC: 30.1 g/dL (ref 30.0–36.0)
MCV: 76.7 fL — ABNORMAL LOW (ref 80.0–100.0)
Platelets: 197 10*3/uL (ref 150–400)
RBC: 4.25 MIL/uL (ref 4.22–5.81)
RDW: 17.3 % — ABNORMAL HIGH (ref 11.5–15.5)
WBC: 8.8 10*3/uL (ref 4.0–10.5)
nRBC: 1.2 % — ABNORMAL HIGH (ref 0.0–0.2)

## 2020-12-05 LAB — HEPARIN LEVEL (UNFRACTIONATED): Heparin Unfractionated: 0.61 IU/mL (ref 0.30–0.70)

## 2020-12-05 LAB — BASIC METABOLIC PANEL
Anion gap: 7 (ref 5–15)
BUN: 69 mg/dL — ABNORMAL HIGH (ref 8–23)
CO2: 30 mmol/L (ref 22–32)
Calcium: 8.6 mg/dL — ABNORMAL LOW (ref 8.9–10.3)
Chloride: 96 mmol/L — ABNORMAL LOW (ref 98–111)
Creatinine, Ser: 3.49 mg/dL — ABNORMAL HIGH (ref 0.61–1.24)
GFR, Estimated: 19 mL/min — ABNORMAL LOW (ref >60)
Glucose, Bld: 266 mg/dL — ABNORMAL HIGH (ref 70–99)
Potassium: 4.6 mmol/L (ref 3.5–5.1)
Sodium: 133 mmol/L — ABNORMAL LOW (ref 135–145)

## 2020-12-05 LAB — COOXEMETRY PANEL
Carboxyhemoglobin: 0.9 % (ref 0.5–1.5)
Methemoglobin: 1.3 % (ref 0.0–1.5)
O2 Saturation: 68.4 %
Total hemoglobin: 10.2 g/dL — ABNORMAL LOW (ref 12.0–16.0)

## 2020-12-05 LAB — LACTIC ACID, PLASMA
Lactic Acid, Venous: 1.2 mmol/L (ref 0.5–1.9)
Lactic Acid, Venous: 0.6 mmol/L (ref 0.5–1.9)

## 2020-12-05 LAB — RENAL FUNCTION PANEL
Albumin: 3 g/dL — ABNORMAL LOW (ref 3.5–5.0)
Anion gap: 7 (ref 5–15)
BUN: 73 mg/dL — ABNORMAL HIGH (ref 8–23)
CO2: 29 mmol/L (ref 22–32)
Calcium: 9 mg/dL (ref 8.9–10.3)
Chloride: 98 mmol/L (ref 98–111)
Creatinine, Ser: 3.84 mg/dL — ABNORMAL HIGH (ref 0.61–1.24)
GFR, Estimated: 17 mL/min — ABNORMAL LOW (ref >60)
Glucose, Bld: 135 mg/dL — ABNORMAL HIGH (ref 70–99)
Phosphorus: 5.9 mg/dL — ABNORMAL HIGH (ref 2.5–4.6)
Potassium: 4.5 mmol/L (ref 3.5–5.1)
Sodium: 134 mmol/L — ABNORMAL LOW (ref 135–145)

## 2020-12-05 LAB — IRON AND TIBC
Iron: 37 ug/dL — ABNORMAL LOW (ref 45–182)
Saturation Ratios: 9 % — ABNORMAL LOW (ref 17.9–39.5)
TIBC: 412 ug/dL (ref 250–450)
UIBC: 375 ug/dL

## 2020-12-05 LAB — LIPID PANEL
Cholesterol: 68 mg/dL (ref 0–200)
HDL: 23 mg/dL — ABNORMAL LOW (ref >40)
LDL Cholesterol: 35 mg/dL (ref 0–99)
Total CHOL/HDL Ratio: 3 RATIO
Triglycerides: 48 mg/dL (ref <150)
VLDL: 10 mg/dL (ref 0–40)

## 2020-12-05 LAB — PROTIME-INR
INR: 1.1 (ref 0.8–1.2)
Prothrombin Time: 14.5 seconds (ref 11.4–15.2)

## 2020-12-05 LAB — MAGNESIUM: Magnesium: 2.3 mg/dL (ref 1.7–2.4)

## 2020-12-05 LAB — MRSA PCR SCREENING: MRSA by PCR: NEGATIVE

## 2020-12-05 LAB — FERRITIN: Ferritin: 23 ng/mL — ABNORMAL LOW (ref 24–336)

## 2020-12-05 SURGERY — ECHOCARDIOGRAM, TRANSESOPHAGEAL
Anesthesia: General

## 2020-12-05 MED ORDER — PRISMASOL BGK 4/2.5 32-4-2.5 MEQ/L REPLACEMENT SOLN: Status: DC

## 2020-12-05 MED ORDER — AMIODARONE IV BOLUS ONLY 150 MG/100ML
150.0000 mg | Freq: Once | INTRAVENOUS | Status: AC
  Administered 2020-12-05: 150 mg via INTRAVENOUS

## 2020-12-05 MED ORDER — NOREPINEPHRINE 16 MG/250ML-% IV SOLN
0.0000 ug/min | INTRAVENOUS | Status: DC
  Administered 2020-12-05: 6 ug/min via INTRAVENOUS
  Administered 2020-12-06: 17 ug/min via INTRAVENOUS
  Administered 2020-12-07: 1 ug/min via INTRAVENOUS
  Filled 2020-12-05 (×3): qty 250

## 2020-12-05 MED ORDER — SODIUM CHLORIDE 0.9 % IV SOLN: INTRAVENOUS | Status: DC

## 2020-12-05 MED ORDER — SODIUM CHLORIDE 0.9 % IV SOLN: INTRAVENOUS | Status: DC | PRN

## 2020-12-05 MED ORDER — SODIUM CHLORIDE 0.9 % IV SOLN
510.0000 mg | Freq: Once | INTRAVENOUS | Status: AC
  Administered 2020-12-05: 510 mg via INTRAVENOUS
  Filled 2020-12-05: qty 17

## 2020-12-05 MED ORDER — NOREPINEPHRINE 4 MG/250ML-% IV SOLN
3.0000 ug/min | INTRAVENOUS | Status: DC
  Administered 2020-12-05: 3 ug/min via INTRAVENOUS
  Filled 2020-12-05 (×2): qty 250

## 2020-12-05 MED ORDER — HEPARIN SODIUM (PORCINE) 1000 UNIT/ML DIALYSIS
1000.0000 [IU] | INTRAMUSCULAR | Status: DC | PRN
  Administered 2020-12-09: 4000 [IU] via INTRAVENOUS_CENTRAL
  Administered 2020-12-10 – 2020-12-13 (×4): 2800 [IU] via INTRAVENOUS_CENTRAL
  Filled 2020-12-05 (×3): qty 6
  Filled 2020-12-05: qty 3
  Filled 2020-12-05 (×2): qty 6
  Filled 2020-12-05: qty 4
  Filled 2020-12-05 (×2): qty 6

## 2020-12-05 MED ORDER — AMIODARONE LOAD VIA INFUSION
150.0000 mg | Freq: Once | INTRAVENOUS | Status: AC
  Administered 2020-12-05: 150 mg via INTRAVENOUS
  Filled 2020-12-05: qty 83.34

## 2020-12-05 MED ORDER — PRISMASOL BGK 4/2.5 32-4-2.5 MEQ/L EC SOLN: Status: DC

## 2020-12-05 MED ORDER — METOLAZONE 2.5 MG PO TABS
2.5000 mg | ORAL_TABLET | Freq: Once | ORAL | Status: AC
  Administered 2020-12-05: 2.5 mg via ORAL
  Filled 2020-12-05: qty 1

## 2020-12-05 NOTE — Progress Notes (Signed)
Orthopedic Tech Progress Note Patient Details:  Kevin [name] 08/02/1955 [id number]  Ortho Devices Type of Ortho Device: Roland Rack boot Ortho Device/Splint Location: BLE Ortho Device/Splint Interventions: Ordered,Application   Post Interventions Patient Tolerated: Well Instructions Provided: Care of device   Kevin [name] 12/05/2020, 1:57 PM

## 2020-12-05 NOTE — Progress Notes (Signed)
   12/04/20 2356  Assess: MEWS Score  Temp 97.6 F (36.4 C)  BP (!) 125/103  Pulse Rate (!) 125  Resp 18  SpO2 95 %  O2 Device Nasal Cannula  O2 Flow Rate (L/min) 4 L/min  Assess: MEWS Score  MEWS Temp 0  MEWS Systolic 0  MEWS Pulse 2  MEWS RR 0  MEWS LOC 0  MEWS Score 2  MEWS Score Color Yellow  Assess: if the MEWS score is Yellow or Red  Were vital signs taken at a resting state? Yes  Focused Assessment No change from prior assessment  Early Detection of Sepsis Score *See Row Information* Medium  MEWS guidelines implemented *See Row Information* No, previously yellow, continue vital signs every 4 hours  Notify: Charge Nurse/RN  Name of Charge Nurse/RN Notified [place] RN  Date Charge Nurse/RN Notified 12/05/20  Time Charge Nurse/RN Notified 0000  Document  Patient Outcome Other (Comment)  Progress note created (see row info) Yes  Assess: SIRS CRITERIA  SIRS Temperature  0  SIRS Pulse 1  SIRS Respirations  0  SIRS WBC 0  SIRS Score Sum  1

## 2020-12-05 NOTE — Progress Notes (Signed)
  Remains in A fib RVR with marked volume overload. Worsening renal function with minimal urine ouput.   CVP 24-25   Currently on amio 60 mg per hour, norepi 6 mcg, milrinone 0.25 mcg, lasix drip 15 mg per hour.   Consulted Nephrology at the request of Dr Shirlee Latch. Dr Thedore Mins consulted.   Sherrise Liberto NP-C  1:57 PM

## 2020-12-05 NOTE — Progress Notes (Signed)
Report called to Enriqueta Shutter, RN on 2H.

## 2020-12-05 NOTE — Progress Notes (Signed)
ANTICOAGULATION CONSULT NOTE - Follow Up Consult  Pharmacy Consult for heparin Indication: atrial fibrillation  Labs: Recent Labs    12/02/20 0914 12/02/20 1115 12/02/20 1742 12/02/20 2238 12/03/20 0415 12/03/20 0543 12/03/20 0934 12/03/20 1046 12/04/20 0305 12/04/20 2039 12/05/20 0426  HGB 13.1  --   --   --  12.4*  --   --   --  11.3*  --  9.8*  HCT 42.6  --   --   --  41.4  --   --   --  37.8*  --  32.6*  PLT 255  --   --   --  254  --   --   --  231  --  197  APTT  --   --   --    < >  --   --  105*  --  141* 132*  --   LABPROT  --   --  16.3*  --   --   --   --   --   --   --   --   INR  --   --  1.3*  --   --   --   --   --   --   --   --   HEPARINUNFRC  --   --   --    < > 0.84*  --   --   --  0.96* 0.60 0.61  CREATININE 1.75*  --   --   --   --    < >  --    < > 2.35* 3.04* 3.49*  TROPONINIHS 28* 30*  --   --   --   --   --   --   --   --   --    < > = values in this interval not displayed.    Assessment/Plan:  65yo male therapeutic on heparin for Afib after resuming; HgB down to 9.8 with long standing hematuria. Planning DCCV this admission.   Continue heparin at 1350 units / hr.   Follow up AM labs  Thank you Okey Regal, PharmD 12/05/2020,8:25 AM

## 2020-12-05 NOTE — Progress Notes (Addendum)
Patient ID: Kevin [name], male   DOB: Jan 19, 1956, 65 y.o.   MRN: [id number]     Advanced Heart Failure Rounding Note  PCP-Cardiologist: Chrystie Nose, MD   Subjective:    Creatinine continues to rise despite initiation of milrinone 0.25, up to 3.49 this morning.  Co-ox 68%.  SBP low at times, generally appears to run in 90s.  He is on Lasix gtt 10 mg/hr, only 550 cc UOP.  CVP 18.   Atrial fibrillation rate 120s on amiodarone gtt 30 mg/hr.   Renal US unremarkable, seen by urology yesterday pm.  Suspect old blood in urine and does not think active hemorrhage, recommend continue heparin gtt.  Hgb 9.8 today.    Objective:   Weight Range: 131.4 kg Body mass index is 38.22 kg/m.   Vital Signs:   Temp:  [97 F (36.1 C)-97.6 F (36.4 C)] 97.6 F (36.4 C) (05/12 0408) Pulse Rate:  [60-125] 116 (05/12 0408) Resp:  [13-23] 18 (05/12 0408) BP: (82-131)/(57-103) 90/58 (05/12 0408) SpO2:  [82 %-97 %] 94 % (05/12 0408) Weight:  [131.4 kg] 131.4 kg (05/12 0408) Last BM Date: 12/02/20  Weight change: Filed Weights   12/03/20 0508 12/04/20 0614 12/05/20 0408  Weight: 128.5 kg 131.6 kg 131.4 kg    Intake/Output:   Intake/Output Summary (Last 24 hours) at 12/05/2020 0810 Last data filed at 12/05/2020 0103 Gross per 24 hour  Intake 1123.8 ml  Output 550 ml  Net 573.8 ml      Physical Exam    General:  Well appearing. No resp difficulty HEENT: Normal Neck: Supple. JVP 16+. Carotids 2+ bilat; no bruits. No lymphadenopathy or thyromegaly appreciated. Cor: PMI nondisplaced. Irregular rate & rhythm. No rubs, gallops or murmurs. Lungs: Clear Abdomen: Soft, nontender, nondistended. No hepatosplenomegaly. No bruits or masses. Good bowel sounds. Extremities: No cyanosis, clubbing, rash. 2+ edema to thighs.  Neuro: Alert & orientedx3, cranial nerves grossly intact. moves all 4 extremities w/o difficulty. Affect pleasant   Telemetry   Atrial fibrillation rate 120s (personally  reviewed)  Labs    CBC Recent Labs    12/02/20 0914 12/03/20 0415 12/04/20 0305 12/05/20 0426  WBC 9.3   < > 9.9 8.8  NEUTROABS 5.9  --   --   --   HGB 13.1   < > 11.3* 9.8*  HCT 42.6   < > 37.8* 32.6*  MCV 75.4*   < > 77.1* 76.7*  PLT 255   < > 231 197   < > = values in this interval not displayed.   Basic Metabolic Panel Recent Labs    80/99/83 0305 12/04/20 2039 12/05/20 0426  NA 137 134* 133*  K 5.6* 4.6 4.6  CL 97* 95* 96*  CO2 31 31 30   GLUCOSE 91 267* 266*  BUN 63* 68* 69*  CREATININE 2.35* 3.04* 3.49*  CALCIUM 9.1 8.5* 8.6*  MG 2.4  --  2.3   Liver Function Tests Recent Labs    12/02/20 0914  AST 19  ALT 17  ALKPHOS 104  BILITOT 2.4*  PROT 7.3  ALBUMIN 3.6   No results for input(s): LIPASE, AMYLASE in the last 72 hours. Cardiac Enzymes No results for input(s): CKTOTAL, CKMB, CKMBINDEX, TROPONINI in the last 72 hours.  BNP: BNP (last 3 results) Recent Labs    12/02/20 0915  BNP 1,158.5*    ProBNP (last 3 results) No results for input(s): PROBNP in the last 8760 hours.   D-Dimer No results for input(s): DDIMER  in the last 72 hours. Hemoglobin A1C Recent Labs    12/02/20 1742  HGBA1C 6.4*   Fasting Lipid Panel Recent Labs    12/05/20 0426  CHOL 68  HDL 23*  LDLCALC 35  TRIG 48  CHOLHDL 3.0   Thyroid Function Tests No results for input(s): TSH, T4TOTAL, T3FREE, THYROIDAB in the last 72 hours.  Invalid input(s): FREET3  Other results:   Imaging    US RENAL  Result Date: 12/04/2020 CLINICAL DATA:  Hematuria EXAM: RENAL / URINARY TRACT ULTRASOUND COMPLETE COMPARISON:  None. FINDINGS: Right Kidney: Renal measurements: 7.9 x 5.4 x 6.0 cm = volume: 120.0 mL. Echogenicity and renal cortical thickness are within normal limits. No mass, perinephric fluid, or hydronephrosis visualized. No sonographically demonstrable calculus or ureterectasis. Left Kidney: Renal measurements: 9.2 x 5.9 x 5.8 cm = volume: 164.2 mL. Echogenicity and  renal cortical thickness within normal limits. No mass, perinephric fluid, or hydronephrosis visualized. No sonographically demonstrable calculus or ureterectasis. Bladder: Appears normal for degree of bladder distention. Other: None. IMPRESSION: Kidneys rather small with right kidney smaller than left kidney. Etiology for small right kidney uncertain. Renal artery stenosis could account for this finding. By report, patient is hypertensive. No obstructing focus in either kidney. The renal echogenicity and cortical thickness bilaterally are within normal limits. Comment: A cause for hematuria has not been established with this study. Electronically Signed   By: Bretta Bang III M.D.   On: 12/04/2020 15:30   DG Chest Port 1 View  Result Date: 12/04/2020 CLINICAL DATA:  PICC EXAM: PORTABLE CHEST 1 VIEW COMPARISON:  12/03/2020 FINDINGS: Interval placement of right upper extremity PICC, tip position near the superior cavoatrial junction. Unchanged moderate to large right, small left pleural effusions and associated atelectasis or consolidation. No new airspace opacity. Cardiomegaly. IMPRESSION: 1. Interval placement of right upper extremity PICC, tip position near the superior cavoatrial junction. 2. Unchanged moderate to large right, small left pleural effusions and associated atelectasis or consolidation. Electronically Signed   By: Lauralyn Primes M.D.   On: 12/04/2020 12:30      Medications:     Scheduled Medications: . atorvastatin  10 mg Oral Daily  . Chlorhexidine Gluconate Cloth  6 each Topical Daily  . insulin aspart  0-5 Units Subcutaneous QHS  . insulin aspart  0-9 Units Subcutaneous TID WC  . loratadine  10 mg Oral Daily  . metolazone  2.5 mg Oral Once  . montelukast  10 mg Oral QHS  . sodium chloride flush  10-40 mL Intracatheter Q12H  . sodium chloride flush  3 mL Intravenous Q12H     Infusions: . sodium chloride Stopped (12/02/20 2151)  . sodium chloride    . sodium chloride     . amiodarone 30 mg/hr (12/05/20 0103)  . furosemide (LASIX) 200 mg in dextrose 5% 100 mL (2mg /mL) infusion 10 mg/hr (12/05/20 0620)  . heparin 1,350 Units/hr (12/05/20 0532)  . milrinone 0.25 mcg/kg/min (12/05/20 0526)  . norepinephrine (LEVOPHED) Adult infusion       PRN Medications:  sodium chloride, Place/Maintain arterial line **AND** sodium chloride, acetaminophen, ondansetron (ZOFRAN) IV, sodium chloride flush, sodium chloride flush    Assessment/Plan   1. Acute on chronic systolic CHF: Nonischemic cardiomyopathy.  Echo this admission with EF 25-30%, moderate LV dilation, mild LVH, moderate RVE with moderately decreased RV systolic function, severe biatrial enlargement, moderate-severe TR, IVC dilated. EF was low starting in 2016, cath at that time showed mild nonobstructive CAD.  Patient had runs  of AF but not sustained when initial diagnosis was made.  Was readmitted in 2018 with persistent AF and CHF, echo with EF 20-25%.  Had DCCV back to NSR at that time.  EF up to 55-60% in 2019.  Lost to followup, now back with progressive dyspnea/volume overload and EF back down in setting of AF with RVR.  Difficult to identify initial etiology of CMP but cannot rule out tachy-mediated.  UDS negative for cocaine. 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  He does not feel AF and we are not sure how long he has been in it.  He is 30+ lbs up with marked volume overload and concern for low output HF (progressive rise in creatinine with attempts at diuresis).  Poor response so far to initiation of milrinone 0.25, creatinine continues to rise up to 3.49 today.  Co-ox good at 68% with CVP 18 this morning.  Marked peripheral edema. SBP low at times, generally in 90s.  - At this point, think he needs to be in CCU.  Would like arterial line to better follow MAP.  - Continue milrinone 0.25, will add norepinephrine at low dose (3) to maintain higher MAP.  - Lasix to 15 mg/hr with  metolazone 2.5 x 1.  - Will need eventual RHC, consider repeat coronary angiography if creatinine improves significantly => HS-TnI slightly elevated with no trend so no ACS.  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   - Ultimately needs conversion to NSR, had wanted to diurese first to give [place] the best chance of maintaining NSR, but diuresis is proving difficult as is rate control.  He has had breakfast this morning, will try to arrange for TEE-DCCV in am.  - Consider eventual cardiac MRI.  - Unna boots.  2. Atrial fibrillation: Admitted with afib/RVR, uncertain how long this has been present.  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 Needs conversion back to NSR. - Would like to see better diuresis before DCCV as he is markedly volume overloaded and less likely to hold NSR long-term, but given difficult with diuresis and rate control think we will have to proceed sooner rather than later.  He has eaten this morning, will try to set up TEE-DCCV for tomorrow.   - Increase amiodarone gtt to 60 mg/hr.  - Continue on heparin gtt for now, eventually transition to Eliquis.  3. AKI: Uncertain baseline, creatinine 1.77 at arrival, creatinine up to 3.49 with poor diuresis.  As above, concerned for low output. Marked volume overload. Co-ox good today but SBP running 90s.  - Continue milrinone but add NE 3 to help maintain higher MAP for renal perfusion.  4. Hematuria: Hgb 9.8, seen by urology => think old blood in urine, renal ultrasound ok.  Recommend continuing heparin gtt.  - Check Fe studies, can give IV Fe if low.   Transfer to CCU for arterial line, norepinephrine.   CRITICAL CARE Performed by: Marca Ancona  Total critical care time: 40 minutes  Critical care time was exclusive of separately billable procedures and treating other patients.  Critical care was necessary to treat or prevent imminent or life-threatening deterioration.  Critical care was time spent personally by me on the  following activities: development of treatment plan with patient and/or surrogate as well as nursing, discussions with consultants, evaluation of patient's response to treatment, examination of patient, obtaining history from patient or surrogate, ordering and performing treatments and interventions, ordering and review of laboratory studies, ordering and review of radiographic studies,  pulse oximetry and re-evaluation of patient's condition.   Length of Stay: 3  Marca Ancona, MD  12/05/2020, 8:10 AM  Advanced Heart Failure Team Pager [phone] (M-F; 7a - 5p)  Please contact [facility] for night-coverage after hours (5p -7a ) and weekends on [email or web address]

## 2020-12-05 NOTE — Procedures (Signed)
Arterial Catheter Insertion Procedure Note  Kevin [name]  [id number]  28-Feb-1956  Date:12/05/20  Time:10:42 AM    Provider Performing: Guss Bunde    Procedure: Insertion of Arterial Line (14239) without US guidance  Indication(s) Blood pressure monitoring and/or need for frequent ABGs  Consent Risks of the procedure as well as the alternatives and risks of each were explained to the patient and/or caregiver.  Consent for the procedure was obtained and is signed in the bedside chart  Anesthesia None   Time Out Verified patient identification, verified procedure, site/side was marked, verified correct patient position, special equipment/implants available, medications/allergies/relevant history reviewed, required imaging and test results available.   Sterile Technique Maximal sterile technique including full sterile barrier drape, hand hygiene, sterile gown, sterile gloves, mask, hair covering, sterile ultrasound probe cover (if used).   Procedure Description Area of catheter insertion was cleaned with chlorhexidine and draped in sterile fashion. Without real-time ultrasound guidance an arterial catheter was placed into the left radial artery.  Appropriate arterial tracings confirmed on monitor.     Complications/Tolerance None; patient tolerated the procedure well.   EBL Minimal   Specimen(s) None

## 2020-12-05 NOTE — Consult Note (Addendum)
Nephrology Consult   Requesting provider: Laurey Morale, MD  Assessment/Recommendations:   AKI (anuric): Likely secondary to CRS -persistently volume up with lack of adequate urine output in the context of lasix gtt, milrinone, and levophed. Will start CRRT to help with UF, discussed with patient and he agrees with plan -appreciate assistance from CCM for temp HD catheter placement -no obstruction on renal u/s -Maintain MAP>65 for optimal renal perfusion.  -Avoid nephrotoxic medications including NSAIDs and iodinated intravenous contrast exposure unless the latter is absolutely indicated.  Preferred narcotic agents for pain control are hydromorphone, fentanyl, and methadone. Morphine should not be used. Avoid Baclofen and avoid oral sodium phosphate and magnesium citrate based laxatives / bowel preps. Continue strict Input and Output monitoring, CRRT labs, and daily weights. Will monitor the patient closely with you and intervene or adjust therapy as indicated by changes in clinical status/labs   Hypervolemic hyponatremia -UF as tolerated  Acute on chronic systolic HF -UF w/ crrt as above, will need RHC at some point, per HF team  Hypotension -pressor support per primary service  Hematuria -eval'ed by GU, probable old blood, u/s unremarkable  Afib w/ RVR -on amio and hep gtt -potential TEE-DCCV  Anemia, microcytic -Transfuse for Hgb<7 g/dL. Hgb from 11.3 yesterday, 9.8 today. On hep gtt. Hematuria as above, rule out other sources of bleed? Will defer to primary service  Recommendations conveyed to primary service and ICU staff. Attempted to call son for update--no answer  Anthony Sar [facility] 12/05/2020 2:22 PM   _____________________________________________________________________________________   History of Present Illness: Kevin [name] is a 65 y.o. male with a past medical history of hypertension, DM 2, chronic combined CHF, nonischemic  cardiomyopathy, nonobstructive CAD, paroxysmal A. fib, mitral regurgitation, COPD, ex-smoker who presents to [facility] initially with shortness of breath and edema.  He was found to be tachycardic with a heart rate up to 160s.  He was intermittently hypotensive.  He was started on Cardizem drip initially with improvement in heart rate.  He was started on Lasix but has not had a good response in terms of his volume status.  Transition to Lasix drip (currently on 15 mg/h).  Was also started on milrinone but despite this creatinine continue to rise.  Given his intermittently hypotensive episode/soft BP he was transferred to ICU for Levophed support.  Per staff, has not had any urine output since late last night. There was a concern of hematuria (which apparently he has had intermittently in the past), was evaluated by GU. Ultrasound was unremarkable and there has not been any active bleeding. Patient does report that shortness of breath is unchanged today.  He does report that his swelling is bothering him.  We did discuss the role of renal replacement therapy (CRRT) at this junction and he is accepting of this at this time. Denies fevers, chest pain, dizziness at the moment.  Medications:  Current Facility-Administered Medications  Medication Dose Route Frequency Provider Last Rate Last Admin  . 0.9 %  sodium chloride infusion   Intravenous Continuous Beatriz Stallion., PA-C   Held at 12/02/20 2151  . 0.9 %  sodium chloride infusion  250 mL Intravenous PRN Kroeger, Krista M., PA-C      . 0.9 %  sodium chloride infusion   Intra-arterial PRN Laurey Morale, MD      . 0.9 %  sodium chloride infusion   Intravenous Continuous Clegg, Amy D, NP      . acetaminophen (TYLENOL) tablet 650  mg  650 mg Oral Q4H PRN Riley Nearing, Krista M., PA-C      . amiodarone (NEXTERONE PREMIX) 360-4.14 MG/200ML-% (1.8 mg/mL) IV infusion  60 mg/hr Intravenous Continuous Laurey Morale, MD 33.3 mL/hr at 12/05/20 1335 60 mg/hr at 12/05/20  1335  . atorvastatin (LIPITOR) tablet 10 mg  10 mg Oral Daily Judy Pimple M., PA-C   10 mg at 12/05/20 1127  . Chlorhexidine Gluconate Cloth 2 % PADS 6 each  6 each Topical Daily Chandrasekhar, Mahesh A, MD   6 each at 12/05/20 1000  . furosemide (LASIX) 200 mg in dextrose 5 % 100 mL (2 mg/mL) infusion  15 mg/hr Intravenous Continuous Laurey Morale, MD 7.5 mL/hr at 12/05/20 0815 15 mg/hr at 12/05/20 0815  . heparin ADULT infusion 100 units/mL (25000 units/256mL)  1,350 Units/hr Intravenous Continuous Chandrasekhar, Mahesh A, MD 13.5 mL/hr at 12/05/20 0532 1,350 Units/hr at 12/05/20 0532  . insulin aspart (novoLOG) injection 0-5 Units  0-5 Units Subcutaneous QHS Kroeger, Krista M., PA-C      . insulin aspart (novoLOG) injection 0-9 Units  0-9 Units Subcutaneous TID WC Judy Pimple M., PA-C   2 Units at 12/05/20 1127  . loratadine (CLARITIN) tablet 10 mg  10 mg Oral Daily Judy Pimple M., PA-C   10 mg at 12/05/20 1127  . milrinone (PRIMACOR) 20 MG/100 ML (0.2 mg/mL) infusion  0.25 mcg/kg/min Intravenous Continuous Laurey Morale, MD 9.87 mL/hr at 12/05/20 1328 0.25 mcg/kg/min at 12/05/20 1328  . montelukast (SINGULAIR) tablet 10 mg  10 mg Oral QHS Judy Pimple M., PA-C   10 mg at 12/04/20 2148  . norepinephrine (LEVOPHED) 16 mg in premix infusion  0-40 mcg/min Intravenous Titrated Laurey Morale, MD 5.63 mL/hr at 12/05/20 1332 6 mcg/min at 12/05/20 1332  . ondansetron (ZOFRAN) injection 4 mg  4 mg Intravenous Q6H PRN Riley Nearing, Krista M., PA-C      . sodium chloride flush (NS) 0.9 % injection 10-40 mL  10-40 mL Intracatheter Q12H Chandrasekhar, Mahesh A, MD   30 mL at 12/04/20 2148  . sodium chloride flush (NS) 0.9 % injection 10-40 mL  10-40 mL Intracatheter PRN Chandrasekhar, Mahesh A, MD      . sodium chloride flush (NS) 0.9 % injection 3 mL  3 mL Intravenous Q12H Kroeger, Krista M., PA-C   3 mL at 12/05/20 0908  . sodium chloride flush (NS) 0.9 % injection 3 mL  3 mL  Intravenous PRN Riley Nearing, Ovidio Kin., PA-C         ALLERGIES Patient has no known allergies.  MEDICAL HISTORY Past Medical History:  Diagnosis Date  . Asthma   . Chronic systolic CHF (congestive heart failure) (HCC)    a. 06/2015 Echo: EF 25-30%, mod-sev MR, PASP . b. 08/2016: echo showing EF of 20-25% with diffuse HK  . Cocaine use   . Essential hypertension   . Hyperglycemia 09/2019  . Hyperlipidemia 09/2019  . Longstanding persistent atrial fibrillation (HCC)    a. Initially dx 06/2015, paroxysmal at that time. b. Recurrent in Feb 2018 and beyond.  . Moderate mitral regurgitation    a. 06/2015 Echo: Mod-Sev MR. b. 08/2016: echo showing moderate MR.   Marland Kitchen NICM (nonischemic cardiomyopathy) (HCC)    a. 06/2015 Echo: EF 25-30%, normal cors by cath - ? Tachy-mediated;    . Non-obstructive CAD    a. 06/2015 Cath: LM nl, LAD 33m, LCX nl, RCA nl, EF 25-30%.  . Tobacco use   . Vitamin D  deficiency      SOCIAL HISTORY Social History   Socioeconomic History  . Marital status: Single    Spouse name: Not on file  . Number of children: Not on file  . Years of education: Not on file  . Highest education level: Not on file  Occupational History    Employer: [employer]  Tobacco Use  . Smoking status: Former Smoker    Packs/day: 0.50    Years: 40.00    Pack years: 20.00    Types: Cigarettes  . Smokeless tobacco: Never Used  . Tobacco comment: Quit in 2014  Vaping Use  . Vaping Use: Never used  Substance and Sexual Activity  . Alcohol use: Yes    Alcohol/week: 0.0 standard drinks    Comment: 2-3 drinks per week.  . Drug use: Yes    Comment: Not currently. Quit in 2014. 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  . Sexual activity: Not Currently  Other Topics Concern  . Not on file  Social History Narrative  . Not on file   Social Determinants of Health   Financial Resource Strain: Not on file  Food Insecurity: Not on file  Transportation Needs: Not on file  Physical Activity:  Not on file  Stress: Not on file  Social Connections: Not on file  Intimate Partner Violence: Not on file     FAMILY HISTORY Family History  Problem Relation Age of Onset  . Hypertension Father      Review of Systems: 12 systems reviewed Otherwise as per HPI, all other systems reviewed and negative  Physical Exam: Vitals:   12/05/20 1030 12/05/20 1101  BP: 106/65   Pulse: (!) 120   Resp: (!) 21   Temp:  97.7 F (36.5 C)  SpO2: 100%    No intake/output data recorded.  Intake/Output Summary (Last 24 hours) at 12/05/2020 1422 Last data filed at 12/05/2020 0103 Gross per 24 hour  Intake 906.32 ml  Output 350 ml  Net 556.32 ml   General: ill-appearing, no acute distress HEENT: anicteric sclera, oropharynx clear without lesions CV: irreg irreg, no murmurs, no gallops, no rubs, +jvd Lungs: clear to auscultation bilaterally, normal work of breathing Abd: soft, non-tender, distended Skin: no visible lesions or rashes Musculoskeletal: anasarca, bilateral LE's wrapped, +thigh edema Neuro: normal speech, no gross focal deficits, sleepy but easily arousable, engaged  Test Results Reviewed Lab Results  Component Value Date   NA 133 (L) 12/05/2020   K 4.6 12/05/2020   CL 96 (L) 12/05/2020   CO2 30 12/05/2020   BUN 69 (H) 12/05/2020   CREATININE 3.49 (H) 12/05/2020   CALCIUM 8.6 (L) 12/05/2020   ALBUMIN 3.6 12/02/2020   PHOS 4.1 07/24/2015     I have reviewed all relevant outside healthcare records related to the patient's kidney injury.

## 2020-12-05 NOTE — Progress Notes (Signed)
Dr. Shirlee Latch gave verbal order for amio bolus 150 mg via infusion.

## 2020-12-05 NOTE — Procedures (Signed)
Central Venous Catheter Insertion Procedure Note  Kevin [name]  [id number]  Apr 25, 1956  Date:12/05/20  Time:4:44 PM   Provider Performing:Garrus Gauthreaux Mechele Collin   Procedure: Insertion of Non-tunneled Central Venous Catheter(36556)with US guidance (73428)    Indication(s) Hemodialysis  Consent Risks of the procedure as well as the alternatives and risks of each were explained to the patient and/or caregiver.  Consent for the procedure was obtained and is signed in the bedside chart  Anesthesia Topical only with 1% lidocaine   Timeout Verified patient identification, verified procedure, site/side was marked, verified correct patient position, special equipment/implants available, medications/allergies/relevant history reviewed, required imaging and test results available.  Sterile Technique Maximal sterile technique including full sterile barrier drape, hand hygiene, sterile gown, sterile gloves, mask, hair covering, sterile ultrasound probe cover (if used).  Procedure Description Heparin was held prior to procedure. Area of catheter insertion was cleaned with chlorhexidine and draped in sterile fashion.   With real-time ultrasound guidance a HD catheter was placed into the left internal jugular vein.     Nonpulsatile blood flow and easy flushing noted in all ports.  The catheter was sutured in place and sterile dressing applied.  Complications/Tolerance None; patient tolerated the procedure well. Chest X-ray is ordered to verify placement for internal jugular or subclavian cannulation.  Chest x-ray is not ordered for femoral cannulation.  EBL Minimal  Specimen(s) None  Mechele Collin, M.D. [facility] Pulmonary/Critical Care Medicine 12/05/2020 4:44 PM

## 2020-12-05 NOTE — Progress Notes (Signed)
Patient transferred by bed, A&Ox4, on tele and o2 to 2H room 1.  Nurse Vance Peper at bedside.

## 2020-12-05 NOTE — Progress Notes (Signed)
Remains in A Fib.  TEE DC-CV set up for 12/06/20 at 0730   Keondria Siever NP-C 8:44 AM

## 2020-12-06 ENCOUNTER — Other Ambulatory Visit (HOSPITAL_COMMUNITY): Payer: Medicare Other

## 2020-12-06 DIAGNOSIS — I5043 Acute on chronic combined systolic (congestive) and diastolic (congestive) heart failure: Secondary | ICD-10-CM | POA: Diagnosis not present

## 2020-12-06 LAB — RENAL FUNCTION PANEL
Albumin: 3.1 g/dL — ABNORMAL LOW (ref 3.5–5.0)
Albumin: 3 g/dL — ABNORMAL LOW (ref 3.5–5.0)
Anion gap: 7 (ref 5–15)
Anion gap: 5 (ref 5–15)
BUN: 56 mg/dL — ABNORMAL HIGH (ref 8–23)
BUN: 38 mg/dL — ABNORMAL HIGH (ref 8–23)
CO2: 27 mmol/L (ref 22–32)
CO2: 30 mmol/L (ref 22–32)
Calcium: 8.8 mg/dL — ABNORMAL LOW (ref 8.9–10.3)
Calcium: 8.9 mg/dL (ref 8.9–10.3)
Chloride: 99 mmol/L (ref 98–111)
Chloride: 101 mmol/L (ref 98–111)
Creatinine, Ser: 2.88 mg/dL — ABNORMAL HIGH (ref 0.61–1.24)
Creatinine, Ser: 1.98 mg/dL — ABNORMAL HIGH (ref 0.61–1.24)
GFR, Estimated: 23 mL/min — ABNORMAL LOW (ref >60)
GFR, Estimated: 37 mL/min — ABNORMAL LOW (ref >60)
Glucose, Bld: 129 mg/dL — ABNORMAL HIGH (ref 70–99)
Glucose, Bld: 129 mg/dL — ABNORMAL HIGH (ref 70–99)
Phosphorus: 4.1 mg/dL (ref 2.5–4.6)
Phosphorus: 3.2 mg/dL (ref 2.5–4.6)
Potassium: 4.5 mmol/L (ref 3.5–5.1)
Potassium: 4.6 mmol/L (ref 3.5–5.1)
Sodium: 133 mmol/L — ABNORMAL LOW (ref 135–145)
Sodium: 136 mmol/L (ref 135–145)

## 2020-12-06 LAB — CBC
HCT: 34.4 % — ABNORMAL LOW (ref 39.0–52.0)
Hemoglobin: 10.9 g/dL — ABNORMAL LOW (ref 13.0–17.0)
MCH: 23.1 pg — ABNORMAL LOW (ref 26.0–34.0)
MCHC: 31.7 g/dL (ref 30.0–36.0)
MCV: 73 fL — ABNORMAL LOW (ref 80.0–100.0)
Platelets: 197 10*3/uL (ref 150–400)
RBC: 4.71 MIL/uL (ref 4.22–5.81)
RDW: 17.2 % — ABNORMAL HIGH (ref 11.5–15.5)
WBC: 9.8 10*3/uL (ref 4.0–10.5)
nRBC: 2.2 % — ABNORMAL HIGH (ref 0.0–0.2)

## 2020-12-06 LAB — COOXEMETRY PANEL
Carboxyhemoglobin: 1 % (ref 0.5–1.5)
Carboxyhemoglobin: 1.1 % (ref 0.5–1.5)
Methemoglobin: 1.3 % (ref 0.0–1.5)
Methemoglobin: 1.4 % (ref 0.0–1.5)
O2 Saturation: 60.8 %
O2 Saturation: 69.4 %
Total hemoglobin: 11.2 g/dL — ABNORMAL LOW (ref 12.0–16.0)
Total hemoglobin: 11.4 g/dL — ABNORMAL LOW (ref 12.0–16.0)

## 2020-12-06 LAB — GLUCOSE, CAPILLARY
Glucose-Capillary: 115 mg/dL — ABNORMAL HIGH (ref 70–99)
Glucose-Capillary: 125 mg/dL — ABNORMAL HIGH (ref 70–99)
Glucose-Capillary: 130 mg/dL — ABNORMAL HIGH (ref 70–99)
Glucose-Capillary: 125 mg/dL — ABNORMAL HIGH (ref 70–99)

## 2020-12-06 LAB — HEPARIN LEVEL (UNFRACTIONATED): Heparin Unfractionated: 0.44 IU/mL (ref 0.30–0.70)

## 2020-12-06 LAB — MAGNESIUM: Magnesium: 2.3 mg/dL (ref 1.7–2.4)

## 2020-12-06 NOTE — Progress Notes (Signed)
Subjective:  Started on CRRT late yesterday-  Tolerating close to 100 per hour removal-  Still in rapid a fib-  Did make about 350 of urine-  Still on lasix drip.  Cards wants to wait until more volume off before cardioversion but not likely to happen until Monday   Objective Vital signs in last 24 hours: Vitals:   12/06/20 0630 12/06/20 0645 12/06/20 0700 12/06/20 0715  BP:   104/78   Pulse: (!) 144 84 (!) 134 (!) 135  Resp: (!) 21 13 13 12   Temp:    97.6 F (36.4 C)  TempSrc:      SpO2: 94% 95% 92% 95%  Weight:      Height:       Weight change:   Intake/Output Summary (Last 24 hours) at 12/06/2020 0737 Last data filed at 12/06/2020 0700 Gross per 24 hour  Intake 2283.04 ml  Output 2143 ml  Net 140.04 ml    Assessment/ Plan: Pt is a 65 y.o. yo male -  crt 1.11 a year ago, who was admitted on 12/02/2020 with Afib and volume overload.   Initial crt 1.7 but increased in the setting of attempted diuresis and continued hemodyanamic instability  -  CRRT started on 5/12  1. Afib/cardiomyopathy/volume overload-  HF team involved-  Milrinone/ amio/ and norepi 2. Renal-  Last year crt 1.1-  Was 1.7 this hosp in the setting of above-  Increased with attempt at diuresis and hemodynamic instability.  CRRT started on 5/12 mostly for volume removal-  Have been able to achieve negative 50-100 per hour so far.  To continue.  But, his renal function should be salvageable so do not want to push too hard as to cause more severe ATN.  K and phos OK -  No adjustments needed 3. Anemia- reasonable-  Systemic heparin but no specific heparin with CRRT 4. HTN/volume- massive volume overload-  Trying to remove 50-100 per hour.  Multiple drips for cardiac purposes.  Was on lasix drip-  Still making a little urine but will go ahead and d/c lasix drip for now   7/12    Labs: Basic Metabolic Panel: Recent Labs  Lab 12/05/20 0426 12/05/20 1851 12/06/20 0433  NA 133* 134* 133*  K 4.6 4.5  4.5  CL 96* 98 99  CO2 30 29 27   GLUCOSE 266* 135* 129*  BUN 69* 73* 56*  CREATININE 3.49* 3.84* 2.88*  CALCIUM 8.6* 9.0 8.8*  PHOS  --  5.9* 4.1   Liver Function Tests: Recent Labs  Lab 12/02/20 0914 12/05/20 1851 12/06/20 0433  AST 19  --   --   ALT 17  --   --   ALKPHOS 104  --   --   BILITOT 2.4*  --   --   PROT 7.3  --   --   ALBUMIN 3.6 3.0* 3.1*   No results for input(s): LIPASE, AMYLASE in the last 168 hours. No results for input(s): AMMONIA in the last 168 hours. CBC: Recent Labs  Lab 12/02/20 0914 12/03/20 0415 12/04/20 0305 12/05/20 0426 12/06/20 0433  WBC 9.3 9.4 9.9 8.8 9.8  NEUTROABS 5.9  --   --   --   --   HGB 13.1 12.4* 11.3* 9.8* 10.9*  HCT 42.6 41.4 37.8* 32.6* 34.4*  MCV 75.4* 76.7* 77.1* 76.7* 73.0*  PLT 255 254 231 197 197   Cardiac Enzymes: No results for input(s): CKTOTAL, CKMB, CKMBINDEX, TROPONINI in the last 168  hours. CBG: Recent Labs  Lab 12/04/20 2122 12/05/20 0640 12/05/20 1103 12/05/20 2142 12/06/20 0724  GLUCAP 136* 118* 174* 184* 115*    Iron Studies:  Recent Labs    12/05/20 0930  IRON 37*  TIBC 412  FERRITIN 23*   Studies/Results: US RENAL  Result Date: 12/04/2020 CLINICAL DATA:  Hematuria EXAM: RENAL / URINARY TRACT ULTRASOUND COMPLETE COMPARISON:  None. FINDINGS: Right Kidney: Renal measurements: 7.9 x 5.4 x 6.0 cm = volume: 120.0 mL. Echogenicity and renal cortical thickness are within normal limits. No mass, perinephric fluid, or hydronephrosis visualized. No sonographically demonstrable calculus or ureterectasis. Left Kidney: Renal measurements: 9.2 x 5.9 x 5.8 cm = volume: 164.2 mL. Echogenicity and renal cortical thickness within normal limits. No mass, perinephric fluid, or hydronephrosis visualized. No sonographically demonstrable calculus or ureterectasis. Bladder: Appears normal for degree of bladder distention. Other: None. IMPRESSION: Kidneys rather small with right kidney smaller than left kidney.  Etiology for small right kidney uncertain. Renal artery stenosis could account for this finding. By report, patient is hypertensive. No obstructing focus in either kidney. The renal echogenicity and cortical thickness bilaterally are within normal limits. Comment: A cause for hematuria has not been established with this study. Electronically Signed   By: Bretta Bang III M.D.   On: 12/04/2020 15:30   DG Chest Portable 1 View  Result Date: 12/05/2020 CLINICAL DATA:  Post dialysis catheter EXAM: PORTABLE CHEST 1 VIEW COMPARISON:  12/04/2020, 12/03/2020 FINDINGS: Left-sided central venous catheter with tip over the upper SVC. Right upper extremity central venous catheter over the cavoatrial region. Similar moderate right pleural effusion and small left pleural effusion. Enlarged cardiomediastinal silhouette with vascular congestion. Right greater than left airspace disease. No pneumothorax IMPRESSION: 1. New left IJ central venous catheter tip over the upper SVC without left pneumothorax 2. Similar moderate right and small left pleural effusions with basilar airspace disease 3. Cardiomegaly with vascular congestion and probable mild interstitial edema Electronically Signed   By: Jasmine Pang M.D.   On: 12/05/2020 17:18   DG Chest Port 1 View  Result Date: 12/04/2020 CLINICAL DATA:  PICC EXAM: PORTABLE CHEST 1 VIEW COMPARISON:  12/03/2020 FINDINGS: Interval placement of right upper extremity PICC, tip position near the superior cavoatrial junction. Unchanged moderate to large right, small left pleural effusions and associated atelectasis or consolidation. No new airspace opacity. Cardiomegaly. IMPRESSION: 1. Interval placement of right upper extremity PICC, tip position near the superior cavoatrial junction. 2. Unchanged moderate to large right, small left pleural effusions and associated atelectasis or consolidation. Electronically Signed   By: Lauralyn Primes M.D.   On: 12/04/2020 12:30   [place] EKG SITE  RITE  Result Date: 12/04/2020 If Site Rite image not attached, placement could not be confirmed due to current cardiac rhythm.  Medications: Infusions: .  prismasol BGK 4/2.5 500 mL/hr at 12/06/20 0440  .  prismasol BGK 4/2.5 300 mL/hr at 12/05/20 1827  . sodium chloride    . sodium chloride    . sodium chloride    . amiodarone 60 mg/hr (12/06/20 0700)  . furosemide (LASIX) 200 mg in dextrose 5% 100 mL (2mg /mL) infusion 15 mg/hr (12/06/20 0700)  . heparin 1,350 Units/hr (12/06/20 0700)  . milrinone 0.25 mcg/kg/min (12/06/20 0700)  . norepinephrine (LEVOPHED) Adult infusion 16 mcg/min (12/06/20 0700)  . prismasol BGK 4/2.5 1,500 mL/hr at 12/06/20 0439    Scheduled Medications: . atorvastatin  10 mg Oral Daily  . Chlorhexidine Gluconate Cloth  6 each Topical Daily  .  insulin aspart  0-5 Units Subcutaneous QHS  . insulin aspart  0-9 Units Subcutaneous TID WC  . loratadine  10 mg Oral Daily  . montelukast  10 mg Oral QHS  . sodium chloride flush  10-40 mL Intracatheter Q12H  . sodium chloride flush  3 mL Intravenous Q12H    have reviewed scheduled and prn medications.  Physical Exam: General: somnolent but arousable Heart: tachy, irreg Lungs: dec BS at bases Abdomen: obese, non tender Extremities: pitting edema throughout Dialysis Access: left IJ cath placed 5/12 by CCM    12/06/2020,7:37 AM  LOS: 4 days

## 2020-12-06 NOTE — Progress Notes (Signed)
Patient ID: Kevin [name], male   DOB: 05-29-1956, 65 y.o.   MRN: [id number]     Advanced Heart Failure Rounding Note  PCP-Cardiologist: Chrystie Nose, MD   Subjective:    CVVH started 5/12 with intractable volume overload.  Currently pulling up to 100 cc/hr net negative. He remains on milrinone 0.25 + NE 16 with co-ox 61%, last lactate 0.6.  CVP 18-20 today.   Atrial fibrillation rate 120s on amiodarone gtt 60 mg/hr.   Got feraheme yesterday, Hgb 10.9.    Objective:   Weight Range: 131.4 kg Body mass index is 38.22 kg/m.   Vital Signs:   Temp:  [97.6 F (36.4 C)-98.4 F (36.9 C)] 97.6 F (36.4 C) (05/13 0715) Pulse Rate:  [45-146] 135 (05/13 0715) Resp:  [9-27] 12 (05/13 0715) BP: (73-138)/(46-118) 104/78 (05/13 0700) SpO2:  [90 %-100 %] 95 % (05/13 0715) Arterial Line BP: (83-124)/(38-81) 105/62 (05/13 0715) Last BM Date: 12/02/20  Weight change: Filed Weights   12/03/20 0508 12/04/20 0614 12/05/20 0408  Weight: 128.5 kg 131.6 kg 131.4 kg    Intake/Output:   Intake/Output Summary (Last 24 hours) at 12/06/2020 0741 Last data filed at 12/06/2020 0700 Gross per 24 hour  Intake 2283.04 ml  Output 2143 ml  Net 140.04 ml      Physical Exam    General: NAD Neck: JVP 16+, no thyromegaly or thyroid nodule.  Lungs: Clear to auscultation bilaterally with normal respiratory effort. CV: Nonpalpable PMI.  Heart tachy, irregular S1/S2, no S3/S4, no murmur.  2+ edema to thighs. Abdomen: Soft, nontender, no hepatosplenomegaly, no distention.  Skin: Intact without lesions or rashes.  Neurologic: Alert and oriented x 3.  Psych: Normal affect. Extremities: No clubbing or cyanosis.  HEENT: Normal.    Telemetry   Atrial fibrillation rate 120s (personally reviewed)  Labs    CBC Recent Labs    12/05/20 0426 12/06/20 0433  WBC 8.8 9.8  HGB 9.8* 10.9*  HCT 32.6* 34.4*  MCV 76.7* 73.0*  PLT 197 197   Basic Metabolic Panel Recent Labs    82/42/35 0426  12/05/20 1851 12/06/20 0433  NA 133* 134* 133*  K 4.6 4.5 4.5  CL 96* 98 99  CO2 30 29 27   GLUCOSE 266* 135* 129*  BUN 69* 73* 56*  CREATININE 3.49* 3.84* 2.88*  CALCIUM 8.6* 9.0 8.8*  MG 2.3  --  2.3  PHOS  --  5.9* 4.1   Liver Function Tests Recent Labs    12/05/20 1851 12/06/20 0433  ALBUMIN 3.0* 3.1*   No results for input(s): LIPASE, AMYLASE in the last 72 hours. Cardiac Enzymes No results for input(s): CKTOTAL, CKMB, CKMBINDEX, TROPONINI in the last 72 hours.  BNP: BNP (last 3 results) Recent Labs    12/02/20 0915  BNP 1,158.5*    ProBNP (last 3 results) No results for input(s): PROBNP in the last 8760 hours.   D-Dimer No results for input(s): DDIMER in the last 72 hours. Hemoglobin A1C No results for input(s): HGBA1C in the last 72 hours. Fasting Lipid Panel Recent Labs    12/05/20 0426  CHOL 68  HDL 23*  LDLCALC 35  TRIG 48  CHOLHDL 3.0   Thyroid Function Tests No results for input(s): TSH, T4TOTAL, T3FREE, THYROIDAB in the last 72 hours.  Invalid input(s): FREET3  Other results:   Imaging    DG Chest Portable 1 View  Result Date: 12/05/2020 CLINICAL DATA:  Post dialysis catheter EXAM: PORTABLE CHEST 1 VIEW COMPARISON:  12/04/2020, 12/03/2020 FINDINGS: Left-sided central venous catheter with tip over the upper SVC. Right upper extremity central venous catheter over the cavoatrial region. Similar moderate right pleural effusion and small left pleural effusion. Enlarged cardiomediastinal silhouette with vascular congestion. Right greater than left airspace disease. No pneumothorax IMPRESSION: 1. New left IJ central venous catheter tip over the upper SVC without left pneumothorax 2. Similar moderate right and small left pleural effusions with basilar airspace disease 3. Cardiomegaly with vascular congestion and probable mild interstitial edema Electronically Signed   By: Jasmine Pang M.D.   On: 12/05/2020 17:18     Medications:      Scheduled Medications: . atorvastatin  10 mg Oral Daily  . Chlorhexidine Gluconate Cloth  6 each Topical Daily  . insulin aspart  0-5 Units Subcutaneous QHS  . insulin aspart  0-9 Units Subcutaneous TID WC  . loratadine  10 mg Oral Daily  . montelukast  10 mg Oral QHS  . sodium chloride flush  10-40 mL Intracatheter Q12H  . sodium chloride flush  3 mL Intravenous Q12H    Infusions: .  prismasol BGK 4/2.5 500 mL/hr at 12/06/20 0440  .  prismasol BGK 4/2.5 300 mL/hr at 12/05/20 1827  . sodium chloride    . sodium chloride    . sodium chloride    . amiodarone 60 mg/hr (12/06/20 0700)  . heparin 1,350 Units/hr (12/06/20 0700)  . milrinone 0.25 mcg/kg/min (12/06/20 0700)  . norepinephrine (LEVOPHED) Adult infusion 16 mcg/min (12/06/20 0700)  . prismasol BGK 4/2.5 1,500 mL/hr at 12/06/20 0439    PRN Medications: sodium chloride, Place/Maintain arterial line **AND** sodium chloride, acetaminophen, heparin, ondansetron (ZOFRAN) IV, sodium chloride flush, sodium chloride flush    Assessment/Plan   1. Acute on chronic systolic CHF: Nonischemic cardiomyopathy.  Echo this admission with EF 25-30%, moderate LV dilation, mild LVH, moderate RVE with moderately decreased RV systolic function, severe biatrial enlargement, moderate-severe TR, IVC dilated. EF was low starting in 2016, cath at that time showed mild nonobstructive CAD.  Patient had runs of AF but not sustained when initial diagnosis was made.  Was readmitted in 2018 with persistent AF and CHF, echo with EF 20-25%.  Had DCCV back to NSR at that time.  EF up to 55-60% in 2019.  Lost to followup, now back with progressive dyspnea/volume overload and EF back down in setting of AF with RVR.  Difficult to identify initial etiology of CMP but cannot rule out tachy-mediated.  UDS negative for cocaine. 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  He does not feel AF and we are not sure how long he has been in it.  He is  30+ lbs up with marked volume overload and concern for low output HF (progressive rise in creatinine with attempts at diuresis).  Poor response to initiation of milrinone 0.25 then norepinephrine, creatinine continued to rise and CVVH started 5/12.  Pulling about 100 cc/hr net UF this morning with CVP 18-20.  SBP 100s on NE 16 + milrinone 0.125.  Co-ox 61% this morning.   - Continue NE to keep MAP >65, decrease milrinone to 0.125 with tachycardia.   - Stop Lasix gtt with CVVH.  - Would aim to pull net negative UF around 100 cc/hr.  Avoid too aggressive UF in hopes of preserving some renal function going forwards.   - Will need eventual RHC, consider repeat coronary angiography if creatinine improves significantly => HS-TnI slightly elevated with no trend so no ACS.  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   - Ultimately needs TEE-DCCV for conversion to NSR, think he needs fluid off first for this to be successful and suspect if we did this today, he would have to be intubated.  Will aim for Monday. - Consider eventual cardiac MRI.  - Unna boots.  2. Atrial fibrillation: Admitted with afib/RVR, uncertain how long this has been present.  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 Needs conversion back to NSR. - Would like to see better diuresis before DCCV as he is markedly volume overloaded and less likely to hold NSR long-term, also suspect that he would have to be intubated to do procedure today.  Will pull fluid over the weekend and plan for Monday.  - Continue amiodarone gtt 60 mg/hr.  - Continue on heparin gtt, eventually transition to Eliquis.  3. AKI: Uncertain baseline, creatinine 1.77 at arrival, creatinine up to 3.49 with poor diuresis and CVVH started with intractable volume overload. - Aim for UF net 100 cc/hr today.  4. Hematuria: Seen by urology => think old blood in urine, renal ultrasound ok.  Recommend continuing heparin gtt.  Hgb higher today, has had feraheme.   Transfer  to CCU for arterial line, norepinephrine.   CRITICAL CARE Performed by: Marca Ancona  Total critical care time: 40 minutes  Critical care time was exclusive of separately billable procedures and treating other patients.  Critical care was necessary to treat or prevent imminent or life-threatening deterioration.  Critical care was time spent personally by me on the following activities: development of treatment plan with patient and/or surrogate as well as nursing, discussions with consultants, evaluation of patient's response to treatment, examination of patient, obtaining history from patient or surrogate, ordering and performing treatments and interventions, ordering and review of laboratory studies, ordering and review of radiographic studies, pulse oximetry and re-evaluation of patient's condition.   Length of Stay: 4  Marca Ancona, MD  12/06/2020, 7:41 AM  Advanced Heart Failure Team Pager [phone] (M-F; 7a - 5p)  Please contact [facility] for night-coverage after hours (5p -7a ) and weekends on [email or web address]

## 2020-12-06 NOTE — Progress Notes (Signed)
ANTICOAGULATION CONSULT NOTE - Follow Up Consult  Pharmacy Consult for heparin Indication: atrial fibrillation  Labs: Recent Labs    12/03/20 0934 12/03/20 1046 12/04/20 0305 12/04/20 2039 12/05/20 0426 12/05/20 1321 12/05/20 1851 12/06/20 0433  HGB  --    < > 11.3*  --  9.8*  --   --  10.9*  HCT  --   --  37.8*  --  32.6*  --   --  34.4*  PLT  --   --  231  --  197  --   --  197  APTT 105*  --  141* 132*  --   --   --   --   LABPROT  --   --   --   --   --  14.5  --   --   INR  --   --   --   --   --  1.1  --   --   HEPARINUNFRC  --    < > 0.96* 0.60 0.61  --   --  0.44  CREATININE  --    < > 2.35* 3.04* 3.49*  --  3.84* 2.88*   < > = values in this interval not displayed.    Assessment/Plan:  65yo male on heparin for Afib while holding home Eliquis. CBC stable, heparin level therapeutic. TEE-DCCV today.  Goal of Therapy:  Heparin level 0.3-0.7 units/ml Monitor platelets by anticoagulation protocol: Yes  Plan: Continue heparin at 1350 units/h Daily heparin level and CBC  Fredonia Highland, PharmD, [place], [facility] Clinical Pharmacist [phone] Please check AMION for all [facility] numbers 12/06/2020

## 2020-12-07 DIAGNOSIS — I5043 Acute on chronic combined systolic (congestive) and diastolic (congestive) heart failure: Secondary | ICD-10-CM | POA: Diagnosis not present

## 2020-12-07 DIAGNOSIS — I4819 Other persistent atrial fibrillation: Secondary | ICD-10-CM | POA: Diagnosis not present

## 2020-12-07 LAB — MAGNESIUM: Magnesium: 2.3 mg/dL (ref 1.7–2.4)

## 2020-12-07 LAB — RENAL FUNCTION PANEL
Albumin: 2.8 g/dL — ABNORMAL LOW (ref 3.5–5.0)
Albumin: 2.9 g/dL — ABNORMAL LOW (ref 3.5–5.0)
Anion gap: 6 (ref 5–15)
Anion gap: 3 — ABNORMAL LOW (ref 5–15)
BUN: 31 mg/dL — ABNORMAL HIGH (ref 8–23)
BUN: 26 mg/dL — ABNORMAL HIGH (ref 8–23)
CO2: 26 mmol/L (ref 22–32)
CO2: 28 mmol/L (ref 22–32)
Calcium: 8.7 mg/dL — ABNORMAL LOW (ref 8.9–10.3)
Calcium: 8.5 mg/dL — ABNORMAL LOW (ref 8.9–10.3)
Chloride: 103 mmol/L (ref 98–111)
Chloride: 103 mmol/L (ref 98–111)
Creatinine, Ser: 1.86 mg/dL — ABNORMAL HIGH (ref 0.61–1.24)
Creatinine, Ser: 1.63 mg/dL — ABNORMAL HIGH (ref 0.61–1.24)
GFR, Estimated: 39 mL/min — ABNORMAL LOW
GFR, Estimated: 46 mL/min — ABNORMAL LOW (ref >60)
Glucose, Bld: 112 mg/dL — ABNORMAL HIGH (ref 70–99)
Glucose, Bld: 115 mg/dL — ABNORMAL HIGH (ref 70–99)
Phosphorus: 3.3 mg/dL (ref 2.5–4.6)
Phosphorus: 2.9 mg/dL (ref 2.5–4.6)
Potassium: 4.6 mmol/L (ref 3.5–5.1)
Potassium: 4.6 mmol/L (ref 3.5–5.1)
Sodium: 135 mmol/L (ref 135–145)
Sodium: 134 mmol/L — ABNORMAL LOW (ref 135–145)

## 2020-12-07 LAB — CBC
HCT: 32.2 % — ABNORMAL LOW (ref 39.0–52.0)
Hemoglobin: 10.4 g/dL — ABNORMAL LOW (ref 13.0–17.0)
MCH: 23.5 pg — ABNORMAL LOW (ref 26.0–34.0)
MCHC: 32.3 g/dL (ref 30.0–36.0)
MCV: 72.7 fL — ABNORMAL LOW (ref 80.0–100.0)
Platelets: 157 10*3/uL (ref 150–400)
RBC: 4.43 MIL/uL (ref 4.22–5.81)
RDW: 17.4 % — ABNORMAL HIGH (ref 11.5–15.5)
WBC: 7.3 10*3/uL (ref 4.0–10.5)
nRBC: 1 % — ABNORMAL HIGH (ref 0.0–0.2)

## 2020-12-07 LAB — GLUCOSE, CAPILLARY
Glucose-Capillary: 111 mg/dL — ABNORMAL HIGH (ref 70–99)
Glucose-Capillary: 111 mg/dL — ABNORMAL HIGH (ref 70–99)
Glucose-Capillary: 123 mg/dL — ABNORMAL HIGH (ref 70–99)
Glucose-Capillary: 110 mg/dL — ABNORMAL HIGH (ref 70–99)

## 2020-12-07 LAB — HEPARIN LEVEL (UNFRACTIONATED): Heparin Unfractionated: 0.51 IU/mL (ref 0.30–0.70)

## 2020-12-07 LAB — COOXEMETRY PANEL
Carboxyhemoglobin: 1.1 % (ref 0.5–1.5)
Methemoglobin: 1.3 % (ref 0.0–1.5)
O2 Saturation: 60.5 %
Total hemoglobin: 10.7 g/dL — ABNORMAL LOW (ref 12.0–16.0)

## 2020-12-07 MED ORDER — MIDODRINE HCL 5 MG PO TABS
2.5000 mg | ORAL_TABLET | Freq: Three times a day (TID) | ORAL | Status: DC
  Administered 2020-12-07 – 2020-12-08 (×3): 2.5 mg via ORAL
  Filled 2020-12-07 (×3): qty 1

## 2020-12-07 MED ORDER — SORBITOL 70 % SOLN
30.0000 mL | Freq: Once | Status: AC
  Administered 2020-12-07: 30 mL via ORAL
  Filled 2020-12-07: qty 30

## 2020-12-07 NOTE — Plan of Care (Signed)
  Problem: Cardiac: Goal: Ability to achieve and maintain adequate cardiopulmonary perfusion will improve Outcome: Progressing   Problem: Clinical Measurements: Goal: Ability to maintain clinical measurements within normal limits will improve Outcome: Progressing Goal: Will remain free from infection Outcome: Progressing Goal: Diagnostic test results will improve Outcome: Progressing Goal: Respiratory complications will improve Outcome: Progressing Goal: Cardiovascular complication will be avoided Outcome: Progressing   Problem: Activity: Goal: Risk for activity intolerance will decrease Outcome: Progressing   Problem: Pain Managment: Goal: General experience of comfort will improve Outcome: Progressing   Problem: Safety: Goal: Ability to remain free from injury will improve Outcome: Progressing   Problem: Skin Integrity: Goal: Risk for impaired skin integrity will decrease Outcome: Progressing

## 2020-12-07 NOTE — Progress Notes (Signed)
ANTICOAGULATION CONSULT NOTE - Follow Up Consult  Pharmacy Consult for heparin Indication: atrial fibrillation  Labs: Recent Labs    12/04/20 2039 12/04/20 2039 12/05/20 0426 12/05/20 1321 12/05/20 1851 12/06/20 0433 12/06/20 1642 12/07/20 0426  HGB  --    < > 9.8*  --   --  10.9*  --  10.4*  HCT  --   --  32.6*  --   --  34.4*  --  32.2*  PLT  --   --  197  --   --  197  --  157  APTT 132*  --   --   --   --   --   --   --   LABPROT  --   --   --  14.5  --   --   --   --   INR  --   --   --  1.1  --   --   --   --   HEPARINUNFRC 0.60  --  0.61  --   --  0.44  --  0.51  CREATININE 3.04*  --  3.49*  --    < > 2.88* 1.98* 1.86*   < > = values in this interval not displayed.    Assessment/Plan:  65yo male on heparin for Afib while holding home Eliquis. CBC stable, heparin level therapeutic. TEE-DCCV likely Monday.  Goal of Therapy:  Heparin level 0.3-0.7 units/ml Monitor platelets by anticoagulation protocol: Yes  Plan: Continue heparin at 1350 units/h Daily heparin level and CBC  Tama Headings, PharmD, BCPS PGY2 Cardiology Pharmacy Resident Phone: [phone] 12/07/2020  7:38 AM  Please check [email or web address] for unit-specific pharmacy phone numbers.

## 2020-12-07 NOTE — Progress Notes (Signed)
Subjective:  Continues on CRRT late yesterday-  Tolerating close to 100 per hour removal-  Neg 2600 last 24 hours, pressors being weaned. Still in rapid a fib-  Did make about a liter of urine.  Cards wants to wait until more volume off before cardioversion but not likely to happen until Monday   Objective Vital signs in last 24 hours: Vitals:   12/07/20 0515 12/07/20 0530 12/07/20 0545 12/07/20 0600  BP:    (!) 81/67  Pulse: (!) 123 (!) 42 (!) 42 67  Resp: 17 11 12 12   Temp:      TempSrc:      SpO2: 91% 97% 97% 97%  Weight:      Height:       Weight change:   Intake/Output Summary (Last 24 hours) at 12/07/2020 12/09/2020 Last data filed at 12/07/2020 0600 Gross per 24 hour  Intake 1853.43 ml  Output 4713 ml  Net -2859.57 ml    Assessment/ Plan: Pt is a 65 y.o. yo male -  crt 1.11 a year ago, who was admitted on 12/02/2020 with Afib and volume overload.   Initial crt 1.7 but increased in the setting of attempted diuresis and continued hemodyanamic instability  -  CRRT started on 5/12  1. Afib/cardiomyopathy/volume overload-  HF team involved-  Milrinone/ amio/ and norepi 2. Renal-  Last year crt 1.1-  Was 1.7 this hosp in the setting of above-  Increased with attempt at diuresis and hemodynamic instability.  CRRT started on 5/12 mostly for volume removal-  Have been able to achieve negative 100 per hour so far.  To continue.  But, his renal function should be salvageable so do not want to push too hard as to cause more severe ATN.  K and phos OK -  No adjustments or repletion needed 3. Anemia- reasonable-  Systemic heparin but no specific heparin with CRRT 4. HTN/volume- massive volume overload-  Trying to remove 100 per hour.  Multiple drips for cardiac purposes.    Still making a little urine-  CVP still high-  I think OK to go up to 150 per hour for UF-  Trying to be careful with dropping BP too low and giving ATN  7/12    Labs: Basic Metabolic Panel: Recent Labs   Lab 12/06/20 0433 12/06/20 1642 12/07/20 0426  NA 133* 136 135  K 4.5 4.6 4.6  CL 99 101 103  CO2 27 30 26   GLUCOSE 129* 129* 112*  BUN 56* 38* 31*  CREATININE 2.88* 1.98* 1.86*  CALCIUM 8.8* 8.9 8.7*  PHOS 4.1 3.2 3.3   Liver Function Tests: Recent Labs  Lab 12/02/20 0914 12/05/20 1851 12/06/20 0433 12/06/20 1642 12/07/20 0426  AST 19  --   --   --   --   ALT 17  --   --   --   --   ALKPHOS 104  --   --   --   --   BILITOT 2.4*  --   --   --   --   PROT 7.3  --   --   --   --   ALBUMIN 3.6   < > 3.1* 3.0* 2.8*   < > = values in this interval not displayed.   No results for input(s): LIPASE, AMYLASE in the last 168 hours. No results for input(s): AMMONIA in the last 168 hours. CBC: Recent Labs  Lab 12/02/20 0914 12/03/20 0415 12/04/20 0305 12/05/20 0426 12/06/20  1025 12/07/20 0426  WBC 9.3 9.4 9.9 8.8 9.8 7.3  NEUTROABS 5.9  --   --   --   --   --   HGB 13.1 12.4* 11.3* 9.8* 10.9* 10.4*  HCT 42.6 41.4 37.8* 32.6* 34.4* 32.2*  MCV 75.4* 76.7* 77.1* 76.7* 73.0* 72.7*  PLT 255 254 231 197 197 157   Cardiac Enzymes: No results for input(s): CKTOTAL, CKMB, CKMBINDEX, TROPONINI in the last 168 hours. CBG: Recent Labs  Lab 12/05/20 2142 12/06/20 0724 12/06/20 1108 12/06/20 1503 12/06/20 2211  GLUCAP 184* 115* 125* 130* 125*    Iron Studies:  Recent Labs    12/05/20 0930  IRON 37*  TIBC 412  FERRITIN 23*   Studies/Results: DG Chest Portable 1 View  Result Date: 12/05/2020 CLINICAL DATA:  Post dialysis catheter EXAM: PORTABLE CHEST 1 VIEW COMPARISON:  12/04/2020, 12/03/2020 FINDINGS: Left-sided central venous catheter with tip over the upper SVC. Right upper extremity central venous catheter over the cavoatrial region. Similar moderate right pleural effusion and small left pleural effusion. Enlarged cardiomediastinal silhouette with vascular congestion. Right greater than left airspace disease. No pneumothorax IMPRESSION: 1. New left IJ central venous  catheter tip over the upper SVC without left pneumothorax 2. Similar moderate right and small left pleural effusions with basilar airspace disease 3. Cardiomegaly with vascular congestion and probable mild interstitial edema Electronically Signed   By: Jasmine Pang M.D.   On: 12/05/2020 17:18   Medications: Infusions: .  prismasol BGK 4/2.5 500 mL/hr at 12/07/20 0122  .  prismasol BGK 4/2.5 300 mL/hr at 12/07/20 0516  . sodium chloride    . sodium chloride    . sodium chloride    . amiodarone 60 mg/hr (12/07/20 0600)  . heparin 1,350 Units/hr (12/07/20 0600)  . milrinone 0.125 mcg/kg/min (12/07/20 0600)  . norepinephrine (LEVOPHED) Adult infusion 2 mcg/min (12/07/20 0600)  . prismasol BGK 4/2.5 1,500 mL/hr at 12/07/20 0428    Scheduled Medications: . atorvastatin  10 mg Oral Daily  . Chlorhexidine Gluconate Cloth  6 each Topical Daily  . insulin aspart  0-5 Units Subcutaneous QHS  . insulin aspart  0-9 Units Subcutaneous TID WC  . loratadine  10 mg Oral Daily  . montelukast  10 mg Oral QHS  . sodium chloride flush  10-40 mL Intracatheter Q12H  . sodium chloride flush  3 mL Intravenous Q12H    have reviewed scheduled and prn medications.  Physical Exam: General: sleeping this AM Heart: tachy, irreg Lungs: dec BS at bases Abdomen: obese, non tender Extremities: pitting edema throughout Dialysis Access: left IJ cath placed 5/12 by CCM    12/07/2020,6:28 AM  LOS: 5 days

## 2020-12-07 NOTE — Progress Notes (Signed)
Patient ID: Kevin [name], male   DOB: 30-Apr-1956, 65 y.o.   MRN: [id number]     Advanced Heart Failure Rounding Note  PCP-Cardiologist: Chrystie Nose, MD   Subjective:    CVVH started 5/12 with intractable volume overload.  Currently pulling up to 100 cc/hr net negative. Also made about 1L of urine. No weight on chart for past 2 days.   He remains on milrinone 0.25. Off NE. CO-ox 61%   NE 16 with co-ox 61%, last lactate 0.6.  CVP 20 today  Remains in Atrial fibrillation rate 110-120s on amiodarone gtt 60 mg/hr.   Denies SOB, orthopnea or PND. Feels weak.   Objective:   Weight Range: 131.4 kg Body mass index is 38.22 kg/m.   Vital Signs:   Temp:  [97.4 F (36.3 C)-98.8 F (37.1 C)] 98.7 F (37.1 C) (05/14 0635) Pulse Rate:  [35-146] 67 (05/14 0800) Resp:  [9-32] 15 (05/14 0800) BP: (81-121)/(64-100) 103/68 (05/14 0800) SpO2:  [88 %-99 %] 96 % (05/14 0800) Arterial Line BP: (72-134)/(45-89) 95/45 (05/14 0815) Last BM Date: 12/02/20  Weight change: Filed Weights   12/03/20 0508 12/04/20 0614 12/05/20 0408  Weight: 128.5 kg 131.6 kg 131.4 kg    Intake/Output:   Intake/Output Summary (Last 24 hours) at 12/07/2020 0847 Last data filed at 12/07/2020 0800 Gross per 24 hour  Intake 1855.57 ml  Output 4674 ml  Net -2818.43 ml      Physical Exam    General:  Sitting up in bed No resp difficulty HEENT: normal Neck: supple. IJ HD cath Carotids 2+ bilat; no bruits. No lymphadenopathy or thryomegaly appreciated. Cor: PMI nondisplaced. Irregular tachy No rubs, gallops or murmurs. Lungs: clear Abdomen:obese  soft, nontender, nondistended. No hepatosplenomegaly. No bruits or masses. Good bowel sounds. Extremities: no cyanosis, clubbing, rash, 3+ edema Neuro: alert & orientedx3, cranial nerves grossly intact. moves all 4 extremities w/o difficulty. Affect pleasant   Telemetry   Atrial fibrillation rate 110-120s Personally reviewed  Labs    CBC Recent Labs     12/06/20 0433 12/07/20 0426  WBC 9.8 7.3  HGB 10.9* 10.4*  HCT 34.4* 32.2*  MCV 73.0* 72.7*  PLT 197 157   Basic Metabolic Panel Recent Labs    46/56/81 0433 12/06/20 1642 12/07/20 0426  NA 133* 136 135  K 4.5 4.6 4.6  CL 99 101 103  CO2 27 30 26   GLUCOSE 129* 129* 112*  BUN 56* 38* 31*  CREATININE 2.88* 1.98* 1.86*  CALCIUM 8.8* 8.9 8.7*  MG 2.3  --  2.3  PHOS 4.1 3.2 3.3   Liver Function Tests Recent Labs    12/06/20 1642 12/07/20 0426  ALBUMIN 3.0* 2.8*   No results for input(s): LIPASE, AMYLASE in the last 72 hours. Cardiac Enzymes No results for input(s): CKTOTAL, CKMB, CKMBINDEX, TROPONINI in the last 72 hours.  BNP: BNP (last 3 results) Recent Labs    12/02/20 0915  BNP 1,158.5*    ProBNP (last 3 results) No results for input(s): PROBNP in the last 8760 hours.   D-Dimer No results for input(s): DDIMER in the last 72 hours. Hemoglobin A1C No results for input(s): HGBA1C in the last 72 hours. Fasting Lipid Panel Recent Labs    12/05/20 0426  CHOL 68  HDL 23*  LDLCALC 35  TRIG 48  CHOLHDL 3.0   Thyroid Function Tests No results for input(s): TSH, T4TOTAL, T3FREE, THYROIDAB in the last 72 hours.  Invalid input(s): FREET3  Other results:   Imaging  No results found.   Medications:     Scheduled Medications: . atorvastatin  10 mg Oral Daily  . Chlorhexidine Gluconate Cloth  6 each Topical Daily  . insulin aspart  0-5 Units Subcutaneous QHS  . insulin aspart  0-9 Units Subcutaneous TID WC  . loratadine  10 mg Oral Daily  . montelukast  10 mg Oral QHS  . sodium chloride flush  10-40 mL Intracatheter Q12H  . sodium chloride flush  3 mL Intravenous Q12H    Infusions: .  prismasol BGK 4/2.5 500 mL/hr at 12/07/20 0122  .  prismasol BGK 4/2.5 300 mL/hr at 12/07/20 0516  . sodium chloride    . sodium chloride    . sodium chloride    . amiodarone 60 mg/hr (12/07/20 0800)  . heparin 1,350 Units/hr (12/07/20 0800)  . milrinone  0.125 mcg/kg/min (12/07/20 0800)  . norepinephrine (LEVOPHED) Adult infusion Stopped (12/07/20 0715)  . prismasol BGK 4/2.5 1,500 mL/hr at 12/07/20 0428    PRN Medications: sodium chloride, Place/Maintain arterial line **AND** sodium chloride, acetaminophen, heparin, ondansetron (ZOFRAN) IV, sodium chloride flush, sodium chloride flush    Assessment/Plan   1. Acute on chronic systolic CHF: Nonischemic cardiomyopathy.  Echo this admission with EF 25-30%, moderate LV dilation, mild LVH, moderate RVE with moderately decreased RV systolic function, severe biatrial enlargement, moderate-severe TR, IVC dilated. EF was low starting in 2016, cath at that time showed mild nonobstructive CAD.  Patient had runs of AF but not sustained when initial diagnosis was made.  Was readmitted in 2018 with persistent AF and CHF, echo with EF 20-25%.  Had DCCV back to NSR at that time.  EF up to 55-60% in 2019.  Lost to followup, now back with progressive dyspnea/volume overload and EF back down in setting of AF with RVR.  Difficult to identify initial etiology of CMP but cannot rule out tachy-mediated.  UDS negative for cocaine. 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  He does not feel AF and we are not sure how long he has been in it.  He is 30+ lbs up with marked volume overload and concern for low output HF (progressive rise in creatinine with attempts at diuresis).  Poor response to initiation of milrinone 0.25 then norepinephrine, creatinine continued to rise and CVVH started 5/12.  Remains massively volume overloaded. CVP 20. Renal increasing CVVHD to 150/hr - Off NE now. Can restart to keep MAP >65 (or use midodrine), continue milrinone 0.125 - Will need eventual RHC, consider repeat coronary angiography if creatinine improves significantly => HS-TnI slightly elevated with no trend so no ACS.  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   - Ultimately needs TEE-DCCV for conversion  to NSR, think he needs fluid off first for this to be successful and suspect if we did this today, he would have to be intubated.  Current plan for Monday Monday. - Consider eventual cardiac MRI.  - Unna boots.  2. Atrial fibrillation: Admitted with afib/RVR, uncertain how long this has been present.  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 Needs conversion back to NSR. - Would like to see better diuresis before DCCV as he is markedly volume overloaded and less likely to hold NSR long-term, also suspect that he would have to be intubated to do procedure today.  Will pull fluid over the weekend and plan for Monday.  - Continue amiodarone gtt 60 mg/hr.  - Continue on heparin gtt, eventually transition to Eliquis.  3. AKI: Uncertain baseline,  creatinine 1.77 at arrival, creatinine up to 3.49 with poor diuresis and CVVH started with intractable volume overload. - Aim for UF net 150 cc/hr today per Renal 4. Hematuria: Seen by urology => think old blood in urine, renal ultrasound ok.  Recommend continuing heparin gtt.  Hgb higher today, has had feraheme.  5. Constipation - sorbitol 6. Iron-deficieny anemia - will need feraheme  CRITICAL CARE Performed by: Arvilla Meres  Total critical care time: 35 minutes  Critical care time was exclusive of separately billable procedures and treating other patients.  Critical care was necessary to treat or prevent imminent or life-threatening deterioration.  Critical care was time spent personally by me on the following activities: development of treatment plan with patient and/or surrogate as well as nursing, discussions with consultants, evaluation of patient's response to treatment, examination of patient, obtaining history from patient or surrogate, ordering and performing treatments and interventions, ordering and review of laboratory studies, ordering and review of radiographic studies, pulse oximetry and re-evaluation of patient's condition.   Length  of Stay: 5  Arvilla Meres, MD  12/07/2020, 8:47 AM  Advanced Heart Failure Team Pager [phone] (M-F; 7a - 5p)  Please contact [facility] for night-coverage after hours (5p -7a ) and weekends on [email or web address]

## 2020-12-08 DIAGNOSIS — I4819 Other persistent atrial fibrillation: Secondary | ICD-10-CM | POA: Diagnosis not present

## 2020-12-08 DIAGNOSIS — I5043 Acute on chronic combined systolic (congestive) and diastolic (congestive) heart failure: Secondary | ICD-10-CM | POA: Diagnosis not present

## 2020-12-08 LAB — RENAL FUNCTION PANEL
Albumin: 2.9 g/dL — ABNORMAL LOW (ref 3.5–5.0)
Albumin: 2.8 g/dL — ABNORMAL LOW (ref 3.5–5.0)
Anion gap: 4 — ABNORMAL LOW (ref 5–15)
Anion gap: 6 (ref 5–15)
BUN: 21 mg/dL (ref 8–23)
BUN: 18 mg/dL (ref 8–23)
CO2: 27 mmol/L (ref 22–32)
CO2: 27 mmol/L (ref 22–32)
Calcium: 8.7 mg/dL — ABNORMAL LOW (ref 8.9–10.3)
Calcium: 8.8 mg/dL — ABNORMAL LOW (ref 8.9–10.3)
Chloride: 102 mmol/L (ref 98–111)
Chloride: 102 mmol/L (ref 98–111)
Creatinine, Ser: 1.56 mg/dL — ABNORMAL HIGH (ref 0.61–1.24)
Creatinine, Ser: 1.52 mg/dL — ABNORMAL HIGH (ref 0.61–1.24)
GFR, Estimated: 49 mL/min — ABNORMAL LOW (ref >60)
GFR, Estimated: 50 mL/min — ABNORMAL LOW (ref >60)
Glucose, Bld: 109 mg/dL — ABNORMAL HIGH (ref 70–99)
Glucose, Bld: 83 mg/dL (ref 70–99)
Phosphorus: 2.7 mg/dL (ref 2.5–4.6)
Phosphorus: 3.7 mg/dL (ref 2.5–4.6)
Potassium: 4.5 mmol/L (ref 3.5–5.1)
Potassium: 4.6 mmol/L (ref 3.5–5.1)
Sodium: 133 mmol/L — ABNORMAL LOW (ref 135–145)
Sodium: 135 mmol/L (ref 135–145)

## 2020-12-08 LAB — GLUCOSE, CAPILLARY
Glucose-Capillary: 103 mg/dL — ABNORMAL HIGH (ref 70–99)
Glucose-Capillary: 227 mg/dL — ABNORMAL HIGH (ref 70–99)
Glucose-Capillary: 105 mg/dL — ABNORMAL HIGH (ref 70–99)
Glucose-Capillary: 87 mg/dL (ref 70–99)
Glucose-Capillary: 118 mg/dL — ABNORMAL HIGH (ref 70–99)

## 2020-12-08 LAB — COOXEMETRY PANEL
Carboxyhemoglobin: 1.2 % (ref 0.5–1.5)
Methemoglobin: 1.1 % (ref 0.0–1.5)
O2 Saturation: 69.6 %
Total hemoglobin: 11 g/dL — ABNORMAL LOW (ref 12.0–16.0)

## 2020-12-08 LAB — HEPARIN LEVEL (UNFRACTIONATED): Heparin Unfractionated: 0.57 IU/mL (ref 0.30–0.70)

## 2020-12-08 LAB — MAGNESIUM: Magnesium: 2.4 mg/dL (ref 1.7–2.4)

## 2020-12-08 MED ORDER — SODIUM CHLORIDE 0.9 % IV SOLN
510.0000 mg | Freq: Once | INTRAVENOUS | Status: AC
  Administered 2020-12-08: 510 mg via INTRAVENOUS
  Filled 2020-12-08: qty 17

## 2020-12-08 MED ORDER — SORBITOL 70 % SOLN
30.0000 mL | Freq: Once | Status: AC
  Administered 2020-12-08: 30 mL via ORAL
  Filled 2020-12-08: qty 30

## 2020-12-08 MED ORDER — SODIUM PHOSPHATES 45 MMOLE/15ML IV SOLN
30.0000 mmol | Freq: Once | INTRAVENOUS | Status: AC
  Administered 2020-12-08: 30 mmol via INTRAVENOUS
  Filled 2020-12-08: qty 10

## 2020-12-08 MED ORDER — MIDODRINE HCL 5 MG PO TABS
5.0000 mg | ORAL_TABLET | Freq: Three times a day (TID) | ORAL | Status: DC
  Administered 2020-12-08 (×2): 5 mg via ORAL
  Filled 2020-12-08 (×2): qty 1

## 2020-12-08 NOTE — Progress Notes (Signed)
Patient ID: Kevin [name], male   DOB: 1956-01-12, 65 y.o.   MRN: [id number]     Advanced Heart Failure Rounding Note  PCP-Cardiologist: Chrystie Nose, MD   Subjective:    CVVH started 5/12 with intractable volume overload.    He remains on milrinone 0.125. On NE at 1. Co-ox 70%. Currently pulling 150 cc/hr net negative. Was out about 5L yesterday.  Also made about 300cc of urine.  CVP 16  Remains in Atrial fibrillation rate 100-115s on amiodarone gtt 60 mg/hr.   Denies SOB, orthopnea or PND. No BM.   Objective:   Weight Range: 127.4 kg Body mass index is 37.06 kg/m.   Vital Signs:   Temp:  [97.4 F (36.3 C)-98.4 F (36.9 C)] 97.6 F (36.4 C) (05/15 0745) Pulse Rate:  [32-126] 108 (05/15 0900) Resp:  [10-26] 13 (05/15 0900) BP: (74-126)/(52-109) 101/70 (05/15 0900) SpO2:  [83 %-99 %] 94 % (05/15 0900) Arterial Line BP: (79-140)/(40-76) 130/64 (05/15 0900) Weight:  [127.4 kg] 127.4 kg (05/15 0257) Last BM Date: 12/02/20  Weight change: Filed Weights   12/04/20 0614 12/05/20 0408 12/08/20 0257  Weight: 131.6 kg 131.4 kg 127.4 kg    Intake/Output:   Intake/Output Summary (Last 24 hours) at 12/08/2020 0928 Last data filed at 12/08/2020 0900 Gross per 24 hour  Intake 1602.36 ml  Output 4937 ml  Net -3334.64 ml      Physical Exam    General:  Sitting up in bed. No resp difficulty  HEENT: normal Neck: supple. LIJ HD cath  Carotids 2+ bilat; no bruits. No lymphadenopathy or thryomegaly appreciated. Cor: PMI nondisplaced. Irregular tachy No rubs, gallops or murmurs. Lungs: clear Abdomen: obese soft, nontender, nondistended. No hepatosplenomegaly. No bruits or masses. Good bowel sounds. Extremities: no cyanosis, clubbing, rash, 2+ edema + UNNA Neuro: alert & orientedx3, cranial nerves grossly intact. moves all 4 extremities w/o difficulty. Affect pleasant  Telemetry   Atrial fibrillation rate 100-115s Personally reviewed  Labs    CBC Recent Labs     12/06/20 0433 12/07/20 0426  WBC 9.8 7.3  HGB 10.9* 10.4*  HCT 34.4* 32.2*  MCV 73.0* 72.7*  PLT 197 157   Basic Metabolic Panel Recent Labs    63/89/37 0426 12/07/20 1506 12/08/20 0352  NA 135 134* 133*  K 4.6 4.6 4.5  CL 103 103 102  CO2 26 28 27   GLUCOSE 112* 115* 109*  BUN 31* 26* 21  CREATININE 1.86* 1.63* 1.56*  CALCIUM 8.7* 8.5* 8.7*  MG 2.3  --  2.4  PHOS 3.3 2.9 2.7   Liver Function Tests Recent Labs    12/07/20 1506 12/08/20 0352  ALBUMIN 2.9* 2.9*   No results for input(s): LIPASE, AMYLASE in the last 72 hours. Cardiac Enzymes No results for input(s): CKTOTAL, CKMB, CKMBINDEX, TROPONINI in the last 72 hours.  BNP: BNP (last 3 results) Recent Labs    12/02/20 0915  BNP 1,158.5*    ProBNP (last 3 results) No results for input(s): PROBNP in the last 8760 hours.   D-Dimer No results for input(s): DDIMER in the last 72 hours. Hemoglobin A1C No results for input(s): HGBA1C in the last 72 hours. Fasting Lipid Panel No results for input(s): CHOL, HDL, LDLCALC, TRIG, CHOLHDL, LDLDIRECT in the last 72 hours. Thyroid Function Tests No results for input(s): TSH, T4TOTAL, T3FREE, THYROIDAB in the last 72 hours.  Invalid input(s): FREET3  Other results:   Imaging    No results found.   Medications:  Scheduled Medications: . atorvastatin  10 mg Oral Daily  . Chlorhexidine Gluconate Cloth  6 each Topical Daily  . insulin aspart  0-5 Units Subcutaneous QHS  . insulin aspart  0-9 Units Subcutaneous TID WC  . loratadine  10 mg Oral Daily  . midodrine  2.5 mg Oral TID WC  . montelukast  10 mg Oral QHS  . sodium chloride flush  10-40 mL Intracatheter Q12H  . sodium chloride flush  3 mL Intravenous Q12H    Infusions: .  prismasol BGK 4/2.5 500 mL/hr at 12/08/20 0836  .  prismasol BGK 4/2.5 300 mL/hr at 12/08/20 0800  . sodium chloride    . sodium chloride    . amiodarone 60 mg/hr (12/08/20 0900)  . ferumoxytol    . heparin 1,350  Units/hr (12/08/20 0900)  . milrinone 0.125 mcg/kg/min (12/08/20 0900)  . norepinephrine (LEVOPHED) Adult infusion 1 mcg/min (12/08/20 0900)  . prismasol BGK 4/2.5 1,500 mL/hr at 12/08/20 0801  . sodium phosphate  Dextrose 5% IVPB 43 mL/hr at 12/08/20 0900    PRN Medications: sodium chloride, Place/Maintain arterial line **AND** sodium chloride, acetaminophen, heparin, ondansetron (ZOFRAN) IV, sodium chloride flush, sodium chloride flush    Assessment/Plan   1. Acute on chronic systolic CHF: Nonischemic cardiomyopathy.  Echo this admission with EF 25-30%, moderate LV dilation, mild LVH, moderate RVE with moderately decreased RV systolic function, severe biatrial enlargement, moderate-severe TR, IVC dilated. EF was low starting in 2016, cath at that time showed mild nonobstructive CAD.  Patient had runs of AF but not sustained when initial diagnosis was made.  Was readmitted in 2018 with persistent AF and CHF, echo with EF 20-25%.  Had DCCV back to NSR at that time.  EF up to 55-60% in 2019.  Lost to followup, now back with progressive dyspnea/volume overload and EF back down in setting of AF with RVR.  Difficult to identify initial etiology of CMP but cannot rule out tachy-mediated.  UDS negative for cocaine. 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  He does not feel AF and we are not sure how long he has been in it.  He is 30+ lbs up with marked volume overload and concern for low output HF (progressive rise in creatinine with attempts at diuresis).  Poor response to initiation of milrinone 0.25 then norepinephrine, creatinine continued to rise and CVVH started 5/12.  Remains massively volume overloaded. CVP 16. Continue CVVHD 150/hr - On low dose NE to keep MAP up. Will increase midodrine to 5 tid, continue milrinone 0.125 - Will need eventual RHC, consider repeat coronary angiography if creatinine improves significantly => HS-TnI slightly elevated with no trend so no ACS.   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   - Ultimately needs TEE-DCCV for conversion to NSR.  Current plan for Monday Monday. - Consider eventual cardiac MRI.  - Unna boots.  2. Atrial fibrillation: Admitted with afib/RVR, uncertain how long this has been present.  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 Needs conversion back to NSR. - Would like to see better diuresis before DCCV as he is markedly volume overloaded and less likely to hold NSR long-term. May be ready by tomorrow but still has at least 20 pounds to go.  - Continue amiodarone gtt 60 mg/hr.  - Continue on heparin gtt, eventually transition to Eliquis.  3. AKI: Uncertain baseline, creatinine 1.77 at arrival, creatinine up to 3.49 with poor diuresis and CVVH started with intractable volume overload. - Continue UF net  150 cc/hr today per Renal 4. Hematuria: Seen by urology => think old blood in urine, renal ultrasound ok.  Recommend continuing heparin gtt.  Hgb higher today, has had feraheme.  5. Constipation - sorbitol. Get to chair 6. Iron-deficieny anemia - will need feraheme  CRITICAL CARE Performed by: Arvilla Meres  Total critical care time: 35 minutes  Critical care time was exclusive of separately billable procedures and treating other patients.  Critical care was necessary to treat or prevent imminent or life-threatening deterioration.  Critical care was time spent personally by me on the following activities: development of treatment plan with patient and/or surrogate as well as nursing, discussions with consultants, evaluation of patient's response to treatment, examination of patient, obtaining history from patient or surrogate, ordering and performing treatments and interventions, ordering and review of laboratory studies, ordering and review of radiographic studies, pulse oximetry and re-evaluation of patient's condition.   Length of Stay: 6  Arvilla Meres, MD  12/08/2020, 9:28 AM  Advanced  Heart Failure Team Pager [phone] (M-F; 7a - 5p)  Please contact [facility] for night-coverage after hours (5p -7a ) and weekends on [email or web address]

## 2020-12-08 NOTE — Progress Notes (Signed)
ANTICOAGULATION CONSULT NOTE - Follow Up Consult  Pharmacy Consult for heparin Indication: atrial fibrillation  Labs: Recent Labs    12/05/20 1321 12/05/20 1851 12/06/20 0433 12/06/20 1642 12/07/20 0426 12/07/20 1506 12/08/20 0352  HGB  --   --  10.9*  --  10.4*  --   --   HCT  --   --  34.4*  --  32.2*  --   --   PLT  --   --  197  --  157  --   --   LABPROT 14.5  --   --   --   --   --   --   INR 1.1  --   --   --   --   --   --   HEPARINUNFRC  --   --  0.44  --  0.51  --  0.57  CREATININE  --    < > 2.88*   < > 1.86* 1.63* 1.56*   < > = values in this interval not displayed.    Assessment/Plan:  65yo male on heparin for Afib while holding home Eliquis. CBC stable, heparin level therapeutic. No overt bleeding noted. TEE-DCCV likely Monday.  Goal of Therapy:  Heparin level 0.3-0.7 units/ml Monitor platelets by anticoagulation protocol: Yes  Plan: Continue heparin at 1350 units/h Daily heparin level and CBC  Tama Headings, PharmD, BCPS PGY2 Cardiology Pharmacy Resident Phone: [phone] 12/08/2020  7:58 AM  Please check [email or web address] for unit-specific pharmacy phone numbers.

## 2020-12-08 NOTE — Progress Notes (Signed)
Subjective:  Continues on CRRT-   Neg 3600 last 24 hours, pressors being weaned. Still in rapid a fib-  280 of urine.  Cards wants to wait until more volume off before cardioversion but not likely to happen until tomorrow-- CVP improved    Objective Vital signs in last 24 hours: Vitals:   12/08/20 0630 12/08/20 0640 12/08/20 0650 12/08/20 0700  BP: 106/78   112/84  Pulse: 64 (!) 55 (!) 33 (!) 55  Resp: 12 19 14 20   Temp:      TempSrc:      SpO2: 94% (!) 83% 93% 92%  Weight:      Height:       Weight change:   Intake/Output Summary (Last 24 hours) at 12/08/2020 0716 Last data filed at 12/08/2020 0700 Gross per 24 hour  Intake 1394.39 ml  Output 4974 ml  Net -3579.61 ml    Assessment/ Plan: Pt is a 65 y.o. yo male -  crt 1.11 a year ago, who was admitted on 12/02/2020 with Afib and volume overload.   Initial crt 1.7 but increased in the setting of attempted diuresis and continued hemodyanamic instability  -  CRRT started on 5/12  1. Afib/cardiomyopathy/volume overload-  HF team involved-  Milrinone/ amio/ and norepi 2. Renal-  Last year crt 1.1-  Was 1.7 this hosp in the setting of above-  Increased with attempt at diuresis and hemodynamic instability.  CRRT started on 5/12 mostly for volume removal-  Have been able to achieve negative 150 per hour so far.  To continue.  But, his renal function should be salvageable so do not want to push too hard as to cause more severe ATN.  K OK, will give some phos   3. Anemia- reasonable-  Systemic heparin but no specific heparin with CRRT 4. HTN/volume- massive volume overload-  Trying to remove 150 per hour.  Multiple drips for cardiac purposes.    Still making a little urine-  CVP still high-  Cont 150 per hour for UF-  Trying to be careful with dropping BP too low and giving ATN  7/12    Labs: Basic Metabolic Panel: Recent Labs  Lab 12/07/20 0426 12/07/20 1506 12/08/20 0352  NA 135 134* 133*  K 4.6 4.6 4.5  CL 103 103  102  CO2 26 28 27   GLUCOSE 112* 115* 109*  BUN 31* 26* 21  CREATININE 1.86* 1.63* 1.56*  CALCIUM 8.7* 8.5* 8.7*  PHOS 3.3 2.9 2.7   Liver Function Tests: Recent Labs  Lab 12/02/20 0914 12/05/20 1851 12/07/20 0426 12/07/20 1506 12/08/20 0352  AST 19  --   --   --   --   ALT 17  --   --   --   --   ALKPHOS 104  --   --   --   --   BILITOT 2.4*  --   --   --   --   PROT 7.3  --   --   --   --   ALBUMIN 3.6   < > 2.8* 2.9* 2.9*   < > = values in this interval not displayed.   No results for input(s): LIPASE, AMYLASE in the last 168 hours. No results for input(s): AMMONIA in the last 168 hours. CBC: Recent Labs  Lab 12/02/20 0914 12/03/20 0415 12/04/20 0305 12/05/20 0426 12/06/20 0433 12/07/20 0426  WBC 9.3 9.4 9.9 8.8 9.8 7.3  NEUTROABS 5.9  --   --   --   --   --  HGB 13.1 12.4* 11.3* 9.8* 10.9* 10.4*  HCT 42.6 41.4 37.8* 32.6* 34.4* 32.2*  MCV 75.4* 76.7* 77.1* 76.7* 73.0* 72.7*  PLT 255 254 231 197 197 157   Cardiac Enzymes: No results for input(s): CKTOTAL, CKMB, CKMBINDEX, TROPONINI in the last 168 hours. CBG: Recent Labs  Lab 12/07/20 0634 12/07/20 1119 12/07/20 1538 12/07/20 2153 12/08/20 0601  GLUCAP 111* 111* 123* 110* 103*    Iron Studies:  Recent Labs    12/05/20 0930  IRON 37*  TIBC 412  FERRITIN 23*   Studies/Results: No results found. Medications: Infusions: .  prismasol BGK 4/2.5 500 mL/hr at 12/07/20 2156  .  prismasol BGK 4/2.5 300 mL/hr at 12/07/20 2203  . sodium chloride    . sodium chloride    . sodium chloride    . amiodarone 60 mg/hr (12/08/20 0700)  . heparin 1,350 Units/hr (12/08/20 0700)  . milrinone 0.125 mcg/kg/min (12/08/20 0700)  . norepinephrine (LEVOPHED) Adult infusion 1 mcg/min (12/08/20 0700)  . prismasol BGK 4/2.5 1,500 mL/hr at 12/08/20 0405    Scheduled Medications: . atorvastatin  10 mg Oral Daily  . Chlorhexidine Gluconate Cloth  6 each Topical Daily  . insulin aspart  0-5 Units Subcutaneous QHS  .  insulin aspart  0-9 Units Subcutaneous TID WC  . loratadine  10 mg Oral Daily  . midodrine  2.5 mg Oral TID WC  . montelukast  10 mg Oral QHS  . sodium chloride flush  10-40 mL Intracatheter Q12H  . sodium chloride flush  3 mL Intravenous Q12H    have reviewed scheduled and prn medications.  Physical Exam: General: tired but arousable Heart: tachy, irreg Lungs: dec BS at bases Abdomen: obese, non tender Extremities: pitting edema throughout Dialysis Access: left IJ cath placed 5/12 by CCM    12/08/2020,7:16 AM  LOS: 6 days

## 2020-12-08 NOTE — Plan of Care (Signed)

## 2020-12-09 ENCOUNTER — Inpatient Hospital Stay (HOSPITAL_COMMUNITY): Payer: Medicare Other | Admitting: Registered Nurse

## 2020-12-09 ENCOUNTER — Encounter (HOSPITAL_COMMUNITY)
Admission: EM | Disposition: A | Discharge status: Home / Self Care | Payer: Self-pay | Source: Home / Self Care | Attending: Cardiology

## 2020-12-09 ENCOUNTER — Inpatient Hospital Stay (HOSPITAL_COMMUNITY): Payer: Medicare Other

## 2020-12-09 ENCOUNTER — Encounter (HOSPITAL_COMMUNITY): Payer: Self-pay | Admitting: Internal Medicine

## 2020-12-09 DIAGNOSIS — I340 Nonrheumatic mitral (valve) insufficiency: Secondary | ICD-10-CM

## 2020-12-09 DIAGNOSIS — I4891 Unspecified atrial fibrillation: Secondary | ICD-10-CM

## 2020-12-09 DIAGNOSIS — I361 Nonrheumatic tricuspid (valve) insufficiency: Secondary | ICD-10-CM

## 2020-12-09 DIAGNOSIS — I5043 Acute on chronic combined systolic (congestive) and diastolic (congestive) heart failure: Secondary | ICD-10-CM | POA: Diagnosis not present

## 2020-12-09 HISTORY — PX: TEE WITHOUT CARDIOVERSION: SHX5443

## 2020-12-09 HISTORY — PX: CARDIOVERSION: SHX1299

## 2020-12-09 LAB — RENAL FUNCTION PANEL
Albumin: 2.8 g/dL — ABNORMAL LOW (ref 3.5–5.0)
Albumin: 2.8 g/dL — ABNORMAL LOW (ref 3.5–5.0)
Anion gap: 6 (ref 5–15)
Anion gap: 4 — ABNORMAL LOW (ref 5–15)
BUN: 17 mg/dL (ref 8–23)
BUN: 18 mg/dL (ref 8–23)
CO2: 26 mmol/L (ref 22–32)
CO2: 28 mmol/L (ref 22–32)
Calcium: 8.8 mg/dL — ABNORMAL LOW (ref 8.9–10.3)
Calcium: 8.9 mg/dL (ref 8.9–10.3)
Chloride: 100 mmol/L (ref 98–111)
Chloride: 103 mmol/L (ref 98–111)
Creatinine, Ser: 1.47 mg/dL — ABNORMAL HIGH (ref 0.61–1.24)
Creatinine, Ser: 1.69 mg/dL — ABNORMAL HIGH (ref 0.61–1.24)
GFR, Estimated: 52 mL/min — ABNORMAL LOW (ref >60)
GFR, Estimated: 44 mL/min — ABNORMAL LOW (ref >60)
Glucose, Bld: 193 mg/dL — ABNORMAL HIGH (ref 70–99)
Glucose, Bld: 86 mg/dL (ref 70–99)
Phosphorus: 3 mg/dL (ref 2.5–4.6)
Phosphorus: 3.2 mg/dL (ref 2.5–4.6)
Potassium: 4.3 mmol/L (ref 3.5–5.1)
Potassium: 4.3 mmol/L (ref 3.5–5.1)
Sodium: 132 mmol/L — ABNORMAL LOW (ref 135–145)
Sodium: 135 mmol/L (ref 135–145)

## 2020-12-09 LAB — COOXEMETRY PANEL
Carboxyhemoglobin: 1.1 % (ref 0.5–1.5)
Methemoglobin: 1.1 % (ref 0.0–1.5)
O2 Saturation: 59.1 %
Total hemoglobin: 11.4 g/dL — ABNORMAL LOW (ref 12.0–16.0)

## 2020-12-09 LAB — GLUCOSE, CAPILLARY
Glucose-Capillary: 105 mg/dL — ABNORMAL HIGH (ref 70–99)
Glucose-Capillary: 103 mg/dL — ABNORMAL HIGH (ref 70–99)
Glucose-Capillary: 97 mg/dL (ref 70–99)
Glucose-Capillary: 97 mg/dL (ref 70–99)

## 2020-12-09 LAB — HEPARIN LEVEL (UNFRACTIONATED): Heparin Unfractionated: 0.55 IU/mL (ref 0.30–0.70)

## 2020-12-09 LAB — MAGNESIUM: Magnesium: 2.3 mg/dL (ref 1.7–2.4)

## 2020-12-09 SURGERY — ECHOCARDIOGRAM, TRANSESOPHAGEAL
Anesthesia: General

## 2020-12-09 MED ORDER — SORBITOL 70 % SOLN
45.0000 mL | Freq: Once | Status: AC
  Administered 2020-12-09: 45 mL via ORAL
  Filled 2020-12-09: qty 60

## 2020-12-09 MED ORDER — MIDODRINE HCL 5 MG PO TABS
10.0000 mg | ORAL_TABLET | Freq: Three times a day (TID) | ORAL | Status: DC
  Administered 2020-12-09 (×3): 10 mg via ORAL
  Filled 2020-12-09 (×3): qty 2

## 2020-12-09 MED ORDER — PROPOFOL 10 MG/ML IV BOLUS
INTRAVENOUS | Status: DC | PRN
  Administered 2020-12-09 (×4): 10 mg via INTRAVENOUS

## 2020-12-09 MED ORDER — SODIUM CHLORIDE 0.9 % IV SOLN: INTRAVENOUS | Status: DC | PRN

## 2020-12-09 MED ORDER — SENNOSIDES-DOCUSATE SODIUM 8.6-50 MG PO TABS
1.0000 | ORAL_TABLET | Freq: Every day | ORAL | Status: DC
  Administered 2020-12-09: 1 via ORAL
  Filled 2020-12-09: qty 1

## 2020-12-09 MED ORDER — PROPOFOL 500 MG/50ML IV EMUL
INTRAVENOUS | Status: DC | PRN
  Administered 2020-12-09: 25 ug/kg/min via INTRAVENOUS

## 2020-12-09 MED ORDER — DEXMEDETOMIDINE (PRECEDEX) IN NS 20 MCG/5ML (4 MCG/ML) IV SYRINGE
PREFILLED_SYRINGE | INTRAVENOUS | Status: DC | PRN
  Administered 2020-12-09 (×2): 4 ug via INTRAVENOUS

## 2020-12-09 NOTE — Procedures (Signed)
I saw the patient on dialysis. I have reviewed the 24hr data and made appropriate changes. On norepi and milrinone. Remains overloaded. Tolerating UF.   Filed Weights   12/05/20 0408 12/08/20 0257 12/09/20 0500  Weight: 131.4 kg 127.4 kg 122.3 kg    Recent Labs  Lab 12/09/20 0341  NA 132*  K 4.3  CL 100  CO2 26  GLUCOSE 193*  BUN 17  CREATININE 1.47*  CALCIUM 8.8*  PHOS 3.0    Recent Labs  Lab 12/05/20 0426 12/06/20 0433 12/07/20 0426  WBC 8.8 9.8 7.3  HGB 9.8* 10.9* 10.4*  HCT 32.6* 34.4* 32.2*  MCV 76.7* 73.0* 72.7*  PLT 197 197 157    Scheduled Meds: . atorvastatin  10 mg Oral Daily  . Chlorhexidine Gluconate Cloth  6 each Topical Daily  . insulin aspart  0-5 Units Subcutaneous QHS  . insulin aspart  0-9 Units Subcutaneous TID WC  . loratadine  10 mg Oral Daily  . midodrine  10 mg Oral TID WC  . montelukast  10 mg Oral QHS  . senna-docusate  1 tablet Oral Daily  . sodium chloride flush  10-40 mL Intracatheter Q12H  . sodium chloride flush  3 mL Intravenous Q12H   Continuous Infusions: .  prismasol BGK 4/2.5 500 mL/hr at 12/09/20 0504  .  prismasol BGK 4/2.5 300 mL/hr at 12/09/20 0849  . sodium chloride    . sodium chloride    . amiodarone 60 mg/hr (12/09/20 0900)  . heparin 1,350 Units/hr (12/09/20 0900)  . milrinone 0.125 mcg/kg/min (12/09/20 0900)  . norepinephrine (LEVOPHED) Adult infusion Stopped (12/09/20 1017)  . prismasol BGK 4/2.5 1,500 mL/hr at 12/09/20 0749   PRN Meds:.sodium chloride, Place/Maintain arterial line **AND** sodium chloride, acetaminophen, heparin, ondansetron (ZOFRAN) IV, sodium chloride flush, sodium chloride flush   Louie Bun,  MD 12/09/2020, 10:10 AM

## 2020-12-09 NOTE — Progress Notes (Signed)
Subjective:  Steady on CRRT net negative 3.6L Continues on milrinone and norepi. Also on amio. UOP ~300ccs. CVP 17 and still over EDW. Cardioversion today. Patient states he is feeling okay today.   Objective Vital signs in last 24 hours: Vitals:   12/09/20 0600 12/09/20 0700 12/09/20 0800 12/09/20 0900  BP: 110/78  117/88   Pulse: (!) 47 68 (!) 113 (!) 122  Resp: 16 (!) 24 20 19   Temp:      TempSrc:      SpO2: 95% 95% (!) 88% 95%  Weight:      Height:       Weight change: -5.1 kg  Intake/Output Summary (Last 24 hours) at 12/09/2020 1002 Last data filed at 12/09/2020 0900 Gross per 24 hour  Intake 1564.35 ml  Output 5340 ml  Net -3775.65 ml    Assessment/ Plan: Pt is a 65 y.o. yo male -  crt 1.11 a year ago, who was admitted on 12/02/2020 with Afib and volume overload.   Initial crt 1.7 but increased in the setting of attempted diuresis and continued hemodyanamic instability  -  CRRT started on 5/12  1. Afib/cardiomyopathy/volume overload-  HF team involved-  Milrinone/ amio/ and norepi. Cardioversion 5/16 hopefully will improve hemodynamics to a certain degree. 2. AKI on CKD - baseline somewhere 1.1-1.7. Likely cardiorenal syndrome with ATN related to hemodynamic instability. CRRT started given volume overload. UOP present but oliguric. Continue CRRT for now given ongoing hemodynamic instability and re-evaluate ability to wean as able.   3. Anemia- reasonable-  Systemic heparin but no specific heparin with CRRT. CTM 4. HTN/volume- massive volume overload-  Achieving aggressive UF with good response. CTM 5. Hyponatremia: na 132 related to volume overload. CRRT and UF as above  6/16    Labs: Basic Metabolic Panel: Recent Labs  Lab 12/08/20 0352 12/08/20 1533 12/09/20 0341  NA 133* 135 132*  K 4.5 4.6 4.3  CL 102 102 100  CO2 27 27 26   GLUCOSE 109* 83 193*  BUN 21 18 17   CREATININE 1.56* 1.52* 1.47*  CALCIUM 8.7* 8.8* 8.8*  PHOS 2.7 3.7 3.0   Liver  Function Tests: Recent Labs  Lab 12/08/20 0352 12/08/20 1533 12/09/20 0341  ALBUMIN 2.9* 2.8* 2.8*   No results for input(s): LIPASE, AMYLASE in the last 168 hours. No results for input(s): AMMONIA in the last 168 hours. CBC: Recent Labs  Lab 12/03/20 0415 12/04/20 0305 12/05/20 0426 12/06/20 0433 12/07/20 0426  WBC 9.4 9.9 8.8 9.8 7.3  HGB 12.4* 11.3* 9.8* 10.9* 10.4*  HCT 41.4 37.8* 32.6* 34.4* 32.2*  MCV 76.7* 77.1* 76.7* 73.0* 72.7*  PLT 254 231 197 197 157   Cardiac Enzymes: No results for input(s): CKTOTAL, CKMB, CKMBINDEX, TROPONINI in the last 168 hours. CBG: Recent Labs  Lab 12/08/20 0721 12/08/20 1112 12/08/20 1601 12/08/20 2135 12/09/20 0626  GLUCAP 227* 105* 87 118* 105*    Iron Studies:  No results for input(s): IRON, TIBC, TRANSFERRIN, FERRITIN in the last 72 hours. Studies/Results: No results found. Medications: Infusions: .  prismasol BGK 4/2.5 500 mL/hr at 12/09/20 0504  .  prismasol BGK 4/2.5 300 mL/hr at 12/09/20 0849  . sodium chloride    . sodium chloride    . amiodarone 60 mg/hr (12/09/20 0900)  . heparin 1,350 Units/hr (12/09/20 0900)  . milrinone 0.125 mcg/kg/min (12/09/20 0900)  . norepinephrine (LEVOPHED) Adult infusion Stopped (12/09/20 12/11/20)  . prismasol BGK 4/2.5 1,500 mL/hr at 12/09/20 [phone]  Scheduled Medications: . atorvastatin  10 mg Oral Daily  . Chlorhexidine Gluconate Cloth  6 each Topical Daily  . insulin aspart  0-5 Units Subcutaneous QHS  . insulin aspart  0-9 Units Subcutaneous TID WC  . loratadine  10 mg Oral Daily  . midodrine  10 mg Oral TID WC  . montelukast  10 mg Oral QHS  . senna-docusate  1 tablet Oral Daily  . sodium chloride flush  10-40 mL Intracatheter Q12H  . sodium chloride flush  3 mL Intravenous Q12H    have reviewed scheduled and prn medications.  Physical Exam: General: lying in bed no destress Heart: tachy, irreg Lungs: dec BS at bases, tachypnea, no iwob Abdomen: obese, non  tender Extremities: pitting edema throughout Dialysis Access: left IJ cath placed 5/12 by CCM    12/09/2020,10:02 AM  LOS: 7 days

## 2020-12-09 NOTE — Anesthesia Procedure Notes (Signed)
Procedure Name: MAC Date/Time: 12/09/2020 3:58 PM Performed by: Dorthea Cove, CRNA Pre-anesthesia Checklist: Patient identified, Suction available, Emergency Drugs available, Patient being monitored and Timeout performed Patient Re-evaluated:Patient Re-evaluated prior to induction Oxygen Delivery Method: Simple face mask and Nasal cannula Preoxygenation: Pre-oxygenation with 100% oxygen Induction Type: IV induction Placement Confirmation: positive ETCO2 and CO2 detector Dental Injury: Teeth and Oropharynx as per pre-operative assessment

## 2020-12-09 NOTE — Interval H&P Note (Signed)
History and Physical Interval Note:  12/09/2020 4:04 PM  Kevin [name]  has presented today for surgery, with the diagnosis of AFIB.  The various methods of treatment have been discussed with the patient and family. After consideration of risks, benefits and other options for treatment, the patient has consented to  Procedure(s): TRANSESOPHAGEAL ECHOCARDIOGRAM (TEE) (N/A) CARDIOVERSION (N/A) as a surgical intervention.  The patient's history has been reviewed, patient examined, no change in status, stable for surgery.  I have reviewed the patient's chart and labs.  Questions were answered to the patient's satisfaction.     Kevin [name] [employer]

## 2020-12-09 NOTE — Anesthesia Postprocedure Evaluation (Signed)
Anesthesia Post Note  Patient: Dereck Agerton  Procedure(s) Performed: TRANSESOPHAGEAL ECHOCARDIOGRAM (TEE) (N/A ) CARDIOVERSION (N/A )     Patient location during evaluation: Endoscopy Anesthesia Type: General Level of consciousness: awake and alert Pain management: pain level controlled Vital Signs Assessment: post-procedure vital signs reviewed and stable Respiratory status: spontaneous breathing, nonlabored ventilation, respiratory function stable and patient connected to nasal cannula oxygen Cardiovascular status: blood pressure returned to baseline and stable Postop Assessment: no apparent nausea or vomiting Anesthetic complications: no   No complications documented.  Last Vitals:  Vitals:   12/09/20 1650 12/09/20 1657  BP: 111/63 127/70  Pulse: (!) 101 (!) 102  Resp: 19 20  Temp:    SpO2: 94% 97%    Last Pain:  Vitals:   12/09/20 1657  TempSrc:   PainSc: 0-No pain                 Earl Lites P Jahara Dail

## 2020-12-09 NOTE — TOC Initial Note (Signed)
Transition of Care (TOC) - Initial/Assessment Note  Heart Failure   Patient Details  Name: Kevin [name] MRN: [id number] Date of Birth: [birth year]  Transition of Care [facility]) CM/SW Contact:    Vidhi Delellis, LCSWA Phone Number: 12/09/2020, 2:45 PM  Clinical Narrative:         CSW spoke with patient at bedside and completed a very brief SDOH screening with the patient who reported having some needs like transportation. CSW obtained the patients signature for the rider waiver and release of liability to enroll patient in [employer]. Kevin [name] reported that he does get Food Stamps already but that he would benefit from food delivery as he doesn't always have transportation to get his groceries. Kevin [name] reported he could use transportation to get to his doctors appointments and [facility] and that he can get them most of the time.CSW provided the patient with the social workers name, number and position and if they identify other needs to please reach out so that CSW can provide support.     Barriers to Discharge: Continued Medical Work up   Patient Goals and CMS Choice        Expected Discharge Plan and Services   In-house Referral: Clinical Social Work                                            Prior Living Arrangements/Services     Patient language and need for interpreter reviewed:: Yes        Need for Family Participation in Patient Care: No (Comment) Care giver support system in place?: No (comment)   Criminal Activity/Legal Involvement Pertinent to Current Situation/Hospitalization: No - Comment as needed  Activities of Daily Living Home Assistive Devices/Equipment: None ADL Screening (condition at time of admission) Patient's cognitive ability adequate to safely complete daily activities?: Yes Is the patient deaf or have difficulty hearing?: No Does the patient have difficulty seeing, even when wearing  glasses/contacts?: No Does the patient have difficulty concentrating, remembering, or making decisions?: No Patient able to express need for assistance with ADLs?: Yes Does the patient have difficulty dressing or bathing?: No Independently performs ADLs?: Yes (appropriate for developmental age) Does the patient have difficulty walking or climbing stairs?: No Weakness of Legs: None Weakness of Arms/Hands: None  Permission Sought/Granted                  Emotional Assessment Appearance:: Appears stated age Attitude/Demeanor/Rapport: Engaged Affect (typically observed): Pleasant Orientation: : Oriented to Situation,Oriented to  Time,Oriented to Place,Oriented to Self   Psych Involvement: No (comment)  Admission diagnosis:  Atrial fibrillation with RVR (HCC) [I48.91] Acute on chronic combined systolic and diastolic CHF (congestive heart failure) (HCC) [I50.43] Systolic congestive heart failure, unspecified HF chronicity (HCC) [I50.20] Patient Active Problem List   Diagnosis Date Noted  . Acute on chronic combined systolic and diastolic CHF (congestive heart failure) (HCC) 12/02/2020  . Obese abdomen 06/08/2019  . Class 2 severe obesity due to excess calories with serious comorbidity and body mass index (BMI) of 36.0 to 36.9 in adult [facility]) 06/08/2019  . Muscle spasm 03/31/2019  . Sinus congestion 12/21/2018  . Tobacco use   . Non-obstructive CAD   . NICM (nonischemic cardiomyopathy) (HCC)   . Moderate mitral regurgitation   . Cocaine use   . Asthma   . Persistent  atrial fibrillation (HCC)   . DCM (dilated cardiomyopathy) (HCC)   . CHF (congestive heart failure) (HCC) 04/04/2017  . Current use of long term anticoagulation 02/24/2017  . Hypotension 02/24/2017  . CKD (chronic kidney disease), stage II 02/08/2017  . History of cocaine use 02/08/2017  . Former tobacco use 02/08/2017  . Atrial fibrillation with rapid ventricular response (HCC)   . Longstanding persistent atrial  fibrillation (HCC) 10/19/2016  . AKI (acute kidney injury) (HCC) 10/19/2016  . Mitral regurgitation 09/28/2016  . Essential hypertension 09/28/2016  . Acute on chronic systolic heart failure (HCC) 09/15/2016  . Non-ischemic cardiomyopathy (HCC)   . Coronary artery disease involving native coronary artery of native heart without angina pectoris   . Chronic systolic heart failure (HCC) 07/26/2015  . Paroxysmal atrial fibrillation (HCC) 07/23/2015  . COPD (chronic obstructive pulmonary disease) (HCC) 07/23/2015  . Elevated troponin 07/23/2015  . Elevated brain natriuretic peptide (BNP) level 07/23/2015   PCP:  Kallie Locks, FNP (Inactive) Pharmacy:   [phone] [address], [place] - [phone] AT [facility] [address] [place] [address] Phone: [phone] Fax: [phone]     Social Determinants of Health (SDOH) Interventions Food Insecurity Interventions: Other (Comment) (Patient reports having food stamps but it would be helpful if he could have groceries delivered as transportation is a concern.) Financial Strain Interventions: Other (Comment) (transportation is a concern and patient reported he cannot always afford public transportation) Housing Interventions: Intervention Not Indicated Transportation Interventions: Retail banker  Readmission Risk Interventions No flowsheet data found.  Hussien Greenblatt, MSW, LCSWA [phone] Heart Failure Social Worker

## 2020-12-09 NOTE — Progress Notes (Signed)
ANTICOAGULATION CONSULT NOTE - Follow Up Consult  Pharmacy Consult for heparin Indication: atrial fibrillation  Labs: Recent Labs    12/07/20 0426 12/07/20 1506 12/08/20 0352 12/08/20 1533 12/09/20 0341  HGB 10.4*  --   --   --   --   HCT 32.2*  --   --   --   --   PLT 157  --   --   --   --   HEPARINUNFRC 0.51  --  0.57  --  0.55  CREATININE 1.86*   < > 1.56* 1.52* 1.47*   < > = values in this interval not displayed.    Assessment/Plan:  65yo male on heparin for Afib while holding home Eliquis. Heparin level therapeutic this morning at 0.55. No overt bleeding noted. TEE-DCCV once excess fluid removed.  Goal of Therapy:  Heparin level 0.3-0.7 units/ml Monitor platelets by anticoagulation protocol: Yes  Plan: Continue heparin at 1350 units/h Daily heparin level and CBC  Fredonia Highland, PharmD, [place], [facility] Clinical Pharmacist [phone] Please check AMION for all [facility] Pharmacy numbers 12/09/2020  Please check [email or web address] for unit-specific pharmacy phone numbers.

## 2020-12-09 NOTE — H&P (View-Only) (Signed)
Patient ID: Kevin [name], male   DOB: 05/16/1956, 65 y.o.   MRN: [id number]     Advanced Heart Failure Rounding Note  PCP-Cardiologist: Chrystie Nose, MD   Subjective:    CVVH started 5/12 with intractable volume overload.    He remains on milrinone 0.125. On NE at 4. Co-ox marginal 59%. Currently pulling 200 cc/hr 5.3L pulled yesterday.  Only made about 300cc of urine. Wt down 11 lb since yesterday, still ~20 lb above normal wt. CVP 17.   Remains in Atrial fibrillation rate 100-115s on amiodarone gtt 60 mg/hr.   No complaints currently. Denies dyspnea. Lost his A-line overnight.    Objective:   Weight Range: 122.3 kg Body mass index is 35.57 kg/m.   Vital Signs:   Temp:  [97.3 F (36.3 C)-98.9 F (37.2 C)] 97.5 F (36.4 C) (05/16 0000) Pulse Rate:  [25-129] 47 (05/16 0600) Resp:  [11-26] 16 (05/16 0600) BP: (80-138)/(54-90) 110/78 (05/16 0600) SpO2:  [86 %-97 %] 95 % (05/16 0600) Arterial Line BP: (94-152)/(47-68) 131/60 (05/16 0600) Weight:  [122.3 kg] 122.3 kg (05/16 0500) Last BM Date: 12/02/20  Weight change: Filed Weights   12/05/20 0408 12/08/20 0257 12/09/20 0500  Weight: 131.4 kg 127.4 kg 122.3 kg    Intake/Output:   Intake/Output Summary (Last 24 hours) at 12/09/2020 0710 Last data filed at 12/09/2020 0700 Gross per 24 hour  Intake 1915.5 ml  Output 5574 ml  Net -3658.5 ml      Physical Exam    CVP 17  General:  Well appearing. No respiratory difficulty HEENT: normal Neck: supple. JVD elevated to jaw. + Lt IJ HD cath. Carotids 2+ bilat; no bruits. No lymphadenopathy or thyromegaly appreciated. Cor: PMI nondisplaced. Irregularly irregular rhythm and rate. No rubs, gallops or murmurs. Lungs: decreased BS at the bases  Abdomen: soft, nontender, nondistended. No hepatosplenomegaly. No bruits or masses. Good bowel sounds. Extremities: no cyanosis, clubbing, rash, 2+ bilateral LEE up to thighs, +unna boots Neuro: alert & oriented x 3, cranial nerves  grossly intact. moves all 4 extremities w/o difficulty. Affect pleasant.   Telemetry   Atrial fibrillation rate 100-115s Personally reviewed  Labs    CBC Recent Labs    12/07/20 0426  WBC 7.3  HGB 10.4*  HCT 32.2*  MCV 72.7*  PLT 157   Basic Metabolic Panel Recent Labs    42/35/36 0352 12/08/20 1533 12/09/20 0341  NA 133* 135 132*  K 4.5 4.6 4.3  CL 102 102 100  CO2 27 27 26   GLUCOSE 109* 83 193*  BUN 21 18 17   CREATININE 1.56* 1.52* 1.47*  CALCIUM 8.7* 8.8* 8.8*  MG 2.4  --  2.3  PHOS 2.7 3.7 3.0   Liver Function Tests Recent Labs    12/08/20 1533 12/09/20 0341  ALBUMIN 2.8* 2.8*   No results for input(s): LIPASE, AMYLASE in the last 72 hours. Cardiac Enzymes No results for input(s): CKTOTAL, CKMB, CKMBINDEX, TROPONINI in the last 72 hours.  BNP: BNP (last 3 results) Recent Labs    12/02/20 0915  BNP 1,158.5*    ProBNP (last 3 results) No results for input(s): PROBNP in the last 8760 hours.   D-Dimer No results for input(s): DDIMER in the last 72 hours. Hemoglobin A1C No results for input(s): HGBA1C in the last 72 hours. Fasting Lipid Panel No results for input(s): CHOL, HDL, LDLCALC, TRIG, CHOLHDL, LDLDIRECT in the last 72 hours. Thyroid Function Tests No results for input(s): TSH, T4TOTAL, T3FREE, THYROIDAB in the  last 72 hours.  Invalid input(s): FREET3  Other results:   Imaging    No results found.   Medications:     Scheduled Medications: . atorvastatin  10 mg Oral Daily  . Chlorhexidine Gluconate Cloth  6 each Topical Daily  . insulin aspart  0-5 Units Subcutaneous QHS  . insulin aspart  0-9 Units Subcutaneous TID WC  . loratadine  10 mg Oral Daily  . midodrine  5 mg Oral TID WC  . montelukast  10 mg Oral QHS  . sodium chloride flush  10-40 mL Intracatheter Q12H  . sodium chloride flush  3 mL Intravenous Q12H    Infusions: .  prismasol BGK 4/2.5 500 mL/hr at 12/09/20 0504  .  prismasol BGK 4/2.5 300 mL/hr at  12/09/20 0423  . sodium chloride    . sodium chloride    . amiodarone 60 mg/hr (12/09/20 0700)  . heparin 1,350 Units/hr (12/09/20 0700)  . milrinone 0.125 mcg/kg/min (12/09/20 0700)  . norepinephrine (LEVOPHED) Adult infusion 4 mcg/min (12/09/20 0700)  . prismasol BGK 4/2.5 1,500 mL/hr at 12/09/20 0422    PRN Medications: sodium chloride, Place/Maintain arterial line **AND** sodium chloride, acetaminophen, heparin, ondansetron (ZOFRAN) IV, sodium chloride flush, sodium chloride flush    Assessment/Plan   1. Acute on chronic systolic CHF: Nonischemic cardiomyopathy.  Echo this admission with EF 25-30%, moderate LV dilation, mild LVH, moderate RVE with moderately decreased RV systolic function, severe biatrial enlargement, moderate-severe TR, IVC dilated. EF was low starting in 2016, cath at that time showed mild nonobstructive CAD.  Patient had runs of AF but not sustained when initial diagnosis was made.  Was readmitted in 2018 with persistent AF and CHF, echo with EF 20-25%.  Had DCCV back to NSR at that time.  EF up to 55-60% in 2019.  Lost to followup, now back with progressive dyspnea/volume overload and EF back down in setting of AF with RVR.  Difficult to identify initial etiology of CMP but cannot rule out tachy-mediated.  UDS negative for cocaine. 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  He does not feel AF and we are not sure how long he has been in it.  He is 30+ lbs up with marked volume overload and concern for low output HF (progressive rise in creatinine with attempts at diuresis).  Poor response to initiation of milrinone 0.25 then norepinephrine, creatinine continued to rise and CVVH started 5/12.  Remains massively volume overloaded. CVP 17. Continue CVVHD 200/hr - On low dose NE to keep MAP up. Will increase midodrine to 10 tid, continue milrinone 0.125 - Will need eventual RHC, consider repeat coronary angiography if creatinine improves significantly  => HS-TnI slightly elevated with no trend so no ACS.  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   - Ultimately needs TEE-DCCV for conversion to NSR.  Scheduled today, but may need more fluid off first  - Consider eventual cardiac MRI.  - Unna boots.  2. Atrial fibrillation: Admitted with afib/RVR, uncertain how long this has been present.  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 Needs conversion back to NSR. - Would like to see better diuresis before DCCV as he is markedly volume overloaded and less likely to hold NSR long-term.  - Continue amiodarone gtt 60 mg/hr.  - Continue on heparin gtt, eventually transition to Eliquis.  3. AKI: Uncertain baseline, creatinine 1.77 at arrival, creatinine up to 3.49 with poor diuresis and CVVH started with intractable volume overload. - Continue UF net 200  cc/hr today per Renal 4. Hematuria: Seen by urology => think old blood in urine, renal ultrasound ok.  Recommend continuing heparin gtt.  Has foley. No further gross hematuria  5. Constipation - repeat sorbitol and add senocot. Get to chair 6. Iron-deficieny anemia - received feraheme 5/15  Length of Stay: [address], PA-C  12/09/2020, 7:10 AM  Advanced Heart Failure Team Pager [phone] (M-F; 7a - 5p)  Please contact [facility] for night-coverage after hours (5p -7a ) and weekends on [email or web address]  Patient seen with NP, agree with the above note.   CVVH ongoing, pulling 150-200 cc/hr net UF.  Weight down 11 lbs, CVP down to 17.  HR 110s-120s atrial fibrillation.  He remains on NE 4, milrinone 0.125, amiodarone 60 mg/hr, heparin gtt.  He made about 290 cc urine yesterday.   General: NAD Neck: JVP 16 cm, no thyromegaly or thyroid nodule.  Lungs: Clear to auscultation bilaterally with normal respiratory effort. CV: Nonpalpable PMI.  Heart tachy, irregular S1/S2, no S3/S4, no murmur.  2+ edema to knees. Abdomen: Soft, nontender, no hepatosplenomegaly, no distention.  Skin:  Intact without lesions or rashes.  Neurologic: Alert and oriented x 3.  Psych: Normal affect. Extremities: No clubbing or cyanosis.  HEENT: Normal.   Still volume overloaded but improving with CVVH.  Weight down.  Would aim to pull no more than 150 cc/hr net UF to avoid worsening ATN.   Continue milrinone 0.125 for now, wean NE down as MAP tolerates (on 4 currently, should be able to decrease).   Continue amiodarone gtt and heparin gtt, plan for TEE-guided DCCV later today.   CRITICAL CARE Performed by: Marca Ancona  Total critical care time: 35 minutes  Critical care time was exclusive of separately billable procedures and treating other patients.  Critical care was necessary to treat or prevent imminent or life-threatening deterioration.  Critical care was time spent personally by me on the following activities: development of treatment plan with patient and/or surrogate as well as nursing, discussions with consultants, evaluation of patient's response to treatment, examination of patient, obtaining history from patient or surrogate, ordering and performing treatments and interventions, ordering and review of laboratory studies, ordering and review of radiographic studies, pulse oximetry and re-evaluation of patient's condition.  Marca Ancona 12/09/2020 7:36 AM

## 2020-12-09 NOTE — CV Procedure (Signed)
Procedure: TEE  Indication: Atrial fibrillation  Sedation: Per anesthesiology  Findings: Please see echo section for full report.  Moderately dilated left ventricle with EF 25%, diffuse hypokinesis.  Normal wall thickness.  Normal RV size with moderately decreased systolic function.  Mild right atrial enlargement.  Moderate left atrial enlargement with no LA appendage thrombus.  No PFO/ASD by color doppler.  There was moderate-severe tricuspid regurgitation.  Peak RV-RA gradient 36 mmHg.  There was moderate central mitral regurgitation, appeared functional.  Trileaflet aortic valve with no AS, trivial aortic insufficiency.  Normal caliber thoracic aorta with minimal plaque.    Impression: May proceed to DCCV.  Kevin [name] 12/09/2020 4:22 PM

## 2020-12-09 NOTE — Procedures (Signed)
Electrical Cardioversion Procedure Note Kevin [name] [id number] 1956/04/29  Procedure: Electrical Cardioversion Indications:  Atrial Fibrillation  Procedure Details Consent: Risks of procedure as well as the alternatives and risks of each were explained to the (patient/caregiver).  Consent for procedure obtained. Time Out: Verified patient identification, verified procedure, site/side was marked, verified correct patient position, special equipment/implants available, medications/allergies/relevent history reviewed, required imaging and test results available.  Performed  Patient placed on cardiac monitor, pulse oximetry, supplemental oxygen as necessary.  Sedation given: Propofol per anesthesiology Pacer pads placed anterior and posterior chest.  Cardioverted 2 time(s).  Cardioverted at 200J.  Evaluation Findings: Post procedure EKG shows: NSR Complications: None Patient did tolerate procedure well.   Kevin [name] 12/09/2020, 4:23 PM

## 2020-12-09 NOTE — Progress Notes (Signed)
RN walked into patients room to Arterial Line alarm. Pt was trying to oyull himself up in the bed and pulled out his Aline by accident.

## 2020-12-09 NOTE — Progress Notes (Signed)
Orthopedic Tech Progress Note Patient Details:  Kevin [name] [birth year] [id number]  Ortho Devices Type of Ortho Device: Roland Rack boot Ortho Device/Splint Location: BLE Ortho Device/Splint Interventions: Ordered,Application   Post Interventions Patient Tolerated: Well Instructions Provided: Care of device   Donald Pore 12/09/2020, 9:44 AM

## 2020-12-09 NOTE — Progress Notes (Signed)
  Echocardiogram Echocardiogram Transesophageal has been performed.  Delcie Roch 12/09/2020, 4:33 PM

## 2020-12-09 NOTE — Transfer of Care (Signed)
Immediate Anesthesia Transfer of Care Note  Patient: Kevin [name]  Procedure(s) Performed: TRANSESOPHAGEAL ECHOCARDIOGRAM (TEE) (N/A ) CARDIOVERSION (N/A )   Patient Location: Endoscopy Unit  Anesthesia Type:General  Level of Consciousness: awake, alert  and oriented  Airway & Oxygen Therapy: Patient Spontanous Breathing and Patient connected to face mask oxygen  Post-op Assessment: Report given to RN and Post -op Vital signs reviewed and stable  Post vital signs: Reviewed and stable  Last Vitals:  Vitals Value Taken Time  BP    Temp    Pulse    Resp    SpO2 99     Last Pain:  Vitals:   12/09/20 1448  TempSrc: Oral  PainSc: 0-No pain         Complications: No complications documented.

## 2020-12-09 NOTE — Anesthesia Preprocedure Evaluation (Signed)
Anesthesia Evaluation  Patient identified by MRN, date of birth, ID band Patient awake    Reviewed: Allergy & Precautions, NPO status , Patient's Chart, lab work & pertinent test results  Airway Mallampati: II  TM Distance: >3 FB Neck ROM: Full    Dental  (+) Teeth Intact   Pulmonary asthma , COPD, former smoker,    Pulmonary exam normal        Cardiovascular hypertension, Pt. on medications + CAD and +CHF  + dysrhythmias  Rhythm:Irregular Rate:Normal     Neuro/Psych negative neurological ROS  negative psych ROS   GI/Hepatic negative GI ROS, Neg liver ROS,   Endo/Other  negative endocrine ROS  Renal/GU   negative genitourinary   Musculoskeletal negative musculoskeletal ROS (+)   Abdominal (+)  Abdomen: soft. Bowel sounds: normal.  Peds  Hematology negative hematology ROS (+)   Anesthesia Other Findings   Reproductive/Obstetrics                             Anesthesia Physical Anesthesia Plan  ASA: III  Anesthesia Plan: General   Post-op Pain Management:    Induction: Intravenous  PONV Risk Score and Plan: 2 and Propofol infusion and Treatment may vary due to age or medical condition  Airway Management Planned: Mask, Simple Face Mask, Nasal Cannula and Natural Airway  Additional Equipment: None  Intra-op Plan:   Post-operative Plan:   Informed Consent: I have reviewed the patients History and Physical, chart, labs and discussed the procedure including the risks, benefits and alternatives for the proposed anesthesia with the patient or authorized representative who has indicated his/her understanding and acceptance.     Dental advisory given  Plan Discussed with: CRNA  Anesthesia Plan Comments: (ECHO 12/03/20: 1. Left ventricular ejection fraction, by estimation, is 25 to 30%. The  left ventricle has severely decreased function. The left ventricle  demonstrates global  hypokinesis. The left ventricular internal cavity size  was moderately dilated. There is mild  eccentric left ventricular hypertrophy. Left ventricular diastolic  function could not be evaluated. There is the interventricular septum is  flattened in diastole ('D' shaped left ventricle), consistent with right  ventricular volume overload.  2. Right ventricular systolic function is moderately reduced. The right  ventricular size is moderately enlarged. There is mildly elevated  pulmonary artery systolic pressure.  3. Left atrial size was severely dilated.  4. Right atrial size was severely dilated.  5. Mitral insufficiency appears to be at least mild-to-moderate, probably  worse, in the parasternal long axis view. Very little mitral insufficiency  is seen on the apical views. The mitral valve is normal in structure,  albeit with annular dilation and  some degree of tenting due to LV remodeling.  6. Tricuspid valve regurgitation is moderate to severe.  7. The aortic valve is tricuspid. Aortic valve regurgitation is not  visualized. No aortic stenosis is present.  8. There is borderline dilatation of the ascending aorta, measuring 38  mm.  9. The inferior vena cava is dilated in size with <50% respiratory  variability, suggesting right atrial pressure of 15 mmHg. )        Anesthesia Quick Evaluation

## 2020-12-09 NOTE — Progress Notes (Addendum)
Patient ID: Kevin [name], male   DOB: 05/16/1956, 65 y.o.   MRN: [id number]     Advanced Heart Failure Rounding Note  PCP-Cardiologist: Chrystie Nose, MD   Subjective:    CVVH started 5/12 with intractable volume overload.    He remains on milrinone 0.125. On NE at 4. Co-ox marginal 59%. Currently pulling 200 cc/hr 5.3L pulled yesterday.  Only made about 300cc of urine. Wt down 11 lb since yesterday, still ~20 lb above normal wt. CVP 17.   Remains in Atrial fibrillation rate 100-115s on amiodarone gtt 60 mg/hr.   No complaints currently. Denies dyspnea. Lost his A-line overnight.    Objective:   Weight Range: 122.3 kg Body mass index is 35.57 kg/m.   Vital Signs:   Temp:  [97.3 F (36.3 C)-98.9 F (37.2 C)] 97.5 F (36.4 C) (05/16 0000) Pulse Rate:  [25-129] 47 (05/16 0600) Resp:  [11-26] 16 (05/16 0600) BP: (80-138)/(54-90) 110/78 (05/16 0600) SpO2:  [86 %-97 %] 95 % (05/16 0600) Arterial Line BP: (94-152)/(47-68) 131/60 (05/16 0600) Weight:  [122.3 kg] 122.3 kg (05/16 0500) Last BM Date: 12/02/20  Weight change: Filed Weights   12/05/20 0408 12/08/20 0257 12/09/20 0500  Weight: 131.4 kg 127.4 kg 122.3 kg    Intake/Output:   Intake/Output Summary (Last 24 hours) at 12/09/2020 0710 Last data filed at 12/09/2020 0700 Gross per 24 hour  Intake 1915.5 ml  Output 5574 ml  Net -3658.5 ml      Physical Exam    CVP 17  General:  Well appearing. No respiratory difficulty HEENT: normal Neck: supple. JVD elevated to jaw. + Lt IJ HD cath. Carotids 2+ bilat; no bruits. No lymphadenopathy or thyromegaly appreciated. Cor: PMI nondisplaced. Irregularly irregular rhythm and rate. No rubs, gallops or murmurs. Lungs: decreased BS at the bases  Abdomen: soft, nontender, nondistended. No hepatosplenomegaly. No bruits or masses. Good bowel sounds. Extremities: no cyanosis, clubbing, rash, 2+ bilateral LEE up to thighs, +unna boots Neuro: alert & oriented x 3, cranial nerves  grossly intact. moves all 4 extremities w/o difficulty. Affect pleasant.   Telemetry   Atrial fibrillation rate 100-115s Personally reviewed  Labs    CBC Recent Labs    12/07/20 0426  WBC 7.3  HGB 10.4*  HCT 32.2*  MCV 72.7*  PLT 157   Basic Metabolic Panel Recent Labs    42/35/36 0352 12/08/20 1533 12/09/20 0341  NA 133* 135 132*  K 4.5 4.6 4.3  CL 102 102 100  CO2 27 27 26   GLUCOSE 109* 83 193*  BUN 21 18 17   CREATININE 1.56* 1.52* 1.47*  CALCIUM 8.7* 8.8* 8.8*  MG 2.4  --  2.3  PHOS 2.7 3.7 3.0   Liver Function Tests Recent Labs    12/08/20 1533 12/09/20 0341  ALBUMIN 2.8* 2.8*   No results for input(s): LIPASE, AMYLASE in the last 72 hours. Cardiac Enzymes No results for input(s): CKTOTAL, CKMB, CKMBINDEX, TROPONINI in the last 72 hours.  BNP: BNP (last 3 results) Recent Labs    12/02/20 0915  BNP 1,158.5*    ProBNP (last 3 results) No results for input(s): PROBNP in the last 8760 hours.   D-Dimer No results for input(s): DDIMER in the last 72 hours. Hemoglobin A1C No results for input(s): HGBA1C in the last 72 hours. Fasting Lipid Panel No results for input(s): CHOL, HDL, LDLCALC, TRIG, CHOLHDL, LDLDIRECT in the last 72 hours. Thyroid Function Tests No results for input(s): TSH, T4TOTAL, T3FREE, THYROIDAB in the  last 72 hours.  Invalid input(s): FREET3  Other results:   Imaging    No results found.   Medications:     Scheduled Medications: . atorvastatin  10 mg Oral Daily  . Chlorhexidine Gluconate Cloth  6 each Topical Daily  . insulin aspart  0-5 Units Subcutaneous QHS  . insulin aspart  0-9 Units Subcutaneous TID WC  . loratadine  10 mg Oral Daily  . midodrine  5 mg Oral TID WC  . montelukast  10 mg Oral QHS  . sodium chloride flush  10-40 mL Intracatheter Q12H  . sodium chloride flush  3 mL Intravenous Q12H    Infusions: .  prismasol BGK 4/2.5 500 mL/hr at 12/09/20 0504  .  prismasol BGK 4/2.5 300 mL/hr at  12/09/20 0423  . sodium chloride    . sodium chloride    . amiodarone 60 mg/hr (12/09/20 0700)  . heparin 1,350 Units/hr (12/09/20 0700)  . milrinone 0.125 mcg/kg/min (12/09/20 0700)  . norepinephrine (LEVOPHED) Adult infusion 4 mcg/min (12/09/20 0700)  . prismasol BGK 4/2.5 1,500 mL/hr at 12/09/20 0422    PRN Medications: sodium chloride, Place/Maintain arterial line **AND** sodium chloride, acetaminophen, heparin, ondansetron (ZOFRAN) IV, sodium chloride flush, sodium chloride flush    Assessment/Plan   1. Acute on chronic systolic CHF: Nonischemic cardiomyopathy.  Echo this admission with EF 25-30%, moderate LV dilation, mild LVH, moderate RVE with moderately decreased RV systolic function, severe biatrial enlargement, moderate-severe TR, IVC dilated. EF was low starting in 2016, cath at that time showed mild nonobstructive CAD.  Patient had runs of AF but not sustained when initial diagnosis was made.  Was readmitted in 2018 with persistent AF and CHF, echo with EF 20-25%.  Had DCCV back to NSR at that time.  EF up to 55-60% in 2019.  Lost to followup, now back with progressive dyspnea/volume overload and EF back down in setting of AF with RVR.  Difficult to identify initial etiology of CMP but cannot rule out tachy-mediated.  UDS negative for cocaine. 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  He does not feel AF and we are not sure how long he has been in it.  He is 30+ lbs up with marked volume overload and concern for low output HF (progressive rise in creatinine with attempts at diuresis).  Poor response to initiation of milrinone 0.25 then norepinephrine, creatinine continued to rise and CVVH started 5/12.  Remains massively volume overloaded. CVP 17. Continue CVVHD 200/hr - On low dose NE to keep MAP up. Will increase midodrine to 10 tid, continue milrinone 0.125 - Will need eventual RHC, consider repeat coronary angiography if creatinine improves significantly  => HS-TnI slightly elevated with no trend so no ACS.  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   - Ultimately needs TEE-DCCV for conversion to NSR.  Scheduled today, but may need more fluid off first  - Consider eventual cardiac MRI.  - Unna boots.  2. Atrial fibrillation: Admitted with afib/RVR, uncertain how long this has been present.  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 Needs conversion back to NSR. - Would like to see better diuresis before DCCV as he is markedly volume overloaded and less likely to hold NSR long-term.  - Continue amiodarone gtt 60 mg/hr.  - Continue on heparin gtt, eventually transition to Eliquis.  3. AKI: Uncertain baseline, creatinine 1.77 at arrival, creatinine up to 3.49 with poor diuresis and CVVH started with intractable volume overload. - Continue UF net 200  cc/hr today per Renal 4. Hematuria: Seen by urology => think old blood in urine, renal ultrasound ok.  Recommend continuing heparin gtt.  Has foley. No further gross hematuria  5. Constipation - repeat sorbitol and add senocot. Get to chair 6. Iron-deficieny anemia - received feraheme 5/15  Length of Stay: [address], PA-C  12/09/2020, 7:10 AM  Advanced Heart Failure Team Pager [phone] (M-F; 7a - 5p)  Please contact [facility] for night-coverage after hours (5p -7a ) and weekends on [email or web address]  Patient seen with NP, agree with the above note.   CVVH ongoing, pulling 150-200 cc/hr net UF.  Weight down 11 lbs, CVP down to 17.  HR 110s-120s atrial fibrillation.  He remains on NE 4, milrinone 0.125, amiodarone 60 mg/hr, heparin gtt.  He made about 290 cc urine yesterday.   General: NAD Neck: JVP 16 cm, no thyromegaly or thyroid nodule.  Lungs: Clear to auscultation bilaterally with normal respiratory effort. CV: Nonpalpable PMI.  Heart tachy, irregular S1/S2, no S3/S4, no murmur.  2+ edema to knees. Abdomen: Soft, nontender, no hepatosplenomegaly, no distention.  Skin:  Intact without lesions or rashes.  Neurologic: Alert and oriented x 3.  Psych: Normal affect. Extremities: No clubbing or cyanosis.  HEENT: Normal.   Still volume overloaded but improving with CVVH.  Weight down.  Would aim to pull no more than 150 cc/hr net UF to avoid worsening ATN.   Continue milrinone 0.125 for now, wean NE down as MAP tolerates (on 4 currently, should be able to decrease).   Continue amiodarone gtt and heparin gtt, plan for TEE-guided DCCV later today.   CRITICAL CARE Performed by: Marca Ancona  Total critical care time: 35 minutes  Critical care time was exclusive of separately billable procedures and treating other patients.  Critical care was necessary to treat or prevent imminent or life-threatening deterioration.  Critical care was time spent personally by me on the following activities: development of treatment plan with patient and/or surrogate as well as nursing, discussions with consultants, evaluation of patient's response to treatment, examination of patient, obtaining history from patient or surrogate, ordering and performing treatments and interventions, ordering and review of laboratory studies, ordering and review of radiographic studies, pulse oximetry and re-evaluation of patient's condition.  Marca Ancona 12/09/2020 7:36 AM

## 2020-12-10 DIAGNOSIS — I5043 Acute on chronic combined systolic (congestive) and diastolic (congestive) heart failure: Secondary | ICD-10-CM | POA: Diagnosis not present

## 2020-12-10 LAB — CBC
HCT: 33.6 % — ABNORMAL LOW (ref 39.0–52.0)
Hemoglobin: 10.7 g/dL — ABNORMAL LOW (ref 13.0–17.0)
MCH: 23.4 pg — ABNORMAL LOW (ref 26.0–34.0)
MCHC: 31.8 g/dL (ref 30.0–36.0)
MCV: 73.5 fL — ABNORMAL LOW (ref 80.0–100.0)
Platelets: 157 10*3/uL (ref 150–400)
RBC: 4.57 MIL/uL (ref 4.22–5.81)
RDW: 18 % — ABNORMAL HIGH (ref 11.5–15.5)
WBC: 11.6 10*3/uL — ABNORMAL HIGH (ref 4.0–10.5)
nRBC: 0 % (ref 0.0–0.2)

## 2020-12-10 LAB — COOXEMETRY PANEL
Carboxyhemoglobin: 1 % (ref 0.5–1.5)
Methemoglobin: 1.2 % (ref 0.0–1.5)
O2 Saturation: 81.6 %
Total hemoglobin: 11 g/dL — ABNORMAL LOW (ref 12.0–16.0)

## 2020-12-10 LAB — RENAL FUNCTION PANEL
Albumin: 2.8 g/dL — ABNORMAL LOW (ref 3.5–5.0)
Albumin: 2.9 g/dL — ABNORMAL LOW (ref 3.5–5.0)
Anion gap: 5 (ref 5–15)
Anion gap: 6 (ref 5–15)
BUN: 17 mg/dL (ref 8–23)
BUN: 19 mg/dL (ref 8–23)
CO2: 28 mmol/L (ref 22–32)
CO2: 26 mmol/L (ref 22–32)
Calcium: 8.7 mg/dL — ABNORMAL LOW (ref 8.9–10.3)
Calcium: 9.1 mg/dL (ref 8.9–10.3)
Chloride: 102 mmol/L (ref 98–111)
Chloride: 102 mmol/L (ref 98–111)
Creatinine, Ser: 1.73 mg/dL — ABNORMAL HIGH (ref 0.61–1.24)
Creatinine, Ser: 1.69 mg/dL — ABNORMAL HIGH (ref 0.61–1.24)
GFR, Estimated: 43 mL/min — ABNORMAL LOW (ref >60)
GFR, Estimated: 44 mL/min — ABNORMAL LOW (ref >60)
Glucose, Bld: 99 mg/dL (ref 70–99)
Glucose, Bld: 112 mg/dL — ABNORMAL HIGH (ref 70–99)
Phosphorus: 2.9 mg/dL (ref 2.5–4.6)
Phosphorus: 2.7 mg/dL (ref 2.5–4.6)
Potassium: 4.5 mmol/L (ref 3.5–5.1)
Potassium: 4.5 mmol/L (ref 3.5–5.1)
Sodium: 135 mmol/L (ref 135–145)
Sodium: 134 mmol/L — ABNORMAL LOW (ref 135–145)

## 2020-12-10 LAB — HEPARIN LEVEL (UNFRACTIONATED): Heparin Unfractionated: 0.45 IU/mL (ref 0.30–0.70)

## 2020-12-10 LAB — MAGNESIUM: Magnesium: 2.4 mg/dL (ref 1.7–2.4)

## 2020-12-10 LAB — GLUCOSE, CAPILLARY
Glucose-Capillary: 105 mg/dL — ABNORMAL HIGH (ref 70–99)
Glucose-Capillary: 86 mg/dL (ref 70–99)
Glucose-Capillary: 91 mg/dL (ref 70–99)
Glucose-Capillary: 126 mg/dL — ABNORMAL HIGH (ref 70–99)

## 2020-12-10 MED ORDER — POLYETHYLENE GLYCOL 3350 17 G PO PACK: 17.0000 g | PACK | Freq: Every day | ORAL | Status: DC

## 2020-12-10 MED ORDER — PROSOURCE PLUS PO LIQD
30.0000 mL | Freq: Two times a day (BID) | ORAL | Status: DC
  Administered 2020-12-10 – 2020-12-21 (×17): 30 mL via ORAL
  Filled 2020-12-10 (×17): qty 30

## 2020-12-10 MED ORDER — MIDODRINE HCL 5 MG PO TABS
12.5000 mg | ORAL_TABLET | Freq: Three times a day (TID) | ORAL | Status: DC
  Administered 2020-12-10 – 2020-12-12 (×7): 12.5 mg via ORAL
  Filled 2020-12-10 (×7): qty 3

## 2020-12-10 MED ORDER — ENSURE ENLIVE PO LIQD
237.0000 mL | Freq: Two times a day (BID) | ORAL | Status: DC
  Administered 2020-12-10 – 2020-12-11 (×2): 237 mL via ORAL

## 2020-12-10 MED ORDER — PANTOPRAZOLE SODIUM 40 MG PO TBEC
40.0000 mg | DELAYED_RELEASE_TABLET | Freq: Every day | ORAL | Status: DC
  Administered 2020-12-10 – 2020-12-21 (×12): 40 mg via ORAL
  Filled 2020-12-10 (×12): qty 1

## 2020-12-10 MED ORDER — B COMPLEX-C PO TABS
1.0000 | ORAL_TABLET | Freq: Every day | ORAL | Status: DC
  Administered 2020-12-10 – 2020-12-21 (×12): 1 via ORAL
  Filled 2020-12-10 (×12): qty 1

## 2020-12-10 MED ORDER — SENNOSIDES-DOCUSATE SODIUM 8.6-50 MG PO TABS
1.0000 | ORAL_TABLET | Freq: Two times a day (BID) | ORAL | Status: DC
  Administered 2020-12-10 – 2020-12-20 (×15): 1 via ORAL
  Filled 2020-12-10 (×20): qty 1

## 2020-12-10 MED ORDER — GLYCERIN (LAXATIVE) 2 G RE SUPP
1.0000 | RECTAL | Status: DC
  Filled 2020-12-10: qty 1

## 2020-12-10 NOTE — Progress Notes (Addendum)
Patient ID: Kevin [name], male   DOB: 1956/01/04, 65 y.o.   MRN: [id number]     Advanced Heart Failure Rounding Note  PCP-Cardiologist: Chrystie Nose, MD   Subjective:    CVVH started 5/12 with intractable volume overload.   TEE/DCCV>>NSR 5/16  He remains in NSR after cardioversion yesterday.   3.8L pulled w/ CVVH yesterday. Oliguric w/ only 61 cc in UOP charted. SCr climbing, 1.47>>1.69>>1.73. CVP 11. CVVH pulling 150/hr  On Milrinone 0.125. Co-ox 82%. NE added back overnight for hypotension. Currently on 2 mcg. SBPs low 100s.   Had BM yesterday. Feels ok. No current complaints.   TEE: Moderate LV dilation with EF 25%, moderately decreased RV function, moderate functional MR, mod-severe TR.    Objective:   Weight Range: 122.3 kg Body mass index is 35.57 kg/m.   Vital Signs:   Temp:  [98 F (36.7 C)-99.1 F (37.3 C)] 99.1 F (37.3 C) (05/17 0400) Pulse Rate:  [33-122] 97 (05/17 0700) Resp:  [11-23] 11 (05/17 0700) BP: (87-138)/(49-94) 109/76 (05/17 0700) SpO2:  [88 %-99 %] 94 % (05/17 0700) Last BM Date: 12/02/20  Weight change: Filed Weights   12/05/20 0408 12/08/20 0257 12/09/20 0500  Weight: 131.4 kg 127.4 kg 122.3 kg    Intake/Output:   Intake/Output Summary (Last 24 hours) at 12/10/2020 0707 Last data filed at 12/10/2020 0700 Gross per 24 hour  Intake 1160.11 ml  Output 3887 ml  Net -2726.89 ml      Physical Exam    PHYSICAL EXAM: CVP 11 General:  Well appearing. No respiratory difficulty HEENT: normal Neck: supple. no JVD elevated to ear. Carotids 2+ bilat; no bruits. No lymphadenopathy or thyromegaly appreciated. Cor: PMI nondisplaced. Regular rate & rhythm. No rubs, gallops or murmurs. Lungs: clear Abdomen: soft, nontender, nondistended. No hepatosplenomegaly. No bruits or masses. Good bowel sounds. Extremities: no cyanosis, clubbing, rash, 2+ LE edema up to thighs  Neuro: alert & oriented x 3, cranial nerves grossly intact. moves all 4  extremities w/o difficulty. Affect pleasant.    Telemetry   NSR 90s w/ occasional PACs overnight Personally reviewed  Labs    CBC Recent Labs    12/10/20 0317  WBC 11.6*  HGB 10.7*  HCT 33.6*  MCV 73.5*  PLT 157   Basic Metabolic Panel Recent Labs    63/01/60 0341 12/09/20 1835 12/10/20 0317  NA 132* 135 135  K 4.3 4.3 4.5  CL 100 103 102  CO2 26 28 28   GLUCOSE 193* 86 99  BUN 17 18 17   CREATININE 1.47* 1.69* 1.73*  CALCIUM 8.8* 8.9 8.7*  MG 2.3  --  2.4  PHOS 3.0 3.2 2.9   Liver Function Tests Recent Labs    12/09/20 1835 12/10/20 0317  ALBUMIN 2.8* 2.8*   No results for input(s): LIPASE, AMYLASE in the last 72 hours. Cardiac Enzymes No results for input(s): CKTOTAL, CKMB, CKMBINDEX, TROPONINI in the last 72 hours.  BNP: BNP (last 3 results) Recent Labs    12/02/20 0915  BNP 1,158.5*    ProBNP (last 3 results) No results for input(s): PROBNP in the last 8760 hours.   D-Dimer No results for input(s): DDIMER in the last 72 hours. Hemoglobin A1C No results for input(s): HGBA1C in the last 72 hours. Fasting Lipid Panel No results for input(s): CHOL, HDL, LDLCALC, TRIG, CHOLHDL, LDLDIRECT in the last 72 hours. Thyroid Function Tests No results for input(s): TSH, T4TOTAL, T3FREE, THYROIDAB in the last 72 hours.  Invalid input(s): FREET3  Other results:   Imaging    No results found.   Medications:     Scheduled Medications: . atorvastatin  10 mg Oral Daily  . Chlorhexidine Gluconate Cloth  6 each Topical Daily  . insulin aspart  0-5 Units Subcutaneous QHS  . insulin aspart  0-9 Units Subcutaneous TID WC  . loratadine  10 mg Oral Daily  . midodrine  10 mg Oral TID WC  . montelukast  10 mg Oral QHS  . senna-docusate  1 tablet Oral Daily  . sodium chloride flush  10-40 mL Intracatheter Q12H  . sodium chloride flush  3 mL Intravenous Q12H    Infusions: .  prismasol BGK 4/2.5 500 mL/hr at 12/09/20 2137  .  prismasol BGK 4/2.5 300  mL/hr at 12/09/20 2137  . sodium chloride    . sodium chloride    . amiodarone 30 mg/hr (12/10/20 0700)  . heparin 1,350 Units/hr (12/10/20 0700)  . milrinone 0.125 mcg/kg/min (12/10/20 0700)  . norepinephrine (LEVOPHED) Adult infusion 2 mcg/min (12/10/20 0700)  . prismasol BGK 4/2.5 1,500 mL/hr at 12/10/20 0141    PRN Medications: sodium chloride, Place/Maintain arterial line **AND** sodium chloride, acetaminophen, heparin, ondansetron (ZOFRAN) IV, sodium chloride flush, sodium chloride flush    Assessment/Plan   1. Acute on chronic systolic CHF: Nonischemic cardiomyopathy.  Echo this admission with EF 25-30%, moderate LV dilation, mild LVH, moderate RVE with moderately decreased RV systolic function, severe biatrial enlargement, moderate-severe TR, IVC dilated. EF was low starting in 2016, cath at that time showed mild nonobstructive CAD.  Patient had runs of AF but not sustained when initial diagnosis was made.  Was readmitted in 2018 with persistent AF and CHF, echo with EF 20-25%.  Had DCCV back to NSR at that time.  EF up to 55-60% in 2019.  Lost to followup, now back with progressive dyspnea/volume overload and EF back down in setting of AF with RVR.  Difficult to identify initial etiology of CMP but cannot rule out tachy-mediated.  UDS negative for cocaine. 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  He does not feel AF and we are not sure how long he has been in it.  He is 30+ lbs up with marked volume overload and concern for low output HF (progressive rise in creatinine with attempts at diuresis).  Poor response to initiation of milrinone 0.25 then norepinephrine, creatinine continued to rise and CVVH started 5/12.  Weight coming down steadily. CVP 11. Continue CVVHD 150/hr. Co-ox 82%.  - On low dose NE to keep MAP up. Will increase midodrine to 12.5 tid. Can stop milrinone today.  - Will need eventual RHC, consider repeat coronary angiography if creatinine  improves significantly => HS-TnI slightly elevated with no trend so no ACS.  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   - S/p DCCV 5/16. Maintaining NSR  - Consider eventual cardiac MRI.  - Unna boots.  2. Atrial fibrillation: Admitted with afib/RVR, uncertain how long this has been present.  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  - S/p TEE/DCCV 5/16. Maintaining NSR.  - Continue amiodarone gtt 60 mg/hr.  - Continue on heparin gtt, eventually transition to Eliquis.  3. AKI: Uncertain baseline, creatinine 1.77 at arrival, creatinine up to 3.49 with poor diuresis and CVVH started with intractable volume overload. - Continue UF net 150 cc/hr today per Renal 4. Hematuria: Seen by urology => think old blood in urine, renal ultrasound ok.  Recommend continuing heparin gtt.  Has foley. No  further gross hematuria  5. Constipation - repeat sorbitol and add senocot. Get to chair 6. Iron-deficieny anemia - received feraheme 5/15  Length of Stay: [address], PA-C  12/10/2020, 7:07 AM  Advanced Heart Failure Team Pager [phone] (M-F; 7a - 5p)  Please contact [facility] for night-coverage after hours (5p -7a ) and weekends on [email or web address]  Patient seen with PA, agree with the above note.  CVVH running net UF 150 cc/hr.  Only 61 cc urine charted but more than that is in the back.  Hematuria noted though not gross (looks like old blood).  CVP 11 today.  Co-ox 82% on NE 2 + milrinone 0.125.  He remains in NSR after DCCV yesterday.  On heparin gtt + amiodarone 30.   Feels better overall, still on 8L HFNC.   General: NAD Neck: JVP 10-12 cm, no thyromegaly or thyroid nodule.  Lungs: Clear to auscultation bilaterally with normal respiratory effort. CV: Nondisplaced PMI.  Heart regular S1/S2, no S3/S4, 1/6 HSM LLSB. 2+ edema to knees.  Abdomen: Soft, nontender, no hepatosplenomegaly, no distention.  Skin: Intact without lesions or rashes.  Neurologic: Alert and oriented x 3.   Psych: Normal affect. Extremities: No clubbing or cyanosis.  HEENT: Normal.   Patient remains in NSR today on amiodarone gtt at 30 + heparin gtt. Important to maintain NSR given concern for tachy-mediated CMP as main cause of HF.  Eventually needs coronary angiography but will either need to recover renal function or be committed to long-term HD prior to this.  Eventual cardiac MRI.   Good co-ox today, can stop milrinone.  Can maintain low dose NE to facilitate CVVH.  He still has fairly marked peripheral edema and significant oxygen requirement though CVP coming down (11 today).  Would continue CVVH but can decrease rate to 100 cc/hr net UF today given falling CVP.  He is still making some urine, will have to follow for renal recovery over time.   CRITICAL CARE Performed by: Marca Ancona  Total critical care time: 35 minutes  Critical care time was exclusive of separately billable procedures and treating other patients.  Critical care was necessary to treat or prevent imminent or life-threatening deterioration.  Critical care was time spent personally by me on the following activities: development of treatment plan with patient and/or surrogate as well as nursing, discussions with consultants, evaluation of patient's response to treatment, examination of patient, obtaining history from patient or surrogate, ordering and performing treatments and interventions, ordering and review of laboratory studies, ordering and review of radiographic studies, pulse oximetry and re-evaluation of patient's condition.  Marca Ancona 12/10/2020 7:41 AM

## 2020-12-10 NOTE — Plan of Care (Signed)
  Problem: Education: Goal: Ability to verbalize understanding of medication therapies will improve Outcome: Progressing   

## 2020-12-10 NOTE — Progress Notes (Signed)
Initial Nutrition Assessment  DOCUMENTATION CODES:   Not applicable  INTERVENTION:   30 ml ProSource Plus BID, each supplement provides 100 kcals and 15 grams protein.   Ensure Enlive po BID, each supplement provides 350 kcal and 20 grams of protein  Add B complex with C  Change to 2g sodium, 1800 mL fluid restricted diet   NUTRITION DIAGNOSIS:   Increased nutrient needs related to acute illness as evidenced by estimated needs.  GOAL:   Patient will meet greater than or equal to 90% of their needs  MONITOR:   PO intake,Supplement acceptance,Labs,Weight trends  REASON FOR ASSESSMENT:   Rounds    ASSESSMENT:   65 yo male admitted with acute on chronic CHF, AKI requiring RRT. PMH includes COPD, CAD, CHF, HTN, AKI, CKD  5/12 CRRT initiated 2/2 massive volume overload 5/16 TEE, cardioversion  Recorded po intake 0-100% of meals. Pt reports appetite is good; ate good breakfast this AM. Pt has not touched lunch yet as he reports he just finished. Pt reports good appetite PTA.   Deep pitting edema; weight down to 122.3 kg (268 pounds). Admit weight 128.5 kg; highest weight 131.6 kg on 5/11  Pt reports UBW between 245-250 pounds  Labs: reviewed Meds: ss novolog, zofran prn, senokot-S    NUTRITION - FOCUSED PHYSICAL EXAM:  Flowsheet Row Most Recent Value  Orbital Region No depletion  Upper Arm Region Unable to assess  [edema]  Thoracic and Lumbar Region No depletion  Buccal Region No depletion  Temple Region No depletion  Clavicle Bone Region No depletion  Clavicle and Acromion Bone Region No depletion  Scapular Bone Region No depletion  Dorsal Hand Unable to assess  [edema]  Patellar Region Unable to assess  Anterior Thigh Region Unable to assess  Posterior Calf Region Unable to assess  Edema (RD Assessment) Severe       Diet Order:  HEART HEALTHJY  EDUCATION NEEDS:   Education needs have been addressed  Skin:  Skin Assessment: Reviewed RN  Assessment  Last BM:  5/17  Height:   Ht Readings from Last 1 Encounters:  12/02/20 6\' 1"  (1.854 m)    Weight:   Wt Readings from Last 1 Encounters:  12/09/20 122.3 kg    BMI:  Body mass index is 35.57 kg/m.  Estimated Nutritional Needs:   Kcal:  2100-2300 kcals  Protein:  120-140 g  Fluid:  1.8 L   12/11/20 MS, RDN, LDN, CNSC Registered Dietitian III Clinical Nutrition RD Pager and On-Call Pager Number Located in [place]

## 2020-12-10 NOTE — Progress Notes (Signed)
ANTICOAGULATION CONSULT NOTE - Follow Up Consult  Pharmacy Consult for heparin Indication: atrial fibrillation  Labs: Recent Labs    12/08/20 0352 12/08/20 1533 12/09/20 0341 12/09/20 1835 12/10/20 0317  HGB  --   --   --   --  10.7*  HCT  --   --   --   --  33.6*  PLT  --   --   --   --  157  HEPARINUNFRC 0.57  --  0.55  --  0.45  CREATININE 1.56*   < > 1.47* 1.69* 1.73*   < > = values in this interval not displayed.    Assessment/Plan:  65yo male on heparin for Afib while holding home Eliquis. Heparin level therapeutic this morning at 0.45. CBC stable. Pt s/p DCCV 5/16.  Goal of Therapy:  Heparin level 0.3-0.7 units/ml Monitor platelets by anticoagulation protocol: Yes  Plan: Continue heparin at 1350 units/h Daily heparin level and CBC  Fredonia Highland, PharmD, [place], [facility] Clinical Pharmacist [phone] Please check AMION for all [facility] Pharmacy numbers 12/10/2020  Please check [email or web address] for unit-specific pharmacy phone numbers.

## 2020-12-10 NOTE — Progress Notes (Signed)
Subjective: Patient states he feels about the same.  Had cardioversion yesterday got out of atrial fibrillation.  Went back into A. fib overnight but now sinus rhythm.  Tolerating CRRT with good ultrafiltration.  Urine output low.  Continues on norepinephrine and milrinone.   Objective Vital signs in last 24 hours: Vitals:   12/10/20 0700 12/10/20 0800 12/10/20 0830 12/10/20 0900  BP: 109/76 103/66 114/72 114/77  Pulse: 97 88 87 92  Resp: 11 17 18 11   Temp:  98 F (36.7 C)    TempSrc:  Oral    SpO2: 94% 95% 93% 95%  Weight:      Height:       Weight change:   Intake/Output Summary (Last 24 hours) at 12/10/2020 12/12/2020 Last data filed at 12/10/2020 0900 Gross per 24 hour  Intake 1118.68 ml  Output 3905 ml  Net -2786.32 ml    Assessment/ Plan: Pt is a 65 y.o. yo male -  crt 1.11 a year ago, who was admitted on 12/02/2020 with Afib and volume overload.   Initial crt 1.7 but increased in the setting of attempted diuresis and continued hemodyanamic instability  -  CRRT started on 5/12  1. Afib/cardiomyopathy/volume overload-  HF team involved-TEE with EF of 25%, moderately decreased RV function, mitral and tricuspid regurgitation.  Milrinone/ amio/ and norepi. Cardioversion completed.  Currently in normal sinus rhythm 2. AKI on CKD - baseline somewhere 1.1-1.7. Likely cardiorenal syndrome with ATN related to hemodynamic instability. CRRT started given volume overload.  Urine output continues to be low.  Continue CRRT: Fluid as able.  CVP 11 today 3. Anemia- reasonable-  Systemic heparin but no specific heparin with CRRT. CTM 4. HTN/volume- massive volume overload-  Achieving aggressive UF with good response. CTM 5. Hyponatremia: Sodium normalized to 135 for volume removal  7/12    Labs: Basic Metabolic Panel: Recent Labs  Lab 12/09/20 0341 12/09/20 1835 12/10/20 0317  NA 132* 135 135  K 4.3 4.3 4.5  CL 100 103 102  CO2 26 28 28   GLUCOSE 193* 86 99  BUN 17 18 17    CREATININE 1.47* 1.69* 1.73*  CALCIUM 8.8* 8.9 8.7*  PHOS 3.0 3.2 2.9   Liver Function Tests: Recent Labs  Lab 12/09/20 0341 12/09/20 1835 12/10/20 0317  ALBUMIN 2.8* 2.8* 2.8*   No results for input(s): LIPASE, AMYLASE in the last 168 hours. No results for input(s): AMMONIA in the last 168 hours. CBC: Recent Labs  Lab 12/04/20 0305 12/05/20 0426 12/06/20 0433 12/07/20 0426 12/10/20 0317  WBC 9.9 8.8 9.8 7.3 11.6*  HGB 11.3* 9.8* 10.9* 10.4* 10.7*  HCT 37.8* 32.6* 34.4* 32.2* 33.6*  MCV 77.1* 76.7* 73.0* 72.7* 73.5*  PLT 231 197 197 157 157   Cardiac Enzymes: No results for input(s): CKTOTAL, CKMB, CKMBINDEX, TROPONINI in the last 168 hours. CBG: Recent Labs  Lab 12/09/20 1120 12/09/20 1838 12/09/20 2128 12/10/20 0617 12/10/20 0745  GLUCAP 103* 97 97 105* 86    Iron Studies:  No results for input(s): IRON, TIBC, TRANSFERRIN, FERRITIN in the last 72 hours. Studies/Results: No results found. Medications: Infusions: .  prismasol BGK 4/2.5 500 mL/hr at 12/09/20 2137  .  prismasol BGK 4/2.5 300 mL/hr at 12/09/20 2137  . sodium chloride    . sodium chloride    . amiodarone 30 mg/hr (12/10/20 0900)  . heparin 1,350 Units/hr (12/10/20 0900)  . norepinephrine (LEVOPHED) Adult infusion 1 mcg/min (12/10/20 0900)  . prismasol BGK 4/2.5 1,500 mL/hr at  12/10/20 0749    Scheduled Medications: . atorvastatin  10 mg Oral Daily  . Chlorhexidine Gluconate Cloth  6 each Topical Daily  . insulin aspart  0-5 Units Subcutaneous QHS  . insulin aspart  0-9 Units Subcutaneous TID WC  . loratadine  10 mg Oral Daily  . midodrine  12.5 mg Oral TID WC  . montelukast  10 mg Oral QHS  . senna-docusate  1 tablet Oral BID  . sodium chloride flush  10-40 mL Intracatheter Q12H  . sodium chloride flush  3 mL Intravenous Q12H    have reviewed scheduled and prn medications.  Physical Exam: General: lying in bed no destress Heart: tachy, irreg Lungs: dec BS at bases, tachypnea, no  iwob Abdomen: obese, non tender Extremities: pitting edema throughout Dialysis Access: left IJ cath placed 5/12 by CCM    12/10/2020,9:28 AM  LOS: 8 days

## 2020-12-10 NOTE — Procedures (Signed)
I saw the patient on CRRT. I have reviewed the 24hr data and made appropriate changes. On norepi and milrinone. Remains overloaded. Tolerating UF. Low UOP  Filed Weights   12/05/20 0408 12/08/20 0257 12/09/20 0500  Weight: 131.4 kg 127.4 kg 122.3 kg    Recent Labs  Lab 12/10/20 0317  NA 135  K 4.5  CL 102  CO2 28  GLUCOSE 99  BUN 17  CREATININE 1.73*  CALCIUM 8.7*  PHOS 2.9    Recent Labs  Lab 12/06/20 0433 12/07/20 0426 12/10/20 0317  WBC 9.8 7.3 11.6*  HGB 10.9* 10.4* 10.7*  HCT 34.4* 32.2* 33.6*  MCV 73.0* 72.7* 73.5*  PLT 197 157 157    Scheduled Meds: . atorvastatin  10 mg Oral Daily  . B-complex with vitamin C  1 tablet Oral Daily  . Chlorhexidine Gluconate Cloth  6 each Topical Daily  . insulin aspart  0-5 Units Subcutaneous QHS  . insulin aspart  0-9 Units Subcutaneous TID WC  . loratadine  10 mg Oral Daily  . midodrine  12.5 mg Oral TID WC  . montelukast  10 mg Oral QHS  . senna-docusate  1 tablet Oral BID  . sodium chloride flush  10-40 mL Intracatheter Q12H  . sodium chloride flush  3 mL Intravenous Q12H   Continuous Infusions: .  prismasol BGK 4/2.5 500 mL/hr at 12/09/20 2137  .  prismasol BGK 4/2.5 300 mL/hr at 12/09/20 2137  . sodium chloride    . sodium chloride    . amiodarone 30 mg/hr (12/10/20 1100)  . heparin 1,350 Units/hr (12/10/20 1100)  . norepinephrine (LEVOPHED) Adult infusion 2 mcg/min (12/10/20 1100)  . prismasol BGK 4/2.5 1,500 mL/hr at 12/10/20 1105   PRN Meds:.sodium chloride, Place/Maintain arterial line **AND** sodium chloride, acetaminophen, heparin, ondansetron (ZOFRAN) IV, sodium chloride flush, sodium chloride flush   Louie Bun,  MD 12/10/2020, 11:31 AM

## 2020-12-11 ENCOUNTER — Inpatient Hospital Stay (HOSPITAL_COMMUNITY): Payer: Medicare Other

## 2020-12-11 DIAGNOSIS — I5043 Acute on chronic combined systolic (congestive) and diastolic (congestive) heart failure: Secondary | ICD-10-CM | POA: Diagnosis not present

## 2020-12-11 LAB — RENAL FUNCTION PANEL
Albumin: 2.6 g/dL — ABNORMAL LOW (ref 3.5–5.0)
Albumin: 2.3 g/dL — ABNORMAL LOW (ref 3.5–5.0)
Anion gap: 4 — ABNORMAL LOW (ref 5–15)
Anion gap: 3 — ABNORMAL LOW (ref 5–15)
BUN: 22 mg/dL (ref 8–23)
BUN: 35 mg/dL — ABNORMAL HIGH (ref 8–23)
CO2: 28 mmol/L (ref 22–32)
CO2: 28 mmol/L (ref 22–32)
Calcium: 8.7 mg/dL — ABNORMAL LOW (ref 8.9–10.3)
Calcium: 8 mg/dL — ABNORMAL LOW (ref 8.9–10.3)
Chloride: 104 mmol/L (ref 98–111)
Chloride: 107 mmol/L (ref 98–111)
Creatinine, Ser: 1.47 mg/dL — ABNORMAL HIGH (ref 0.61–1.24)
Creatinine, Ser: 1.75 mg/dL — ABNORMAL HIGH (ref 0.61–1.24)
GFR, Estimated: 52 mL/min — ABNORMAL LOW (ref >60)
GFR, Estimated: 42 mL/min — ABNORMAL LOW (ref >60)
Glucose, Bld: 92 mg/dL (ref 70–99)
Glucose, Bld: 106 mg/dL — ABNORMAL HIGH (ref 70–99)
Phosphorus: 2.4 mg/dL — ABNORMAL LOW (ref 2.5–4.6)
Phosphorus: 2.5 mg/dL (ref 2.5–4.6)
Potassium: 4.1 mmol/L (ref 3.5–5.1)
Potassium: 4.7 mmol/L (ref 3.5–5.1)
Sodium: 136 mmol/L (ref 135–145)
Sodium: 138 mmol/L (ref 135–145)

## 2020-12-11 LAB — GLUCOSE, CAPILLARY
Glucose-Capillary: 99 mg/dL (ref 70–99)
Glucose-Capillary: 88 mg/dL (ref 70–99)
Glucose-Capillary: 99 mg/dL (ref 70–99)
Glucose-Capillary: 93 mg/dL (ref 70–99)

## 2020-12-11 LAB — CBC
HCT: 34.9 % — ABNORMAL LOW (ref 39.0–52.0)
Hemoglobin: 10.6 g/dL — ABNORMAL LOW (ref 13.0–17.0)
MCH: 23 pg — ABNORMAL LOW (ref 26.0–34.0)
MCHC: 30.4 g/dL (ref 30.0–36.0)
MCV: 75.7 fL — ABNORMAL LOW (ref 80.0–100.0)
Platelets: 164 10*3/uL (ref 150–400)
RBC: 4.61 MIL/uL (ref 4.22–5.81)
RDW: 18.4 % — ABNORMAL HIGH (ref 11.5–15.5)
WBC: 8.1 10*3/uL (ref 4.0–10.5)
nRBC: 0 % (ref 0.0–0.2)

## 2020-12-11 LAB — COOXEMETRY PANEL
Carboxyhemoglobin: 0.8 % (ref 0.5–1.5)
Methemoglobin: 0.9 % (ref 0.0–1.5)
O2 Saturation: 61.6 %
Total hemoglobin: 11 g/dL — ABNORMAL LOW (ref 12.0–16.0)

## 2020-12-11 LAB — HEPARIN LEVEL (UNFRACTIONATED)
Heparin Unfractionated: >1.1 IU/mL — ABNORMAL HIGH (ref 0.30–0.70)
Heparin Unfractionated: 0.37 IU/mL (ref 0.30–0.70)

## 2020-12-11 LAB — MAGNESIUM: Magnesium: 2.4 mg/dL (ref 1.7–2.4)

## 2020-12-11 MED ORDER — FUROSEMIDE 10 MG/ML IJ SOLN
160.0000 mg | Freq: Once | INTRAVENOUS | Status: AC
  Administered 2020-12-11: 160 mg via INTRAVENOUS
  Filled 2020-12-11: qty 16

## 2020-12-11 MED ORDER — K PHOS MONO-SOD PHOS DI & MONO 155-852-130 MG PO TABS
500.0000 mg | ORAL_TABLET | Freq: Once | ORAL | Status: AC
  Administered 2020-12-11: 500 mg via ORAL
  Filled 2020-12-11: qty 2

## 2020-12-11 MED ORDER — POTASSIUM & SODIUM PHOSPHATES 280-160-250 MG PO PACK
2.0000 | PACK | Freq: Once | ORAL | Status: DC
  Filled 2020-12-11: qty 2

## 2020-12-11 NOTE — Procedures (Signed)
I saw the patient on CRRT. I have reviewed the 24hr data and made appropriate changes. On minimal norepi. Remains overloaded. Tolerating UF but will increase. Hope to transition to HD in several days or possible renal recovery.  Filed Weights   12/05/20 0408 12/08/20 0257 12/09/20 0500  Weight: 131.4 kg 127.4 kg 122.3 kg    Recent Labs  Lab 12/11/20 0302  NA 136  K 4.1  CL 104  CO2 28  GLUCOSE 92  BUN 22  CREATININE 1.47*  CALCIUM 8.7*  PHOS 2.4*    Recent Labs  Lab 12/07/20 0426 12/10/20 0317 12/11/20 0302  WBC 7.3 11.6* 8.1  HGB 10.4* 10.7* 10.6*  HCT 32.2* 33.6* 34.9*  MCV 72.7* 73.5* 75.7*  PLT 157 157 164    Scheduled Meds: . (feeding supplement) PROSource Plus  30 mL Oral BID BM  . atorvastatin  10 mg Oral Daily  . B-complex with vitamin C  1 tablet Oral Daily  . Chlorhexidine Gluconate Cloth  6 each Topical Daily  . feeding supplement  237 mL Oral BID BM  . insulin aspart  0-5 Units Subcutaneous QHS  . insulin aspart  0-9 Units Subcutaneous TID WC  . loratadine  10 mg Oral Daily  . midodrine  12.5 mg Oral TID WC  . montelukast  10 mg Oral QHS  . pantoprazole  40 mg Oral Daily  . senna-docusate  1 tablet Oral BID  . sodium chloride flush  10-40 mL Intracatheter Q12H  . sodium chloride flush  3 mL Intravenous Q12H   Continuous Infusions: .  prismasol BGK 4/2.5 500 mL/hr at 12/11/20 0626  .  prismasol BGK 4/2.5 300 mL/hr at 12/11/20 0626  . sodium chloride    . sodium chloride    . amiodarone 30 mg/hr (12/11/20 0800)  . heparin 1,350 Units/hr (12/11/20 0800)  . norepinephrine (LEVOPHED) Adult infusion 1 mcg/min (12/11/20 0800)  . prismasol BGK 4/2.5 1,500 mL/hr at 12/11/20 0626   PRN Meds:.sodium chloride, Place/Maintain arterial line **AND** sodium chloride, acetaminophen, heparin, ondansetron (ZOFRAN) IV, sodium chloride flush, sodium chloride flush   Louie Bun,  MD 12/11/2020, 9:26 AM

## 2020-12-11 NOTE — Progress Notes (Signed)
Subjective: Patient feels fairly well today.  Minimal norepinephrine at this point.  Urine output better today at 530 cc.  Continues to be volume overloaded.  CVP estimated to be around 15.   Objective Vital signs in last 24 hours: Vitals:   12/11/20 0730 12/11/20 0800 12/11/20 0815 12/11/20 0830  BP: 128/64 106/74 110/64 132/77  Pulse: (!) 42 (!) 46 (!) 39 (!) 125  Resp: 19 15 16 16   Temp:      TempSrc:      SpO2: 100% 100% 100% 100%  Weight:      Height:       Weight change:   Intake/Output Summary (Last 24 hours) at 12/11/2020 12/13/2020 Last data filed at 12/11/2020 0800 Gross per 24 hour  Intake 1268.8 ml  Output 2230 ml  Net -961.2 ml    Assessment/ Plan: Pt is a 65 y.o. yo male -  crt 1.11 a year ago, who was admitted on 12/02/2020 with Afib and volume overload.   Initial crt 1.7 but increased in the setting of attempted diuresis and continued hemodyanamic instability  -  CRRT started on 5/12  1. Afib/cardiomyopathy/volume overload-  HF team involved-TEE with EF of 25%, moderately decreased RV function, mitral and tricuspid regurgitation.  Now only on nor epi. Cardioversion completed.  Continues to be volume overloaded. 2. AKI on CKD - baseline somewhere 1.1-1.7. Likely cardiorenal syndrome with ATN related to hemodynamic instability. CRRT started given volume overload.  Continue CRRT for now.  Lasix challenge with IV Lasix 160 mg.  As hemodynamics improved can transition to hemodialysis or hope for renal recovery 3. Anemia- reasonable-  Systemic heparin but no specific heparin with CRRT. CTM 4. HTN/volume- massive volume overload-UF fairly good but remains volume overloaded.  Lasix challenge as above.  Increase ultrafiltration to 200 to 250/h as tolerated.  Could consider standing Lasix to help with diuresis 5. Hyponatremia: Sodium normalized to 135 for volume removal 6.  Hypophosphatemia: Replacement as needed  7/12    Labs: Basic Metabolic Panel: Recent Labs  Lab  12/10/20 0317 12/10/20 1803 12/11/20 0302  NA 135 134* 136  K 4.5 4.5 4.1  CL 102 102 104  CO2 28 26 28   GLUCOSE 99 112* 92  BUN 17 19 22   CREATININE 1.73* 1.69* 1.47*  CALCIUM 8.7* 9.1 8.7*  PHOS 2.9 2.7 2.4*   Liver Function Tests: Recent Labs  Lab 12/10/20 0317 12/10/20 1803 12/11/20 0302  ALBUMIN 2.8* 2.9* 2.6*   No results for input(s): LIPASE, AMYLASE in the last 168 hours. No results for input(s): AMMONIA in the last 168 hours. CBC: Recent Labs  Lab 12/05/20 0426 12/06/20 0433 12/07/20 0426 12/10/20 0317 12/11/20 0302  WBC 8.8 9.8 7.3 11.6* 8.1  HGB 9.8* 10.9* 10.4* 10.7* 10.6*  HCT 32.6* 34.4* 32.2* 33.6* 34.9*  MCV 76.7* 73.0* 72.7* 73.5* 75.7*  PLT 197 197 157 157 164   Cardiac Enzymes: No results for input(s): CKTOTAL, CKMB, CKMBINDEX, TROPONINI in the last 168 hours. CBG: Recent Labs  Lab 12/10/20 0617 12/10/20 0745 12/10/20 1132 12/10/20 1558 12/11/20 0737  GLUCAP 105* 86 91 126* 99    Iron Studies:  No results for input(s): IRON, TIBC, TRANSFERRIN, FERRITIN in the last 72 hours. Studies/Results: No results found. Medications: Infusions: .  prismasol BGK 4/2.5 500 mL/hr at 12/11/20 0626  .  prismasol BGK 4/2.5 300 mL/hr at 12/11/20 0626  . sodium chloride    . sodium chloride    . amiodarone 30 mg/hr (  12/11/20 0800)  . furosemide 160 mg (12/11/20 0823)  . heparin 1,350 Units/hr (12/11/20 0800)  . norepinephrine (LEVOPHED) Adult infusion 1 mcg/min (12/11/20 0800)  . prismasol BGK 4/2.5 1,500 mL/hr at 12/11/20 1950    Scheduled Medications: . (feeding supplement) PROSource Plus  30 mL Oral BID BM  . atorvastatin  10 mg Oral Daily  . B-complex with vitamin C  1 tablet Oral Daily  . Chlorhexidine Gluconate Cloth  6 each Topical Daily  . feeding supplement  237 mL Oral BID BM  . insulin aspart  0-5 Units Subcutaneous QHS  . insulin aspart  0-9 Units Subcutaneous TID WC  . loratadine  10 mg Oral Daily  . midodrine  12.5 mg Oral TID  WC  . montelukast  10 mg Oral QHS  . pantoprazole  40 mg Oral Daily  . senna-docusate  1 tablet Oral BID  . sodium chloride flush  10-40 mL Intracatheter Q12H  . sodium chloride flush  3 mL Intravenous Q12H    have reviewed scheduled and prn medications.  Physical Exam: General: lying in bed no destress Heart: Normal rate Lungs: Bilateral chest rise with no increased work of breathing Abdomen: obese, non tender Extremities: pitting edema throughout, warm and well perfused Dialysis Access: left IJ cath placed 5/12 by CCM    12/11/2020,9:22 AM  LOS: 9 days

## 2020-12-11 NOTE — Plan of Care (Signed)
?  Problem: Activity: ?Goal: Capacity to carry out activities will improve ?Outcome: Progressing ?  ?

## 2020-12-11 NOTE — Progress Notes (Addendum)
ANTICOAGULATION CONSULT NOTE - Follow Up Consult  Pharmacy Consult for heparin Indication: atrial fibrillation  Labs: Recent Labs    12/10/20 0317 12/10/20 1803 12/11/20 0302 12/11/20 0437  HGB 10.7*  --  10.6*  --   HCT 33.6*  --  34.9*  --   PLT 157  --  164  --   HEPARINUNFRC 0.45  --  >1.10* 0.37  CREATININE 1.73* 1.69* 1.47*  --     Assessment/Plan:  65 yo male on heparin for Afib while holding home Eliquis. Initial heparin level this morning (>1.1) drawn from site of heparin infusion. Upon redraw from trialysis catheter, heparin level therapeutic this morning at 0.37. CBC stable. Pt s/p DCCV 5/16.  Goal of Therapy:  Heparin level 0.3-0.7 units/ml Monitor platelets by anticoagulation protocol: Yes  Plan: Continue heparin at 1350 units/h Daily heparin level and CBC  Calton Dach, PharmD PGY1 Pharmacy Resident 12/11/2020 6:49 AM  Please check AMION for all [facility] Pharmacy numbers

## 2020-12-11 NOTE — Progress Notes (Addendum)
Patient ID: Kevin [name], male   DOB: Jan 23, 1956, 65 y.o.   MRN: [id number]     Advanced Heart Failure Rounding Note  PCP-Cardiologist: Chrystie Nose, MD   Subjective:    CVVH started 5/12 with intractable volume overload.   TEE/DCCV>>NSR 5/16  He remains in NSR w/ PACs after cardioversion   Continues w/ CVVH. 2L off yesterday. Still markedly fluid overloaded. CVP 15.   Off milrinone. Continues on NE 1 for BP support. Co-ox 62%   Scr down 1.73>>1.69>>1.47.   Feels ok this morning. No complaints.   TEE: Moderate LV dilation with EF 25%, moderately decreased RV function, moderate functional MR, mod-severe TR.    Objective:   Weight Range: 122.3 kg Body mass index is 35.57 kg/m.   Vital Signs:   Temp:  [98 F (36.7 C)-99 F (37.2 C)] 98.6 F (37 C) (05/18 0700) Pulse Rate:  [42-115] 42 (05/18 0730) Resp:  [9-25] 19 (05/18 0730) BP: (83-145)/(53-100) 128/64 (05/18 0730) SpO2:  [92 %-100 %] 100 % (05/18 0730) Last BM Date: 12/10/20  Weight change: Filed Weights   12/05/20 0408 12/08/20 0257 12/09/20 0500  Weight: 131.4 kg 127.4 kg 122.3 kg    Intake/Output:   Intake/Output Summary (Last 24 hours) at 12/11/2020 0749 Last data filed at 12/11/2020 0600 Gross per 24 hour  Intake 1271.92 ml  Output 2630 ml  Net -1358.08 ml      Physical Exam    PHYSICAL EXAM: CVP 15 General:  Well appearing, sitting up in bed. No respiratory difficulty HEENT: normal Neck: supple. no JVD elevated to ear. Carotids 2+ bilat; no bruits. No lymphadenopathy or thyromegaly appreciated. Cor: PMI nondisplaced. Regular rate & rhythm. No rubs, gallops or murmurs. Lungs: clear, no wheezing  Abdomen: soft, nontender, nondistended. No hepatosplenomegaly. No bruits or masses. Good bowel sounds. Extremities: no cyanosis, clubbing, rash, 2+ LE edema up to thighs  Neuro: alert & oriented x 3, cranial nerves grossly intact. moves all 4 extremities w/o difficulty. Affect  pleasant.    Telemetry   NSR 80s w/ occasional PACs Personally reviewed  Labs    CBC Recent Labs    12/10/20 0317 12/11/20 0302  WBC 11.6* 8.1  HGB 10.7* 10.6*  HCT 33.6* 34.9*  MCV 73.5* 75.7*  PLT 157 164   Basic Metabolic Panel Recent Labs    77/82/42 0317 12/10/20 1803 12/11/20 0302  NA 135 134* 136  K 4.5 4.5 4.1  CL 102 102 104  CO2 28 26 28   GLUCOSE 99 112* 92  BUN 17 19 22   CREATININE 1.73* 1.69* 1.47*  CALCIUM 8.7* 9.1 8.7*  MG 2.4  --  2.4  PHOS 2.9 2.7 2.4*   Liver Function Tests Recent Labs    12/10/20 1803 12/11/20 0302  ALBUMIN 2.9* 2.6*   No results for input(s): LIPASE, AMYLASE in the last 72 hours. Cardiac Enzymes No results for input(s): CKTOTAL, CKMB, CKMBINDEX, TROPONINI in the last 72 hours.  BNP: BNP (last 3 results) Recent Labs    12/02/20 0915  BNP 1,158.5*    ProBNP (last 3 results) No results for input(s): PROBNP in the last 8760 hours.   D-Dimer No results for input(s): DDIMER in the last 72 hours. Hemoglobin A1C No results for input(s): HGBA1C in the last 72 hours. Fasting Lipid Panel No results for input(s): CHOL, HDL, LDLCALC, TRIG, CHOLHDL, LDLDIRECT in the last 72 hours. Thyroid Function Tests No results for input(s): TSH, T4TOTAL, T3FREE, THYROIDAB in the last 72 hours.  Invalid  input(s): FREET3  Other results:   Imaging    No results found.   Medications:     Scheduled Medications: . (feeding supplement) PROSource Plus  30 mL Oral BID BM  . atorvastatin  10 mg Oral Daily  . B-complex with vitamin C  1 tablet Oral Daily  . Chlorhexidine Gluconate Cloth  6 each Topical Daily  . feeding supplement  237 mL Oral BID BM  . insulin aspart  0-5 Units Subcutaneous QHS  . insulin aspart  0-9 Units Subcutaneous TID WC  . loratadine  10 mg Oral Daily  . midodrine  12.5 mg Oral TID WC  . montelukast  10 mg Oral QHS  . pantoprazole  40 mg Oral Daily  . phosphorus  500 mg Oral Once  . senna-docusate  1  tablet Oral BID  . sodium chloride flush  10-40 mL Intracatheter Q12H  . sodium chloride flush  3 mL Intravenous Q12H    Infusions: .  prismasol BGK 4/2.5 500 mL/hr at 12/11/20 0626  .  prismasol BGK 4/2.5 300 mL/hr at 12/11/20 0626  . sodium chloride    . sodium chloride    . amiodarone 30 mg/hr (12/11/20 0600)  . furosemide    . heparin 1,350 Units/hr (12/11/20 0600)  . norepinephrine (LEVOPHED) Adult infusion 1 mcg/min (12/11/20 0600)  . prismasol BGK 4/2.5 1,500 mL/hr at 12/11/20 0626    PRN Medications: sodium chloride, Place/Maintain arterial line **AND** sodium chloride, acetaminophen, heparin, ondansetron (ZOFRAN) IV, sodium chloride flush, sodium chloride flush    Assessment/Plan   1. Acute on chronic systolic CHF: Nonischemic cardiomyopathy.  Echo this admission with EF 25-30%, moderate LV dilation, mild LVH, moderate RVE with moderately decreased RV systolic function, severe biatrial enlargement, moderate-severe TR, IVC dilated. EF was low starting in 2016, cath at that time showed mild nonobstructive CAD.  Patient had runs of AF but not sustained when initial diagnosis was made.  Was readmitted in 2018 with persistent AF and CHF, echo with EF 20-25%.  Had DCCV back to NSR at that time.  EF up to 55-60% in 2019.  Lost to followup, now back with progressive dyspnea/volume overload and EF back down in setting of AF with RVR.  Difficult to identify initial etiology of CMP but cannot rule out tachy-mediated.  UDS negative for cocaine. 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  He does not feel AF and we are not sure how long he has been in it.  He is 30+ lbs up with marked volume overload and concern for low output HF (progressive rise in creatinine with attempts at diuresis).  Poor response to initiation of milrinone 0.25 then norepinephrine, creatinine continued to rise and CVVH started 5/12. Now off milrinone. On NE for BP support, co-ox 62%. Remains fluid  overloaded, CVP 15. - Continue CVVH, may need to increase pull rate. Defer to nephrology  - Plan lasix challenge today per nephro, IV Lasix 160 mg x 1  - Will need eventual RHC, consider repeat coronary angiography if creatinine improves significantly => HS-TnI slightly elevated with no trend so no ACS.  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   - S/p DCCV 5/16. Maintaining NSR  - Consider eventual cardiac MRI.  - Unna boots.  2. Atrial fibrillation: Admitted with afib/RVR, uncertain how long this has been present.  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  - S/p TEE/DCCV 5/16. Maintaining NSR.  - Continue amiodarone gtt 30 mg/hr.  - Continue on heparin gtt,  eventually transition to Eliquis.  3. AKI: Uncertain baseline, creatinine 1.77 at arrival, creatinine up to 3.49 with poor diuresis and CVVH started with intractable volume overload. Scr improving, 1.47 - Continue UF per Renal 4. Hematuria: Seen by urology => think old blood in urine, renal ultrasound ok.  Recommend continuing heparin gtt.  Has foley. No further gross hematuria  5. Constipation - resolved 6. Iron-deficieny anemia - received feraheme 5/15  Length of Stay: [address] PA-C  12/11/2020, 7:49 AM  Advanced Heart Failure Team Pager [phone] (M-F; 7a - 5p)  Please contact [facility] for night-coverage after hours (5p -7a ) and weekends on [email or web address]  Patient seen with PA, agree with the above note.   CVVH running net UF 100 cc/hr.  530 cc UOP yesterday.  CVP 15 today.  Co-ox 62% on NE 1.  He remains in NSR after DCCV.  On heparin gtt + amiodarone 30.   Feels better overall, still on 8L HFNC. No weight yet today.   General: NAD Neck: JVP 14, no thyromegaly or thyroid nodule.  Lungs: Clear to auscultation bilaterally with normal respiratory effort. CV: Nonpalpable PMI.  Heart regular S1/S2, no S3/S4, 2/6 HSM LLSB.  2+ edema to knees. Abdomen: Soft, nontender, no hepatosplenomegaly, no  distention.  Skin: Intact without lesions or rashes.  Neurologic: Alert and oriented x 3.  Psych: Normal affect. Extremities: No clubbing or cyanosis.  HEENT: Normal.   Patient remains in NSR today on amiodarone gtt at 30 + heparin gtt. Important to maintain NSR given concern for tachy-mediated CMP as main cause of HF.  Eventually needs coronary angiography but will either need to recover renal function or be committed to long-term HD prior to this.  Eventual cardiac MRI.   Good co-ox today off milrinone.  Can maintain low dose NE to facilitate CVVH.  He still has fairly marked peripheral edema and significant oxygen requirement with CVP 15.  Would continue CVVH around 150 cc/hr net UF today.  He is still making some urine, will have to follow for renal recovery over time.  Nephrology giving Lasix challenge today.   CRITICAL CARE Performed by: Marca Ancona  Total critical care time: 35 minutes  Critical care time was exclusive of separately billable procedures and treating other patients.  Critical care was necessary to treat or prevent imminent or life-threatening deterioration.  Critical care was time spent personally by me on the following activities: development of treatment plan with patient and/or surrogate as well as nursing, discussions with consultants, evaluation of patient's response to treatment, examination of patient, obtaining history from patient or surrogate, ordering and performing treatments and interventions, ordering and review of laboratory studies, ordering and review of radiographic studies, pulse oximetry and re-evaluation of patient's condition.  Marca Ancona 12/11/2020 9:37 AM

## 2020-12-12 DIAGNOSIS — N179 Acute kidney failure, unspecified: Secondary | ICD-10-CM | POA: Diagnosis not present

## 2020-12-12 DIAGNOSIS — I5043 Acute on chronic combined systolic (congestive) and diastolic (congestive) heart failure: Secondary | ICD-10-CM | POA: Diagnosis not present

## 2020-12-12 LAB — RENAL FUNCTION PANEL
Albumin: 2.2 g/dL — ABNORMAL LOW (ref 3.5–5.0)
Albumin: 2.6 g/dL — ABNORMAL LOW (ref 3.5–5.0)
Anion gap: 6 (ref 5–15)
Anion gap: 7 (ref 5–15)
BUN: 36 mg/dL — ABNORMAL HIGH (ref 8–23)
BUN: 36 mg/dL — ABNORMAL HIGH (ref 8–23)
CO2: 26 mmol/L (ref 22–32)
CO2: 27 mmol/L (ref 22–32)
Calcium: 8 mg/dL — ABNORMAL LOW (ref 8.9–10.3)
Calcium: 8.3 mg/dL — ABNORMAL LOW (ref 8.9–10.3)
Chloride: 102 mmol/L (ref 98–111)
Chloride: 102 mmol/L (ref 98–111)
Creatinine, Ser: 1.59 mg/dL — ABNORMAL HIGH (ref 0.61–1.24)
Creatinine, Ser: 1.65 mg/dL — ABNORMAL HIGH (ref 0.61–1.24)
GFR, Estimated: 48 mL/min — ABNORMAL LOW (ref >60)
GFR, Estimated: 46 mL/min — ABNORMAL LOW (ref >60)
Glucose, Bld: 181 mg/dL — ABNORMAL HIGH (ref 70–99)
Glucose, Bld: 101 mg/dL — ABNORMAL HIGH (ref 70–99)
Phosphorus: 2.5 mg/dL (ref 2.5–4.6)
Phosphorus: 3.3 mg/dL (ref 2.5–4.6)
Potassium: 4.3 mmol/L (ref 3.5–5.1)
Potassium: 4.1 mmol/L (ref 3.5–5.1)
Sodium: 134 mmol/L — ABNORMAL LOW (ref 135–145)
Sodium: 136 mmol/L (ref 135–145)

## 2020-12-12 LAB — CBC
HCT: 26.8 % — ABNORMAL LOW (ref 39.0–52.0)
HCT: 29.1 % — ABNORMAL LOW (ref 39.0–52.0)
Hemoglobin: 8.4 g/dL — ABNORMAL LOW (ref 13.0–17.0)
Hemoglobin: 9.1 g/dL — ABNORMAL LOW (ref 13.0–17.0)
MCH: 23.3 pg — ABNORMAL LOW (ref 26.0–34.0)
MCH: 23.2 pg — ABNORMAL LOW (ref 26.0–34.0)
MCHC: 31.3 g/dL (ref 30.0–36.0)
MCHC: 31.3 g/dL (ref 30.0–36.0)
MCV: 74.4 fL — ABNORMAL LOW (ref 80.0–100.0)
MCV: 74 fL — ABNORMAL LOW (ref 80.0–100.0)
Platelets: 129 10*3/uL — ABNORMAL LOW (ref 150–400)
Platelets: 137 10*3/uL — ABNORMAL LOW (ref 150–400)
RBC: 3.6 MIL/uL — ABNORMAL LOW (ref 4.22–5.81)
RBC: 3.93 MIL/uL — ABNORMAL LOW (ref 4.22–5.81)
RDW: 18.2 % — ABNORMAL HIGH (ref 11.5–15.5)
RDW: 18.4 % — ABNORMAL HIGH (ref 11.5–15.5)
WBC: 7.9 10*3/uL (ref 4.0–10.5)
WBC: 8.7 10*3/uL (ref 4.0–10.5)
nRBC: 0 % (ref 0.0–0.2)
nRBC: 0 % (ref 0.0–0.2)

## 2020-12-12 LAB — COOXEMETRY PANEL
Carboxyhemoglobin: 0.8 % (ref 0.5–1.5)
Methemoglobin: 1.3 % (ref 0.0–1.5)
O2 Saturation: 50.2 %
Total hemoglobin: 9.9 g/dL — ABNORMAL LOW (ref 12.0–16.0)

## 2020-12-12 LAB — HEPARIN LEVEL (UNFRACTIONATED)
Heparin Unfractionated: >1.1 IU/mL — ABNORMAL HIGH (ref 0.30–0.70)
Heparin Unfractionated: 0.49 IU/mL (ref 0.30–0.70)

## 2020-12-12 LAB — MAGNESIUM: Magnesium: 2.3 mg/dL (ref 1.7–2.4)

## 2020-12-12 LAB — GLUCOSE, CAPILLARY
Glucose-Capillary: 98 mg/dL (ref 70–99)
Glucose-Capillary: 87 mg/dL (ref 70–99)
Glucose-Capillary: 94 mg/dL (ref 70–99)
Glucose-Capillary: 85 mg/dL (ref 70–99)

## 2020-12-12 MED ORDER — MIDODRINE HCL 5 MG PO TABS: 15.0000 mg | ORAL_TABLET | Freq: Three times a day (TID) | ORAL | Status: DC

## 2020-12-12 MED ORDER — MIDODRINE HCL 5 MG PO TABS
15.0000 mg | ORAL_TABLET | Freq: Three times a day (TID) | ORAL | Status: DC
  Administered 2020-12-12 – 2020-12-14 (×9): 15 mg via ORAL
  Filled 2020-12-12 (×9): qty 3

## 2020-12-12 MED ORDER — FUROSEMIDE 10 MG/ML IJ SOLN
160.0000 mg | Freq: Once | INTRAVENOUS | Status: AC
  Administered 2020-12-12: 160 mg via INTRAVENOUS
  Filled 2020-12-12: qty 2

## 2020-12-12 MED ORDER — SODIUM PHOSPHATES 45 MMOLE/15ML IV SOLN
20.0000 mmol | Freq: Once | INTRAVENOUS | Status: AC
  Administered 2020-12-12: 20 mmol via INTRAVENOUS
  Filled 2020-12-12: qty 6.67

## 2020-12-12 NOTE — Plan of Care (Signed)
?  Problem: Education: ?Goal: Ability to demonstrate management of disease process will improve ?Outcome: Progressing ?Goal: Ability to verbalize understanding of medication therapies will improve ?Outcome: Progressing ?Goal: Individualized Educational Video(s) ?Outcome: Progressing ?  ?Problem: Activity: ?Goal: Capacity to carry out activities will improve ?Outcome: Progressing ?  ?Problem: Cardiac: ?Goal: Ability to achieve and maintain adequate cardiopulmonary perfusion will improve ?Outcome: Progressing ?  ?Problem: Education: ?Goal: Knowledge of General Education information will improve ?Description: Including pain rating scale, medication(s)/side effects and non-pharmacologic comfort measures ?Outcome: Progressing ?  ?Problem: Health Behavior/Discharge Planning: ?Goal: Ability to manage health-related needs will improve ?Outcome: Progressing ?  ?Problem: Clinical Measurements: ?Goal: Ability to maintain clinical measurements within normal limits will improve ?Outcome: Progressing ?Goal: Will remain free from infection ?Outcome: Progressing ?Goal: Diagnostic test results will improve ?Outcome: Progressing ?Goal: Respiratory complications will improve ?Outcome: Progressing ?Goal: Cardiovascular complication will be avoided ?Outcome: Progressing ?  ?

## 2020-12-12 NOTE — Progress Notes (Signed)
Subjective: Patient with great urine output yesterday at 1.6 L; did receive Lasix.  Total net -3 L.  CVP difficult to determine but probably around 8-11.  Patient looks well today and states he is feeling better   Objective Vital signs in last 24 hours: Vitals:   12/12/20 0745 12/12/20 0800 12/12/20 0930 12/12/20 0945  BP: 101/72 92/77 95/72  113/76  Pulse: (!) 25 (!) 57 91 95  Resp: 19 19 19 19   Temp:      TempSrc:      SpO2: 96% 99% 100% 100%  Weight:      Height:       Weight change:   Intake/Output Summary (Last 24 hours) at 12/12/2020 12/14/2020 Last data filed at 12/12/2020 0900 Gross per 24 hour  Intake 1231.74 ml  Output 4394 ml  Net -3162.26 ml    Assessment/ Plan: Pt is a 65 y.o. yo male -  crt 1.11 a year ago, who was admitted on 12/02/2020 with Afib and volume overload.   Initial crt 1.7 but increased in the setting of attempted diuresis and continued hemodyanamic instability  -  CRRT started on 5/12  1. Afib/cardiomyopathy/volume overload-  HF team involved-TEE with EF of 25%, moderately decreased RV function, mitral and tricuspid regurgitation.  Now only on nor epi. Cardioversion completed.  Continues to be volume overloaded with diffuse pitting edema but slowly is improving.  Hopefully we can start transitioning him from CRRT to Lasix. 2. AKI on CKD - baseline somewhere 1.1-1.7. Likely cardiorenal syndrome with ATN related to hemodynamic instability. CRRT started given volume overload.  Continue CRRT for now.  He had a good response to Lasix yesterday.  Repeating Lasix today.  Will decrease UF with CRRT and hopefully start to see some renal recovery 3. Anemia- reasonable-  Systemic heparin but no specific heparin with CRRT. CTM 4. HTN/volume- massive volume overload-UF fairly good but remains volume overloaded with diffuse pitting edema.  Trying to transition from ultrafiltration with CRRT to Lasix. 5. Hyponatremia: Sodium near normal at 134 today 6.  Hypophosphatemia:  Replacement as needed  7/12    Labs: Basic Metabolic Panel: Recent Labs  Lab 12/11/20 0302 12/11/20 1725 12/12/20 0454  NA 136 138 134*  K 4.1 4.7 4.3  CL 104 107 102  CO2 28 28 26   GLUCOSE 92 106* 181*  BUN 22 35* 36*  CREATININE 1.47* 1.75* 1.59*  CALCIUM 8.7* 8.0* 8.0*  PHOS 2.4* 2.5 2.5   Liver Function Tests: Recent Labs  Lab 12/11/20 0302 12/11/20 1725 12/12/20 0454  ALBUMIN 2.6* 2.3* 2.2*   No results for input(s): LIPASE, AMYLASE in the last 168 hours. No results for input(s): AMMONIA in the last 168 hours. CBC: Recent Labs  Lab 12/06/20 0433 12/07/20 0426 12/10/20 0317 12/11/20 0302 12/12/20 0454  WBC 9.8 7.3 11.6* 8.1 7.9  HGB 10.9* 10.4* 10.7* 10.6* 8.4*  HCT 34.4* 32.2* 33.6* 34.9* 26.8*  MCV 73.0* 72.7* 73.5* 75.7* 74.4*  PLT 197 157 157 164 129*   Cardiac Enzymes: No results for input(s): CKTOTAL, CKMB, CKMBINDEX, TROPONINI in the last 168 hours. CBG: Recent Labs  Lab 12/11/20 0737 12/11/20 1109 12/11/20 1557 12/11/20 2129 12/12/20 0631  GLUCAP 99 88 99 93 98    Iron Studies:  No results for input(s): IRON, TIBC, TRANSFERRIN, FERRITIN in the last 72 hours. Studies/Results: DG CHEST PORT 1 VIEW  Result Date: 12/11/2020 CLINICAL DATA:  Central line placement. EXAM: PORTABLE CHEST 1 VIEW COMPARISON:  Radiographs 12/05/2020 and  12/04/2020.  CT 07/23/2015. FINDINGS: Left IJ hemodialysis catheter projects to the confluence of the brachiocephalic veins, similar to previous study. Right arm PICC projects to the level of the superior cavoatrial junction. No pneumothorax. Stable cardiac enlargement and bibasilar atelectasis. The right pleural effusion has resolved with improving aeration of the right lung base. A small left pleural effusion does not appear significantly changed. IMPRESSION: 1. Similar position of the left IJ hemodialysis and right arm PICC lines compared with previous study from 6 days prior. 2. No pneumothorax. 3.  Improved right pleural effusion. Electronically Signed   By: Carey Bullocks M.D.   On: 12/11/2020 14:42   Medications: Infusions: .  prismasol BGK 4/2.5 500 mL/hr at 12/12/20 0807  .  prismasol BGK 4/2.5 300 mL/hr at 12/12/20 0133  . sodium chloride    . sodium chloride    . amiodarone 60 mg/hr (12/12/20 0900)  . furosemide 160 mg (12/12/20 0927)  . heparin 1,350 Units/hr (12/12/20 0900)  . norepinephrine (LEVOPHED) Adult infusion 4 mcg/min (12/12/20 0900)  . prismasol BGK 4/2.5 1,500 mL/hr at 12/12/20 6754    Scheduled Medications: . (feeding supplement) PROSource Plus  30 mL Oral BID BM  . atorvastatin  10 mg Oral Daily  . B-complex with vitamin C  1 tablet Oral Daily  . Chlorhexidine Gluconate Cloth  6 each Topical Daily  . feeding supplement  237 mL Oral BID BM  . insulin aspart  0-5 Units Subcutaneous QHS  . insulin aspart  0-9 Units Subcutaneous TID WC  . loratadine  10 mg Oral Daily  . midodrine  15 mg Oral TID  . montelukast  10 mg Oral QHS  . pantoprazole  40 mg Oral Daily  . senna-docusate  1 tablet Oral BID  . sodium chloride flush  10-40 mL Intracatheter Q12H  . sodium chloride flush  3 mL Intravenous Q12H    have reviewed scheduled and prn medications.  Physical Exam: General: lying in bed no destress Heart: Normal rate Lungs: Bilateral chest rise with no increased work of breathing Abdomen: obese, non tender Extremities: pitting edema throughout, warm and well perfused Dialysis Access: left IJ cath placed 5/12 by CCM    12/12/2020,9:52 AM  LOS: 10 days

## 2020-12-12 NOTE — Plan of Care (Signed)
  Problem: Education: Goal: Ability to verbalize understanding of medication therapies will improve Outcome: Progressing   Problem: Activity: Goal: Capacity to carry out activities will improve Outcome: Progressing   Problem: Cardiac: Goal: Ability to achieve and maintain adequate cardiopulmonary perfusion will improve Outcome: Progressing   Problem: Education: Goal: Knowledge of General Education information will improve Description: Including pain rating scale, medication(s)/side effects and non-pharmacologic comfort measures Outcome: Progressing   Problem: Clinical Measurements: Goal: Diagnostic test results will improve Outcome: Progressing Goal: Respiratory complications will improve Outcome: Progressing Goal: Cardiovascular complication will be avoided Outcome: Progressing   Problem: Nutrition: Goal: Adequate nutrition will be maintained Outcome: Progressing   Problem: Coping: Goal: Level of anxiety will decrease Outcome: Progressing   Problem: Elimination: Goal: Will not experience complications related to bowel motility Outcome: Progressing Goal: Will not experience complications related to urinary retention Outcome: Progressing   Problem: Safety: Goal: Ability to remain free from injury will improve Outcome: Progressing   Problem: Skin Integrity: Goal: Risk for impaired skin integrity will decrease Outcome: Progressing

## 2020-12-12 NOTE — Progress Notes (Signed)
Orthopedic Tech Progress Note Patient Details:  Kevin [name] [birth year] [id number]  Ortho Devices Type of Ortho Device: Roland Rack boot Ortho Device/Splint Location: BLE Ortho Device/Splint Interventions: Ordered,Application   Post Interventions Patient Tolerated: Well Instructions Provided: Care of device,Poper ambulation with device   Kevin [name] 12/12/2020, 10:52 AM

## 2020-12-12 NOTE — Procedures (Signed)
I saw the patient on CRRT. I have reviewed the 24hr data and made appropriate changes. On minimal norepi. Remains overloaded but CVP improved. Will take time to reequilabrate volume. Decrease UF to 50 and use lasix to help achieve net negative state.  Filed Weights   12/08/20 0257 12/09/20 0500 12/12/20 0500  Weight: 127.4 kg 122.3 kg 118.6 kg    Recent Labs  Lab 12/12/20 0454  NA 134*  K 4.3  CL 102  CO2 26  GLUCOSE 181*  BUN 36*  CREATININE 1.59*  CALCIUM 8.0*  PHOS 2.5    Recent Labs  Lab 12/10/20 0317 12/11/20 0302 12/12/20 0454  WBC 11.6* 8.1 7.9  HGB 10.7* 10.6* 8.4*  HCT 33.6* 34.9* 26.8*  MCV 73.5* 75.7* 74.4*  PLT 157 164 129*    Scheduled Meds: . (feeding supplement) PROSource Plus  30 mL Oral BID BM  . atorvastatin  10 mg Oral Daily  . B-complex with vitamin C  1 tablet Oral Daily  . Chlorhexidine Gluconate Cloth  6 each Topical Daily  . feeding supplement  237 mL Oral BID BM  . insulin aspart  0-5 Units Subcutaneous QHS  . insulin aspart  0-9 Units Subcutaneous TID WC  . loratadine  10 mg Oral Daily  . midodrine  15 mg Oral TID  . montelukast  10 mg Oral QHS  . pantoprazole  40 mg Oral Daily  . senna-docusate  1 tablet Oral BID  . sodium chloride flush  10-40 mL Intracatheter Q12H  . sodium chloride flush  3 mL Intravenous Q12H   Continuous Infusions: .  prismasol BGK 4/2.5 500 mL/hr at 12/12/20 0807  .  prismasol BGK 4/2.5 300 mL/hr at 12/12/20 0133  . sodium chloride    . sodium chloride    . amiodarone 60 mg/hr (12/12/20 0957)  . furosemide 160 mg (12/12/20 0927)  . heparin 1,350 Units/hr (12/12/20 0900)  . norepinephrine (LEVOPHED) Adult infusion 4 mcg/min (12/12/20 0900)  . prismasol BGK 4/2.5 1,500 mL/hr at 12/12/20 0737  . sodium phosphate  Dextrose 5% IVPB     PRN Meds:.sodium chloride, Place/Maintain arterial line **AND** sodium chloride, acetaminophen, heparin, ondansetron (ZOFRAN) IV, sodium chloride flush, sodium chloride flush    Louie Bun,  MD 12/12/2020, 9:57 AM

## 2020-12-12 NOTE — Progress Notes (Signed)
ANTICOAGULATION CONSULT NOTE - Follow Up Consult  Pharmacy Consult for heparin Indication: atrial fibrillation  Labs: Recent Labs    12/10/20 0317 12/10/20 1803 12/11/20 0302 12/11/20 0437 12/11/20 1725 12/12/20 0454 12/12/20 0700  HGB 10.7*  --  10.6*  --   --  8.4*  --   HCT 33.6*  --  34.9*  --   --  26.8*  --   PLT 157  --  164  --   --  129*  --   HEPARINUNFRC 0.45  --  >1.10* 0.37  --  >1.10* 0.49  CREATININE 1.73*   < > 1.47*  --  1.75* 1.59*  --    < > = values in this interval not displayed.    Assessment/Plan:  65 yo male on heparin for Afib while holding home Eliquis. Pt s/p DCCV 5/16. Initial heparin level this morning (>1.1) drawn from site of heparin infusion. Upon redraw from trialysis catheter, heparin level therapeutic this morning at 0.49.   Per patient he may have had maroon looking stool overnight. RN to monitor and update team as needed.  CBC slight down this AM: Hgb 8.4, Plt 129  Goal of Therapy:  Heparin level 0.3-0.7 units/ml Monitor platelets by anticoagulation protocol: Yes  Plan: Continue heparin at 1350 units/h Daily heparin level and CBC F/u s/sx bleeding   Calton Dach, PharmD PGY1 Pharmacy Resident 12/12/2020 8:07 AM  Please check AMION for all [facility] Pharm[facility] numbers

## 2020-12-12 NOTE — Progress Notes (Addendum)
Patient ID: Kevin [name], male   DOB: 1956/02/15, 65 y.o.   MRN: [id number]     Advanced Heart Failure Rounding Note  PCP-Cardiologist: Chrystie Nose, MD   Subjective:    - CVVH started 5/12 with intractable volume overload.   - TEE/DCCV>>NSR 5/16  Appears to be in AFL this morning. V-rates 90s-low 100s   Continues w/ CVVH. 2.6L pulled off yesterday + 1.6L in UOP after dose of IV Lasix. Overall net negative 3.2L yesterday. Wt continues to trend down (another 8 lbs).  CVP 8-9.    Scr down 1.75>>1.59  K 4.3   Hgb down 10.6>>8.4. Reports maroon colored stool last PM x 1.   Off milrinone but still requiring NE for BP support, currently on 5 mc (titrated up to 12 overnight). Co-ox marginal 50%  Sitting up in bed. Feels ok.   TEE: Moderate LV dilation with EF 25%, moderately decreased RV function, moderate functional MR, mod-severe TR.    Objective:   Weight Range: 118.6 kg Body mass index is 34.5 kg/m.   Vital Signs:   Temp:  [97.5 F (36.4 C)-98.7 F (37.1 C)] 98.3 F (36.8 C) (05/19 0717) Pulse Rate:  [28-125] 31 (05/19 0715) Resp:  [3-24] 12 (05/19 0715) BP: (76-132)/(43-89) 132/76 (05/19 0715) SpO2:  [89 %-100 %] 100 % (05/19 0715) Weight:  [118.6 kg] 118.6 kg (05/19 0500) Last BM Date: 12/11/20  Weight change: Filed Weights   12/08/20 0257 12/09/20 0500 12/12/20 0500  Weight: 127.4 kg 122.3 kg 118.6 kg    Intake/Output:   Intake/Output Summary (Last 24 hours) at 12/12/2020 0756 Last data filed at 12/12/2020 0715 Gross per 24 hour  Intake 1284.13 ml  Output 4281 ml  Net -2996.87 ml      Physical Exam   CVP 12 General:  Well appearing, sitting up in bed. No respiratory difficulty HEENT: normal Neck: supple. no JVD elevated to jaw Carotids 2+ bilat; no bruits. No lymphadenopathy or thyromegaly appreciated. Cor: PMI nondisplaced. Irregularly irregular rhythm, mildly tachy. No rubs, gallops or murmurs. Lungs: clear bilaterally  Abdomen: soft, nontender,  nondistended. No hepatosplenomegaly. No bruits or masses. Good bowel sounds. Extremities: no cyanosis, clubbing, rash, 2+ LE edema up to thighs  Neuro: alert & oriented x 3, cranial nerves grossly intact. moves all 4 extremities w/o difficulty. Affect pleasant.   Telemetry   AFL 90s-low 100s  Personally reviewed  Labs    CBC Recent Labs    12/11/20 0302 12/12/20 0454  WBC 8.1 7.9  HGB 10.6* 8.4*  HCT 34.9* 26.8*  MCV 75.7* 74.4*  PLT 164 129*   Basic Metabolic Panel Recent Labs    40/98/11 0302 12/11/20 1725 12/12/20 0454  NA 136 138 134*  K 4.1 4.7 4.3  CL 104 107 102  CO2 28 28 26   GLUCOSE 92 106* 181*  BUN 22 35* 36*  CREATININE 1.47* 1.75* 1.59*  CALCIUM 8.7* 8.0* 8.0*  MG 2.4  --  2.3  PHOS 2.4* 2.5 2.5   Liver Function Tests Recent Labs    12/11/20 1725 12/12/20 0454  ALBUMIN 2.3* 2.2*   No results for input(s): LIPASE, AMYLASE in the last 72 hours. Cardiac Enzymes No results for input(s): CKTOTAL, CKMB, CKMBINDEX, TROPONINI in the last 72 hours.  BNP: BNP (last 3 results) Recent Labs    12/02/20 0915  BNP 1,158.5*    ProBNP (last 3 results) No results for input(s): PROBNP in the last 8760 hours.   D-Dimer No results for input(s): DDIMER  in the last 72 hours. Hemoglobin A1C No results for input(s): HGBA1C in the last 72 hours. Fasting Lipid Panel No results for input(s): CHOL, HDL, LDLCALC, TRIG, CHOLHDL, LDLDIRECT in the last 72 hours. Thyroid Function Tests No results for input(s): TSH, T4TOTAL, T3FREE, THYROIDAB in the last 72 hours.  Invalid input(s): FREET3  Other results:   Imaging    DG CHEST PORT 1 VIEW  Result Date: 12/11/2020 CLINICAL DATA:  Central line placement. EXAM: PORTABLE CHEST 1 VIEW COMPARISON:  Radiographs 12/05/2020 and 12/04/2020.  CT 07/23/2015. FINDINGS: Left IJ hemodialysis catheter projects to the confluence of the brachiocephalic veins, similar to previous study. Right arm PICC projects to the level of  the superior cavoatrial junction. No pneumothorax. Stable cardiac enlargement and bibasilar atelectasis. The right pleural effusion has resolved with improving aeration of the right lung base. A small left pleural effusion does not appear significantly changed. IMPRESSION: 1. Similar position of the left IJ hemodialysis and right arm PICC lines compared with previous study from 6 days prior. 2. No pneumothorax. 3. Improved right pleural effusion. Electronically Signed   By: Carey Bullocks M.D.   On: 12/11/2020 14:42     Medications:     Scheduled Medications: . (feeding supplement) PROSource Plus  30 mL Oral BID BM  . atorvastatin  10 mg Oral Daily  . B-complex with vitamin C  1 tablet Oral Daily  . Chlorhexidine Gluconate Cloth  6 each Topical Daily  . feeding supplement  237 mL Oral BID BM  . insulin aspart  0-5 Units Subcutaneous QHS  . insulin aspart  0-9 Units Subcutaneous TID WC  . loratadine  10 mg Oral Daily  . midodrine  12.5 mg Oral TID WC  . montelukast  10 mg Oral QHS  . pantoprazole  40 mg Oral Daily  . senna-docusate  1 tablet Oral BID  . sodium chloride flush  10-40 mL Intracatheter Q12H  . sodium chloride flush  3 mL Intravenous Q12H    Infusions: .  prismasol BGK 4/2.5 500 mL/hr at 12/12/20 0507  .  prismasol BGK 4/2.5 300 mL/hr at 12/12/20 0133  . sodium chloride    . sodium chloride    . amiodarone 30 mg/hr (12/12/20 0700)  . heparin 1,350 Units/hr (12/12/20 0700)  . norepinephrine (LEVOPHED) Adult infusion 5 mcg/min (12/12/20 0700)  . prismasol BGK 4/2.5 1,500 mL/hr at 12/11/20 2157    PRN Medications: sodium chloride, Place/Maintain arterial line **AND** sodium chloride, acetaminophen, heparin, ondansetron (ZOFRAN) IV, sodium chloride flush, sodium chloride flush    Assessment/Plan   1. Acute on chronic systolic CHF: Nonischemic cardiomyopathy.  Echo this admission with EF 25-30%, moderate LV dilation, mild LVH, moderate RVE with moderately decreased RV  systolic function, severe biatrial enlargement, moderate-severe TR, IVC dilated. EF was low starting in 2016, cath at that time showed mild nonobstructive CAD.  Patient had runs of AF but not sustained when initial diagnosis was made.  Was readmitted in 2018 with persistent AF and CHF, echo with EF 20-25%.  Had DCCV back to NSR at that time.  EF up to 55-60% in 2019.  Lost to followup, now back with progressive dyspnea/volume overload and EF back down in setting of AF with RVR.  Difficult to identify initial etiology of CMP but cannot rule out tachy-mediated.  UDS negative for cocaine. 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  He does not feel AF and we are not sure how long he has been in it.  He is 30+ lbs up with marked volume overload and concern for low output HF (progressive rise in creatinine with attempts at diuresis).  Poor response to initiation of milrinone 0.25 then norepinephrine, creatinine continued to rise and CVVH started 5/12. Now off milrinone. On NE for BP support, co-ox marginal at 50 %. Still with peripheral edema but CVP down to 8-9. Appears to be back in AFL post cardioversion.   - Continue CVVH. Defer to nephrology  - Repeat IV Lasix again today  - Will need eventual RHC, consider repeat coronary angiography if creatinine improves significantly => HS-TnI slightly elevated with no trend so no ACS.  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   - S/p DCCV 5/16 but appears to be back in AFL on tele today. Increase amio gtt back to 60/hr - Consider eventual cardiac MRI.  - Unna boots.  - increase Midodrine to 15 tid  2. Atrial fibrillation: Admitted with afib/RVR, uncertain how long this has been present.  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  - S/p TEE/DCCV 5/16. Back in AFL today   - Increase amiodarone gtt  60 mg/hr.  - Continue on heparin gtt, eventually transition to Eliquis. Monitor for bleeding  3. AKI: Uncertain baseline, creatinine 1.77  at arrival, creatinine up to 3.49 with poor diuresis and CVVH started with intractable volume overload. Scr improving, 1.59 today  - Continue UF per Renal 4. Hematuria: Seen by urology => think old blood in urine, renal ultrasound ok.  Recommend continuing heparin gtt.  Has foley. No further gross hematuria  5. Constipation - resolved 6. Iron-deficieny anemia  - received feraheme 5/12 and 5/15 - hgb down 10.6>>8.4. Had maroon colored stool x 1. Monitor for recurrence and follow CBC   Length of Stay: [address] PA-C  12/12/2020, 7:56 AM  Advanced Heart Failure Team Pager [phone] (M-F; 7a - 5p)  Please contact [facility] for night-coverage after hours (5p -7a ) and weekends on [email or web address]  Patient seen with PA, agree with the above note.   CVVH running net UF 150 cc/hr. 1645 cc UOP yesterday after Lasix 160 mg IV x 1. CVP 8-9 on my read today. Co-ox 50% in early am on NE 4 currently. He went back into atrial flutter this morning. On heparin gtt + amiodarone 30.   Weight down 8 lbs and oxygen down to 3L.   General: NAD Neck: JVP 8-9 cm, no thyromegaly or thyroid nodule.  Lungs: Decreased at bases.  CV: Nondisplaced PMI.  Heart irregular S1/S2, no S3/S4, 2/6 HSM LLSB/apex.  1+ edema to knees. Abdomen: Soft, nontender, no hepatosplenomegaly, no distention.  Skin: Intact without lesions or rashes.  Neurologic: Alert and oriented x 3.  Psych: Normal affect. Extremities: No clubbing or cyanosis.  HEENT: Normal.   Patient went back into atrial fibrillation, now on amiodarone gtt at 30 + heparin gtt. Important to maintain NSR given concern for tachy-mediated CMP as main cause of HF. Eventually needs coronary angiography but will either need to recover renal function or be committed to long-term HD prior to this. Eventual cardiac MRI.  - Increase amiodarone gtt to 60 mg/hr.   - Will try to wean off NE again.   - If stays in atrial fibrillation tomorrow, will repeat  DCCV (does not need TEE).   Co-ox low at 50% but back in AF. Has been on low dose NE to facilitate CVVH. He still has significant peripheral edema and but oxygen requirement down and CVP  down to 8-9. Good response to Lasix bolus yesterday.  - Decrease CVVH rate to net UF 50 cc/hr today as volume status improving.  - Will give Lasix 160 mg IV x 1 again.  - Will try to wean off NE today, this will help [place] keep him in NSR.   CRITICAL CARE Performed by: Marca Ancona  Total critical care time: 35 minutes  Critical care time was exclusive of separately billable procedures and treating other patients.  Critical care was necessary to treat or prevent imminent or life-threatening deterioration.  Critical care was time spent personally by me on the following activities: development of treatment plan with patient and/or surrogate as well as nursing, discussions with consultants, evaluation of patient's response to treatment, examination of patient, obtaining history from patient or surrogate, ordering and performing treatments and interventions, ordering and review of laboratory studies, ordering and review of radiographic studies, pulse oximetry and re-evaluation of patient's condition.   Marca Ancona 12/12/2020 8:28 AM

## 2020-12-13 ENCOUNTER — Encounter (HOSPITAL_COMMUNITY)
Admission: EM | Disposition: A | Discharge status: Home / Self Care | Payer: Self-pay | Source: Home / Self Care | Attending: Cardiology

## 2020-12-13 DIAGNOSIS — I5043 Acute on chronic combined systolic (congestive) and diastolic (congestive) heart failure: Secondary | ICD-10-CM | POA: Diagnosis not present

## 2020-12-13 LAB — MAGNESIUM: Magnesium: 2.2 mg/dL (ref 1.7–2.4)

## 2020-12-13 LAB — GLUCOSE, CAPILLARY
Glucose-Capillary: 96 mg/dL (ref 70–99)
Glucose-Capillary: 99 mg/dL (ref 70–99)
Glucose-Capillary: 108 mg/dL — ABNORMAL HIGH (ref 70–99)
Glucose-Capillary: 118 mg/dL — ABNORMAL HIGH (ref 70–99)

## 2020-12-13 LAB — RENAL FUNCTION PANEL
Albumin: 2.4 g/dL — ABNORMAL LOW (ref 3.5–5.0)
Albumin: 2.6 g/dL — ABNORMAL LOW (ref 3.5–5.0)
Anion gap: 5 (ref 5–15)
Anion gap: 8 (ref 5–15)
BUN: 28 mg/dL — ABNORMAL HIGH (ref 8–23)
BUN: 25 mg/dL — ABNORMAL HIGH (ref 8–23)
CO2: 28 mmol/L (ref 22–32)
CO2: 27 mmol/L (ref 22–32)
Calcium: 8.3 mg/dL — ABNORMAL LOW (ref 8.9–10.3)
Calcium: 8.2 mg/dL — ABNORMAL LOW (ref 8.9–10.3)
Chloride: 103 mmol/L (ref 98–111)
Chloride: 99 mmol/L (ref 98–111)
Creatinine, Ser: 1.45 mg/dL — ABNORMAL HIGH (ref 0.61–1.24)
Creatinine, Ser: 1.67 mg/dL — ABNORMAL HIGH (ref 0.61–1.24)
GFR, Estimated: 53 mL/min — ABNORMAL LOW
GFR, Estimated: 45 mL/min — ABNORMAL LOW
Glucose, Bld: 93 mg/dL (ref 70–99)
Glucose, Bld: 183 mg/dL — ABNORMAL HIGH (ref 70–99)
Phosphorus: 2.6 mg/dL (ref 2.5–4.6)
Phosphorus: 1.8 mg/dL — ABNORMAL LOW (ref 2.5–4.6)
Potassium: 4 mmol/L (ref 3.5–5.1)
Potassium: 3.9 mmol/L (ref 3.5–5.1)
Sodium: 136 mmol/L (ref 135–145)
Sodium: 134 mmol/L — ABNORMAL LOW (ref 135–145)

## 2020-12-13 LAB — CBC
HCT: 26.8 % — ABNORMAL LOW (ref 39.0–52.0)
Hemoglobin: 8.5 g/dL — ABNORMAL LOW (ref 13.0–17.0)
MCH: 23.4 pg — ABNORMAL LOW (ref 26.0–34.0)
MCHC: 31.7 g/dL (ref 30.0–36.0)
MCV: 73.8 fL — ABNORMAL LOW (ref 80.0–100.0)
Platelets: 143 10*3/uL — ABNORMAL LOW (ref 150–400)
RBC: 3.63 MIL/uL — ABNORMAL LOW (ref 4.22–5.81)
RDW: 18.9 % — ABNORMAL HIGH (ref 11.5–15.5)
WBC: 8.5 10*3/uL (ref 4.0–10.5)
nRBC: 0 % (ref 0.0–0.2)

## 2020-12-13 LAB — HEPARIN LEVEL (UNFRACTIONATED)
Heparin Unfractionated: >1.1 [IU]/mL — ABNORMAL HIGH (ref 0.30–0.70)
Heparin Unfractionated: 0.67 [IU]/mL (ref 0.30–0.70)

## 2020-12-13 LAB — COOXEMETRY PANEL
Carboxyhemoglobin: 1 % (ref 0.5–1.5)
Methemoglobin: 1.2 % (ref 0.0–1.5)
O2 Saturation: 59.9 %
Total hemoglobin: 8.5 g/dL — ABNORMAL LOW (ref 12.0–16.0)

## 2020-12-13 SURGERY — CARDIOVERSION
Anesthesia: General

## 2020-12-13 MED ORDER — FUROSEMIDE 10 MG/ML IJ SOLN
120.0000 mg | Freq: Two times a day (BID) | INTRAVENOUS | Status: AC
  Administered 2020-12-13 (×2): 120 mg via INTRAVENOUS
  Filled 2020-12-13 (×2): qty 12

## 2020-12-13 MED ORDER — SODIUM PHOSPHATES 45 MMOLE/15ML IV SOLN
10.0000 mmol | Freq: Once | INTRAVENOUS | Status: AC
  Administered 2020-12-13: 10 mmol via INTRAVENOUS
  Filled 2020-12-13: qty 3.33

## 2020-12-13 MED ORDER — FUROSEMIDE 10 MG/ML IJ SOLN
120.0000 mg | Freq: Three times a day (TID) | INTRAVENOUS | Status: DC
  Filled 2020-12-13 (×3): qty 12

## 2020-12-13 MED ORDER — FUROSEMIDE 10 MG/ML IJ SOLN
120.0000 mg | Freq: Two times a day (BID) | INTRAVENOUS | Status: DC
  Administered 2020-12-14: 120 mg via INTRAVENOUS
  Filled 2020-12-13 (×2): qty 12

## 2020-12-13 NOTE — Progress Notes (Signed)
ANTICOAGULATION CONSULT NOTE - Follow Up Consult  Pharmacy Consult for heparin Indication: atrial fibrillation  Labs: Recent Labs    12/12/20 0454 12/12/20 0700 12/12/20 1316 12/12/20 1509 12/13/20 0433 12/13/20 0623  HGB 8.4*  --  9.1*  --  8.5*  --   HCT 26.8*  --  29.1*  --  26.8*  --   PLT 129*  --  137*  --  143*  --   HEPARINUNFRC >1.10* 0.49  --   --  >1.10* 0.67  CREATININE 1.59*  --   --  1.65* 1.45*  --     Assessment/Plan:  65 y/o male on heparin for Afib while holding home Eliquis. Pt s/p DCCV 5/16. Patient continues to have intermittently irregular rhythm.  Initial heparin level this morning (>1.1) drawn from site of heparin infusion. Upon redraw from peripheral site, heparin level therapeutic this morning at 0.67.   Patient did have maroon stool x2 episodes.  CBC low stable this AM: Hgb 8.5, Plt 143  Goal of Therapy:  Heparin level 0.3-0.7 units/ml Monitor platelets by anticoagulation protocol: Yes  Plan: Continue heparin at 1350 units/h Daily heparin level and CBC F/u s/sx bleeding   Calton Dach, PharmD PGY1 Pharmacy Resident 12/13/2020 7:00 AM  Please check AMION for all [facility] numbers

## 2020-12-13 NOTE — Plan of Care (Signed)
?  Problem: Education: ?Goal: Ability to demonstrate management of disease process will improve ?Outcome: Progressing ?Goal: Ability to verbalize understanding of medication therapies will improve ?Outcome: Progressing ?Goal: Individualized Educational Video(s) ?Outcome: Progressing ?  ?Problem: Activity: ?Goal: Capacity to carry out activities will improve ?Outcome: Progressing ?  ?Problem: Cardiac: ?Goal: Ability to achieve and maintain adequate cardiopulmonary perfusion will improve ?Outcome: Progressing ?  ?Problem: Education: ?Goal: Knowledge of General Education information will improve ?Description: Including pain rating scale, medication(s)/side effects and non-pharmacologic comfort measures ?Outcome: Progressing ?  ?Problem: Health Behavior/Discharge Planning: ?Goal: Ability to manage health-related needs will improve ?Outcome: Progressing ?  ?Problem: Clinical Measurements: ?Goal: Ability to maintain clinical measurements within normal limits will improve ?Outcome: Progressing ?Goal: Will remain free from infection ?Outcome: Progressing ?Goal: Diagnostic test results will improve ?Outcome: Progressing ?Goal: Respiratory complications will improve ?Outcome: Progressing ?Goal: Cardiovascular complication will be avoided ?Outcome: Progressing ?  ?

## 2020-12-13 NOTE — Evaluation (Signed)
Physical Therapy Evaluation Patient Details Name: Kevin [name] MRN: [id number] DOB: 04/23/1956 Today's Date: 12/13/2020   History of Present Illness  65 y.o. admitted 5/9 with Afib and volume overload with acute on chronic CHF. CRRT started 5/12. 5/16 TEE with DCCV. PMhx: combined CHF, NICM, non-obstructive CAD, paroxysmal atrial fibrillation, moderate mitral regurgitation, HTN, DM type 2, COPD, tobacco abuse  Clinical Impression  Pt very pleasant and eager to move. Pt without significant assist for mobility or gait with RW and management of lines. Anticipate pt will progress quickly with all activity and be able to return home at D/C. Pt with decreased activity tolerance and gait limited by CRRT and will benefit from acute therapy to maximize independence and mobility to return to PLOF.   HR in sinus rhythm throughout 75-83 SpO2 95% on RA   Follow Up Recommendations Home health PT    Equipment Recommendations  Other (comment) (TBD with progression)    Recommendations for Other Services       Precautions / Restrictions Precautions Precautions: Other (comment) Precaution Comments: CRRT Restrictions Weight Bearing Restrictions: No      Mobility  Bed Mobility Overal bed mobility: Needs Assistance Bed Mobility: Supine to Sit     Supine to sit: HOB elevated;Min guard     General bed mobility comments: HOB 25 degrees with use of rail to pivot to left side of bed and rise from surface    Transfers Overall transfer level: Needs assistance   Transfers: Sit to/from Stand Sit to Stand: Min guard;+2 safety/equipment         General transfer comment: cues for hand placement and safety with +2 for management of cRRT  Ambulation/Gait Ambulation/Gait assistance: Min guard;+2 safety/equipment Gait Distance (Feet): 28 Feet Assistive device: Rolling walker (2 wheeled) Gait Pattern/deviations: Step-through pattern;Decreased stride length   Gait velocity interpretation: 1.31 -  2.62 ft/sec, indicative of limited community ambulator General Gait Details: pt walking forward and backward within range of CRRT lines with RW with cues for safety and assist for lines  Stairs            Wheelchair Mobility    Modified Rankin (Stroke Patients Only)       Balance Overall balance assessment: Mild deficits observed, not formally tested                                           Pertinent Vitals/Pain Pain Assessment: No/denies pain    Home Living Family/patient expects to be discharged to:: Group home Living Arrangements: Group Home Available Help at Discharge: Friend(s);Available PRN/intermittently Type of Home: House Home Access: Stairs to enter   Entrance Stairs-Number of Steps: 3 Home Layout: One level;Laundry or work area in [employer]: None Additional Comments: pt is living in a recovery home and reports other members can assist    Prior Function Level of Independence: Independent               Hand Dominance        Extremity/Trunk Assessment   Upper Extremity Assessment Upper Extremity Assessment: Overall WFL for tasks assessed    Lower Extremity Assessment Lower Extremity Assessment: Overall WFL for tasks assessed    Cervical / Trunk Assessment Cervical / Trunk Assessment: Normal  Communication   Communication: No difficulties  Cognition Arousal/Alertness: Awake/alert Behavior During Therapy: WFL for tasks assessed/performed Overall Cognitive Status: Within Functional Limits for tasks  assessed                                        General Comments      Exercises General Exercises - Lower Extremity Long Arc Quad: AROM;Both;10 reps;Seated Hip Flexion/Marching: AROM;Both;Seated;10 reps   Assessment/Plan    PT Assessment Patient needs continued PT services  PT Problem List Decreased mobility;Decreased activity tolerance;Decreased balance;Decreased knowledge of use of  DME;Cardiopulmonary status limiting activity       PT Treatment Interventions Gait training;Functional mobility training;Stair training;Therapeutic exercise;Therapeutic activities;DME instruction;Patient/family education    PT Goals (Current goals can be found in the Care Plan section)  Acute Rehab PT Goals Patient Stated Goal: return home PT Goal Formulation: With patient Time For Goal Achievement: 12/27/20 Potential to Achieve Goals: Good    Frequency Min 3X/week   Barriers to discharge Decreased caregiver support      Co-evaluation               AM-PAC PT "6 Clicks" Mobility  Outcome Measure Help needed turning from your back to your side while in a flat bed without using bedrails?: A Little Help needed moving from lying on your back to sitting on the side of a flat bed without using bedrails?: A Little Help needed moving to and from a bed to a chair (including a wheelchair)?: A Little Help needed standing up from a chair using your arms (e.g., wheelchair or bedside chair)?: A Little Help needed to walk in hospital room?: A Little Help needed climbing 3-5 steps with a railing? : A Little 6 Click Score: 18    End of Session Equipment Utilized During Treatment: Gait belt Activity Tolerance: Patient tolerated treatment well Patient left: in chair;with call bell/phone within reach;with nursing/sitter in room;with chair alarm set Nurse Communication: Mobility status PT Visit Diagnosis: Other abnormalities of gait and mobility (R26.89)    Time: 5366-4403 PT Time Calculation (min) (ACUTE ONLY): 22 min   Charges:   PT Evaluation $PT Eval High Complexity: 1 High          Pheobe Sandiford P, PT Acute Rehabilitation Services Pager: [phone] Office: [phone]   Enedina Finner Nyelli Samara 12/13/2020, 11:31 AM

## 2020-12-13 NOTE — Progress Notes (Addendum)
Patient ID: Kevin [name], male   DOB: 1956-06-28, 65 y.o.   MRN: [id number]     Advanced Heart Failure Rounding Note  PCP-Cardiologist: Chrystie Nose, MD   Subjective:    - CVVH started 5/12 with intractable volume overload.   - TEE/DCCV>>NSR 5/16   Yesterday amio drip increased to 60 mg per hour. Back in NSR today.   Continues w/ CVVH. ~ 2 liters off. ~ 3 liters of urine.     CVP 8-9    Scr down 1.75>>1.59 >>1.45  K 4  Hgb down 10.6>>8.4>>8.5 . Had maroon stool this morning.   CO-OX 60%. Off Norepi    Feels ok.   TEE: Moderate LV dilation with EF 25%, moderately decreased RV function, moderate functional MR, mod-severe TR.    Objective:   Weight Range: 112.4 kg Body mass index is 32.69 kg/m.   Vital Signs:   Temp:  [97.5 F (36.4 C)-99.3 F (37.4 C)] 97.6 F (36.4 C) (05/20 0600) Pulse Rate:  [25-181] 62 (05/20 0709) Resp:  [11-23] 13 (05/20 0709) BP: (75-134)/(33-96) 102/84 (05/20 0709) SpO2:  [92 %-100 %] 100 % (05/20 0709) Weight:  [112.4 kg] 112.4 kg (05/20 0500) Last BM Date: 12/12/20  Weight change: Filed Weights   12/09/20 0500 12/12/20 0500 12/13/20 0500  Weight: 122.3 kg 118.6 kg 112.4 kg    Intake/Output:   Intake/Output Summary (Last 24 hours) at 12/13/2020 0713 Last data filed at 12/13/2020 0700 Gross per 24 hour  Intake 1869.95 ml  Output 4619 ml  Net -2749.05 ml      Physical Exam  CVP 8 General: . No resp difficulty HEENT: normal Neck: supple. JVP 7-8. Carotids 2+ bilat; no bruits. No lymphadenopathy or thryomegaly appreciated. LIJ HD Cor: PMI nondisplaced. Regular rate & rhythm. No rubs, gallops or murmurs. Lungs: clear Abdomen: soft, nontender, nondistended. No hepatosplenomegaly. No bruits or masses. Good bowel sounds. Extremities: no cyanosis, clubbing, rash, R and LLE 2+ with unna boots. RUE PICC  Neuro: alert & orientedx3, cranial nerves grossly intact. moves all 4 extremities w/o difficulty. Affect pleasant   Telemetry    NSr 60s with PACs  Labs    CBC Recent Labs    12/12/20 1316 12/13/20 0433  WBC 8.7 8.5  HGB 9.1* 8.5*  HCT 29.1* 26.8*  MCV 74.0* 73.8*  PLT 137* 143*   Basic Metabolic Panel Recent Labs    08/65/78 0454 12/12/20 1509 12/13/20 0433  NA 134* 136 136  K 4.3 4.1 4.0  CL 102 102 103  CO2 26 27 28   GLUCOSE 181* 101* 93  BUN 36* 36* 28*  CREATININE 1.59* 1.65* 1.45*  CALCIUM 8.0* 8.3* 8.3*  MG 2.3  --  2.2  PHOS 2.5 3.3 2.6   Liver Function Tests Recent Labs    12/12/20 1509 12/13/20 0433  ALBUMIN 2.6* 2.4*   No results for input(s): LIPASE, AMYLASE in the last 72 hours. Cardiac Enzymes No results for input(s): CKTOTAL, CKMB, CKMBINDEX, TROPONINI in the last 72 hours.  BNP: BNP (last 3 results) Recent Labs    12/02/20 0915  BNP 1,158.5*    ProBNP (last 3 results) No results for input(s): PROBNP in the last 8760 hours.   D-Dimer No results for input(s): DDIMER in the last 72 hours. Hemoglobin A1C No results for input(s): HGBA1C in the last 72 hours. Fasting Lipid Panel No results for input(s): CHOL, HDL, LDLCALC, TRIG, CHOLHDL, LDLDIRECT in the last 72 hours. Thyroid Function Tests No results for input(s): TSH,  T4TOTAL, T3FREE, THYROIDAB in the last 72 hours.  Invalid input(s): FREET3  Other results:   Imaging    No results found.   Medications:     Scheduled Medications: . (feeding supplement) PROSource Plus  30 mL Oral BID BM  . atorvastatin  10 mg Oral Daily  . B-complex with vitamin C  1 tablet Oral Daily  . Chlorhexidine Gluconate Cloth  6 each Topical Daily  . feeding supplement  237 mL Oral BID BM  . insulin aspart  0-5 Units Subcutaneous QHS  . insulin aspart  0-9 Units Subcutaneous TID WC  . loratadine  10 mg Oral Daily  . midodrine  15 mg Oral TID  . montelukast  10 mg Oral QHS  . pantoprazole  40 mg Oral Daily  . senna-docusate  1 tablet Oral BID  . sodium chloride flush  10-40 mL Intracatheter Q12H  . sodium chloride  flush  3 mL Intravenous Q12H    Infusions: .  prismasol BGK 4/2.5 500 mL/hr at 12/13/20 0512  .  prismasol BGK 4/2.5 300 mL/hr at 12/12/20 1509  . sodium chloride    . sodium chloride    . amiodarone 60 mg/hr (12/13/20 0700)  . furosemide    . heparin 1,350 Units/hr (12/13/20 0700)  . norepinephrine (LEVOPHED) Adult infusion Stopped (12/12/20 1148)  . prismasol BGK 4/2.5 1,500 mL/hr at 12/13/20 0512    PRN Medications: sodium chloride, Place/Maintain arterial line **AND** sodium chloride, acetaminophen, heparin, ondansetron (ZOFRAN) IV, sodium chloride flush, sodium chloride flush    Assessment/Plan   1. Acute on chronic systolic CHF: Nonischemic cardiomyopathy.  Echo this admission with EF 25-30%, moderate LV dilation, mild LVH, moderate RVE with moderately decreased RV systolic function, severe biatrial enlargement, moderate-severe TR, IVC dilated. EF was low starting in 2016, cath at that time showed mild nonobstructive CAD.  Patient had runs of AF but not sustained when initial diagnosis was made.  Was readmitted in 2018 with persistent AF and CHF, echo with EF 20-25%.  Had DCCV back to NSR at that time.  EF up to 55-60% in 2019.  Lost to followup, now back with progressive dyspnea/volume overload and EF back down in setting of AF with RVR.  Difficult to identify initial etiology of CMP but cannot rule out tachy-mediated.  UDS negative for cocaine. 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  He does not feel AF and we are not sure how long he has been in it.  He was 30+ lbs up with marked volume overload and concern for low output HF (progressive rise in creatinine with attempts at diuresis).  Poor response to initiation of milrinone 0.25 then norepinephrine, creatinine continued to rise and CVVH started 5/12. Now off milrinone. Off NE, CO-OX 60%.  CVP 8, good UOP.  - Continue CVVH. Defer to nephrology . Plan to stop later today.  - IV lasix per nephrology.  - Will  need eventual RHC, consider repeat coronary angiography if creatinine improves significantly => HS-TnI slightly elevated with no trend so no ACS.  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   - S/p DCCV 5/16 on 5/19 he was back in A flutter. Today he is back in NSR.  - Consider eventual cardiac MRI.  - Unna boots.  - Continue midodrine to 15 tid  2. Atrial fibrillation: Admitted with afib/RVR, uncertain how long this has been present.  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  - S/p TEE/DCCV 5/16. Back in NSR with increased amiodarone.  -  Cut back amio to 30 mg per hour.  - Cancel DC-CV.  - On heparin drip.  3. AKI: Uncertain baseline, creatinine 1.77 at arrival, creatinine up to 3.49 with poor diuresis and CVVH started with intractable volume overload. Scr improving, 1.59 today  - Continue UF per Renal 4. Hematuria: Seen by urology => think old blood in urine, renal ultrasound ok.  Recommend continuing heparin gtt.  Has foley. No further gross hematuria  5. Constipation - resolved 6. Iron-deficieny anemia  - received feraheme 5/12 and 5/15 - hgb down 10.6>>8.4>>8.5 .  7. Deconditioning Consult PT. OOB.    Length of Stay: 11  Amy Clegg, NP  12/13/2020, 7:13 AM  Advanced Heart Failure Team Pager [phone] (M-F; 7a - 5p)  Please contact [facility] for night-coverage after hours (5p -7a ) and weekends on [email or web address]  Patient seen with NP, agree with the above note.   Good UOP yesterday (2735 cc), weight down.  Co-ox 60% with CVP 8.  He is back in NSR today on amiodarone gtt, off norepinephrine.  No complaints.  CVVH currently pulling net 50 cc/hr.   General: NAD Neck: JVP 8 cm, no thyromegaly or thyroid nodule.  Lungs: Clear to auscultation bilaterally with normal respiratory effort. CV: Nondisplaced PMI.  Heart regular S1/S2, no S3/S4, no murmur.  1+ ankle edema. Abdomen: Soft, nontender, no hepatosplenomegaly, no distention.  Skin: Intact without lesions or rashes.   Neurologic: Alert and oriented x 3.  Psych: Normal affect. Extremities: No clubbing or cyanosis.  HEENT: Normal.   Patient is back in NSR today, decrease amiodarone to 30 mg/hr.  Will continue heparin gtt until we are more sure he will not need long-term HD.   Think we can stop CVVH today, will give Lasix 120 mg IV bid today and follow response.   PT/OT evaluation after CVVH off.   CRITICAL CARE Performed by: Marca Ancona  Total critical care time: 35 minutes  Critical care time was exclusive of separately billable procedures and treating other patients.  Critical care was necessary to treat or prevent imminent or life-threatening deterioration.  Critical care was time spent personally by me on the following activities: development of treatment plan with patient and/or surrogate as well as nursing, discussions with consultants, evaluation of patient's response to treatment, examination of patient, obtaining history from patient or surrogate, ordering and performing treatments and interventions, ordering and review of laboratory studies, ordering and review of radiographic studies, pulse oximetry and re-evaluation of patient's condition.  Marca Ancona 12/13/2020 7:41 AM

## 2020-12-13 NOTE — Procedures (Signed)
I saw the patient on CRRT. I have reviewed the 24hr data and made appropriate changes.  No longer requiring pressors.  No ultrafiltration with CRRT.  No restart tonight.  Filed Weights   12/09/20 0500 12/12/20 0500 12/13/20 0500  Weight: 122.3 kg 118.6 kg 112.4 kg    Recent Labs  Lab 12/13/20 0433  NA 136  K 4.0  CL 103  CO2 28  GLUCOSE 93  BUN 28*  CREATININE 1.45*  CALCIUM 8.3*  PHOS 2.6    Recent Labs  Lab 12/12/20 0454 12/12/20 1316 12/13/20 0433  WBC 7.9 8.7 8.5  HGB 8.4* 9.1* 8.5*  HCT 26.8* 29.1* 26.8*  MCV 74.4* 74.0* 73.8*  PLT 129* 137* 143*    Scheduled Meds: . (feeding supplement) PROSource Plus  30 mL Oral BID BM  . atorvastatin  10 mg Oral Daily  . B-complex with vitamin C  1 tablet Oral Daily  . Chlorhexidine Gluconate Cloth  6 each Topical Daily  . feeding supplement  237 mL Oral BID BM  . insulin aspart  0-5 Units Subcutaneous QHS  . insulin aspart  0-9 Units Subcutaneous TID WC  . loratadine  10 mg Oral Daily  . midodrine  15 mg Oral TID  . montelukast  10 mg Oral QHS  . pantoprazole  40 mg Oral Daily  . senna-docusate  1 tablet Oral BID  . sodium chloride flush  10-40 mL Intracatheter Q12H  . sodium chloride flush  3 mL Intravenous Q12H   Continuous Infusions: .  prismasol BGK 4/2.5 500 mL/hr at 12/13/20 0512  .  prismasol BGK 4/2.5 300 mL/hr at 12/13/20 0848  . sodium chloride    . sodium chloride    . amiodarone 30 mg/hr (12/13/20 1000)  . furosemide Stopped (12/13/20 0900)  . heparin 1,350 Units/hr (12/13/20 1000)  . prismasol BGK 4/2.5 1,500 mL/hr at 12/13/20 0849   PRN Meds:.sodium chloride, Place/Maintain arterial line **AND** sodium chloride, acetaminophen, heparin, ondansetron (ZOFRAN) IV, sodium chloride flush, sodium chloride flush   Louie Bun,  MD 12/13/2020, 10:38 AM

## 2020-12-13 NOTE — Plan of Care (Signed)
  Problem: Activity: Goal: Capacity to carry out activities will improve Outcome: Progressing   Problem: Cardiac: Goal: Ability to achieve and maintain adequate cardiopulmonary perfusion will improve Outcome: Progressing   Problem: Education: Goal: Knowledge of General Education information will improve Description: Including pain rating scale, medication(s)/side effects and non-pharmacologic comfort measures Outcome: Progressing   Problem: Clinical Measurements: Goal: Diagnostic test results will improve Outcome: Progressing Goal: Respiratory complications will improve Outcome: Progressing Goal: Cardiovascular complication will be avoided Outcome: Progressing   Problem: Activity: Goal: Risk for activity intolerance will decrease Outcome: Progressing   Problem: Nutrition: Goal: Adequate nutrition will be maintained Outcome: Progressing   Problem: Elimination: Goal: Will not experience complications related to bowel motility Outcome: Progressing Goal: Will not experience complications related to urinary retention Outcome: Progressing

## 2020-12-13 NOTE — Progress Notes (Signed)
Subjective: Feels well today with no complaints. Excellent UOP. Continues to be net negative.  Possible bloody stool   Objective Vital signs in last 24 hours: Vitals:   12/13/20 0815 12/13/20 0830 12/13/20 0845 12/13/20 0900  BP: 114/71 108/74 132/82 107/72  Pulse: (!) 57 (!) 54 66 (!) 32  Resp: 12 11 20 11   Temp:      TempSrc:      SpO2: 100% 100% 100% 100%  Weight:      Height:       Weight change: -6.2 kg  Intake/Output Summary (Last 24 hours) at 12/13/2020 1035 Last data filed at 12/13/2020 1000 Gross per 24 hour  Intake 1623.65 ml  Output 4481 ml  Net -2857.35 ml    Assessment/ Plan: Pt is a 65 y.o. yo male -  crt 1.11 a year ago, who was admitted on 12/02/2020 with Afib and volume overload.   Initial crt 1.7 but increased in the setting of attempted diuresis and continued hemodyanamic instability  -  CRRT started on 5/12  1. Afib/cardiomyopathy/volume overload-  HF team involved-TEE with EF of 25%, moderately decreased RV function, mitral and tricuspid regurgitation.  No pressor requirement at this time.  Volume status continues to improve slowly. 2. AKI on CKD - baseline somewhere 1.1-1.7. Likely cardiorenal syndrome with ATN related to hemodynamic instability. CRRT started given volume overload on 5/12.  Volume status significantly improved and now with excellent urine output over the past 24 hours.  Stop ultrafiltration with CRRT.  Schedule Lasix for ongoing volume removal.  No restart CRRT tonight 3. Anemia-maroon stools today with worsening hemoglobin.  Management and transfusions per primary team 4. HTN/volume-volume overload improving.  Lasix 5.  Hypophosphatemia: Replacement as needed  7/12    Labs: Basic Metabolic Panel: Recent Labs  Lab 12/12/20 0454 12/12/20 1509 12/13/20 0433  NA 134* 136 136  K 4.3 4.1 4.0  CL 102 102 103  CO2 26 27 28   GLUCOSE 181* 101* 93  BUN 36* 36* 28*  CREATININE 1.59* 1.65* 1.45*  CALCIUM 8.0* 8.3* 8.3*  PHOS 2.5  3.3 2.6   Liver Function Tests: Recent Labs  Lab 12/12/20 0454 12/12/20 1509 12/13/20 0433  ALBUMIN 2.2* 2.6* 2.4*   No results for input(s): LIPASE, AMYLASE in the last 168 hours. No results for input(s): AMMONIA in the last 168 hours. CBC: Recent Labs  Lab 12/10/20 0317 12/11/20 0302 12/12/20 0454 12/12/20 1316 12/13/20 0433  WBC 11.6* 8.1 7.9 8.7 8.5  HGB 10.7* 10.6* 8.4* 9.1* 8.5*  HCT 33.6* 34.9* 26.8* 29.1* 26.8*  MCV 73.5* 75.7* 74.4* 74.0* 73.8*  PLT 157 164 129* 137* 143*   Cardiac Enzymes: No results for input(s): CKTOTAL, CKMB, CKMBINDEX, TROPONINI in the last 168 hours. CBG: Recent Labs  Lab 12/12/20 0631 12/12/20 1103 12/12/20 1515 12/12/20 1932 12/13/20 0747  GLUCAP 98 87 94 85 96    Iron Studies:  No results for input(s): IRON, TIBC, TRANSFERRIN, FERRITIN in the last 72 hours. Studies/Results: DG CHEST PORT 1 VIEW  Result Date: 12/11/2020 CLINICAL DATA:  Central line placement. EXAM: PORTABLE CHEST 1 VIEW COMPARISON:  Radiographs 12/05/2020 and 12/04/2020.  CT 07/23/2015. FINDINGS: Left IJ hemodialysis catheter projects to the confluence of the brachiocephalic veins, similar to previous study. Right arm PICC projects to the level of the superior cavoatrial junction. No pneumothorax. Stable cardiac enlargement and bibasilar atelectasis. The right pleural effusion has resolved with improving aeration of the right lung base. A small left pleural effusion  does not appear significantly changed. IMPRESSION: 1. Similar position of the left IJ hemodialysis and right arm PICC lines compared with previous study from 6 days prior. 2. No pneumothorax. 3. Improved right pleural effusion. Electronically Signed   By: Carey Bullocks M.D.   On: 12/11/2020 14:42   Medications: Infusions: .  prismasol BGK 4/2.5 500 mL/hr at 12/13/20 0512  .  prismasol BGK 4/2.5 300 mL/hr at 12/13/20 0848  . sodium chloride    . sodium chloride    . amiodarone 30 mg/hr (12/13/20 1000)   . furosemide Stopped (12/13/20 0900)  . heparin 1,350 Units/hr (12/13/20 1000)  . prismasol BGK 4/2.5 1,500 mL/hr at 12/13/20 0849    Scheduled Medications: . (feeding supplement) PROSource Plus  30 mL Oral BID BM  . atorvastatin  10 mg Oral Daily  . B-complex with vitamin C  1 tablet Oral Daily  . Chlorhexidine Gluconate Cloth  6 each Topical Daily  . feeding supplement  237 mL Oral BID BM  . insulin aspart  0-5 Units Subcutaneous QHS  . insulin aspart  0-9 Units Subcutaneous TID WC  . loratadine  10 mg Oral Daily  . midodrine  15 mg Oral TID  . montelukast  10 mg Oral QHS  . pantoprazole  40 mg Oral Daily  . senna-docusate  1 tablet Oral BID  . sodium chloride flush  10-40 mL Intracatheter Q12H  . sodium chloride flush  3 mL Intravenous Q12H    have reviewed scheduled and prn medications.  Physical Exam: General: lying in bed no destress Heart: Normal rate Lungs: Bilateral chest rise with no increased work of breathing Abdomen: obese, non tender Extremities: pitting edema throughout, warm and well perfused Dialysis Access: left IJ cath placed 5/12 by CCM    12/13/2020,10:35 AM  LOS: 11 days

## 2020-12-14 DIAGNOSIS — N179 Acute kidney failure, unspecified: Secondary | ICD-10-CM | POA: Diagnosis not present

## 2020-12-14 DIAGNOSIS — I5043 Acute on chronic combined systolic (congestive) and diastolic (congestive) heart failure: Secondary | ICD-10-CM | POA: Diagnosis not present

## 2020-12-14 LAB — RENAL FUNCTION PANEL
Albumin: 2.6 g/dL — ABNORMAL LOW (ref 3.5–5.0)
Albumin: 2.7 g/dL — ABNORMAL LOW (ref 3.5–5.0)
Anion gap: 7 (ref 5–15)
Anion gap: 8 (ref 5–15)
BUN: 30 mg/dL — ABNORMAL HIGH (ref 8–23)
BUN: 33 mg/dL — ABNORMAL HIGH (ref 8–23)
CO2: 28 mmol/L (ref 22–32)
CO2: 31 mmol/L (ref 22–32)
Calcium: 8.8 mg/dL — ABNORMAL LOW (ref 8.9–10.3)
Calcium: 8.9 mg/dL (ref 8.9–10.3)
Chloride: 99 mmol/L (ref 98–111)
Chloride: 97 mmol/L — ABNORMAL LOW (ref 98–111)
Creatinine, Ser: 2.06 mg/dL — ABNORMAL HIGH (ref 0.61–1.24)
Creatinine, Ser: 2.39 mg/dL — ABNORMAL HIGH (ref 0.61–1.24)
GFR, Estimated: 35 mL/min — ABNORMAL LOW (ref >60)
GFR, Estimated: 29 mL/min — ABNORMAL LOW (ref >60)
Glucose, Bld: 124 mg/dL — ABNORMAL HIGH (ref 70–99)
Glucose, Bld: 163 mg/dL — ABNORMAL HIGH (ref 70–99)
Phosphorus: 3.9 mg/dL (ref 2.5–4.6)
Phosphorus: 4.5 mg/dL (ref 2.5–4.6)
Potassium: 3.6 mmol/L (ref 3.5–5.1)
Potassium: 3.8 mmol/L (ref 3.5–5.1)
Sodium: 134 mmol/L — ABNORMAL LOW (ref 135–145)
Sodium: 136 mmol/L (ref 135–145)

## 2020-12-14 LAB — HEPARIN LEVEL (UNFRACTIONATED): Heparin Unfractionated: 0.55 IU/mL (ref 0.30–0.70)

## 2020-12-14 LAB — CBC
HCT: 26.2 % — ABNORMAL LOW (ref 39.0–52.0)
Hemoglobin: 8.5 g/dL — ABNORMAL LOW (ref 13.0–17.0)
MCH: 23.8 pg — ABNORMAL LOW (ref 26.0–34.0)
MCHC: 32.4 g/dL (ref 30.0–36.0)
MCV: 73.4 fL — ABNORMAL LOW (ref 80.0–100.0)
Platelets: 144 10*3/uL — ABNORMAL LOW (ref 150–400)
RBC: 3.57 MIL/uL — ABNORMAL LOW (ref 4.22–5.81)
RDW: 19 % — ABNORMAL HIGH (ref 11.5–15.5)
WBC: 9.1 10*3/uL (ref 4.0–10.5)
nRBC: 0 % (ref 0.0–0.2)

## 2020-12-14 LAB — GLUCOSE, CAPILLARY
Glucose-Capillary: 87 mg/dL (ref 70–99)
Glucose-Capillary: 136 mg/dL — ABNORMAL HIGH (ref 70–99)
Glucose-Capillary: 97 mg/dL (ref 70–99)
Glucose-Capillary: 103 mg/dL — ABNORMAL HIGH (ref 70–99)

## 2020-12-14 LAB — COOXEMETRY PANEL
Carboxyhemoglobin: 1.1 % (ref 0.5–1.5)
Methemoglobin: 0.4 % (ref 0.0–1.5)
O2 Saturation: 64.5 %
Total hemoglobin: 8.5 g/dL — ABNORMAL LOW (ref 12.0–16.0)

## 2020-12-14 LAB — MAGNESIUM: Magnesium: 2 mg/dL (ref 1.7–2.4)

## 2020-12-14 MED ORDER — POTASSIUM CHLORIDE CRYS ER 20 MEQ PO TBCR
40.0000 meq | EXTENDED_RELEASE_TABLET | Freq: Once | ORAL | Status: AC
  Administered 2020-12-14: 40 meq via ORAL
  Filled 2020-12-14: qty 2

## 2020-12-14 NOTE — Progress Notes (Signed)
Subjective: Patient feels better today.  Overall improving now off CRRT.  No pressors.  Great urine output with 4 L.  Creatinine minimally increased.   Objective Vital signs in last 24 hours: Vitals:   12/14/20 0600 12/14/20 0612 12/14/20 0700 12/14/20 0800  BP:  91/67  132/62  Pulse: 90 (!) 42  72  Resp: 11 17  (!) 22  Temp:   98.1 F (36.7 C)   TempSrc:   Oral   SpO2: 98% 96%  100%  Weight:      Height:       Weight change: -1.3 kg  Intake/Output Summary (Last 24 hours) at 12/14/2020 0831 Last data filed at 12/14/2020 0800 Gross per 24 hour  Intake 1998.49 ml  Output 4137 ml  Net -2138.51 ml    Assessment/ Plan: Pt is a 65 y.o. yo male -  crt 1.11 a year ago, who was admitted on 12/02/2020 with Afib and volume overload.   Initial crt 1.7 but increased in the setting of attempted diuresis and continued hemodyanamic instability  -  CRRT started on 5/12  1. Afib/cardiomyopathy/volume overload-  HF team involved-TEE with EF of 25%, moderately decreased RV function, mitral and tricuspid regurgitation.  No pressor requirement at this time.  Volume status continues to improve slowly. 2. AKI on CKD - baseline somewhere 1.1-1.7. Likely cardiorenal syndrome with ATN related to hemodynamic instability. CRRT started given volume overload on 5/12-5/20.  Creatinine slightly increased off CRRT which is not unexpected.  Great urine output.  Continue Lasix and monitor for renal recovery.  Likely remove dialysis catheter once creatinine is improving. 3. Anemia-maroon stools with worsening hemoglobin.  Management and transfusions per primary team 4. HTN/volume-volume overload improving.  Lasix as above  Darnell Level    Labs: Basic Metabolic Panel: Recent Labs  Lab 12/13/20 0433 12/13/20 1529 12/14/20 0437  NA 136 134* 134*  K 4.0 3.9 3.6  CL 103 99 99  CO2 28 27 28   GLUCOSE 93 183* 124*  BUN 28* 25* 30*  CREATININE 1.45* 1.67* 2.06*  CALCIUM 8.3* 8.2* 8.8*  PHOS 2.6 1.8* 3.9    Liver Function Tests: Recent Labs  Lab 12/13/20 0433 12/13/20 1529 12/14/20 0437  ALBUMIN 2.4* 2.6* 2.6*   No results for input(s): LIPASE, AMYLASE in the last 168 hours. No results for input(s): AMMONIA in the last 168 hours. CBC: Recent Labs  Lab 12/10/20 0317 12/11/20 0302 12/12/20 0454 12/12/20 1316 12/13/20 0433  WBC 11.6* 8.1 7.9 8.7 8.5  HGB 10.7* 10.6* 8.4* 9.1* 8.5*  HCT 33.6* 34.9* 26.8* 29.1* 26.8*  MCV 73.5* 75.7* 74.4* 74.0* 73.8*  PLT 157 164 129* 137* 143*   Cardiac Enzymes: No results for input(s): CKTOTAL, CKMB, CKMBINDEX, TROPONINI in the last 168 hours. CBG: Recent Labs  Lab 12/13/20 0747 12/13/20 1107 12/13/20 1606 12/13/20 1958 12/14/20 0640  GLUCAP 96 99 108* 118* 87    Iron Studies:  No results for input(s): IRON, TIBC, TRANSFERRIN, FERRITIN in the last 72 hours. Studies/Results: No results found. Medications: Infusions: .  prismasol BGK 4/2.5 Stopped (12/13/20 1728)  .  prismasol BGK 4/2.5 Stopped (12/13/20 1728)  . sodium chloride    . sodium chloride    . amiodarone 30 mg/hr (12/14/20 0800)  . furosemide 62 mL/hr at 12/14/20 0800  . heparin 1,350 Units/hr (12/14/20 0800)  . prismasol BGK 4/2.5 Stopped (12/13/20 1728)    Scheduled Medications: . (feeding supplement) PROSource Plus  30 mL Oral BID BM  . atorvastatin  10 mg Oral Daily  . B-complex with vitamin C  1 tablet Oral Daily  . Chlorhexidine Gluconate Cloth  6 each Topical Daily  . feeding supplement  237 mL Oral BID BM  . insulin aspart  0-5 Units Subcutaneous QHS  . insulin aspart  0-9 Units Subcutaneous TID WC  . loratadine  10 mg Oral Daily  . midodrine  15 mg Oral TID  . montelukast  10 mg Oral QHS  . pantoprazole  40 mg Oral Daily  . senna-docusate  1 tablet Oral BID  . sodium chloride flush  10-40 mL Intracatheter Q12H  . sodium chloride flush  3 mL Intravenous Q12H    have reviewed scheduled and prn medications.  Physical Exam: General: lying in bed no  destress Heart: Normal rate Lungs: Bilateral chest rise with no increased work of breathing Abdomen: obese, non tender Extremities: Improved pitting edema in the bilateral lower extremities, warm and well perfused Dialysis Access: left IJ cath placed 5/12 by CCM    12/14/2020,8:31 AM  LOS: 12 days

## 2020-12-14 NOTE — Progress Notes (Signed)
Patient ID: Kevin [name], male   DOB: 10-02-1955, 65 y.o.   MRN: [id number]     Advanced Heart Failure Rounding Note  PCP-Cardiologist: Chrystie Nose, MD   Subjective:    - CVVH started 5/12 with intractable volume overload.   - TEE/DCCV>>NSR 5/16 - CVVH stopped 5/20  In and out of atrial fibrillation.  Currently appears NSR. On amiodarone 30 mg/hr and heparin gtt.    Excellent UOP, weight down 3 lbs.  Off CVVH and getting Lasix 120 mg IV bid.  CVP 8 this morning.  Creatinine mildly higher at 2.06 off CVVH.   No CBC yet this morning.   Co-ox 64.5%, no pressors/inotropes.   Feels ok, has not been out of bed.  TEE: Moderate LV dilation with EF 25%, moderately decreased RV function, moderate functional MR, mod-severe TR.    Objective:   Weight Range: 111.1 kg Body mass index is 32.31 kg/m.   Vital Signs:   Temp:  [98.1 F (36.7 C)-98.9 F (37.2 C)] 98.1 F (36.7 C) (05/21 0700) Pulse Rate:  [32-131] 72 (05/21 0800) Resp:  [11-25] 22 (05/21 0800) BP: (91-137)/(46-101) 132/62 (05/21 0800) SpO2:  [88 %-100 %] 100 % (05/21 0800) Weight:  [111.1 kg] 111.1 kg (05/21 0411) Last BM Date: 12/13/20  Weight change: Filed Weights   12/12/20 0500 12/13/20 0500 12/14/20 0411  Weight: 118.6 kg 112.4 kg 111.1 kg    Intake/Output:   Intake/Output Summary (Last 24 hours) at 12/14/2020 0841 Last data filed at 12/14/2020 0800 Gross per 24 hour  Intake 1998.49 ml  Output 4137 ml  Net -2138.51 ml      Physical Exam  CVP 8 General: NAD Neck: JVP 8 cm, no thyromegaly or thyroid nodule.  Lungs: Clear to auscultation bilaterally with normal respiratory effort. CV: Nondisplaced PMI.  Heart regular S1/S2, no S3/S4, no murmur.  1+ ankle edema. Abdomen: Soft, nontender, no hepatosplenomegaly, no distention.  Skin: Intact without lesions or rashes.  Neurologic: Alert and oriented x 3.  Psych: Normal affect. Extremities: No clubbing or cyanosis.  HEENT: Normal.    Telemetry    NSR in 60s with PVCs, alternates with atrial fibrillation (personally reviewed)  Labs    CBC Recent Labs    12/12/20 1316 12/13/20 0433  WBC 8.7 8.5  HGB 9.1* 8.5*  HCT 29.1* 26.8*  MCV 74.0* 73.8*  PLT 137* 143*   Basic Metabolic Panel Recent Labs    17/40/81 0433 12/13/20 1529 12/14/20 0437  NA 136 134* 134*  K 4.0 3.9 3.6  CL 103 99 99  CO2 28 27 28   GLUCOSE 93 183* 124*  BUN 28* 25* 30*  CREATININE 1.45* 1.67* 2.06*  CALCIUM 8.3* 8.2* 8.8*  MG 2.2  --  2.0  PHOS 2.6 1.8* 3.9   Liver Function Tests Recent Labs    12/13/20 1529 12/14/20 0437  ALBUMIN 2.6* 2.6*   No results for input(s): LIPASE, AMYLASE in the last 72 hours. Cardiac Enzymes No results for input(s): CKTOTAL, CKMB, CKMBINDEX, TROPONINI in the last 72 hours.  BNP: BNP (last 3 results) Recent Labs    12/02/20 0915  BNP 1,158.5*    ProBNP (last 3 results) No results for input(s): PROBNP in the last 8760 hours.   D-Dimer No results for input(s): DDIMER in the last 72 hours. Hemoglobin A1C No results for input(s): HGBA1C in the last 72 hours. Fasting Lipid Panel No results for input(s): CHOL, HDL, LDLCALC, TRIG, CHOLHDL, LDLDIRECT in the last 72 hours. Thyroid Function  Tests No results for input(s): TSH, T4TOTAL, T3FREE, THYROIDAB in the last 72 hours.  Invalid input(s): FREET3  Other results:   Imaging    No results found.   Medications:     Scheduled Medications: . (feeding supplement) PROSource Plus  30 mL Oral BID BM  . atorvastatin  10 mg Oral Daily  . B-complex with vitamin C  1 tablet Oral Daily  . Chlorhexidine Gluconate Cloth  6 each Topical Daily  . feeding supplement  237 mL Oral BID BM  . insulin aspart  0-5 Units Subcutaneous QHS  . insulin aspart  0-9 Units Subcutaneous TID WC  . loratadine  10 mg Oral Daily  . midodrine  15 mg Oral TID  . montelukast  10 mg Oral QHS  . pantoprazole  40 mg Oral Daily  . senna-docusate  1 tablet Oral BID  . sodium  chloride flush  10-40 mL Intracatheter Q12H  . sodium chloride flush  3 mL Intravenous Q12H    Infusions: .  prismasol BGK 4/2.5 Stopped (12/13/20 1728)  .  prismasol BGK 4/2.5 Stopped (12/13/20 1728)  . sodium chloride    . sodium chloride    . amiodarone 60 mg/hr (12/14/20 0838)  . heparin 1,350 Units/hr (12/14/20 0800)  . prismasol BGK 4/2.5 Stopped (12/13/20 1728)    PRN Medications: sodium chloride, Place/Maintain arterial line **AND** sodium chloride, acetaminophen, heparin, ondansetron (ZOFRAN) IV, sodium chloride flush, sodium chloride flush    Assessment/Plan   1. Acute on chronic systolic CHF: Nonischemic cardiomyopathy.  Echo this admission with EF 25-30%, moderate LV dilation, mild LVH, moderate RVE with moderately decreased RV systolic function, severe biatrial enlargement, moderate-severe TR, IVC dilated. EF was low starting in 2016, cath at that time showed mild nonobstructive CAD.  Patient had runs of AF but not sustained when initial diagnosis was made.  Was readmitted in 2018 with persistent AF and CHF, echo with EF 20-25%.  Had DCCV back to NSR at that time.  EF up to 55-60% in 2019.  Lost to followup, now back with progressive dyspnea/volume overload and EF back down in setting of AF with RVR.  Difficult to identify initial etiology of CMP but cannot rule out tachy-mediated.  UDS negative for cocaine. 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  He does not feel AF and we are not sure how long he has been in it.  He was 30+ lbs up with marked volume overload and concern for low output HF (progressive rise in creatinine with attempts at diuresis).  Poor response to initiation of milrinone 0.25 then norepinephrine, creatinine continued to rise and CVVH started 5/12. CVVH stopped 5/20 with good UOP now on IV Lasix. In and out of atrial fibrillation.  Now off milrinone and NE, CO-OX 64.5%.  CVP 8.  - Lasix 120 mg IV this morning then stop for now.  Reassess  tomorrow, ?start torsemide.  - Will need eventual RHC, consider repeat coronary angiography if creatinine stabilizes well => HS-TnI slightly elevated with no trend so no ACS.  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   - Want to maintain NSR, continue amiodarone (increase dose as below).  - Consider eventual cardiac MRI.  - Unna boots.  - Continue midodrine to 15 tid, may be able to titrate down soon.   2. Atrial fibrillation: Admitted with afib/RVR, uncertain how long this has been present.  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 S/p TEE/DCCV 5/16. Has been in and out of atrial fibrillation  since then.  - Increase amiodarone to 60 mg/hr for now.  - May not be ranolazine candidate given prolonged QT interval, repeat ECG today.   - On heparin drip.  3. AKI: Uncertain baseline, creatinine 1.77 at arrival, creatinine up to 3.49 with poor diuresis and CVVH started with intractable volume overload. Now off CVVH.  Creatinine 2.06 with CVP 8.  4. Hematuria: Seen by urology => think old blood in urine, renal ultrasound ok.  Recommend continuing heparin gtt.  Has foley. No further gross hematuria  5. Iron-deficieny anemia: Maroon stool has been seen.  - received feraheme  - Needs CBC today.  6. Deconditioning: Consult PT. OOB.   CRITICAL CARE Performed by: Marca Ancona  Total critical care time: 35 minutes  Critical care time was exclusive of separately billable procedures and treating other patients.  Critical care was necessary to treat or prevent imminent or life-threatening deterioration.  Critical care was time spent personally by me on the following activities: development of treatment plan with patient and/or surrogate as well as nursing, discussions with consultants, evaluation of patient's response to treatment, examination of patient, obtaining history from patient or surrogate, ordering and performing treatments and interventions, ordering and review of laboratory studies,  ordering and review of radiographic studies, pulse oximetry and re-evaluation of patient's condition.   Length of Stay: 62  Marca Ancona, MD  12/14/2020, 8:41 AM  Advanced Heart Failure Team Pager [phone] (M-F; 7a - 5p)  Please contact [facility] for night-coverage after hours (5p -7a ) and weekends on [email or web address]

## 2020-12-14 NOTE — Progress Notes (Signed)
ANTICOAGULATION CONSULT NOTE - Follow Up Consult  Pharmacy Consult for heparin Indication: atrial fibrillation  Labs: Recent Labs    12/12/20 0454 12/12/20 0700 12/12/20 1316 12/12/20 1509 12/13/20 0433 12/13/20 0623 12/13/20 1529 12/14/20 0437  HGB 8.4*  --  9.1*  --  8.5*  --   --   --   HCT 26.8*  --  29.1*  --  26.8*  --   --   --   PLT 129*  --  137*  --  143*  --   --   --   HEPARINUNFRC >1.10*   < >  --   --  >1.10* 0.67  --  0.55  CREATININE 1.59*  --   --    < > 1.45*  --  1.67* 2.06*   < > = values in this interval not displayed.    Assessment/Plan:  65 y/o male on heparin for Afib while holding home Eliquis. Pt s/p DCCV 5/16. Patient continues to have intermittently irregular rhythm.   Heparin level at goal this AM.    Patient did have maroon stool x2 episodes.  CBC in process this AM  Goal of Therapy:  Heparin level 0.3-0.7 units/ml Monitor platelets by anticoagulation protocol: Yes  Plan: Continue heparin at 1350 units/h Daily heparin level and CBC F/u s/sx bleeding   Jenetta Downer, [facility] Clinical Pharmacist  12/14/2020 7:54 AM   [facility] pharmacy phone numbers are listed on [email or web address]

## 2020-12-15 DIAGNOSIS — I5043 Acute on chronic combined systolic (congestive) and diastolic (congestive) heart failure: Secondary | ICD-10-CM | POA: Diagnosis not present

## 2020-12-15 DIAGNOSIS — N179 Acute kidney failure, unspecified: Secondary | ICD-10-CM | POA: Diagnosis not present

## 2020-12-15 LAB — GLUCOSE, CAPILLARY
Glucose-Capillary: 94 mg/dL (ref 70–99)
Glucose-Capillary: 131 mg/dL — ABNORMAL HIGH (ref 70–99)
Glucose-Capillary: 107 mg/dL — ABNORMAL HIGH (ref 70–99)
Glucose-Capillary: 104 mg/dL — ABNORMAL HIGH (ref 70–99)

## 2020-12-15 LAB — RENAL FUNCTION PANEL
Albumin: 2.6 g/dL — ABNORMAL LOW (ref 3.5–5.0)
Anion gap: 8 (ref 5–15)
BUN: 33 mg/dL — ABNORMAL HIGH (ref 8–23)
CO2: 30 mmol/L (ref 22–32)
Calcium: 8.9 mg/dL (ref 8.9–10.3)
Chloride: 96 mmol/L — ABNORMAL LOW (ref 98–111)
Creatinine, Ser: 2.57 mg/dL — ABNORMAL HIGH (ref 0.61–1.24)
GFR, Estimated: 27 mL/min — ABNORMAL LOW (ref >60)
Glucose, Bld: 170 mg/dL — ABNORMAL HIGH (ref 70–99)
Phosphorus: 4.9 mg/dL — ABNORMAL HIGH (ref 2.5–4.6)
Potassium: 3.6 mmol/L (ref 3.5–5.1)
Sodium: 134 mmol/L — ABNORMAL LOW (ref 135–145)

## 2020-12-15 LAB — CBC
HCT: 25.5 % — ABNORMAL LOW (ref 39.0–52.0)
Hemoglobin: 8.1 g/dL — ABNORMAL LOW (ref 13.0–17.0)
MCH: 23.4 pg — ABNORMAL LOW (ref 26.0–34.0)
MCHC: 31.8 g/dL (ref 30.0–36.0)
MCV: 73.7 fL — ABNORMAL LOW (ref 80.0–100.0)
Platelets: 166 10*3/uL (ref 150–400)
RBC: 3.46 MIL/uL — ABNORMAL LOW (ref 4.22–5.81)
RDW: 19.4 % — ABNORMAL HIGH (ref 11.5–15.5)
WBC: 11 10*3/uL — ABNORMAL HIGH (ref 4.0–10.5)
nRBC: 0 % (ref 0.0–0.2)

## 2020-12-15 LAB — COOXEMETRY PANEL
Carboxyhemoglobin: 1.1 % (ref 0.5–1.5)
Methemoglobin: 1 % (ref 0.0–1.5)
O2 Saturation: 71.4 %
Total hemoglobin: 8.6 g/dL — ABNORMAL LOW (ref 12.0–16.0)

## 2020-12-15 LAB — HEPARIN LEVEL (UNFRACTIONATED): Heparin Unfractionated: 0.63 IU/mL (ref 0.30–0.70)

## 2020-12-15 LAB — MAGNESIUM: Magnesium: 1.7 mg/dL (ref 1.7–2.4)

## 2020-12-15 MED ORDER — POTASSIUM CHLORIDE CRYS ER 20 MEQ PO TBCR
40.0000 meq | EXTENDED_RELEASE_TABLET | Freq: Once | ORAL | Status: AC
  Administered 2020-12-15: 40 meq via ORAL
  Filled 2020-12-15: qty 2

## 2020-12-15 MED ORDER — MIDODRINE HCL 5 MG PO TABS
10.0000 mg | ORAL_TABLET | Freq: Three times a day (TID) | ORAL | Status: DC
  Administered 2020-12-15 – 2020-12-19 (×13): 10 mg via ORAL
  Filled 2020-12-15 (×13): qty 2

## 2020-12-15 MED ORDER — SODIUM CHLORIDE 0.9% FLUSH
3.0000 mL | Freq: Two times a day (BID) | INTRAVENOUS | Status: DC
  Administered 2020-12-15 – 2020-12-16 (×2): 3 mL via INTRAVENOUS

## 2020-12-15 MED ORDER — MAGNESIUM SULFATE IN D5W 1-5 GM/100ML-% IV SOLN
1.0000 g | Freq: Once | INTRAVENOUS | Status: AC
  Administered 2020-12-15: 1 g via INTRAVENOUS
  Filled 2020-12-15: qty 100

## 2020-12-15 MED ORDER — TORSEMIDE 20 MG PO TABS
40.0000 mg | ORAL_TABLET | Freq: Every day | ORAL | Status: DC
  Administered 2020-12-15: 40 mg via ORAL
  Filled 2020-12-15: qty 2

## 2020-12-15 NOTE — Progress Notes (Signed)
ANTICOAGULATION CONSULT NOTE - Follow Up Consult  Pharmacy Consult for heparin Indication: atrial fibrillation  Labs: Recent Labs    12/13/20 0433 12/13/20 0623 12/13/20 1529 12/14/20 0437 12/14/20 0845 12/14/20 1600 12/15/20 0323 12/15/20 0324  HGB 8.5*  --   --   --  8.5*  --   --  8.1*  HCT 26.8*  --   --   --  26.2*  --   --  25.5*  PLT 143*  --   --   --  144*  --   --  166  HEPARINUNFRC >1.10* 0.67  --  0.55  --   --   --  0.63  CREATININE 1.45*  --    < > 2.06*  --  2.39* 2.57*  --    < > = values in this interval not displayed.    Assessment/Plan:  65 y/o male on heparin for Afib while holding home Eliquis. Pt s/p DCCV 5/16. Patient continues to have intermittently irregular rhythm.   Heparin level at goal this AM, but have been trending toward high end.    Patient did have maroon stool x2 episodes.  CBC fairly stable.  Goal of Therapy:  Heparin level 0.3-0.7 units/ml Monitor platelets by anticoagulation protocol: Yes  Plan: Reduce heparin just a little to 1300 units/hr to keep in therapeutic range. Daily heparin level and CBC F/u s/sx bleeding   Reece Leader, Colon Flattery, [facility] Clinical Pharmacist  12/15/2020 7:56 AM   [facility] pharmacy phone numbers are listed on [email or web address]

## 2020-12-15 NOTE — Progress Notes (Signed)
Subjective: Patient continues to feel well today.  Continues to have good urine output.  Creatinine did rise today to 2.6.   Objective Vital signs in last 24 hours: Vitals:   12/15/20 0600 12/15/20 0645 12/15/20 0700 12/15/20 0800  BP: 126/70  112/77   Pulse: (!) 38  (!) 39 (!) 39  Resp: 17  19 (!) 26  Temp:  98.7 F (37.1 C)    TempSrc:  Oral    SpO2: 99%  99% 93%  Weight:      Height:       Weight change: -0.3 kg  Intake/Output Summary (Last 24 hours) at 12/15/2020 0858 Last data filed at 12/15/2020 0800 Gross per 24 hour  Intake 1276.05 ml  Output 3875 ml  Net -2598.95 ml    Assessment/ Plan: Pt is a 65 y.o. yo male -  crt 1.11 a year ago, who was admitted on 12/02/2020 with Afib and volume overload.   Initial crt 1.7 but increased in the setting of attempted diuresis and continued hemodyanamic instability  -  CRRT started on 5/12  1. Afib/cardiomyopathy/volume overload-  HF team involved-TEE with EF of 25%, moderately decreased RV function, mitral and tricuspid regurgitation.  No pressor requirement at this time.  Volume status continues to improve slowly. 2. AKI on CKD - baseline somewhere 1.1-1.7. Likely cardiorenal syndrome with ATN related to hemodynamic instability. CRRT started given volume overload on 5/12-5/20.  Creatinine increasing off of CRRT but still hopeful for renal recovery given good urine output.  Hold Lasix for today and monitor creatinine tomorrow.  Remove dialysis catheter when able. 3. Anemia-some maroon stools this hospitalization.  Hemoglobin fairly stable today.  Continue to monitor 4. HTN/volume-volume overload improving.  Holding Lasix today  Darnell Level    Labs: Basic Metabolic Panel: Recent Labs  Lab 12/14/20 0437 12/14/20 1600 12/15/20 0323  NA 134* 136 134*  K 3.6 3.8 3.6  CL 99 97* 96*  CO2 28 31 30   GLUCOSE 124* 163* 170*  BUN 30* 33* 33*  CREATININE 2.06* 2.39* 2.57*  CALCIUM 8.8* 8.9 8.9  PHOS 3.9 4.5 4.9*   Liver Function  Tests: Recent Labs  Lab 12/14/20 0437 12/14/20 1600 12/15/20 0323  ALBUMIN 2.6* 2.7* 2.6*   No results for input(s): LIPASE, AMYLASE in the last 168 hours. No results for input(s): AMMONIA in the last 168 hours. CBC: Recent Labs  Lab 12/12/20 0454 12/12/20 1316 12/13/20 0433 12/14/20 0845 12/15/20 0324  WBC 7.9 8.7 8.5 9.1 11.0*  HGB 8.4* 9.1* 8.5* 8.5* 8.1*  HCT 26.8* 29.1* 26.8* 26.2* 25.5*  MCV 74.4* 74.0* 73.8* 73.4* 73.7*  PLT 129* 137* 143* 144* 166   Cardiac Enzymes: No results for input(s): CKTOTAL, CKMB, CKMBINDEX, TROPONINI in the last 168 hours. CBG: Recent Labs  Lab 12/14/20 0640 12/14/20 1112 12/14/20 1542 12/14/20 2114 12/15/20 0643  GLUCAP 87 136* 97 103* 94    Iron Studies:  No results for input(s): IRON, TIBC, TRANSFERRIN, FERRITIN in the last 72 hours. Studies/Results: No results found. Medications: Infusions: .  prismasol BGK 4/2.5 Stopped (12/13/20 1728)  .  prismasol BGK 4/2.5 Stopped (12/13/20 1728)  . sodium chloride    . sodium chloride    . amiodarone 30 mg/hr (12/15/20 0800)  . heparin 1,300 Units/hr (12/15/20 0800)  . prismasol BGK 4/2.5 Stopped (12/13/20 1728)    Scheduled Medications: . (feeding supplement) PROSource Plus  30 mL Oral BID BM  . atorvastatin  10 mg Oral Daily  . B-complex  with vitamin C  1 tablet Oral Daily  . Chlorhexidine Gluconate Cloth  6 each Topical Daily  . feeding supplement  237 mL Oral BID BM  . insulin aspart  0-5 Units Subcutaneous QHS  . insulin aspart  0-9 Units Subcutaneous TID WC  . loratadine  10 mg Oral Daily  . midodrine  10 mg Oral TID WC  . montelukast  10 mg Oral QHS  . pantoprazole  40 mg Oral Daily  . senna-docusate  1 tablet Oral BID  . sodium chloride flush  10-40 mL Intracatheter Q12H  . sodium chloride flush  3 mL Intravenous Q12H  . sodium chloride flush  3 mL Intravenous Q12H  . torsemide  40 mg Oral Daily    have reviewed scheduled and prn medications.  Physical  Exam: General: lying in bed no destress Heart: Normal rate Lungs: Bilateral chest rise with no increased work of breathing Abdomen: obese, non tender Extremities: Improved pitting edema in the bilateral lower extremities, warm and well perfused, wrapped in ace Dialysis Access: left IJ cath placed 5/12 by CCM    12/15/2020,8:58 AM  LOS: 13 days

## 2020-12-15 NOTE — Progress Notes (Signed)
Patient ID: Kevin [name], male   DOB: October 20, 1955, 65 y.o.   MRN: [id number]     Advanced Heart Failure Rounding Note  PCP-Cardiologist: Chrystie Nose, MD   Subjective:    - CVVH started 5/12 with intractable volume overload.   - TEE/DCCV>>NSR 5/16 - CVVH stopped 5/20  This morning, he is in NSR with frequent PACs.  On amiodarone 30 mg/hr and heparin gtt.    Excellent UOP again, stable weight.  CVP 9-10.  Creatinine higher at 2.57.    Co-ox 71%, no pressors/inotropes.   Feels ok, has not been out of bed.  TEE: Moderate LV dilation with EF 25%, moderately decreased RV function, moderate functional MR, mod-severe TR.    Objective:   Weight Range: 110.8 kg Body mass index is 32.23 kg/m.   Vital Signs:   Temp:  [98.6 F (37 C)-98.8 F (37.1 C)] 98.7 F (37.1 C) (05/22 0645) Pulse Rate:  [31-97] 39 (05/22 0800) Resp:  [12-26] 26 (05/22 0800) BP: (103-147)/(46-84) 112/77 (05/22 0700) SpO2:  [77 %-100 %] 93 % (05/22 0800) Weight:  [110.8 kg] 110.8 kg (05/22 0500) Last BM Date: 12/13/20  Weight change: Filed Weights   12/13/20 0500 12/14/20 0411 12/15/20 0500  Weight: 112.4 kg 111.1 kg 110.8 kg    Intake/Output:   Intake/Output Summary (Last 24 hours) at 12/15/2020 0813 Last data filed at 12/15/2020 0800 Gross per 24 hour  Intake 1276.05 ml  Output 3875 ml  Net -2598.95 ml      Physical Exam  CVP 9-10 General: NAD Neck: JVP 8-9 cm, no thyromegaly or thyroid nodule.  Lungs: Decreased at bases.  CV: Nonpalpable PMI.  Heart regular S1/S2, no S3/S4, no murmur.  1+ ankle edema. Abdomen: Soft, nontender, no hepatosplenomegaly, no distention.  Skin: Intact without lesions or rashes.  Neurologic: Alert and oriented x 3.  Psych: Normal affect. Extremities: No clubbing or cyanosis.  HEENT: Normal.    Telemetry   NSR in 70s with frequent PACs (personally reviewed)  Labs    CBC Recent Labs    12/14/20 0845 12/15/20 0324  WBC 9.1 11.0*  HGB 8.5* 8.1*  HCT  26.2* 25.5*  MCV 73.4* 73.7*  PLT 144* 166   Basic Metabolic Panel Recent Labs    54/65/68 0437 12/14/20 1600 12/15/20 0323 12/15/20 0324  NA 134* 136 134*  --   K 3.6 3.8 3.6  --   CL 99 97* 96*  --   CO2 28 31 30   --   GLUCOSE 124* 163* 170*  --   BUN 30* 33* 33*  --   CREATININE 2.06* 2.39* 2.57*  --   CALCIUM 8.8* 8.9 8.9  --   MG 2.0  --   --  1.7  PHOS 3.9 4.5 4.9*  --    Liver Function Tests Recent Labs    12/14/20 1600 12/15/20 0323  ALBUMIN 2.7* 2.6*   No results for input(s): LIPASE, AMYLASE in the last 72 hours. Cardiac Enzymes No results for input(s): CKTOTAL, CKMB, CKMBINDEX, TROPONINI in the last 72 hours.  BNP: BNP (last 3 results) Recent Labs    12/02/20 0915  BNP 1,158.5*    ProBNP (last 3 results) No results for input(s): PROBNP in the last 8760 hours.   D-Dimer No results for input(s): DDIMER in the last 72 hours. Hemoglobin A1C No results for input(s): HGBA1C in the last 72 hours. Fasting Lipid Panel No results for input(s): CHOL, HDL, LDLCALC, TRIG, CHOLHDL, LDLDIRECT in the last 72  hours. Thyroid Function Tests No results for input(s): TSH, T4TOTAL, T3FREE, THYROIDAB in the last 72 hours.  Invalid input(s): FREET3  Other results:   Imaging    No results found.   Medications:     Scheduled Medications: . (feeding supplement) PROSource Plus  30 mL Oral BID BM  . atorvastatin  10 mg Oral Daily  . B-complex with vitamin C  1 tablet Oral Daily  . Chlorhexidine Gluconate Cloth  6 each Topical Daily  . feeding supplement  237 mL Oral BID BM  . insulin aspart  0-5 Units Subcutaneous QHS  . insulin aspart  0-9 Units Subcutaneous TID WC  . loratadine  10 mg Oral Daily  . midodrine  15 mg Oral TID  . montelukast  10 mg Oral QHS  . pantoprazole  40 mg Oral Daily  . senna-docusate  1 tablet Oral BID  . sodium chloride flush  10-40 mL Intracatheter Q12H  . sodium chloride flush  3 mL Intravenous Q12H  . sodium chloride flush   3 mL Intravenous Q12H  . torsemide  40 mg Oral Daily    Infusions: .  prismasol BGK 4/2.5 Stopped (12/13/20 1728)  .  prismasol BGK 4/2.5 Stopped (12/13/20 1728)  . sodium chloride    . sodium chloride    . amiodarone 30 mg/hr (12/15/20 0800)  . heparin 1,300 Units/hr (12/15/20 0800)  . prismasol BGK 4/2.5 Stopped (12/13/20 1728)    PRN Medications: sodium chloride, Place/Maintain arterial line **AND** sodium chloride, acetaminophen, heparin, ondansetron (ZOFRAN) IV, sodium chloride flush, sodium chloride flush    Assessment/Plan   1. Acute on chronic systolic CHF: Nonischemic cardiomyopathy.  Echo this admission with EF 25-30%, moderate LV dilation, mild LVH, moderate RVE with moderately decreased RV systolic function, severe biatrial enlargement, moderate-severe TR, IVC dilated. EF was low starting in 2016, cath at that time showed mild nonobstructive CAD.  Patient had runs of AF but not sustained when initial diagnosis was made.  Was readmitted in 2018 with persistent AF and CHF, echo with EF 20-25%.  Had DCCV back to NSR at that time.  EF up to 55-60% in 2019.  Lost to followup, now back with progressive dyspnea/volume overload and EF back down in setting of AF with RVR.  Difficult to identify initial etiology of CMP but cannot rule out tachy-mediated.  UDS negative for cocaine. 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  He does not feel AF and we are not sure how long he has been in it.  He was 30+ lbs up with marked volume overload and concern for low output HF (progressive rise in creatinine with attempts at diuresis).  Poor response to initiation of milrinone 0.25 then norepinephrine, creatinine continued to rise and CVVH started 5/12. CVVH stopped 5/20 with good UOP on IV Lasix. NSR this morning with PACs.  Now off milrinone and NE, CO-OX 70%.  CVP 9-10.  - Torsemide 40 mg po daily.  - RHC tomorrow, discussed risks/benefits with patient and he agrees.  Hold off  on coronary angiography with renal dysfunction.   - Want to maintain NSR, continue amiodarone.  - Consider eventual cardiac MRI.  - Unna boots.  - BP stable, decrease midodrine to 10 mg tid.   2. Atrial fibrillation: Admitted with afib/RVR, uncertain how long this has been present.  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 S/p TEE/DCCV 5/16. Has been in and out of atrial fibrillation, currently in NSR with PACs.  - Continue amiodarone gtt at  30 mg/hr for now.  - QTc ok on last ECG, if creatinine stabilizes may be able to use ranolazine as adjunct to help maintain NSR.   - On heparin drip.  3. AKI: Uncertain baseline, creatinine 1.77 at arrival, creatinine up to 3.49 with poor diuresis and CVVH started with intractable volume overload. Now off CVVH.  Creatinine 2.57 now with CVP 9-10. - Follow creatinine closely, hopefully will stabilize soon.   4. Hematuria: Seen by urology => think old blood in urine, renal ultrasound ok.  Recommend continuing heparin gtt.  Has foley. No further gross hematuria  5. Iron-deficieny anemia: Maroon stool has been seen. Hgb down to 8.1 today.  - received feraheme  - Follow closely, transfuse hgb < 8.   6. Deconditioning: Consult PT. OOB.   CRITICAL CARE Performed by: Marca Ancona  Total critical care time: 35 minutes  Critical care time was exclusive of separately billable procedures and treating other patients.  Critical care was necessary to treat or prevent imminent or life-threatening deterioration.  Critical care was time spent personally by me on the following activities: development of treatment plan with patient and/or surrogate as well as nursing, discussions with consultants, evaluation of patient's response to treatment, examination of patient, obtaining history from patient or surrogate, ordering and performing treatments and interventions, ordering and review of laboratory studies, ordering and review of radiographic studies, pulse oximetry and  re-evaluation of patient's condition.   Length of Stay: 54  Marca Ancona, MD  12/15/2020, 8:13 AM  Advanced Heart Failure Team Pager [phone] (M-F; 7a - 5p)  Please contact [facility] for night-coverage after hours (5p -7a ) and weekends on [email or web address]

## 2020-12-16 ENCOUNTER — Encounter (HOSPITAL_COMMUNITY): Payer: Self-pay | Admitting: Cardiology

## 2020-12-16 ENCOUNTER — Encounter (HOSPITAL_COMMUNITY)
Admission: EM | Disposition: A | Discharge status: Home / Self Care | Payer: Self-pay | Source: Home / Self Care | Attending: Cardiology

## 2020-12-16 DIAGNOSIS — I509 Heart failure, unspecified: Secondary | ICD-10-CM | POA: Diagnosis not present

## 2020-12-16 DIAGNOSIS — I5043 Acute on chronic combined systolic (congestive) and diastolic (congestive) heart failure: Secondary | ICD-10-CM | POA: Diagnosis not present

## 2020-12-16 DIAGNOSIS — N179 Acute kidney failure, unspecified: Secondary | ICD-10-CM | POA: Diagnosis not present

## 2020-12-16 HISTORY — PX: RIGHT HEART CATH: CATH118263

## 2020-12-16 LAB — POCT I-STAT EG7
Acid-Base Excess: 7 mmol/L — ABNORMAL HIGH (ref 0.0–2.0)
Acid-Base Excess: 8 mmol/L — ABNORMAL HIGH (ref 0.0–2.0)
Bicarbonate: 32.3 mmol/L — ABNORMAL HIGH (ref 20.0–28.0)
Bicarbonate: 34 mmol/L — ABNORMAL HIGH (ref 20.0–28.0)
Calcium, Ion: 1.24 mmol/L (ref 1.15–1.40)
Calcium, Ion: 1.26 mmol/L (ref 1.15–1.40)
HCT: 29 % — ABNORMAL LOW (ref 39.0–52.0)
HCT: 29 % — ABNORMAL LOW (ref 39.0–52.0)
Hemoglobin: 9.9 g/dL — ABNORMAL LOW (ref 13.0–17.0)
Hemoglobin: 9.9 g/dL — ABNORMAL LOW (ref 13.0–17.0)
O2 Saturation: 57 %
O2 Saturation: 58 %
Potassium: 3.9 mmol/L (ref 3.5–5.1)
Potassium: 3.9 mmol/L (ref 3.5–5.1)
Sodium: 141 mmol/L (ref 135–145)
Sodium: 141 mmol/L (ref 135–145)
TCO2: 34 mmol/L — ABNORMAL HIGH (ref 22–32)
TCO2: 36 mmol/L — ABNORMAL HIGH (ref 22–32)
pCO2, Ven: 50 mmHg (ref 44.0–60.0)
pCO2, Ven: 53.2 mmHg (ref 44.0–60.0)
pH, Ven: 7.414 (ref 7.250–7.430)
pH, Ven: 7.418 (ref 7.250–7.430)
pO2, Ven: 30 mmHg — CL (ref 32.0–45.0)
pO2, Ven: 31 mmHg — CL (ref 32.0–45.0)

## 2020-12-16 LAB — MAGNESIUM: Magnesium: 1.8 mg/dL (ref 1.7–2.4)

## 2020-12-16 LAB — COOXEMETRY PANEL
Carboxyhemoglobin: 1.1 % (ref 0.5–1.5)
Methemoglobin: 0.7 % (ref 0.0–1.5)
O2 Saturation: 66.3 %
Total hemoglobin: 9.6 g/dL — ABNORMAL LOW (ref 12.0–16.0)

## 2020-12-16 LAB — CBC
HCT: 25.4 % — ABNORMAL LOW (ref 39.0–52.0)
Hemoglobin: 8.2 g/dL — ABNORMAL LOW (ref 13.0–17.0)
MCH: 23.6 pg — ABNORMAL LOW (ref 26.0–34.0)
MCHC: 32.3 g/dL (ref 30.0–36.0)
MCV: 73.2 fL — ABNORMAL LOW (ref 80.0–100.0)
Platelets: 177 10*3/uL (ref 150–400)
RBC: 3.47 MIL/uL — ABNORMAL LOW (ref 4.22–5.81)
RDW: 19.9 % — ABNORMAL HIGH (ref 11.5–15.5)
WBC: 8.5 10*3/uL (ref 4.0–10.5)
nRBC: 0 % (ref 0.0–0.2)

## 2020-12-16 LAB — GLUCOSE, CAPILLARY
Glucose-Capillary: 83 mg/dL (ref 70–99)
Glucose-Capillary: 106 mg/dL — ABNORMAL HIGH (ref 70–99)
Glucose-Capillary: 158 mg/dL — ABNORMAL HIGH (ref 70–99)
Glucose-Capillary: 120 mg/dL — ABNORMAL HIGH (ref 70–99)

## 2020-12-16 LAB — RENAL FUNCTION PANEL
Albumin: 2.6 g/dL — ABNORMAL LOW (ref 3.5–5.0)
Anion gap: 7 (ref 5–15)
BUN: 39 mg/dL — ABNORMAL HIGH (ref 8–23)
CO2: 31 mmol/L (ref 22–32)
Calcium: 9 mg/dL (ref 8.9–10.3)
Chloride: 101 mmol/L (ref 98–111)
Creatinine, Ser: 3.06 mg/dL — ABNORMAL HIGH (ref 0.61–1.24)
GFR, Estimated: 22 mL/min — ABNORMAL LOW (ref >60)
Glucose, Bld: 87 mg/dL (ref 70–99)
Phosphorus: 5 mg/dL — ABNORMAL HIGH (ref 2.5–4.6)
Potassium: 3.7 mmol/L (ref 3.5–5.1)
Sodium: 139 mmol/L (ref 135–145)

## 2020-12-16 LAB — HEPARIN LEVEL (UNFRACTIONATED): Heparin Unfractionated: 0.35 IU/mL (ref 0.30–0.70)

## 2020-12-16 SURGERY — RIGHT HEART CATH
Anesthesia: LOCAL

## 2020-12-16 MED ORDER — HEPARIN (PORCINE) IN NACL 1000-0.9 UT/500ML-% IV SOLN
INTRAVENOUS | Status: DC | PRN
  Administered 2020-12-16: 500 mL

## 2020-12-16 MED ORDER — SODIUM CHLORIDE 0.9 % IV SOLN
510.0000 mg | Freq: Once | INTRAVENOUS | Status: AC
  Administered 2020-12-16: 510 mg via INTRAVENOUS
  Filled 2020-12-16: qty 17

## 2020-12-16 MED ORDER — SODIUM CHLORIDE 0.9 % IV SOLN: INTRAVENOUS | Status: DC

## 2020-12-16 MED ORDER — MAGNESIUM SULFATE 2 GM/50ML IV SOLN
2.0000 g | Freq: Once | INTRAVENOUS | Status: AC
  Administered 2020-12-16: 2 g via INTRAVENOUS
  Filled 2020-12-16: qty 50

## 2020-12-16 MED ORDER — HEPARIN (PORCINE) IN NACL 1000-0.9 UT/500ML-% IV SOLN
INTRAVENOUS | Status: AC
  Filled 2020-12-16: qty 500

## 2020-12-16 MED ORDER — SODIUM CHLORIDE 0.9% FLUSH
3.0000 mL | Freq: Two times a day (BID) | INTRAVENOUS | Status: DC
  Administered 2020-12-16: 3 mL via INTRAVENOUS

## 2020-12-16 MED ORDER — SODIUM CHLORIDE 0.9% FLUSH: 3.0000 mL | INTRAVENOUS | Status: DC | PRN

## 2020-12-16 MED ORDER — HYDRALAZINE HCL 20 MG/ML IJ SOLN: 10.0000 mg | INTRAMUSCULAR | Status: AC | PRN

## 2020-12-16 MED ORDER — SODIUM CHLORIDE 0.9 % IV SOLN: 250.0000 mL | INTRAVENOUS | Status: DC | PRN

## 2020-12-16 MED ORDER — ONDANSETRON HCL 4 MG/2ML IJ SOLN: 4.0000 mg | Freq: Four times a day (QID) | INTRAMUSCULAR | Status: DC | PRN

## 2020-12-16 MED ORDER — HEPARIN (PORCINE) 25000 UT/250ML-% IV SOLN
1650.0000 [IU]/h | INTRAVENOUS | Status: DC
  Administered 2020-12-16: 1300 [IU]/h via INTRAVENOUS
  Administered 2020-12-17 – 2020-12-18 (×2): 1650 [IU]/h via INTRAVENOUS
  Filled 2020-12-16 (×3): qty 250

## 2020-12-16 MED ORDER — LIDOCAINE HCL (PF) 1 % IJ SOLN
INTRAMUSCULAR | Status: DC | PRN
  Administered 2020-12-16: 2 mL

## 2020-12-16 MED ORDER — ASPIRIN 81 MG PO CHEW
81.0000 mg | CHEWABLE_TABLET | Freq: Once | ORAL | Status: AC
  Administered 2020-12-16: 81 mg via ORAL
  Filled 2020-12-16: qty 1

## 2020-12-16 MED ORDER — TORSEMIDE 20 MG PO TABS
40.0000 mg | ORAL_TABLET | Freq: Every day | ORAL | Status: DC
  Administered 2020-12-16 – 2020-12-18 (×3): 40 mg via ORAL
  Filled 2020-12-16 (×3): qty 2

## 2020-12-16 MED ORDER — ACETAMINOPHEN 325 MG PO TABS
650.0000 mg | ORAL_TABLET | ORAL | Status: DC | PRN
  Administered 2020-12-16: 650 mg via ORAL
  Filled 2020-12-16: qty 2

## 2020-12-16 MED ORDER — LABETALOL HCL 5 MG/ML IV SOLN: 10.0000 mg | INTRAVENOUS | Status: AC | PRN

## 2020-12-16 MED ORDER — POTASSIUM CHLORIDE CRYS ER 20 MEQ PO TBCR
40.0000 meq | EXTENDED_RELEASE_TABLET | Freq: Once | ORAL | Status: AC
  Administered 2020-12-16: 40 meq via ORAL
  Filled 2020-12-16: qty 2

## 2020-12-16 MED ORDER — ASPIRIN 81 MG PO CHEW: 81.0000 mg | CHEWABLE_TABLET | ORAL | Status: DC

## 2020-12-16 MED ORDER — LIDOCAINE HCL (PF) 1 % IJ SOLN
INTRAMUSCULAR | Status: AC
  Filled 2020-12-16: qty 30

## 2020-12-16 SURGICAL SUPPLY — 8 items
CATH SWAN GANZ 7F STRAIGHT (CATHETERS) ×1 IMPLANT
GLIDESHEATH SLENDER 7FR .021G (SHEATH) ×1 IMPLANT
KIT HEART LEFT (KITS) ×2 IMPLANT
MAT PREVALON FULL STRYKER (MISCELLANEOUS) ×1 IMPLANT
PACK CARDIAC CATHETERIZATION (CUSTOM PROCEDURE TRAY) ×2 IMPLANT
TRANSDUCER W/STOPCOCK (MISCELLANEOUS) ×2 IMPLANT
TUBING ART PRESS 72  MALE/FEM (TUBING) ×1
TUBING ART PRESS 72 MALE/FEM (TUBING) IMPLANT

## 2020-12-16 NOTE — Progress Notes (Signed)
   Staff RN reported bladder retention.   Bladder scan > 500 cc urine.   Order placed for I&O cath as needed.   Aida Lemaire NP-C  4:23 PM

## 2020-12-16 NOTE — Progress Notes (Addendum)
Physical Therapy Treatment Patient Details Name: Kevin [name] MRN: [id number] DOB: [birth year] Today's Date: 12/16/2020    History of Present Illness 65 y.o. admitted 5/9 with Afib and volume overload with acute on chronic CHF. CRRT started 5/12. 5/16 TEE with DCCV. PMhx: combined CHF, NICM, non-obstructive CAD, paroxysmal atrial fibrillation, moderate mitral regurgitation, HTN, DM type 2, COPD, tobacco abuse    PT Comments    Pt moving well with excellent progression in gait able to walk in hall without AD with good speed. Pt with aflutter this session awaiting cath today. Pt encouraged to continue mobility with nursing staff and no SOB, dizziness or CP throughout session.     Follow Up Recommendations  No PT follow up     Equipment Recommendations  None recommended by PT    Recommendations for Other Services       Precautions / Restrictions Precautions Precautions: Fall;Other (comment) Precaution Comments: watch HR with Afib Restrictions Weight Bearing Restrictions: No    Mobility  Bed Mobility Overal bed mobility: Modified Independent Bed Mobility: Supine to Sit     Supine to sit: Modified independent (Device/Increase time);HOB elevated     General bed mobility comments: HOB 15 degrees with pt able to pivot to left side of bed without assist    Transfers Overall transfer level: Modified independent Equipment used: None Transfers: Sit to/from Stand            Ambulation/Gait Ambulation/Gait assistance: Supervision Gait Distance (Feet): 350 Feet Assistive device: None Gait Pattern/deviations: Step-through pattern;Decreased stride length   Gait velocity interpretation: 1.31 - 2.62 ft/sec, indicative of limited community ambulator General Gait Details: pt with one standing rest with good stabiltiy and speed. Aflutter throughout gait with HR 120-140. Return to 110 at rest after gait   Stairs             Wheelchair Mobility    Modified Rankin  (Stroke Patients Only)       Balance Overall balance assessment: No apparent balance deficits (not formally assessed)                                          Cognition Arousal/Alertness: Awake/alert Behavior During Therapy: WFL for tasks assessed/performed Overall Cognitive Status: Within Functional Limits for tasks assessed                                        Exercises      General Comments General comments (skin integrity, edema, etc.): BP 135/82 after activity, HR up to 113bpm, SpO2 93% and above on RA      Pertinent Vitals/Pain Pain Assessment: No/denies pain    Home Living Family/patient expects to be discharged to:: Other (Comment) (sober living home) Living Arrangements: Other (Comment) (8 other males in sober living home) Available Help at Discharge: Friend(s);Available PRN/intermittently Type of Home: House Home Access: Stairs to enter   Home Layout: One level;Laundry or work area in [employer]: None Additional Comments: pt is living in a recovery home and reports other members can assist    Prior Function Level of Independence: Independent      Comments: no use of AD for mobility. Independent with IADLs in the home   PT Goals (current goals can now be found in the care plan section) Acute Rehab PT  Goals Patient Stated Goal: return home Progress towards PT goals: Progressing toward goals    Frequency    Min 3X/week      PT Plan Current plan remains appropriate    Co-evaluation              AM-PAC PT "6 Clicks" Mobility   Outcome Measure  Help needed turning from your back to your side while in a flat bed without using bedrails?: None Help needed moving from lying on your back to sitting on the side of a flat bed without using bedrails?: None Help needed moving to and from a bed to a chair (including a wheelchair)?: None Help needed standing up from a chair using your arms (e.g., wheelchair  or bedside chair)?: A Little Help needed to walk in hospital room?: A Little Help needed climbing 3-5 steps with a railing? : A Little 6 Click Score: 21    End of Session   Activity Tolerance: Patient tolerated treatment well Patient left: in chair;with call bell/phone within reach;with nursing/sitter in room Nurse Communication: Mobility status PT Visit Diagnosis: Other abnormalities of gait and mobility (R26.89)     Time: 8891-6945 PT Time Calculation (min) (ACUTE ONLY): 15 min  Charges:  $Gait Training: 8-22 mins                     [place] P, PT Acute Rehabilitation Services Pager: [phone] Office: [phone]    Enedina Finner Raiya Stainback 12/16/2020, 11:36 AM

## 2020-12-16 NOTE — H&P (View-Only) (Signed)
Patient ID: Kevin [name], male   DOB: 01/10/1956, 65 y.o.   MRN: [id number]     Advanced Heart Failure Rounding Note  PCP-Cardiologist: Chrystie Nose, MD   Subjective:    - CVVH started 5/12 with intractable volume overload.   - TEE/DCCV>>NSR 5/16 - CVVH stopped 5/20 - On amiodarone 30 mg/hr and heparin gtt.     Creatinine higher at 3.06 ,> 2 liters urine output.   Co-ox 66% , no pressors/inotropes.    Feels ok. Denies SOB.   TEE: Moderate LV dilation with EF 25%, moderately decreased RV function, moderate functional MR, mod-severe TR.    Objective:   Weight Range: 108.2 kg Body mass index is 31.47 kg/m.   Vital Signs:   Temp:  [97.6 F (36.4 C)-99.1 F (37.3 C)] 98.2 F (36.8 C) (05/23 0655) Pulse Rate:  [31-99] 94 (05/23 0600) Resp:  [14-26] 15 (05/23 0600) BP: (86-135)/(57-93) 104/74 (05/23 0600) SpO2:  [92 %-100 %] 98 % (05/23 0600) Weight:  [108.2 kg] 108.2 kg (05/23 0500) Last BM Date: 12/15/20  Weight change: Filed Weights   12/14/20 0411 12/15/20 0500 12/16/20 0500  Weight: 111.1 kg 110.8 kg 108.2 kg    Intake/Output:   Intake/Output Summary (Last 24 hours) at 12/16/2020 0706 Last data filed at 12/16/2020 0600 Gross per 24 hour  Intake 1465.95 ml  Output 2050 ml  Net -584.05 ml      Physical Exam  CVP 10-11 General:  No resp difficulty HEENT: normal Neck: supple. 9-10 . Carotids 2+ bilat; no bruits. No lymphadenopathy or thryomegaly appreciated. LIJ  Cor: PMI nondisplaced. Regular rate & rhythm. No rubs, gallops or murmurs. Lungs: clear Abdomen: soft, nontender, nondistended. No hepatosplenomegaly. No bruits or masses. Good bowel sounds. Extremities: no cyanosis, clubbing, rash, R and LLE unna boots.  Neuro: alert & orientedx3, cranial nerves grossly intact. moves all 4 extremities w/o difficulty. Affect pleasant   Telemetry   NSR 70s with PACs  Labs    CBC Recent Labs    12/15/20 0324 12/16/20 0454  WBC 11.0* 8.5  HGB 8.1* 8.2*   HCT 25.5* 25.4*  MCV 73.7* 73.2*  PLT 166 177   Basic Metabolic Panel Recent Labs    41/63/84 0323 12/15/20 0324 12/16/20 0454  NA 134*  --  139  K 3.6  --  3.7  CL 96*  --  101  CO2 30  --  31  GLUCOSE 170*  --  87  BUN 33*  --  39*  CREATININE 2.57*  --  3.06*  CALCIUM 8.9  --  9.0  MG  --  1.7 1.8  PHOS 4.9*  --  5.0*   Liver Function Tests Recent Labs    12/15/20 0323 12/16/20 0454  ALBUMIN 2.6* 2.6*   No results for input(s): LIPASE, AMYLASE in the last 72 hours. Cardiac Enzymes No results for input(s): CKTOTAL, CKMB, CKMBINDEX, TROPONINI in the last 72 hours.  BNP: BNP (last 3 results) Recent Labs    12/02/20 0915  BNP 1,158.5*    ProBNP (last 3 results) No results for input(s): PROBNP in the last 8760 hours.   D-Dimer No results for input(s): DDIMER in the last 72 hours. Hemoglobin A1C No results for input(s): HGBA1C in the last 72 hours. Fasting Lipid Panel No results for input(s): CHOL, HDL, LDLCALC, TRIG, CHOLHDL, LDLDIRECT in the last 72 hours. Thyroid Function Tests No results for input(s): TSH, T4TOTAL, T3FREE, THYROIDAB in the last 72 hours.  Invalid input(s): FREET3  Other results:   Imaging    No results found.   Medications:     Scheduled Medications: . (feeding supplement) PROSource Plus  30 mL Oral BID BM  . atorvastatin  10 mg Oral Daily  . B-complex with vitamin C  1 tablet Oral Daily  . Chlorhexidine Gluconate Cloth  6 each Topical Daily  . feeding supplement  237 mL Oral BID BM  . insulin aspart  0-5 Units Subcutaneous QHS  . insulin aspart  0-9 Units Subcutaneous TID WC  . loratadine  10 mg Oral Daily  . midodrine  10 mg Oral TID WC  . montelukast  10 mg Oral QHS  . pantoprazole  40 mg Oral Daily  . senna-docusate  1 tablet Oral BID  . sodium chloride flush  10-40 mL Intracatheter Q12H  . sodium chloride flush  3 mL Intravenous Q12H  . sodium chloride flush  3 mL Intravenous Q12H  . torsemide  40 mg Oral  Daily    Infusions: . sodium chloride    . sodium chloride    . amiodarone 30 mg/hr (12/16/20 0600)  . ferumoxytol    . heparin 1,300 Units/hr (12/16/20 0600)    PRN Medications: sodium chloride, Place/Maintain arterial line **AND** sodium chloride, acetaminophen, ondansetron (ZOFRAN) IV, sodium chloride flush, sodium chloride flush    Assessment/Plan   1. Acute on chronic systolic CHF: Nonischemic cardiomyopathy.  Echo this admission with EF 25-30%, moderate LV dilation, mild LVH, moderate RVE with moderately decreased RV systolic function, severe biatrial enlargement, moderate-severe TR, IVC dilated. EF was low starting in 2016, cath at that time showed mild nonobstructive CAD.  Patient had runs of AF but not sustained when initial diagnosis was made.  Was readmitted in 2018 with persistent AF and CHF, echo with EF 20-25%.  Had DCCV back to NSR at that time.  EF up to 55-60% in 2019.  Lost to followup, now back with progressive dyspnea/volume overload and EF back down in setting of AF with RVR.  Difficult to identify initial etiology of CMP but cannot rule out tachy-mediated.  UDS negative for cocaine. 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  He does not feel AF and we are not sure how long he has been in it.  He was 30+ lbs up with marked volume overload and concern for low output HF (progressive rise in creatinine with attempts at diuresis).  Poor response to initiation of milrinone 0.25 then norepinephrine, creatinine continued to rise and CVVH started 5/12. CVVH stopped 5/20 with good UOP on IV Lasix. NSR this morning with PACs.  Now off milrinone and NE, CO-OX 66%.   -CVP 10-11. Continue Torsemide 40 mg po daily and adjust post caht.  - RHC today.  Hold off on coronary angiography with renal dysfunction.   - Want to maintain NSR, continue amiodarone.  - Consider eventual cardiac MRI.  - Unna boots.  - BP stable, decrease midodrine to 10 mg tid.   - BP soft no  room for bb with low output.  2. Atrial fibrillation: Admitted with afib/RVR, uncertain how long this has been present.  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 S/p TEE/DCCV 5/16. Has been in and out of atrial fibrillation, currently in NSR with PACs.  - Continue amiodarone gtt at 30 mg/hr for now.  - QTc ok on last ECG, if creatinine stabilizes may be able to use ranolazine as adjunct to help maintain NSR.   - On heparin drip.  3. AKI:  Uncertain baseline, creatinine 1.77 at arrival, creatinine up to 3.49 with poor diuresis and CVVH started with intractable volume overload. Now off CVVH.  Creatinine 3.06   - Follow creatinine closely, hopefully will stabilize soon.   4. Hematuria: Seen by urology => think old blood in urine, renal ultrasound ok.  Recommend continuing heparin gtt.  Has foley. No further gross hematuria  5. Iron-deficieny anemia: Maroon stool has been seen. Hgb down to 8.2 today.  - received feraheme  - Follow closely, transfuse hgb < 8.   6. Deconditioning: Consult PT. OOB.    RHC today   Length of Stay: 14  Amy Clegg, NP  12/16/2020, 7:06 AM  Advanced Heart Failure Team Pager [phone] (M-F; 7a - 5p)  Please contact [facility] for night-coverage after hours (5p -7a ) and weekends on [email or web address]  Patient seen with NP, agree with the above note.   Overnight, it appears that the patient went into rate-controlled atrial flutter.  He is currently in atrial flutter on amiodarone 30 mg/hr.    Co-ox 66% off inotropes.   CVP 10-11, good diuresis yesterday with torsemide 40 mg daily but BUN/creatinine continue rise slowly.  Nephrology is following.   BP stable on midodrine 10 mg tid.   General: NAD Neck: JVP 8-9 cm, no thyromegaly or thyroid nodule.  Lungs: Clear to auscultation bilaterally with normal respiratory effort. CV: Nondisplaced PMI.  Heart irregular S1/S2, no S3/S4, no murmur.  Trace ankle edema. Abdomen: Soft, nontender, no hepatosplenomegaly, no  distention.  Skin: Intact without lesions or rashes.  Neurologic: Alert and oriented x 3.  Psych: Normal affect. Extremities: No clubbing or cyanosis.  HEENT: Normal.   Patient went into atrial flutter overnight.  Continue heparin gtt and increase amiodarone to 60 mg/hr.  If he does not convert to NSR, plan DCCV.  As above, may consider addition of ranolazine but would like to see renal function settle first.   Mild volume overload with CVP 10-11, but weight down considerably and good UOP with torsemide 40 mg daily.  Creatinine/BUN rising slowly.  - Continue current torsemide.  - Renal following, hopefully can avoid HD but renal function needs to settle.  - Continue midodrine at current level, BP stable.   Co-ox good off inotropes.   Plan for RHC today to assess filling pressures and cardiac output formally. Risks/benefits discussed with patient and he agrees.   CRITICAL CARE Performed by: Marca Ancona  Total critical care time: 35 minutes  Critical care time was exclusive of separately billable procedures and treating other patients.  Critical care was necessary to treat or prevent imminent or life-threatening deterioration.  Critical care was time spent personally by me on the following activities: development of treatment plan with patient and/or surrogate as well as nursing, discussions with consultants, evaluation of patient's response to treatment, examination of patient, obtaining history from patient or surrogate, ordering and performing treatments and interventions, ordering and review of laboratory studies, ordering and review of radiographic studies, pulse oximetry and re-evaluation of patient's condition.  Marca Ancona 12/16/2020 9:24 AM

## 2020-12-16 NOTE — Progress Notes (Addendum)
Patient ID: Zandon Talton, male   DOB: 01/10/1956, 65 y.o.   MRN: [id number]     Advanced Heart Failure Rounding Note  PCP-Cardiologist: Chrystie Nose, MD   Subjective:    - CVVH started 5/12 with intractable volume overload.   - TEE/DCCV>>NSR 5/16 - CVVH stopped 5/20 - On amiodarone 30 mg/hr and heparin gtt.     Creatinine higher at 3.06 ,> 2 liters urine output.   Co-ox 66% , no pressors/inotropes.    Feels ok. Denies SOB.   TEE: Moderate LV dilation with EF 25%, moderately decreased RV function, moderate functional MR, mod-severe TR.    Objective:   Weight Range: 108.2 kg Body mass index is 31.47 kg/m.   Vital Signs:   Temp:  [97.6 F (36.4 C)-99.1 F (37.3 C)] 98.2 F (36.8 C) (05/23 0655) Pulse Rate:  [31-99] 94 (05/23 0600) Resp:  [14-26] 15 (05/23 0600) BP: (86-135)/(57-93) 104/74 (05/23 0600) SpO2:  [92 %-100 %] 98 % (05/23 0600) Weight:  [108.2 kg] 108.2 kg (05/23 0500) Last BM Date: 12/15/20  Weight change: Filed Weights   12/14/20 0411 12/15/20 0500 12/16/20 0500  Weight: 111.1 kg 110.8 kg 108.2 kg    Intake/Output:   Intake/Output Summary (Last 24 hours) at 12/16/2020 0706 Last data filed at 12/16/2020 0600 Gross per 24 hour  Intake 1465.95 ml  Output 2050 ml  Net -584.05 ml      Physical Exam  CVP 10-11 General:  No resp difficulty HEENT: normal Neck: supple. 9-10 . Carotids 2+ bilat; no bruits. No lymphadenopathy or thryomegaly appreciated. [facility]  Cor: PMI nondisplaced. Regular rate & rhythm. No rubs, gallops or murmurs. Lungs: clear Abdomen: soft, nontender, nondistended. No hepatosplenomegaly. No bruits or masses. Good bowel sounds. Extremities: no cyanosis, clubbing, rash, R and LLE unna boots.  Neuro: alert & orientedx3, cranial nerves grossly intact. moves all 4 extremities w/o difficulty. Affect pleasant   Telemetry   NSR 70s with PACs  Labs    CBC Recent Labs    12/15/20 0324 12/16/20 0454  WBC 11.0* 8.5  HGB 8.1* 8.2*   HCT 25.5* 25.4*  MCV 73.7* 73.2*  PLT 166 177   Basic Metabolic Panel Recent Labs    41/63/84 0323 12/15/20 0324 12/16/20 0454  NA 134*  --  139  K 3.6  --  3.7  CL 96*  --  101  CO2 30  --  31  GLUCOSE 170*  --  87  BUN 33*  --  39*  CREATININE 2.57*  --  3.06*  CALCIUM 8.9  --  9.0  MG  --  1.7 1.8  PHOS 4.9*  --  5.0*   Liver Function Tests Recent Labs    12/15/20 0323 12/16/20 0454  ALBUMIN 2.6* 2.6*   No results for input(s): LIPASE, AMYLASE in the last 72 hours. Cardiac Enzymes No results for input(s): CKTOTAL, CKMB, CKMBINDEX, TROPONINI in the last 72 hours.  BNP: BNP (last 3 results) Recent Labs    12/02/20 0915  BNP 1,158.5*    ProBNP (last 3 results) No results for input(s): PROBNP in the last 8760 hours.   D-Dimer No results for input(s): DDIMER in the last 72 hours. Hemoglobin A1C No results for input(s): HGBA1C in the last 72 hours. Fasting Lipid Panel No results for input(s): CHOL, HDL, LDLCALC, TRIG, CHOLHDL, LDLDIRECT in the last 72 hours. Thyroid Function Tests No results for input(s): TSH, T4TOTAL, T3FREE, THYROIDAB in the last 72 hours.  Invalid input(s): FREET3  Other results:   Imaging    No results found.   Medications:     Scheduled Medications: . (feeding supplement) PROSource Plus  30 mL Oral BID BM  . atorvastatin  10 mg Oral Daily  . B-complex with vitamin C  1 tablet Oral Daily  . Chlorhexidine Gluconate Cloth  6 each Topical Daily  . feeding supplement  237 mL Oral BID BM  . insulin aspart  0-5 Units Subcutaneous QHS  . insulin aspart  0-9 Units Subcutaneous TID WC  . loratadine  10 mg Oral Daily  . midodrine  10 mg Oral TID WC  . montelukast  10 mg Oral QHS  . pantoprazole  40 mg Oral Daily  . senna-docusate  1 tablet Oral BID  . sodium chloride flush  10-40 mL Intracatheter Q12H  . sodium chloride flush  3 mL Intravenous Q12H  . sodium chloride flush  3 mL Intravenous Q12H  . torsemide  40 mg Oral  Daily    Infusions: . sodium chloride    . sodium chloride    . amiodarone 30 mg/hr (12/16/20 0600)  . ferumoxytol    . heparin 1,300 Units/hr (12/16/20 0600)    PRN Medications: sodium chloride, Place/Maintain arterial line **AND** sodium chloride, acetaminophen, ondansetron (ZOFRAN) IV, sodium chloride flush, sodium chloride flush    Assessment/Plan   1. Acute on chronic systolic CHF: Nonischemic cardiomyopathy.  Echo this admission with EF 25-30%, moderate LV dilation, mild LVH, moderate RVE with moderately decreased RV systolic function, severe biatrial enlargement, moderate-severe TR, IVC dilated. EF was low starting in 2016, cath at that time showed mild nonobstructive CAD.  Patient had runs of AF but not sustained when initial diagnosis was made.  Was readmitted in 2018 with persistent AF and CHF, echo with EF 20-25%.  Had DCCV back to NSR at that time.  EF up to 55-60% in 2019.  Lost to followup, now back with progressive dyspnea/volume overload and EF back down in setting of AF with RVR.  Difficult to identify initial etiology of CMP but cannot rule out tachy-mediated.  UDS negative for cocaine. 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  He does not feel AF and we are not sure how long he has been in it.  He was 30+ lbs up with marked volume overload and concern for low output HF (progressive rise in creatinine with attempts at diuresis).  Poor response to initiation of milrinone 0.25 then norepinephrine, creatinine continued to rise and CVVH started 5/12. CVVH stopped 5/20 with good UOP on IV Lasix. NSR this morning with PACs.  Now off milrinone and NE, CO-OX 66%.   -CVP 10-11. Continue Torsemide 40 mg po daily and adjust post caht.  - RHC today.  Hold off on coronary angiography with renal dysfunction.   - Want to maintain NSR, continue amiodarone.  - Consider eventual cardiac MRI.  - Unna boots.  - BP stable, decrease midodrine to 10 mg tid.   - BP soft no  room for bb with low output.  2. Atrial fibrillation: Admitted with afib/RVR, uncertain how long this has been present.  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 S/p TEE/DCCV 5/16. Has been in and out of atrial fibrillation, currently in NSR with PACs.  - Continue amiodarone gtt at 30 mg/hr for now.  - QTc ok on last ECG, if creatinine stabilizes may be able to use ranolazine as adjunct to help maintain NSR.   - On heparin drip.  3. AKI:  Uncertain baseline, creatinine 1.77 at arrival, creatinine up to 3.49 with poor diuresis and CVVH started with intractable volume overload. Now off CVVH.  Creatinine 3.06   - Follow creatinine closely, hopefully will stabilize soon.   4. Hematuria: Seen by urology => think old blood in urine, renal ultrasound ok.  Recommend continuing heparin gtt.  Has foley. No further gross hematuria  5. Iron-deficieny anemia: Maroon stool has been seen. Hgb down to 8.2 today.  - received feraheme  - Follow closely, transfuse hgb < 8.   6. Deconditioning: Consult PT. OOB.    RHC today   Length of Stay: 14  Amy Clegg, NP  12/16/2020, 7:06 AM  Advanced Heart Failure Team Pager [phone] (M-F; 7a - 5p)  Please contact [facility] for night-coverage after hours (5p -7a ) and weekends on [email or web address]  Patient seen with NP, agree with the above note.   Overnight, it appears that the patient went into rate-controlled atrial flutter.  He is currently in atrial flutter on amiodarone 30 mg/hr.    Co-ox 66% off inotropes.   CVP 10-11, good diuresis yesterday with torsemide 40 mg daily but BUN/creatinine continue rise slowly.  Nephrology is following.   BP stable on midodrine 10 mg tid.   General: NAD Neck: JVP 8-9 cm, no thyromegaly or thyroid nodule.  Lungs: Clear to auscultation bilaterally with normal respiratory effort. CV: Nondisplaced PMI.  Heart irregular S1/S2, no S3/S4, no murmur.  Trace ankle edema. Abdomen: Soft, nontender, no hepatosplenomegaly, no  distention.  Skin: Intact without lesions or rashes.  Neurologic: Alert and oriented x 3.  Psych: Normal affect. Extremities: No clubbing or cyanosis.  HEENT: Normal.   Patient went into atrial flutter overnight.  Continue heparin gtt and increase amiodarone to 60 mg/hr.  If he does not convert to NSR, plan DCCV.  As above, may consider addition of ranolazine but would like to see renal function settle first.   Mild volume overload with CVP 10-11, but weight down considerably and good UOP with torsemide 40 mg daily.  Creatinine/BUN rising slowly.  - Continue current torsemide.  - Renal following, hopefully can avoid HD but renal function needs to settle.  - Continue midodrine at current level, BP stable.   Co-ox good off inotropes.   Plan for RHC today to assess filling pressures and cardiac output formally. Risks/benefits discussed with patient and he agrees.   CRITICAL CARE Performed by: Marca Ancona  Total critical care time: 35 minutes  Critical care time was exclusive of separately billable procedures and treating other patients.  Critical care was necessary to treat or prevent imminent or life-threatening deterioration.  Critical care was time spent personally by me on the following activities: development of treatment plan with patient and/or surrogate as well as nursing, discussions with consultants, evaluation of patient's response to treatment, examination of patient, obtaining history from patient or surrogate, ordering and performing treatments and interventions, ordering and review of laboratory studies, ordering and review of radiographic studies, pulse oximetry and re-evaluation of patient's condition.  Marca Ancona 12/16/2020 9:24 AM

## 2020-12-16 NOTE — Progress Notes (Signed)
ANTICOAGULATION CONSULT NOTE - Follow Up Consult  Pharmacy Consult for heparin Indication: atrial fibrillation  Labs: Recent Labs    12/14/20 0437 12/14/20 0437 12/14/20 0845 12/14/20 1600 12/15/20 0323 12/15/20 0324 12/16/20 0454  HGB  --    < > 8.5*  --   --  8.1* 8.2*  HCT  --   --  26.2*  --   --  25.5* 25.4*  PLT  --   --  144*  --   --  166 177  HEPARINUNFRC 0.55  --   --   --   --  0.63 0.35  CREATININE 2.06*  --   --  2.39* 2.57*  --  3.06*   < > = values in this interval not displayed.    Assessment/Plan:  65 y/o male on heparin for Afib while holding home Eliquis. Pt s/p DCCV 5/16. Patient continues to have intermittently irregular rhythm and is now off of CRRT  Heparin level at goal this AM (0.35, therapeutic).   Patient now s/p RHC this am, orders to resume heparin this evening.   Goal of Therapy:  Heparin level 0.3-0.7 units/ml Monitor platelets by anticoagulation protocol: Yes  Plan: Restart heparin at rate of 1300 units / hour Daily heparin level and CBC F/u s/sx bleeding  F/u switch back to PTA Eliquis  Sheppard Coil PharmD., BCPS Clinical Pharmacist 12/16/2020 12:45 PM

## 2020-12-16 NOTE — Progress Notes (Signed)
Nephrology Progress Note:    Subjective:  had 1.4 liters UOP over 5/22.  He was started on torsemide on 5/22 per charting.  Feels ok this am.    Review of systems:  Denies shortness of breath or chest pain  Denies n/v Not on oxygen at home    Objective Vital signs in last 24 hours: Vitals:   12/16/20 0200 12/16/20 0300 12/16/20 0400 12/16/20 0500  BP: 125/68 107/75 96/65 103/86  Pulse: (!) 50 95 99 80  Resp: 19 15 15 14   Temp:  97.6 F (36.4 C)    TempSrc:  Oral    SpO2: 100% 99% 93% 98%  Weight:      Height:       Weight change:   Intake/Output Summary (Last 24 hours) at 12/16/2020 0529 Last data filed at 12/16/2020 0500 Gross per 24 hour  Intake 1561.39 ml  Output 1550 ml  Net 11.39 ml    Labs: Basic Metabolic Panel: Recent Labs  Lab 12/14/20 0437 12/14/20 1600 12/15/20 0323  NA 134* 136 134*  K 3.6 3.8 3.6  CL 99 97* 96*  CO2 28 31 30   GLUCOSE 124* 163* 170*  BUN 30* 33* 33*  CREATININE 2.06* 2.39* 2.57*  CALCIUM 8.8* 8.9 8.9  PHOS 3.9 4.5 4.9*   Liver Function Tests: Recent Labs  Lab 12/14/20 0437 12/14/20 1600 12/15/20 0323  ALBUMIN 2.6* 2.7* 2.6*   CBC: Recent Labs  Lab 12/12/20 1316 12/13/20 0433 12/14/20 0845 12/15/20 0324 12/16/20 0454  WBC 8.7 8.5 9.1 11.0* 8.5  HGB 9.1* 8.5* 8.5* 8.1* 8.2*  HCT 29.1* 26.8* 26.2* 25.5* 25.4*  MCV 74.0* 73.8* 73.4* 73.7* 73.2*  PLT 137* 143* 144* 166 177   Cardiac Enzymes: No results for input(s): CKTOTAL, CKMB, CKMBINDEX, TROPONINI in the last 168 hours. CBG: Recent Labs  Lab 12/14/20 2114 12/15/20 0643 12/15/20 1112 12/15/20 1558 12/15/20 2138  GLUCAP 103* 94 131* 107* 104*    Iron Studies:  No results for input(s): IRON, TIBC, TRANSFERRIN, FERRITIN in the last 72 hours. Studies/Results: No results found. Medications: Infusions: . sodium chloride    . sodium chloride    . amiodarone 30 mg/hr (12/16/20 0500)  . ferumoxytol    . heparin Stopped (12/16/20 0459)    Scheduled  Medications: . (feeding supplement) PROSource Plus  30 mL Oral BID BM  . atorvastatin  10 mg Oral Daily  . B-complex with vitamin C  1 tablet Oral Daily  . Chlorhexidine Gluconate Cloth  6 each Topical Daily  . feeding supplement  237 mL Oral BID BM  . insulin aspart  0-5 Units Subcutaneous QHS  . insulin aspart  0-9 Units Subcutaneous TID WC  . loratadine  10 mg Oral Daily  . midodrine  10 mg Oral TID WC  . montelukast  10 mg Oral QHS  . pantoprazole  40 mg Oral Daily  . senna-docusate  1 tablet Oral BID  . sodium chloride flush  10-40 mL Intracatheter Q12H  . sodium chloride flush  3 mL Intravenous Q12H  . sodium chloride flush  3 mL Intravenous Q12H  . torsemide  40 mg Oral Daily    have reviewed scheduled and prn medications.  Physical Exam:  General adult male in bed in no acute distress HEENT normocephalic atraumatic extraocular movements intact sclera anicteric Neck supple trachea midline Lungs clear to auscultation bilaterally normal work of breathing at rest  Heart S1S2 no rub Abdomen soft nontender nondistended/obese habitus Extremities no lower extremity  edema; legs wrapped; LUE edema Psych normal mood and affect Neuro alert & oriented and answers questions gu - purewick  Access left IJ nontunneled catheter    Assessment/ Plan: Pt is a 65 y.o. yo male -  crt 1.11 a year ago, who was admitted on 12/02/2020 with Afib and volume overload.   Initial crt 1.7 but increased in the setting of attempted diuresis and continued hemodyanamic instability  -  CRRT 5/12 - 5/20  1. Cardiomyopathy - TEE with EF of 25%, moderately decreased RV function, mitral and tricuspid regurgitation.  Heart failure following. Note for right heart cath 5/23 per charting  2. AKI on CKD - baseline between 1.1-1.7 (1.7 on admit). Likely cardiorenal syndrome with ATN related to hemodynamic instability. CRRT started given volume overload 5/12-5/20.  Note hx of gross hematuria resolved.  - await labs -  will follow.  Have been uptrending off of CRRT - Dialysis catheter in place - continue for now  3. Fluid volume overload - improved s/p RRT.  Got torsemide 40 mg starting 5/22.   4.  A fib control per primary; on amio  5. Anemia microcytic- some maroon stools this hospitalization per charting.  Iron deficiency s/p feraheme 5/15.  Redose.  Anticipate ESA.   6. HTN  Charted as hx of HTN.  Now on midodrine.    Estanislado Emms, MD 12/16/2020,5:50 AM

## 2020-12-16 NOTE — Progress Notes (Signed)
Orthopedic Tech Progress Note Patient Details:  Boruch Manuele 1955-10-11 [id number]  Ortho Devices Type of Ortho Device: Roland Rack boot Ortho Device/Splint Location: Bilateral Lower Extremity Ortho Device/Splint Interventions: Ordered,Application,Adjustment   Post Interventions Patient Tolerated: Well Instructions Provided: Care of device,Poper ambulation with device   Aleshia Cartelli P Harle Stanford 12/16/2020, 10:50 AM

## 2020-12-16 NOTE — Progress Notes (Signed)
Nutrition Follow-up  DOCUMENTATION CODES:   Not applicable  INTERVENTION:   Continue 30 ml ProSource Plus BID, each supplement provides 100 kcals and 15 grams protein.   D/C Ensure Enlive  Remove carb modified restriction to current diet order. If po intake inadequate on follow-up, recommend further liberalization of diet   NUTRITION DIAGNOSIS:   Increased nutrient needs related to acute illness as evidenced by estimated needs.  Being addressed via supplements  GOAL:   Patient will meet greater than or equal to 90% of their needs  Progressing  MONITOR:   PO intake,Supplement acceptance,Labs,Weight trends  REASON FOR ASSESSMENT:   Rounds    ASSESSMENT:   65 yo male admitted with acute on chronic CHF, AKI requiring RRT. PMH includes COPD, CAD, CHF, HTN, AKI, CKD  5/12 CRRT initiated 2/2 massive volume overload 5/16 TEE, cardioversion 5/20 CRRT discontinued  NPO this AM for right heart cath Off CRRT, further RRT on hold  Pt ate 100% at lunch today, NPO at breakfast. Recorded po intake 75-100% of meals. Appetite good  Weight down to 108.2 kg; admit weight 128.5 kg. Highest wt of 132 kg on 5/11. Net negative 24 L since admission  Noted phosphorus trending up but acceptable; potassium being supplemented CBGs well controlled  Labs: phosphorus 5.0, Creatinine 3.06, CBGs 83-131 Meds: ss novolog, KCl, B complex with C   Diet Order:  RENAL CARB MODIFIED with 1800 mL fluid restriction  EDUCATION NEEDS:   Education needs have been addressed  Skin:  Skin Assessment: Reviewed RN Assessment  Last BM:  5/22  Height:   Ht Readings from Last 1 Encounters:  12/02/20 6\' 1"  (1.854 m)    Weight:   Wt Readings from Last 1 Encounters:  12/16/20 108.2 kg    BMI:  Body mass index is 31.47 kg/m.  Estimated Nutritional Needs:   Kcal:  2100-2300 kcals  Protein:  120-140 g  Fluid:  1.8 L   12/18/20 MS, RDN, LDN, CNSC Registered Dietitian III Clinical  Nutrition RD Pager and On-Call Pager Number Located in [place]

## 2020-12-16 NOTE — Evaluation (Addendum)
Occupational Therapy Evaluation Patient Details Name: Kevin [name] MRN: [id number] DOB: [birth year] Today's Date: 12/16/2020    History of Present Illness 65 y.o. admitted 5/9 with Afib and volume overload with acute on chronic CHF. CRRT started 5/12. 5/16 TEE with DCCV. PMhx: combined CHF, NICM, non-obstructive CAD, paroxysmal atrial fibrillation, moderate mitral regurgitation, HTN, DM type 2, COPD, tobacco abuse   Clinical Impression   PTA, pt lives in sober living home with 8 other individuals. Pt reports Independence with all ADLs, IADLs and mobility without AD. Pt presents now, pleasant and eager to participate. Pt overall Supervision for mobility in room without AD, Independent with UB ADLs and Supervision for LB ADLs. Pt with mild endurance deficits that will likely progress quickly with increased activity. Began education on energy conservation strategies that may be implemented in daily tasks, as well as consideration of shower chair use if endurance deficits remain. Anticipate no skilled OT services needed at DC, but will follow at acute level to progress endurance, reinforce energy conservation strategies and maximize independence with ADLs/mobility.   BP 135/82 after mobility SpO2 >93% on RA HR up to 113bpm with ADLs    Follow Up Recommendations  No OT follow up    Equipment Recommendations  Tub/shower seat (if continues with endurance deficits)    Recommendations for Other Services       Precautions / Restrictions Weight Bearing Restrictions: No      Mobility Bed Mobility Overal bed mobility: Modified Independent Bed Mobility: Supine to Sit     Supine to sit: Modified independent (Device/Increase time);HOB elevated          Transfers Overall transfer level: Needs assistance Equipment used: None Transfers: Sit to/from Stand Sit to Stand: Supervision         General transfer comment: supervision for safety without use of AD    Balance Overall balance  assessment: No apparent balance deficits (not formally assessed)                                         ADL either performed or assessed with clinical judgement   ADL Overall ADL's : Needs assistance/impaired Eating/Feeding: Independent;Sitting Eating/Feeding Details (indicate cue type and reason): though NPO for procedure this AM Grooming: Supervision/safety;Standing;Oral care   Upper Body Bathing: Independent;Sitting   Lower Body Bathing: Supervison/ safety;Sit to/from stand Lower Body Bathing Details (indicate cue type and reason): for safety in bathing posterior region in standing, difficulty managing with gown/lines         Toilet Transfer: Supervision/safety;Ambulation Toilet Transfer Details (indicate cue type and reason): supervision for safety with lines           General ADL Comments: Pt with minor deficits in endurance though progressing with increased activity. Discussed possibility of shower chair use if endurance insufficient for standing showering tasks. Though if pt able to ambulate around unit with only mild SOB or none - will likely not need shower chair     Vision Baseline Vision/History: Wears glasses Wears Glasses: At all times Patient Visual Report: No change from baseline Vision Assessment?: No apparent visual deficits     Perception     Praxis      Pertinent Vitals/Pain Pain Assessment: No/denies pain     Hand Dominance Right   Extremity/Trunk Assessment Upper Extremity Assessment Upper Extremity Assessment: Overall WFL for tasks assessed   Lower Extremity Assessment Lower Extremity Assessment: Defer  to PT evaluation   Cervical / Trunk Assessment Cervical / Trunk Assessment: Normal   Communication Communication Communication: No difficulties   Cognition Arousal/Alertness: Awake/alert Behavior During Therapy: WFL for tasks assessed/performed Overall Cognitive Status: Within Functional Limits for tasks assessed                                      General Comments  BP 135/82 after activity, HR up to 113bpm, SpO2 93% and above on RA    Exercises     Shoulder Instructions      Home Living Family/patient expects to be discharged to:: Other (Comment) (sober living home) Living Arrangements: Other (Comment) (8 other males in sober living home) Available Help at Discharge: Friend(s);Available PRN/intermittently Type of Home: House Home Access: Stairs to enter Entrance Stairs-Number of Steps: 3   Home Layout: One level;Laundry or work area in basement     [employer] Shower/Tub: Chief Strategy Officer: [employer]: None   Additional Comments: pt is living in a recovery home and reports other members can assist      Prior Functioning/Environment Level of Independence: Independent        Comments: no use of AD for mobility. Independent with IADLs in the home        OT Problem List: Decreased activity tolerance;Cardiopulmonary status limiting activity      OT Treatment/Interventions: Self-care/ADL training;Therapeutic exercise;Energy conservation;Therapeutic activities;Patient/family education    OT Goals(Current goals can be found in the care plan section) Acute Rehab OT Goals Patient Stated Goal: return home OT Goal Formulation: With patient Time For Goal Achievement: 12/30/20 Potential to Achieve Goals: Good ADL Goals Pt Will Perform Lower Body Dressing: Independently;sit to/from stand Pt Will Transfer to Toilet: Independently;ambulating Pt Will Perform Tub/Shower Transfer: Tub transfer;with set-up;ambulating Additional ADL Goal #1: Pt to verbalize at least 2 energy conservation strategies to implement during ADLs/IADLs Additional ADL Goal #2: Pt to increase standing tolerance > 7-10 min during ADL/IADL tasks to maximize endurance  OT Frequency: Min 2X/week   Barriers to D/C:            Co-evaluation              AM-PAC OT "6  Clicks" Daily Activity     Outcome Measure Help from another person eating meals?: None Help from another person taking care of personal grooming?: A Little Help from another person toileting, which includes using toliet, bedpan, or urinal?: A Little Help from another person bathing (including washing, rinsing, drying)?: A Little Help from another person to put on and taking off regular upper body clothing?: None Help from another person to put on and taking off regular lower body clothing?: A Little 6 Click Score: 20   End of Session Equipment Utilized During Treatment: Gait belt Nurse Communication: Mobility status;Other (comment) (VSS on RA)  Activity Tolerance: Patient tolerated treatment well Patient left: in chair;with call bell/phone within reach  OT Visit Diagnosis: Other (comment) (decreased cardiopulmonary tolerance)                Time: 0932-6712 OT Time Calculation (min): 28 min Charges:  OT General Charges $OT Visit: 1 Visit OT Evaluation $OT Eval Moderate Complexity: 1 Mod OT Treatments $Self Care/Home Management : 8-22 mins  Bradd Canary, OTR/L Acute Rehab Services Office: [phone]  Lorre Munroe 12/16/2020, 8:30 AM

## 2020-12-16 NOTE — Interval H&P Note (Signed)
History and Physical Interval Note:  12/16/2020 11:52 AM  Kevin [name]  has presented today for surgery, with the diagnosis of HF.  The various methods of treatment have been discussed with the patient and family. After consideration of risks, benefits and other options for treatment, the patient has consented to  Procedure(s): RIGHT HEART CATH (N/A) as a surgical intervention.  The patient's history has been reviewed, patient examined, no change in status, stable for surgery.  I have reviewed the patient's chart and labs.  Questions were answered to the patient's satisfaction.     Cobi Delph [employer]

## 2020-12-16 NOTE — Progress Notes (Signed)
ANTICOAGULATION CONSULT NOTE - Follow Up Consult  Pharmacy Consult for heparin Indication: atrial fibrillation  Labs: Recent Labs    12/14/20 0437 12/14/20 0437 12/14/20 0845 12/14/20 1600 12/15/20 0323 12/15/20 0324 12/16/20 0454  HGB  --    < > 8.5*  --   --  8.1* 8.2*  HCT  --   --  26.2*  --   --  25.5* 25.4*  PLT  --   --  144*  --   --  166 177  HEPARINUNFRC 0.55  --   --   --   --  0.63 0.35  CREATININE 2.06*  --   --  2.39* 2.57*  --  3.06*   < > = values in this interval not displayed.    Assessment/Plan:  65 y/o male on heparin for Afib while holding home Eliquis. Pt s/p DCCV 5/16. Patient continues to have intermittently irregular rhythm and is now off of CRRT  Heparin level at goal this AM (0.35, therapeutic)  Patient did have maroon stool x2 episodes last week  CBC: Hgb 8.2, Plt 177 (scheduled to receive IV iron, Feraheme, today)   Goal of Therapy:  Heparin level 0.3-0.7 units/ml Monitor platelets by anticoagulation protocol: Yes  Plan: Continue heparin at current rate 1300 units / hour Daily heparin level and CBC F/u s/sx bleeding  F/u switch back to PTA Eliquis  Calton Dach, PharmD PGY1 Pharmacy Resident 12/16/2020 6:40 AM

## 2020-12-17 DIAGNOSIS — I5043 Acute on chronic combined systolic (congestive) and diastolic (congestive) heart failure: Secondary | ICD-10-CM | POA: Diagnosis not present

## 2020-12-17 LAB — RENAL FUNCTION PANEL
Albumin: 2.6 g/dL — ABNORMAL LOW (ref 3.5–5.0)
Anion gap: 13 (ref 5–15)
BUN: 38 mg/dL — ABNORMAL HIGH (ref 8–23)
CO2: 28 mmol/L (ref 22–32)
Calcium: 8.6 mg/dL — ABNORMAL LOW (ref 8.9–10.3)
Chloride: 95 mmol/L — ABNORMAL LOW (ref 98–111)
Creatinine, Ser: 2.68 mg/dL — ABNORMAL HIGH (ref 0.61–1.24)
GFR, Estimated: 25 mL/min — ABNORMAL LOW (ref >60)
Glucose, Bld: 321 mg/dL — ABNORMAL HIGH (ref 70–99)
Phosphorus: 4.7 mg/dL — ABNORMAL HIGH (ref 2.5–4.6)
Potassium: 3.7 mmol/L (ref 3.5–5.1)
Sodium: 136 mmol/L (ref 135–145)

## 2020-12-17 LAB — HEPARIN LEVEL (UNFRACTIONATED)
Heparin Unfractionated: 0.16 IU/mL — ABNORMAL LOW (ref 0.30–0.70)
Heparin Unfractionated: <0.1 IU/mL — ABNORMAL LOW (ref 0.30–0.70)
Heparin Unfractionated: 0.4 IU/mL (ref 0.30–0.70)

## 2020-12-17 LAB — PREPARE RBC (CROSSMATCH)

## 2020-12-17 LAB — CBC
HCT: 24.6 % — ABNORMAL LOW (ref 39.0–52.0)
Hemoglobin: 7.8 g/dL — ABNORMAL LOW (ref 13.0–17.0)
MCH: 23.7 pg — ABNORMAL LOW (ref 26.0–34.0)
MCHC: 31.7 g/dL (ref 30.0–36.0)
MCV: 74.8 fL — ABNORMAL LOW (ref 80.0–100.0)
Platelets: 190 10*3/uL (ref 150–400)
RBC: 3.29 MIL/uL — ABNORMAL LOW (ref 4.22–5.81)
RDW: 20 % — ABNORMAL HIGH (ref 11.5–15.5)
WBC: 7.7 10*3/uL (ref 4.0–10.5)
nRBC: 0 % (ref 0.0–0.2)

## 2020-12-17 LAB — GLUCOSE, CAPILLARY
Glucose-Capillary: 106 mg/dL — ABNORMAL HIGH (ref 70–99)
Glucose-Capillary: 121 mg/dL — ABNORMAL HIGH (ref 70–99)
Glucose-Capillary: 121 mg/dL — ABNORMAL HIGH (ref 70–99)

## 2020-12-17 LAB — ABO/RH: ABO/RH(D): O POS

## 2020-12-17 LAB — PROTIME-INR
INR: 1.1 (ref 0.8–1.2)
Prothrombin Time: 14.5 seconds (ref 11.4–15.2)

## 2020-12-17 LAB — COOXEMETRY PANEL
Carboxyhemoglobin: 1.1 % (ref 0.5–1.5)
Methemoglobin: 1.5 % (ref 0.0–1.5)
O2 Saturation: 64.1 %
Total hemoglobin: 8 g/dL — ABNORMAL LOW (ref 12.0–16.0)

## 2020-12-17 LAB — MAGNESIUM: Magnesium: 2 mg/dL (ref 1.7–2.4)

## 2020-12-17 MED ORDER — SODIUM CHLORIDE 0.9% IV SOLUTION: Freq: Once | INTRAVENOUS | Status: DC

## 2020-12-17 MED ORDER — POTASSIUM CHLORIDE CRYS ER 20 MEQ PO TBCR
40.0000 meq | EXTENDED_RELEASE_TABLET | Freq: Once | ORAL | Status: AC
  Administered 2020-12-17: 40 meq via ORAL
  Filled 2020-12-17: qty 2

## 2020-12-17 MED ORDER — DARBEPOETIN ALFA 40 MCG/0.4ML IJ SOSY
40.0000 ug | PREFILLED_SYRINGE | Freq: Once | INTRAMUSCULAR | Status: AC
  Administered 2020-12-17: 40 ug via SUBCUTANEOUS
  Filled 2020-12-17: qty 0.4

## 2020-12-17 NOTE — Progress Notes (Signed)
Patient ID: Kevin [name], male   DOB: 12-15-1955, 65 y.o.   MRN: [id number]  1 Day Post-Op Subjective: Pt seen earlier in hospitalization for hematuria.  S/P cardioversion 5/16 and remains anticoagulated.  Called to see patient again due to increasing hematuria with some clots.  He had an episode of apparent retention over last 24 hrs resulting in I and O catheterization.  No urine available to visualize right now but patient describes urine as intermittently "plum colored" with some clots.  He feels he has been voiding better the last couple of times.  He did receive a unit of blood due to Hgb 7.8 although unclear as to whether this is related to hematuria or active bleeding as Hgb was 8.2 yesterday AM, 9.9 yesterday afternoon, and 7.8 today.    Objective: Vital signs in last 24 hours: Temp:  [98 F (36.7 C)-98.8 F (37.1 C)] 98.7 F (37.1 C) (05/24 1645) Pulse Rate:  [32-139] 61 (05/24 1600) Resp:  [12-26] 22 (05/24 1600) BP: (97-154)/(57-111) 154/111 (05/24 1600) SpO2:  [92 %-100 %] 93 % (05/24 1600) Weight:  [107.1 kg] 107.1 kg (05/24 0616)  Intake/Output from previous day: 05/23 0701 - 05/24 0700 In: 2182.9 [P.O.:1020; I.V.:959.7; IV Piggyback:203.2] Out: 2600 [Urine:2600] Intake/Output this shift: Total I/O In: 1042.2 [P.O.:360; I.V.:346.2; Blood:336] Out: 300 [Urine:300]  Physical Exam:  General: Alert and oriented   Lab Results: Recent Labs    12/16/20 0454 12/16/20 1212 12/17/20 0358  HGB 8.2* 9.9*  9.9* 7.8*  HCT 25.4* 29.0*  29.0* 24.6*   BMET Recent Labs    12/16/20 0454 12/16/20 1212 12/17/20 0358  NA 139 141  141 136  K 3.7 3.9  3.9 3.7  CL 101  --  95*  CO2 31  --  28  GLUCOSE 87  --  321*  BUN 39*  --  38*  CREATININE 3.06*  --  2.68*  CALCIUM 9.0  --  8.6*     Studies/Results:   Assessment/Plan: 1) Hematuria: Pt adamantly refuses any urologic intervention or catheterization at this time.  He is subjectively voiding well and his Hgb is  relatively stable over past 24 hrs.  I am not sure he will be willing to undergo catheter placement even if absolutely indicated but we discussed indications including urinary retention or continued blood loss requiring transfusion.  He will need outpatient evaluation for his hematuria.  Not able to receive IV contrast due to renal dysfunction.  Prior renal and bladder ultrasound reassuring.  Will eventually need cystoscopy as well as outpatient (if he is amenable).  Will continue to follow.   LOS: 15 days   Crecencio Mc 12/17/2020, 5:12 PM

## 2020-12-17 NOTE — Progress Notes (Signed)
ANTICOAGULATION CONSULT NOTE - Follow Up Consult  Pharmacy Consult for heparin Indication: atrial fibrillation  Labs: Recent Labs    12/15/20 0323 12/15/20 0324 12/15/20 0324 12/16/20 0454 12/16/20 1212 12/17/20 0358 12/17/20 1414 12/17/20 2224  HGB  --  8.1*   < > 8.2* 9.9*  9.9* 7.8*  --   --   HCT  --  25.5*   < > 25.4* 29.0*  29.0* 24.6*  --   --   PLT  --  166  --  177  --  190  --   --   LABPROT  --   --   --   --   --   --   --  14.5  INR  --   --   --   --   --   --   --  1.1  HEPARINUNFRC  --  0.63   < > 0.35  --  0.16* <0.10* 0.40  CREATININE 2.57*  --   --  3.06*  --  2.68*  --   --    < > = values in this interval not displayed.    Assessment/Plan:  65 y/o male on heparin for Afib while holding home Eliquis. Pt s/p DCCV 5/16. Patient continues to have intermittently irregular rhythm and is now off of CRRT. Patient underwent RHC 5/23 and heparin was held for this procedure.   Patient Hgb drifted down to 7.8, Plt 190 and bleeding and passing clots after in/out cath is again noted in the chart. Patient has received 1u PRBC today. This seems to be a chronic issue for him as well.   Heparin level therapeutic (0.4) on gtt at 1650 units/hr.  Goal of Therapy:  Heparin level 0.3-0.7 units/ml Monitor platelets by anticoagulation protocol: Yes  Plan: Continue heparin at current rate of 1650 units / hour Daily heparin level and CBC F/u s/sx bleeding  F/u switch back to PTA Eliquis as appropriate  Christoper Fabian, PharmD, BCPS Please see amion for complete clinical pharmacist phone list 12/17/2020 11:51 PM

## 2020-12-17 NOTE — Progress Notes (Addendum)
Patient ID: Kevin [name], male   DOB: [birth year], 65 y.o.   MRN: [id number]     Advanced Heart Failure Rounding Note  PCP-Cardiologist: Chrystie Nose, MD   Subjective:    - CVVH started 5/12 with intractable volume overload.   - TEE/DCCV>>NSR 5/16 - CVVH stopped 5/20 -5/23  RHC with preserved cardiac output, normal pcwp, and mildly RA pressure. Had urinary retention with hematuria  - On amiodarone 30 mg/hr and heparin gtt.     Creatinine trending down 3.06> 2.68, made > 2600 cc urine.   CO-OX stable 64%.    Feeling ok. Does not want another catheter. Had hematuria with I &O catheter.   RHC Procedural Findings: Hemodynamics (mmHg) RA mean 10 RV 35/10 PA 36/16, mean 24 PCWP mean 14 Oxygen saturations: PA 57% AO 100% Cardiac Output (Fick) 6.43  Cardiac Index (Fick) 2.77 PVR 1.6 WU Cardiac Output (Thermo) 4.85  Cardiac Index (Thermo) 2.09  Objective:   Weight Range: 107.1 kg Body mass index is 31.15 kg/m.   Vital Signs:   Temp:  [98.3 F (36.8 C)-98.9 F (37.2 C)] 98.3 F (36.8 C) (05/24 0007) Pulse Rate:  [32-188] 82 (05/24 0600) Resp:  [12-20] 19 (05/24 0600) BP: (97-146)/(57-96) 121/81 (05/24 0600) SpO2:  [92 %-100 %] 100 % (05/24 0600) Weight:  [107.1 kg] 107.1 kg (05/24 0616) Last BM Date: 12/15/20  Weight change: Filed Weights   12/15/20 0500 12/16/20 0500 12/17/20 0616  Weight: 110.8 kg 108.2 kg 107.1 kg    Intake/Output:   Intake/Output Summary (Last 24 hours) at 12/17/2020 0719 Last data filed at 12/17/2020 0600 Gross per 24 hour  Intake 2136.54 ml  Output 2600 ml  Net -463.46 ml      Physical Exam  CVP 8-9  General:   No resp difficulty HEENT: normal Neck: supple. no JVD. Carotids 2+ bilat; no bruits. No lymphadenopathy or thryomegaly appreciated. LIJ HD catheter.  Cor: PMI nondisplaced. Irregular rate & rhythm. No rubs, gallops or murmurs. Lungs: clear Abdomen: soft, nontender, nondistended. No hepatosplenomegaly. No bruits or  masses. Good bowel sounds. Extremities: no cyanosis, clubbing, rash, R and LLE unna boots. RUE PICC  Neuro: alert & orientedx3, cranial nerves grossly intact. moves all 4 extremities w/o difficulty. Affect pleasant  Telemetry   A Flutter 90s  Labs    CBC Recent Labs    12/16/20 0454 12/16/20 1212 12/17/20 0358  WBC 8.5  --  7.7  HGB 8.2* 9.9*  9.9* 7.8*  HCT 25.4* 29.0*  29.0* 24.6*  MCV 73.2*  --  74.8*  PLT 177  --  190   Basic Metabolic Panel Recent Labs    01/17/75 0454 12/16/20 1212 12/17/20 0358  NA 139 141  141 136  K 3.7 3.9  3.9 3.7  CL 101  --  95*  CO2 31  --  28  GLUCOSE 87  --  321*  BUN 39*  --  38*  CREATININE 3.06*  --  2.68*  CALCIUM 9.0  --  8.6*  MG 1.8  --  2.0  PHOS 5.0*  --  4.7*   Liver Function Tests Recent Labs    12/16/20 0454 12/17/20 0358  ALBUMIN 2.6* 2.6*   No results for input(s): LIPASE, AMYLASE in the last 72 hours. Cardiac Enzymes No results for input(s): CKTOTAL, CKMB, CKMBINDEX, TROPONINI in the last 72 hours.  BNP: BNP (last 3 results) Recent Labs    12/02/20 0915  BNP 1,158.5*    ProBNP (last 3 results)  No results for input(s): PROBNP in the last 8760 hours.   D-Dimer No results for input(s): DDIMER in the last 72 hours. Hemoglobin A1C No results for input(s): HGBA1C in the last 72 hours. Fasting Lipid Panel No results for input(s): CHOL, HDL, LDLCALC, TRIG, CHOLHDL, LDLDIRECT in the last 72 hours. Thyroid Function Tests No results for input(s): TSH, T4TOTAL, T3FREE, THYROIDAB in the last 72 hours.  Invalid input(s): FREET3  Other results:   Imaging    CARDIAC CATHETERIZATION  Result Date: 12/16/2020 1. Normal PCWP, mildly elevated RA pressure. 2. Borderline pulmonary venous hypertension. 3. Relatively preserved cardiac output off milrinone.     Medications:     Scheduled Medications: . (feeding supplement) PROSource Plus  30 mL Oral BID BM  . atorvastatin  10 mg Oral Daily  . B-complex  with vitamin C  1 tablet Oral Daily  . Chlorhexidine Gluconate Cloth  6 each Topical Daily  . insulin aspart  0-5 Units Subcutaneous QHS  . insulin aspart  0-9 Units Subcutaneous TID WC  . loratadine  10 mg Oral Daily  . midodrine  10 mg Oral TID WC  . montelukast  10 mg Oral QHS  . pantoprazole  40 mg Oral Daily  . potassium chloride  40 mEq Oral Once  . senna-docusate  1 tablet Oral BID  . sodium chloride flush  10-40 mL Intracatheter Q12H  . sodium chloride flush  3 mL Intravenous Q12H  . sodium chloride flush  3 mL Intravenous Q12H  . sodium chloride flush  3 mL Intravenous Q12H  . torsemide  40 mg Oral Daily    Infusions: . sodium chloride Stopped (12/16/20 1534)  . sodium chloride    . sodium chloride    . amiodarone 60 mg/hr (12/17/20 0600)  . heparin 1,300 Units/hr (12/17/20 0600)    PRN Medications: sodium chloride, Place/Maintain arterial line **AND** sodium chloride, sodium chloride, acetaminophen, ondansetron (ZOFRAN) IV, sodium chloride flush, sodium chloride flush, sodium chloride flush    Assessment/Plan   1. Acute on chronic systolic CHF: Nonischemic cardiomyopathy.  Echo this admission with EF 25-30%, moderate LV dilation, mild LVH, moderate RVE with moderately decreased RV systolic function, severe biatrial enlargement, moderate-severe TR, IVC dilated. EF was low starting in 2016, cath at that time showed mild nonobstructive CAD.  Patient had runs of AF but not sustained when initial diagnosis was made.  Was readmitted in 2018 with persistent AF and CHF, echo with EF 20-25%.  Had DCCV back to NSR at that time.  EF up to 55-60% in 2019.  Lost to followup, now back with progressive dyspnea/volume overload and EF back down in setting of AF with RVR.  Difficult to identify initial etiology of CMP but cannot rule out tachy-mediated.  UDS negative for cocaine. 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  He does not feel AF and we are not sure how  long he has been in it.  He was 30+ lbs up with marked volume overload and concern for low output HF (progressive rise in creatinine with attempts at diuresis).  Poor response to initiation of milrinone 0.25 then norepinephrine, creatinine continued to rise and CVVH started 5/12. CVVH stopped 5/20 with good UOP on IV Lasix. NSR this morning with PACs.  Now off milrinone and NE, CO-OX 64%.   -CVP 8. Continue Torsemide 40 mg po daily.  -Hold off on coronary angiography with renal dysfunction.   - Want to maintain NSR, continue amiodarone.  - Consider eventual  cardiac MRI.  - Unna boots.  - BP stable, continue midodrine to 10 mg tid.   - BP soft no room for bb with low output.  2. Atrial fibrillation: Admitted with afib/RVR, uncertain how long this has been present.  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 S/p TEE/DCCV 5/16. Has been in and out of atrial fibrillation/flutter. .  - Continue amiodarone gtt at 30 mg/hr for now.  - EKG now.    - On heparin drip.  3. AKI: Uncertain baseline, creatinine 1.77 at arrival, creatinine up to 3.49 with poor diuresis and CVVH started with intractable volume overload. Now off CVVH.  Creatinine 3.06  > 2.7  - Nephrology appreciated.  - Follow creatinine closely, hopefully will stabilize soon.   4. Hematuria: Seen by urology => think old blood in urine, renal ultrasound ok.  Recommend continuing heparin gtt.   Recurrent hematuria. Will ask Urology to see again. He is adamant he does not want catheter again.  5. Iron-deficieny anemia: Maroon stool has been seen. Hgb down to 7.8. - Transfuse 1uPRBCs.  Had hematuria 5/23   - received feraheme  - Follow closely, transfuse hgb < 8.   6. Deconditioning: Consult PT. OOB.   Length of Stay: 15  Amy Clegg, NP  12/17/2020, 7:19 AM  Advanced Heart Failure Team Pager [phone] (M-F; 7a - 5p)  Please contact [facility] for night-coverage after hours (5p -7a ) and weekends on [email or web address]  Patient seen with NP,  agree with the above note.   Patient remains in atrial flutter on amiodarone 30 mg/hr.    Co-ox 64% off inotropes.   CVP 8, good diuresis yesterday with torsemide 40 mg daily with BUN/creatinine continue coming down.  Nephrology is following.   BP stable on midodrine 10 mg tid.   General: NAD Neck: JVP 8 cm, no thyromegaly or thyroid nodule.  Lungs: Clear to auscultation bilaterally with normal respiratory effort. CV: Nondisplaced PMI.  Heart irregular S1/S2, no S3/S4, no murmur.  Trace ankle edema.   Abdomen: Soft, nontender, no hepatosplenomegaly, no distention.  Skin: Intact without lesions or rashes.  Neurologic: Alert and oriented x 3.  Psych: Normal affect. Extremities: No clubbing or cyanosis.  HEENT: Normal.   Patient remains in atrial flutter.  Continue heparin gtt and increase amiodarone to 60 mg/hr.  If he does not convert to NSR, plan DCCV (aim for tomorrow).  As above, may consider addition of ranolazine but would like to see renal function settle first.   CVP 8 today with good UOP with torsemide 40 mg daily.  Creatinine/BUN coming down.  - Continue current torsemide.  - Renal following, hopefully can avoid HD.  - Continue midodrine at current level, BP stable.   Co-ox good off inotropes.   Hgb 7.8, hematuria again. Refuses catheter.  Will ask urology to see again, but need to continue heparin gtt to be able to do DCCV tomorrow.  Will give 1 unit PRBCs today.   Marca Ancona 12/17/2020 9:05 AM

## 2020-12-17 NOTE — Progress Notes (Signed)
ANTICOAGULATION CONSULT NOTE - Follow Up Consult  Pharmacy Consult for heparin Indication: atrial fibrillation  Labs: Recent Labs    12/15/20 0323 12/15/20 0324 12/15/20 0324 12/16/20 0454 12/16/20 1212 12/17/20 0358 12/17/20 1414  HGB  --  8.1*   < > 8.2* 9.9*  9.9* 7.8*  --   HCT  --  25.5*   < > 25.4* 29.0*  29.0* 24.6*  --   PLT  --  166  --  177  --  190  --   HEPARINUNFRC  --  0.63   < > 0.35  --  0.16* <0.10*  CREATININE 2.57*  --   --  3.06*  --  2.68*  --    < > = values in this interval not displayed.    Assessment/Plan:  65 y/o male on heparin for Afib while holding home Eliquis. Pt s/p DCCV 5/16. Patient continues to have intermittently irregular rhythm and is now off of CRRT. Patient underwent RHC 5/23 and heparin was held for this procedure.   Patient Hgb drifted down to 7.8, Plt 190 and bleeding and passing clots after in/out cath is again noted in the chart. Patient has received 1u PRBC today. This seems to be a chronic issue for him as well. Urology has been consulted  Heparin level <0.1, subtherapeutic  Goal of Therapy:  Heparin level 0.3-0.7 units/ml Monitor platelets by anticoagulation protocol: Yes  Plan: Recheck heparin level at 2300  Continue heparin at current rate of 1650 units / hour Daily heparin level and CBC F/u s/sx bleeding  F/u switch back to PTA Eliquis as appropriate   Calton Dach, PharmD PGY1 Pharmacy Resident 12/17/2020 3:21 PM

## 2020-12-17 NOTE — Progress Notes (Signed)
Nephrology Progress Note:    Subjective:  had 2.2 liters UOP over 5/23.  He had gross hematuria with clots which began after in/out cath per nursing note.  Post void elevated to  468 ml at that time per charting.  He declines a foley now and states that he has been urinating after this episode and that "it's flowing pretty good".  He doesn't know if he's passed additional clots but stated this happened once before several months ago.  With regard to diuretics states is on lasix BID at home but doesn't know dose.  Review of systems:  Denies shortness of breath or chest pain  Denies n/v Not on oxygen at home  States urinating now without difficulty   Objective Vital signs in last 24 hours: Vitals:   12/17/20 0200 12/17/20 0309 12/17/20 0440 12/17/20 0500  BP: 97/64 120/76 128/80 118/78  Pulse: (!) 59 (!) 32 91 90  Resp: 13 16 15 12   Temp:      TempSrc:      SpO2: 95% 99% 100% 100%  Weight:      Height:       Weight change:   Intake/Output Summary (Last 24 hours) at 12/17/2020 0536 Last data filed at 12/17/2020 0500 Gross per 24 hour  Intake 2148.15 ml  Output 2150 ml  Net -1.85 ml    Labs: Basic Metabolic Panel: Recent Labs  Lab 12/15/20 0323 12/16/20 0454 12/16/20 1212 12/17/20 0358  NA 134* 139 141  141 136  K 3.6 3.7 3.9  3.9 3.7  CL 96* 101  --  95*  CO2 30 31  --  28  GLUCOSE 170* 87  --  321*  BUN 33* 39*  --  38*  CREATININE 2.57* 3.06*  --  2.68*  CALCIUM 8.9 9.0  --  8.6*  PHOS 4.9* 5.0*  --  4.7*   Liver Function Tests: Recent Labs  Lab 12/15/20 0323 12/16/20 0454 12/17/20 0358  ALBUMIN 2.6* 2.6* 2.6*   CBC: Recent Labs  Lab 12/13/20 0433 12/14/20 0845 12/15/20 0324 12/16/20 0454 12/16/20 1212 12/17/20 0358  WBC 8.5 9.1 11.0* 8.5  --  7.7  HGB 8.5* 8.5* 8.1* 8.2* 9.9*  9.9* 7.8*  HCT 26.8* 26.2* 25.5* 25.4* 29.0*  29.0* 24.6*  MCV 73.8* 73.4* 73.7* 73.2*  --  74.8*  PLT 143* 144* 166 177  --  190   Cardiac Enzymes: No results for  input(s): CKTOTAL, CKMB, CKMBINDEX, TROPONINI in the last 168 hours. CBG: Recent Labs  Lab 12/15/20 2138 12/16/20 0655 12/16/20 1102 12/16/20 1545 12/16/20 2051  GLUCAP 104* 83 106* 158* 120*    Iron Studies:  No results for input(s): IRON, TIBC, TRANSFERRIN, FERRITIN in the last 72 hours. Studies/Results: CARDIAC CATHETERIZATION  Result Date: 12/16/2020 1. Normal PCWP, mildly elevated RA pressure. 2. Borderline pulmonary venous hypertension. 3. Relatively preserved cardiac output off milrinone.   Medications: Infusions: . sodium chloride Stopped (12/16/20 1534)  . sodium chloride    . sodium chloride    . amiodarone 60 mg/hr (12/17/20 0500)  . heparin 1,300 Units/hr (12/17/20 0500)    Scheduled Medications: . (feeding supplement) PROSource Plus  30 mL Oral BID BM  . atorvastatin  10 mg Oral Daily  . B-complex with vitamin C  1 tablet Oral Daily  . Chlorhexidine Gluconate Cloth  6 each Topical Daily  . insulin aspart  0-5 Units Subcutaneous QHS  . insulin aspart  0-9 Units Subcutaneous TID WC  . loratadine  10  mg Oral Daily  . midodrine  10 mg Oral TID WC  . montelukast  10 mg Oral QHS  . pantoprazole  40 mg Oral Daily  . senna-docusate  1 tablet Oral BID  . sodium chloride flush  10-40 mL Intracatheter Q12H  . sodium chloride flush  3 mL Intravenous Q12H  . sodium chloride flush  3 mL Intravenous Q12H  . sodium chloride flush  3 mL Intravenous Q12H  . torsemide  40 mg Oral Daily    have reviewed scheduled and prn medications.  Physical Exam:    General adult male in bed in no acute distress HEENT normocephalic atraumatic extraocular movements intact sclera anicteric Neck supple trachea midline Lungs clear to auscultation bilaterally normal work of breathing at rest  Heart S1S2 no rub Abdomen soft nontender nondistended/obese habitus Extremities no lower extremity edema; legs wrapped; LUE edema Psych normal mood and affect Neuro alert & oriented and answers  questions gu - no foley in place Access left IJ nontunneled catheter    Assessment/ Plan: Pt is a 65 y.o. yo male -  crt 1.11 a year ago, who was admitted on 12/02/2020 with Afib and volume overload.   Initial crt 1.7 but increased in the setting of attempted diuresis and continued hemodyanamic instability  -  CRRT 5/12 - 5/20  1. Cardiomyopathy - TEE with EF of 25%, moderately decreased RV function, mitral and tricuspid regurgitation.  Heart failure managing.   2. AKI on CKD - baseline between 1.1-1.7 (1.7 on admit). Likely cardiorenal syndrome with ATN related to hemodynamic instability. CRRT started given volume overload 5/12-5/20.  Note hx of gross hematuria resolved.  - stable today - nontunneled dialysis catheter in place - will place order to remove  - note torsemide restarted 5/23  - address gross hematuria as below  3. Fluid volume overload - improved s/p RRT.  Got torsemide 40 mg starting 5/22.  4.  A fib control per primary; on amio  5. Anemia microcytic- some maroon stools this hospitalization per charting.  Iron deficiency s/p feraheme 5/15.  Redosed 5/23.  Discussed risks/benefits/indications of aranesp and he consents to receive.  Give aranesp 40 mcg on 5/24  6. HTN  Charted as hx of HTN.  Now on midodrine.   7. Gross hematuria with clots - patient declines foley catheter at this time.  Would consult urology as he states is recurrent issue   Estanislado Emms, MD 12/17/2020,5:51 AM

## 2020-12-17 NOTE — Progress Notes (Signed)
  Attempted to order DC_CV tomorrow. No spots. Possible for Thursday.   Will make NPO MN for possible cardioversion tomorrow if there are any cancellations.   Tonye Becket NP- C 9:11 AM

## 2020-12-17 NOTE — Progress Notes (Signed)
Patient complaining of pain and urgency when voiding. Urine is noted to be dark red with presence of blood clots. Per patient this pain began after have I&O cath done earlier in the day  0000: Bladder scan done with  Patient is only able to void about 100 ml and is requesting  hold off on any further I&O cath for the remainder to the shift until he can speak with MD in the morning. He denies any pressure or pain in his bladder at this time, says there is only penial pain when voiding.

## 2020-12-17 NOTE — Progress Notes (Signed)
ANTICOAGULATION CONSULT NOTE - Follow Up Consult  Pharmacy Consult for heparin Indication: atrial fibrillation  Labs: Recent Labs    12/15/20 0323 12/15/20 0324 12/16/20 0454 12/16/20 1212 12/17/20 0358  HGB  --  8.1* 8.2* 9.9*  9.9* 7.8*  HCT  --  25.5* 25.4* 29.0*  29.0* 24.6*  PLT  --  166 177  --  190  HEPARINUNFRC  --  0.63 0.35  --  0.16*  CREATININE 2.57*  --  3.06*  --  2.68*    Assessment/Plan:  65 y/o male on heparin for Afib while holding home Eliquis. Pt s/p DCCV 5/16. Patient continues to have intermittently irregular rhythm and is now off of CRRT. Patient underwent RHC 5/23 and heparin was held for this procedure.   Patient Hgb drifted down to 7.8, Plt 190 and bleeding and passing clots after in/out cath is again noted in the chart. However, this seems to be a chronic issue for him as well.  Heparin level 0.16, subtherapeutic  Goal of Therapy:  Heparin level 0.3-0.7 units/ml Monitor platelets by anticoagulation protocol: Yes  Plan: Recheck heparin level at 1100  Continue heparin at current rate of 1300 units / hour Daily heparin level and CBC F/u s/sx bleeding  F/u switch back to PTA Eliquis   Calton Dach, PharmD PGY1 Pharmacy Resident 12/17/2020 6:29 AM

## 2020-12-18 DIAGNOSIS — I5043 Acute on chronic combined systolic (congestive) and diastolic (congestive) heart failure: Secondary | ICD-10-CM | POA: Diagnosis not present

## 2020-12-18 DIAGNOSIS — I484 Atypical atrial flutter: Secondary | ICD-10-CM

## 2020-12-18 DIAGNOSIS — I480 Paroxysmal atrial fibrillation: Secondary | ICD-10-CM | POA: Diagnosis not present

## 2020-12-18 LAB — COOXEMETRY PANEL
Carboxyhemoglobin: 1.2 % (ref 0.5–1.5)
Methemoglobin: 1.4 % (ref 0.0–1.5)
O2 Saturation: 69.2 %
Total hemoglobin: 9.4 g/dL — ABNORMAL LOW (ref 12.0–16.0)

## 2020-12-18 LAB — RENAL FUNCTION PANEL
Albumin: 2.8 g/dL — ABNORMAL LOW (ref 3.5–5.0)
Anion gap: 9 (ref 5–15)
BUN: 38 mg/dL — ABNORMAL HIGH (ref 8–23)
CO2: 30 mmol/L (ref 22–32)
Calcium: 9.4 mg/dL (ref 8.9–10.3)
Chloride: 101 mmol/L (ref 98–111)
Creatinine, Ser: 2.74 mg/dL — ABNORMAL HIGH (ref 0.61–1.24)
GFR, Estimated: 25 mL/min — ABNORMAL LOW (ref >60)
Glucose, Bld: 134 mg/dL — ABNORMAL HIGH (ref 70–99)
Phosphorus: 5.1 mg/dL — ABNORMAL HIGH (ref 2.5–4.6)
Potassium: 4 mmol/L (ref 3.5–5.1)
Sodium: 140 mmol/L (ref 135–145)

## 2020-12-18 LAB — GLUCOSE, CAPILLARY
Glucose-Capillary: 113 mg/dL — ABNORMAL HIGH (ref 70–99)
Glucose-Capillary: 117 mg/dL — ABNORMAL HIGH (ref 70–99)
Glucose-Capillary: 159 mg/dL — ABNORMAL HIGH (ref 70–99)
Glucose-Capillary: 74 mg/dL (ref 70–99)
Glucose-Capillary: 97 mg/dL (ref 70–99)

## 2020-12-18 LAB — BPAM RBC
Blood Product Expiration Date: 202206202359
ISSUE DATE / TIME: 202205240935
Unit Type and Rh: 5100

## 2020-12-18 LAB — CBC
HCT: 28.5 % — ABNORMAL LOW (ref 39.0–52.0)
Hemoglobin: 9.1 g/dL — ABNORMAL LOW (ref 13.0–17.0)
MCH: 24.3 pg — ABNORMAL LOW (ref 26.0–34.0)
MCHC: 31.9 g/dL (ref 30.0–36.0)
MCV: 76.2 fL — ABNORMAL LOW (ref 80.0–100.0)
Platelets: 229 10*3/uL (ref 150–400)
RBC: 3.74 MIL/uL — ABNORMAL LOW (ref 4.22–5.81)
RDW: 21.2 % — ABNORMAL HIGH (ref 11.5–15.5)
WBC: 8.8 10*3/uL (ref 4.0–10.5)
nRBC: 0 % (ref 0.0–0.2)

## 2020-12-18 LAB — TYPE AND SCREEN
ABO/RH(D): O POS
Antibody Screen: NEGATIVE
Unit division: 0

## 2020-12-18 LAB — HEPARIN LEVEL (UNFRACTIONATED): Heparin Unfractionated: 0.57 IU/mL (ref 0.30–0.70)

## 2020-12-18 LAB — MAGNESIUM: Magnesium: 1.8 mg/dL (ref 1.7–2.4)

## 2020-12-18 MED ORDER — MAGNESIUM SULFATE 2 GM/50ML IV SOLN
2.0000 g | Freq: Once | INTRAVENOUS | Status: AC
  Administered 2020-12-18: 2 g via INTRAVENOUS
  Filled 2020-12-18: qty 50

## 2020-12-18 MED ORDER — ENSURE ENLIVE PO LIQD
237.0000 mL | ORAL | Status: DC
  Administered 2020-12-18 – 2020-12-20 (×3): 237 mL via ORAL

## 2020-12-18 MED ORDER — APIXABAN 5 MG PO TABS
5.0000 mg | ORAL_TABLET | Freq: Two times a day (BID) | ORAL | Status: DC
  Administered 2020-12-18 – 2020-12-21 (×7): 5 mg via ORAL
  Filled 2020-12-18 (×7): qty 1

## 2020-12-18 MED ORDER — TORSEMIDE 20 MG PO TABS: 20.0000 mg | ORAL_TABLET | Freq: Every day | ORAL | Status: DC

## 2020-12-18 NOTE — Progress Notes (Signed)
ANTICOAGULATION CONSULT NOTE - Follow Up Consult  Pharmacy Consult for heparin Indication: atrial fibrillation  Labs: Recent Labs    12/16/20 0454 12/16/20 1212 12/17/20 0358 12/17/20 1414 12/17/20 2224 12/18/20 0439  HGB 8.2* 9.9*  9.9* 7.8*  --   --  9.1*  HCT 25.4* 29.0*  29.0* 24.6*  --   --  28.5*  PLT 177  --  190  --   --  229  LABPROT  --   --   --   --  14.5  --   INR  --   --   --   --  1.1  --   HEPARINUNFRC 0.35  --  0.16* <0.10* 0.40 0.57  CREATININE 3.06*  --  2.68*  --   --  2.74*    Assessment/Plan:  65 y/o male on heparin for Afib while holding home Eliquis. Pt s/p DCCV 5/16. Patient continues to have intermittently irregular rhythm and is now off of CRRT. Repeat DCCV is likely to be scheduled for Thursday 5/26.   CBC: Hgb 9.1, Plt 221 Heparin level therapeutic (0.57) on gtt at 1650 units/hr.  Goal of Therapy:  Heparin level 0.3-0.7 units/ml Monitor platelets by anticoagulation protocol: Yes  Plan: Continue heparin at current rate of 1650 units / hour Daily heparin level and CBC F/u s/sx bleeding  F/u switch back to PTA Eliquis as appropriate  Calton Dach, PharmD PGY1 Pharmacy Resident 12/18/2020 6:29 AM

## 2020-12-18 NOTE — Consult Note (Addendum)
Cardiology Consultation:   Patient ID: Kevin [name] MRN: [id number]; DOB: November 06, 1955  Admit date: 12/02/2020 Date of Consult: 12/18/2020  PCP:  Azzie Glatter, FNP (Inactive)   [facility] Providers Cardiologist:  Pixie Casino, MD        Patient Profile:   Kevin [name] is a 65 y.o. male with a hx of AFib, NICM, HLD, DM, obesity who is being seen 12/18/2020 for the evaluation of AFlutter at the request of Dr. Aundra Dubin.  History of Present Illness:   Kevin [name] was admitted 12/02/20 with a month or two of escalating fatigue, SOB, DOE at the time of admission, with marked DOE with minimal exertiona as well as orthopnea and PND. He was in Afib w/RVR and volume OL. HF team brought on board with recurrent reduction in LVEF as well as RV dysfunction  HS Trop were midly abnormal, not c/w ACS He was started on milrinone for low output failure lasix gtt  Hematuria: Seen by urology => think old blood in urine, renal ultrasound ok.  Recommend continuing heparin gtt.  Has foley. No further gross hematuria  amio started to aid in rate control with plans towards DCCV when clinical picture allowed  moved to ICU 12/05/20 and started on nor-epi and eventually started on CVVH with worsened AKI and poor urine OP Planned for RHC (possible LHC) though felt likely tachy-mediated CM midodrine added 2/2 low BP with NE and milrinone 12/09/20 TEE/DCCV > SR Milrinone stopped 12/10/20 12/12/20 developed AFlutter, still on NE and CVVH, midodrine, making urine with lasix 12/13/20; CVVH stopped, off NE 5/23  RHC with preserved cardiac output, normal pcwp, and mildly RA pressure. Had urinary retention with I/o cath had  Hematuria, urology revisit with recs for oout patient follow up  EP is asked to see to consider AFlutter ablation that has been his predominant rhythm the last few days.  today [place] 69.2  LABS K+ 4.0 BUN/Creat 38/2.74 (creat 1.75 >>> 3.84 >> 1.47 >> 1.67 (CVVH stopped) >> 2.57 >> 3.06 >>  2.74 WBC 8.8 H/H 9.1/28/5 (yesterday hgb was 7.8 > 1uPRBC) Plts 229  He has been on amio gtt and heparin gtt since admission Out patient was on Eliquis for a/c, no AAD  I note historically intolerant of amiodarone with nausea back in 2018  He feels "so much better", " I think I could go home".  Never has had CP, palpitations or any kind of cardiac awareness.  He noted the onset and increase in swelling and developed SOB >DOE and that is why he came.  Past Medical History:  Diagnosis Date  . Asthma   . Chronic systolic CHF (congestive heart failure) ([place])    a. 06/2015 Echo: EF 25-30%, mod-sev MR, PASP 11mHg. b. 08/2016: echo showing EF of 20-25% with diffuse HK  . Cocaine use   . Essential hypertension   . Hyperglycemia 09/2019  . Hyperlipidemia 09/2019  . [place]    a. Initially dx 06/2015, paroxysmal at that time. b. Recurrent in Feb 2018 and beyond.  . Moderate mitral regurgitation    a. 06/2015 Echo: Mod-Sev MR. b. 08/2016: echo showing moderate MR.   .Marland [place]ICM ([place]mic [place]    a. 06/2015 Echo: EF 25-30%, normal cors by cath - ? Tachy-mediated;    . Non-obstructive CAD    a. 06/2015 Cath: LM nl, LAD 458mLCX nl, RCA nl, EF 25-30%.  . Tobacco use   . Vitamin D deficiency  Past Surgical History:  Procedure Laterality Date  . CARDIAC CATHETERIZATION N/A 07/26/2015   Procedure: Left Heart Cath and Coronary Angiography;  Surgeon: Jettie Booze, MD;  Location: [facility];  Service: Cardiovascular;  Laterality: N/A;  . CARDIOVERSION N/A 04/09/2017   Procedure: CARDIOVERSION;  Surgeon: Fay Records, MD;  Location: [facility] ENDOSCOPY;  Service: Cardiovascular;  Laterality: N/A;  . CARDIOVERSION N/A 12/09/2020   Procedure: CARDIOVERSION;  Surgeon: Larey Dresser, MD;  Location: [facility] ENDOSCOPY;  Service: Cardiovascular;  Laterality: N/A;  . RIGHT HEART CATH N/A 12/16/2020   Procedure: RIGHT HEART CATH;  Surgeon:  Larey Dresser, MD;  Location: [facility];  Service: Cardiovascular;  Laterality: N/A;  . TEE WITHOUT CARDIOVERSION N/A 04/09/2017   Procedure: TRANSESOPHAGEAL ECHOCARDIOGRAM (TEE);  Surgeon: Fay Records, MD;  Location: [place];  Service: Cardiovascular;  Laterality: N/A;  . TEE WITHOUT CARDIOVERSION N/A 12/09/2020   Procedure: TRANSESOPHAGEAL ECHOCARDIOGRAM (TEE);  Surgeon: Larey Dresser, MD;  Location: [facility] ENDOSCOPY;  Service: Cardiovascular;  Laterality: N/A;     Home Medications:  Prior to Admission medications   Medication Sig Start Date End Date Taking? Authorizing Provider  albuterol (PROVENTIL) (2.5 MG/3ML) 0.083% nebulizer solution Take 3 mLs (2.5 mg total) by nebulization every 6 (six) hours as needed for wheezing or shortness of breath. Patient taking differently: Take 2.5 mg by nebulization See admin instructions. Nebulize 2.5 mg and inhale into the lungs up to six times a day as needed for shortness of breath or wheezing 04/19/20  Yes Azzie Glatter, FNP  albuterol (VENTOLIN HFA) 108 (90 Base) MCG/ACT inhaler Inhale 2 puffs into the lungs every 6 (six) hours as needed for wheezing or shortness of breath. Patient taking differently: Inhale 2 puffs into the lungs See admin instructions. Inhale 2 puffs into the lungs up to 10-12 times a day (max) as needed for shortness of breath or wheezing 04/19/20  Yes Azzie Glatter, FNP  apixaban (ELIQUIS) 5 MG TABS tablet Take 1 tablet (5 mg total) by mouth 2 (two) times daily. 04/19/20  Yes Azzie Glatter, FNP  atorvastatin (LIPITOR) 10 MG tablet Take 1 tablet (10 mg total) by mouth daily. 04/19/20  Yes Azzie Glatter, FNP  cetirizine (ZYRTEC) 10 MG tablet TAKE 1 TABLET BY MOUTH EVERY DAY Patient taking differently: Take 10 mg by mouth daily. 09/23/20  Yes Azzie Glatter, FNP  fluticasone (FLONASE) 50 MCG/ACT nasal spray SPRAY 2 SPRAYS INTO EACH NOSTRIL EVERY DAY Patient taking differently: Place 1 spray into both  nostrils See admin instructions. Instill 1 spray into each nostril up to six times a day as needed for congestion 05/20/20  Yes Azzie Glatter, FNP  furosemide (LASIX) 40 MG tablet Take 1 tablet (40 mg total) by mouth 2 (two) times daily. Patient taking differently: Take 80 mg by mouth in the morning. 04/19/20  Yes Azzie Glatter, FNP  glipiZIDE (GLUCOTROL) 10 MG tablet Take 1 tablet (10 mg total) by mouth 2 (two) times daily before a meal. 09/25/20  Yes Azzie Glatter, FNP  lansoprazole (PREVACID) 30 MG capsule Take 1 capsule (30 mg total) by mouth daily as needed (for heartburn/indigestion). 09/25/20  Yes Azzie Glatter, FNP  metFORMIN (GLUCOPHAGE) 1000 MG tablet Take 1 tablet (1,000 mg total) by mouth 2 (two) times daily with a meal. 09/25/20  Yes Azzie Glatter, FNP  montelukast (SINGULAIR) 10 MG tablet Take 1 tablet (10 mg total) by mouth at bedtime. 09/25/20  Yes Azzie Glatter, FNP  Multiple Vitamins-Minerals (ADULT ONE DAILY GUMMIES) CHEW Chew 1-2 tablets by mouth daily.   Yes [provider]  naproxen sodium (ALEVE) 220 MG tablet Take 220-440 mg by mouth daily as needed (for pain).   Yes [provider]  OVER THE COUNTER MEDICATION Place 1-2 drops into both eyes See admin instructions. [place] Maximum Redness Relief Cooling Eye Drops- Place 1-2 drops into both eyes up to 4 times a day as needed for irritation   Yes [provider]  sacubitril-valsartan (ENTRESTO) 49-51 MG Take 1 tablet by mouth 2 (two) times daily. 04/19/20  Yes Azzie Glatter, FNP  sitaGLIPtin (JANUVIA) 25 MG tablet Take 1 tablet (25 mg total) by mouth daily. 09/25/20  Yes Azzie Glatter, FNP  ACCU-CHEK GUIDE test strip TEST UP TO 4 TIMES A DAY AS DIRECTED Patient taking differently: 1 each See admin instructions. TEST UP TO 4 TIMES A DAY AS DIRECTED 09/03/20   Azzie Glatter, FNP  Accu-Chek Softclix Lancets lancets USE UP TO 4 TIMES A DAY AS DIRECTED Patient taking differently: 1 each  See admin instructions. USE UP TO 4 TIMES A DAY AS DIRECTED 10/29/20   Azzie Glatter, FNP  blood glucose meter kit and supplies Dispense based on patient and insurance preference. Use up to four times daily as directed. (FOR ICD-10 E10.9, E11.9). 04/19/20   Azzie Glatter, FNP  cyclobenzaprine (FLEXERIL) 10 MG tablet TAKE 1 TABLET BY MOUTH THREE TIMES A DAY AS NEEDED FOR MUSCLE SPASMS Patient not taking: Reported on 12/02/2020 10/10/19   Azzie Glatter, FNP  doxycycline (VIBRA-TABS) 100 MG tablet Take 1 tablet (100 mg total) by mouth 2 (two) times daily. Patient not taking: Reported on 12/02/2020 09/25/20   Azzie Glatter, FNP  metoprolol succinate (TOPROL-XL) 50 MG 24 hr tablet Take 2 tablets (132m) in the morning, 1 tablet (562m in the evening with or after meal. Patient not taking: No sig reported 01/18/18   CaConstance HawMD  spironolactone (ALDACTONE) 25 MG tablet TAKE 1/2 TO 1 (ONE-HALF TO ONE) TABLET BY MOUTH ONCE DAILY AS  DIRECTED. Please make appt for future refills. [phone] attempt. Patient not taking: Reported on 12/02/2020 02/15/19   CaConstance HawMD  Vitamin D, Ergocalciferol, (DRISDOL) 1.25 MG (50000 UNIT) CAPS capsule TAKE 1 CAPSULE (50,000 UNITS TOTAL) BY MOUTH EVERY 7 (SEVEN) DAYS. Patient not taking: Reported on 12/02/2020 03/04/20   StAzzie GlatterFNP    Inpatient Medications: Scheduled Meds: . (feeding supplement) PROSource Plus  30 mL Oral BID BM  . sodium chloride   Intravenous Once  . atorvastatin  10 mg Oral Daily  . B-complex with vitamin C  1 tablet Oral Daily  . Chlorhexidine Gluconate Cloth  6 each Topical Daily  . insulin aspart  0-5 Units Subcutaneous QHS  . insulin aspart  0-9 Units Subcutaneous TID WC  . loratadine  10 mg Oral Daily  . midodrine  10 mg Oral TID WC  . montelukast  10 mg Oral QHS  . pantoprazole  40 mg Oral Daily  . senna-docusate  1 tablet Oral BID  . sodium chloride flush  10-40 mL Intracatheter Q12H  . torsemide   40 mg Oral Daily   Continuous Infusions: . sodium chloride    . amiodarone 30 mg/hr (12/18/20 0900)  . heparin 1,650 Units/hr (12/18/20 0900)   PRN Meds: Place/Maintain arterial line **AND** sodium chloride, acetaminophen, ondansetron (ZOFRAN) IV, sodium chloride flush  Allergies:   No Known Allergies  Social History:   Social History   Socioeconomic History  . Marital status: Single    Spouse name: Not on file  . Number of children: Not on file  . Years of education: Not on file  . Highest education level: Not on file  Occupational History    Employer: [employer]  Tobacco Use  . Smoking status: Former Smoker    Packs/day: 0.50    Years: 40.00    Pack years: 20.00    Types: Cigarettes  . Smokeless tobacco: Never Used  . Tobacco comment: Quit in 2014  Vaping Use  . Vaping Use: Never used  Substance and Sexual Activity  . Alcohol use: Yes    Alcohol/week: 0.0 [name] drinks    Comment: 2-3 drinks per week.  . Drug use: Yes    Comment: Not currently. Quit in 2014. 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  . Sexual activity: Not Currently  Other Topics Concern  . Not on file  Social History Narrative  . Not on file   Social Determinants of Health   Financial Resource Strain: Medium Risk  . Difficulty of Paying Living Expenses: Somewhat hard  Food Insecurity: No Food Insecurity  . Worried About Charity fundraiser in the Last Year: Never true  . Ran Out of Food in the Last Year: Never true  Transportation Needs: Unmet Transportation Needs  . Lack of Transportation (Medical): Yes  . Lack of Transportation (Non-Medical): Yes  Physical Activity: Not on file  Stress: Not on file  Social Connections: Not on file  Intimate Partner Violence: Not on file    Family History:   Family History  Problem Relation Age of Onset  . Hypertension Father      ROS:  Please see the history of present illness.  All other ROS reviewed and negative.     Physical Exam/Data:   Vitals:    12/18/20 0700 12/18/20 0800 12/18/20 0857 12/18/20 0900  BP: 106/76 115/81  134/89  Pulse: 99 95  95  Resp: _0 Temp:   98.2 F (36.8 C)   TempSrc:   Oral   SpO2: 100% 99%  96%  Weight:      Height:        Intake/Output Summary (Last 24 hours) at 12/18/2020 0918 Last data filed at 12/18/2020 0900 Gross per 24 hour  Intake 1843.25 ml  Output 2000 ml  Net -156.75 ml   Last 3 Weights 12/18/2020 12/17/2020 12/16/2020  Weight (lbs) 235 lb 14.4 oz 236 lb 1.6 oz 238 lb 8.6 oz  Weight (kg) 107.004 kg 107.094 kg 108.2 kg     Body mass index is 31.12 kg/m.  General:  Well nourished, well developed, in no acute distress HEENT: normal Lymph: no adenopathy Neck: no JVD Endocrine:  No thryomegaly Vascular: No carotid bruits  Cardiac:  irreg-irreg; soft SM, no gallops or rubs Lungs:  CTA b/l, no wheezing, rhonchi or rales  Abd: soft, non tender, not disctended  Ext: no edema Musculoskeletal:  No deformities Skin: warm and dry  Neuro:  No gross focal abnormalities noted Psych:  Normal affect   EKG:  The EKG was personally reviewed and demonstrates:    1st: AFib 168bpm, no ST changes AFib 132 12/10/20 baseline interferance, looks AFlutter, V rate 89bpm, A rate 166bpm 12/13/20 SR 65bpm, PACs, QT read as 552, is  Flat and difficultto assess 5/21L SR 79, , PACs, blocked PACs 12/17/20, Atypical aflutter ?  less likely ATach, A rate 166-176, V rate 93 Today same, V rate 97  Telemetry:  Telemetry was personally reviewed and demonstrates:   AFlutter 90's he is having intermittent sinus beats and at times more a course AFib looking rhythm  Relevant CV Studies:  RHC 12/16/20: Hemodynamics (mmHg) RA mean 10 RV 35/10 PA 36/16, mean 24 PCWP mean 14 Oxygen saturations: PA 57% AO 100% Cardiac Output (Fick) 6.43  Cardiac Index (Fick) 2.77 PVR 1.6 WU Cardiac Output (Thermo) 4.85  Cardiac Index (Thermo) 2.09  12/09/20; TEE Moderately dilated left ventricle with EF 25%, diffuse  hypokinesis.  Normal wall thickness.  Normal RV size with moderately decreased systolic function.  Mild right atrial enlargement.  Moderate left atrial enlargement with no LA appendage thrombus.  No PFO/ASD by color doppler.  There was moderate-severe tricuspid regurgitation.  Peak RV-RA gradient 36 mmHg.  There was moderate central mitral regurgitation, appeared functional.  Trileaflet aortic valve with no AS, trivial aortic insufficiency.  Normal caliber thoracic aorta with minimal plaque.      Echo in 2016 with EF 25-30%.  - Echo (2/18): EF 20-25%, moderate MR.  - Echo (10/19): EF 55-60% - Echo (5/22): EF 25-30%, moderate LV dilation, mild LVH, moderate RVE with moderately decreased RV systolic function, severe biatrial enlargement, moderate-severe TR, IVC dilated.  2. Atrial fibrillation: Paroxysmal.    12/03/20: TTE IMPRESSIONS  1. Left ventricular ejection fraction, by estimation, is 25 to 30%. The  left ventricle has severely decreased function. The left ventricle  demonstrates global hypokinesis. The left ventricular internal cavity size  was moderately dilated. There is mild  eccentric left ventricular hypertrophy. Left ventricular diastolic  function could not be evaluated. There is the interventricular septum is  flattened in diastole ('D' shaped left ventricle), consistent with right  ventricular volume overload.  2. Right ventricular systolic function is moderately reduced. The right  ventricular size is moderately enlarged. There is mildly elevated  pulmonary artery systolic pressure.  3. Left atrial size was severely dilated. (Measured 24m) 4. Right atrial size was severely dilated.  5. Mitral insufficiency appears to be at least mild-to-moderate, probably  worse, in the parasternal long axis view. Very little mitral insufficiency  is seen on the apical views. The mitral valve is normal in structure,  albeit with annular dilation and  some degree of tenting due to LV  remodeling.  6. Tricuspid valve regurgitation is moderate to severe.  7. The aortic valve is tricuspid. Aortic valve regurgitation is not  visualized. No aortic stenosis is present.  8. There is borderline dilatation of the ascending aorta, measuring 38  mm.  9. The inferior vena cava is dilated in size with <50% respiratory  variability, suggesting right atrial pressure of 15 mmHg.   Laboratory Data:  High Sensitivity Troponin:   Recent Labs  Lab 12/02/20 0914 12/02/20 1115  TROPONINIHS 28* 30*     Chemistry Recent Labs  Lab 12/16/20 0454 12/16/20 1212 12/17/20 0358 12/18/20 0439  NA 139 141  141 136 140  K 3.7 3.9  3.9 3.7 4.0  CL 101  --  95* 101  CO2 31  --  28 30  GLUCOSE 87  --  321* 134*  BUN 39*  --  38* 38*  CREATININE 3.06*  --  2.68* 2.74*  CALCIUM 9.0  --  8.6* 9.4  GFRNONAA 22*  --  25* 25*  ANIONGAP 7  --  13 9    Recent Labs  Lab 12/16/20 0454  12/17/20 0358 12/18/20 0439  ALBUMIN 2.6* 2.6* 2.8*   Hematology Recent Labs  Lab 12/16/20 0454 12/16/20 1212 12/17/20 0358 12/18/20 0439  WBC 8.5  --  7.7 8.8  RBC 3.47*  --  3.29* 3.74*  HGB 8.2* 9.9*  9.9* 7.8* 9.1*  HCT 25.4* 29.0*  29.0* 24.6* 28.5*  MCV 73.2*  --  74.8* 76.2*  MCH 23.6*  --  23.7* 24.3*  MCHC 32.3  --  31.7 31.9  RDW 19.9*  --  20.0* 21.2*  PLT 177  --  190 229   BNPNo results for input(s): BNP, PROBNP in the last 168 hours.  DDimer No results for input(s): DDIMER in the last 168 hours.   Radiology/Studies:  CARDIAC CATHETERIZATION  Result Date: 12/16/2020 1. Normal PCWP, mildly elevated RA pressure. 2. Borderline pulmonary venous hypertension. 3. Relatively preserved cardiac output off milrinone.     Assessment and Plan:   1. Paroxysmal AFib 2. New looks atypical Aflutter      CHA2DS2Vasc is 3, on heparin gtt here (Eliquis out patient)     AFib on arrival > amio gtt, heparin gtt > DCCV 12/09/20 >> SR > Aflutter  Remains on amio and heparin gtts I have  discussed his AFib flutter with him He was completely asymptomatic from a rhythm perspective, nvere had any palpitations or cardiac awareness at home or now. When I first went intothe room looked Afib, while I was with him having some sinus beats as well. Kevin [name] get another EKG  Discussed perhaps ablation for flutter to try and reduce his arrhythmia burden, and rational for it. Discussed RVR is feltto be the cause of his heart muscle weakening. He at least for today does not want to pursue any procedures, he would be more open to the thought out patient down the road, but not today and not during this stay. DCCV not an option with some sinus noted even this morning.   3. Acute on chronic CHF 4. Low output failure (bive)     NICM goes back years with improvement in his LVEF by outpatient echo done 2019     admitted with gross volume OL     Recurrent marked reduction in LVEF felt to be tachy-mediated, less likely ischemic, LHC not pursued with AKI  Off pressors for days Off CVVH since 5/20 On midodrine torsemide      4. AKI     CVVH >> off 12/13/20     Creat with wobble trend back upwards   I Kevin [name] review the case with Dr. Curt [name], tentatively held NPO for possible ablation today I suspect his AFlutter is atypical Dr. Curt [name] Kevin [name] see him later this morning He does not want an ablation aty least today and Kevin [name] resume his diet   Risk Assessment/Risk Scores:    For questions or updates, please contact [place] Please consult [email or web address] for contact info under    Signed, Baldwin Jamaica, PA-C  12/18/2020 9:18 AM  I have seen and examined this patient with Kevin [name].  Agree with above, note added to reflect my findings.  On exam, tachycardic, irregular.  Patient was admitted to the hospital 12/02/2020 with heart failure.  He has atrial fibrillation and atrial flutter.  His atrial fibrillation was rapid and he was loaded on amiodarone and cardioverted.  Unfortunately since  cardioversion he has been going in and out of atrial flutter quite often.  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  In discussion with the patient, he would like to avoid procedures at this time.  If he does agree to ablation, he would be a good candidate for both atrial fibrillation and atrial flutter ablation in the future.  Please do not hesitate to refer back if he agrees to the procedure.  Kevin [name] M. Kevin Morrish MD 12/18/2020 2:33 PM

## 2020-12-18 NOTE — Progress Notes (Addendum)
Nephrology Progress Note:    Subjective:  had 1.8 liters UOP over 5/24.  Urology was reconsulted for recent gross hematuria with clots which began after in/out cath per nursing note.  He declined urologic intervention or catheterization and he will need cysto as outpatient if amenable.  He states that prior to admission he took aleve a few times weekly and we discussed to avoid this and other common NSAID's; recommended tylenol for pain.  Has 4 liters oxygen running and satting 95% on room air on arrival.  States he got blood within the past 24 hours.  States no more blood in urine or per rectum.   Review of systems:    Denies shortness of breath or chest pain  Denies n/v States urinating without difficulty   Objective Vital signs in last 24 hours: Vitals:   12/17/20 2300 12/18/20 0000 12/18/20 0009 12/18/20 0520  BP: (!) 132/92 (!) 127/59    Pulse: 98 86    Resp: 16 16    Temp:   98.2 F (36.8 C)   TempSrc:   Oral   SpO2: 95% 93%    Weight:    107 kg  Height:       Weight change: -0.091 kg  Intake/Output Summary (Last 24 hours) at 12/18/2020 0532 Last data filed at 12/18/2020 0500 Gross per 24 hour  Intake 1640.24 ml  Output 2050 ml  Net -409.76 ml    Labs: Basic Metabolic Panel: Recent Labs  Lab 12/16/20 0454 12/16/20 1212 12/17/20 0358 12/18/20 0439  NA 139 141  141 136 140  K 3.7 3.9  3.9 3.7 4.0  CL 101  --  95* 101  CO2 31  --  28 30  GLUCOSE 87  --  321* 134*  BUN 39*  --  38* 38*  CREATININE 3.06*  --  2.68* 2.74*  CALCIUM 9.0  --  8.6* 9.4  PHOS 5.0*  --  4.7* 5.1*   Liver Function Tests: Recent Labs  Lab 12/16/20 0454 12/17/20 0358 12/18/20 0439  ALBUMIN 2.6* 2.6* 2.8*   CBC: Recent Labs  Lab 12/14/20 0845 12/15/20 0324 12/16/20 0454 12/16/20 1212 12/17/20 0358 12/18/20 0439  WBC 9.1 11.0* 8.5  --  7.7 8.8  HGB 8.5* 8.1* 8.2* 9.9*  9.9* 7.8* 9.1*  HCT 26.2* 25.5* 25.4* 29.0*  29.0* 24.6* 28.5*  MCV 73.4* 73.7* 73.2*  --  74.8*  76.2*  PLT 144* 166 177  --  190 229   Cardiac Enzymes: No results for input(s): CKTOTAL, CKMB, CKMBINDEX, TROPONINI in the last 168 hours. CBG: Recent Labs  Lab 12/16/20 2051 12/17/20 0635 12/17/20 1122 12/17/20 1634 12/18/20 0007  GLUCAP 120* 106* 121* 121* 113*   Studies/Results: CARDIAC CATHETERIZATION  Result Date: 12/16/2020 1. Normal PCWP, mildly elevated RA pressure. 2. Borderline pulmonary venous hypertension. 3. Relatively preserved cardiac output off milrinone.   Medications: Infusions: . sodium chloride    . amiodarone 30 mg/hr (12/18/20 0000)  . heparin 1,650 Units/hr (12/18/20 0000)    Scheduled Medications: . (feeding supplement) PROSource Plus  30 mL Oral BID BM  . sodium chloride   Intravenous Once  . atorvastatin  10 mg Oral Daily  . B-complex with vitamin C  1 tablet Oral Daily  . Chlorhexidine Gluconate Cloth  6 each Topical Daily  . insulin aspart  0-5 Units Subcutaneous QHS  . insulin aspart  0-9 Units Subcutaneous TID WC  . loratadine  10 mg Oral Daily  . midodrine  10 mg  Oral TID WC  . montelukast  10 mg Oral QHS  . pantoprazole  40 mg Oral Daily  . senna-docusate  1 tablet Oral BID  . sodium chloride flush  10-40 mL Intracatheter Q12H  . torsemide  40 mg Oral Daily    have reviewed scheduled and prn medications.  Physical Exam:  General adult male in bed in no acute distress HEENT normocephalic atraumatic extraocular movements intact sclera anicteric Neck supple trachea midline Lungs clear to auscultation bilaterally unlabored  Heart S1S2 no rub Abdomen soft nontender nondistended/obese habitus Extremities no lower extremity edema; legs wrapped; trace LUE edema Psych normal mood and affect Neuro alert & oriented and answers questions gu - no foley in place Access bandage over prior dialysis catheter site; catheter is out   Assessment/ Plan: Pt is a 65 y.o. yo male -  crt 1.11 a year ago, who was admitted on 12/02/2020 with Afib and  volume overload.   Initial crt 1.7 but increased in the setting of attempted diuresis and continued hemodyanamic instability  -  CRRT 5/12 - 5/20  1. Cardiomyopathy - TEE with EF of 25%, moderately decreased RV function, mitral and tricuspid regurgitation.  Heart failure managing.   2. AKI on CKD - baseline between 1.2-1.75 (1.75 on admit). Likely cardiorenal syndrome with ATN related to hemodynamic instability. CRRT started given volume overload 5/12-5/20.  Note hx of gross hematuria resolved.  Note home meds include the NSAID naproxen as well as lasix, entresto, spironolactone - nontunneled dialysis catheter is out - note torsemide restarted 5/23 per CHF  - address gross hematuria as below - continue supportive measures   3. Fluid volume overload - improved s/p RRT; weight down quite a bit.  On torsemide 40 mg daily starting 5/22.  4.  A fib control per primary; on amio  5. Anemia microcytic- some maroon stools this hospitalization per charting.  Iron deficiency s/p feraheme 5/15.  Redosed feraheme 5/23.  aranesp 40 mcg on 5/24. S/p PRBC's  6. HTN  Charted as hx of HTN.  Now on midodrine.   7. Gross hematuria with clots - appreciate urology - patient will need cysto if he is agreeable; he has thus far declined intervention    Estanislado Emms, MD 12/18/2020, 5:48 AM

## 2020-12-18 NOTE — Progress Notes (Signed)
Physical Therapy Treatment Patient Details Name: Kevin [name] MRN: [id number] DOB: Dec 07, 1955 Today's Date: 12/18/2020    History of Present Illness 65 y.o. admitted 5/9 with Afib and volume overload with acute on chronic CHF. CRRT started 5/12. 5/16 TEE with DCCV. PMhx: combined CHF, NICM, non-obstructive CAD, paroxysmal atrial fibrillation, moderate mitral regurgitation, HTN, DM type 2, COPD, tobacco abuse    PT Comments    Pt demonstrates ability to perform transfers and ambulation without the need for physical assistance. Pt tolerates gait for household distances without use of an AD, limited by fatigue and concern for urinary incontinence with mobility. Pt reports feelings of instability and LE weakness during gait and requests to rest, declining further attempts at ambulation. Pt demonstrates deficits in strength, endurance, power, activity tolerance, and balance and will continue to benefit from acute PT to improve tolerance to mobility.    Follow Up Recommendations  No PT follow up     Equipment Recommendations  None recommended by PT    Recommendations for Other Services       Precautions / Restrictions Precautions Precautions: Fall;Other (comment) Precaution Comments: watch HR with Afib Restrictions Weight Bearing Restrictions: No    Mobility  Bed Mobility               General bed mobility comments: Pt received in recliner.    Transfers Overall transfer level: Independent Equipment used: None Transfers: Sit to/from Stand (x4) Sit to Stand: Independent         General transfer comment: Pt HR up to 147 with standing, nurse in room and taking pulse. Pt asymptomatic and returns to sitting with HR fluctuating between 112-125.  Ambulation/Gait Ambulation/Gait assistance: Supervision Gait Distance (Feet): 50 Feet Assistive device: None Gait Pattern/deviations: Step-through pattern;Decreased stride length Gait velocity: decreased Gait velocity  interpretation: 1.31 - 2.62 ft/sec, indicative of limited community ambulator General Gait Details: pt concerned with urinary urgerncy and uncertain of incontinence during gait. pt reports feelings of instability during standing and gait.   Stairs             Wheelchair Mobility    Modified Rankin (Stroke Patients Only)       Balance Overall balance assessment: Needs assistance Sitting-balance support: Feet supported;No upper extremity supported Sitting balance-Leahy Scale: Fair     Standing balance support: No upper extremity supported;During functional activity Standing balance-Leahy Scale: Fair Standing balance comment: Pt does not rely on UE support to maintain standing balance, however pt reports feeling wobbly on his feet.                            Cognition Arousal/Alertness: Awake/alert Behavior During Therapy: WFL for tasks assessed/performed Overall Cognitive Status: Within Functional Limits for tasks assessed                                        Exercises      General Comments General comments (skin integrity, edema, etc.): Pt HR fluctuating between high 90s and low 100s at rest. Pt HR up to 147 with standing, with patient denying symptoms. Pt HR between 110-130 during gait.      Pertinent Vitals/Pain Pain Assessment: No/denies pain    Home Living                      Prior Function  PT Goals (current goals can now be found in the care plan section) Acute Rehab PT Goals Patient Stated Goal: return home Progress towards PT goals: Progressing toward goals    Frequency    Min 3X/week      PT Plan Current plan remains appropriate    Co-evaluation              AM-PAC PT "6 Clicks" Mobility   Outcome Measure  Help needed turning from your back to your side while in a flat bed without using bedrails?: None Help needed moving from lying on your back to sitting on the side of a flat bed  without using bedrails?: None Help needed moving to and from a bed to a chair (including a wheelchair)?: None Help needed standing up from a chair using your arms (e.g., wheelchair or bedside chair)?: None Help needed to walk in hospital room?: A Little Help needed climbing 3-5 steps with a railing? : A Little 6 Click Score: 22    End of Session Equipment Utilized During Treatment: Gait belt Activity Tolerance: Patient limited by fatigue Patient left: in chair;with call bell/phone within reach Nurse Communication: Mobility status PT Visit Diagnosis: Unsteadiness on feet (R26.81);Other abnormalities of gait and mobility (R26.89)     Time: 0272-5366 PT Time Calculation (min) (ACUTE ONLY): 29 min  Charges:  $Gait Training: 8-22 mins $Therapeutic Activity: 8-22 mins                     Acute Rehab  Pager: [phone]    Waldemar Dickens, SPT  12/18/2020, 4:17 PM

## 2020-12-18 NOTE — Progress Notes (Addendum)
Patient ID: Kevin [name], male   DOB: 01-17-1956, 65 y.o.   MRN: [id number]     Advanced Heart Failure Rounding Note  PCP-Cardiologist: Chrystie Nose, MD   Subjective:    - CVVH started 5/12 with intractable volume overload.   - TEE/DCCV>>NSR 5/16 - CVVH stopped 5/20 - 5/23  RHC with preserved cardiac output, normal pcwp, and mildly RA pressure. Had urinary retention with hematuria - 5/24 Urology recommended outpatient follow up.   - On amiodarone 30 mg/hr and heparin gtt.    Creatinine trending down 3.06> 2.68 > 2.7     CO-OX stable 64%.  CVP 5   Denies SOB. Feels ok.   RHC 12/16/20: Hemodynamics (mmHg) RA mean 10 RV 35/10 PA 36/16, mean 24 PCWP mean 14 Oxygen saturations: PA 57% AO 100% Cardiac Output (Fick) 6.43  Cardiac Index (Fick) 2.77 PVR 1.6 WU Cardiac Output (Thermo) 4.85  Cardiac Index (Thermo) 2.09  Objective:   Weight Range: 107 kg Body mass index is 31.12 kg/m.   Vital Signs:   Temp:  [98 F (36.7 C)-98.9 F (37.2 C)] 98 F (36.7 C) (05/25 0400) Pulse Rate:  [43-128] 98 (05/25 0600) Resp:  [13-26] 13 (05/25 0600) BP: (107-160)/(59-111) 107/74 (05/25 0600) SpO2:  [93 %-100 %] 93 % (05/25 0600) Weight:  [107 kg] 107 kg (05/25 0520) Last BM Date: 12/15/20  Weight change: Filed Weights   12/16/20 0500 12/17/20 0616 12/18/20 0520  Weight: 108.2 kg 107.1 kg 107 kg    Intake/Output:   Intake/Output Summary (Last 24 hours) at 12/18/2020 0749 Last data filed at 12/18/2020 0600 Gross per 24 hour  Intake 1746.04 ml  Output 2000 ml  Net -253.96 ml      Physical Exam  CVP 5  General:   No resp difficulty HEENT: normal Neck: supple. no JVD. Carotids 2+ bilat; no bruits. No lymphadenopathy or thryomegaly appreciated. Cor: PMI nondisplaced. Regular rate & rhythm. No rubs, gallops or murmurs. Lungs: clear Abdomen: soft, nontender, nondistended. No hepatosplenomegaly. No bruits or masses. Good bowel sounds. Extremities: no cyanosis, clubbing,  rash, edema. RUE PICC Neuro: alert & orientedx3, cranial nerves grossly intact. moves all 4 extremities w/o difficulty. Affect pleasant   Telemetry  In and out NSR / A flutter.   EKG A flutter 2:1 90s   Labs    CBC Recent Labs    12/17/20 0358 12/18/20 0439  WBC 7.7 8.8  HGB 7.8* 9.1*  HCT 24.6* 28.5*  MCV 74.8* 76.2*  PLT 190 229   Basic Metabolic Panel Recent Labs    31/51/76 0358 12/18/20 0439  NA 136 140  K 3.7 4.0  CL 95* 101  CO2 28 30  GLUCOSE 321* 134*  BUN 38* 38*  CREATININE 2.68* 2.74*  CALCIUM 8.6* 9.4  MG 2.0 1.8  PHOS 4.7* 5.1*   Liver Function Tests Recent Labs    12/17/20 0358 12/18/20 0439  ALBUMIN 2.6* 2.8*   No results for input(s): LIPASE, AMYLASE in the last 72 hours. Cardiac Enzymes No results for input(s): CKTOTAL, CKMB, CKMBINDEX, TROPONINI in the last 72 hours.  BNP: BNP (last 3 results) Recent Labs    12/02/20 0915  BNP 1,158.5*    ProBNP (last 3 results) No results for input(s): PROBNP in the last 8760 hours.   D-Dimer No results for input(s): DDIMER in the last 72 hours. Hemoglobin A1C No results for input(s): HGBA1C in the last 72 hours. Fasting Lipid Panel No results for input(s): CHOL, HDL, LDLCALC, TRIG, CHOLHDL,  LDLDIRECT in the last 72 hours. Thyroid Function Tests No results for input(s): TSH, T4TOTAL, T3FREE, THYROIDAB in the last 72 hours.  Invalid input(s): FREET3  Other results:   Imaging    No results found.   Medications:     Scheduled Medications: . (feeding supplement) PROSource Plus  30 mL Oral BID BM  . sodium chloride   Intravenous Once  . atorvastatin  10 mg Oral Daily  . B-complex with vitamin C  1 tablet Oral Daily  . Chlorhexidine Gluconate Cloth  6 each Topical Daily  . insulin aspart  0-5 Units Subcutaneous QHS  . insulin aspart  0-9 Units Subcutaneous TID WC  . loratadine  10 mg Oral Daily  . midodrine  10 mg Oral TID WC  . montelukast  10 mg Oral QHS  . pantoprazole  40  mg Oral Daily  . senna-docusate  1 tablet Oral BID  . sodium chloride flush  10-40 mL Intracatheter Q12H  . torsemide  40 mg Oral Daily    Infusions: . sodium chloride    . amiodarone 30 mg/hr (12/18/20 0600)  . heparin 1,650 Units/hr (12/18/20 0600)  . magnesium sulfate bolus IVPB      PRN Medications: Place/Maintain arterial line **AND** sodium chloride, acetaminophen, ondansetron (ZOFRAN) IV, sodium chloride flush    Assessment/Plan   1. Acute on chronic systolic CHF: Nonischemic cardiomyopathy.  Echo this admission with EF 25-30%, moderate LV dilation, mild LVH, moderate RVE with moderately decreased RV systolic function, severe biatrial enlargement, moderate-severe TR, IVC dilated. EF was low starting in 2016, cath at that time showed mild nonobstructive CAD.  Patient had runs of AF but not sustained when initial diagnosis was made.  Was readmitted in 2018 with persistent AF and CHF, echo with EF 20-25%.  Had DCCV back to NSR at that time.  EF up to 55-60% in 2019.  Lost to followup, now back with progressive dyspnea/volume overload and EF back down in setting of AF with RVR.  Difficult to identify initial etiology of CMP but cannot rule out tachy-mediated.  UDS negative for cocaine. 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  He does not feel AF and we are not sure how long he has been in it.  He was 30+ lbs up with marked volume overload and concern for low output HF (progressive rise in creatinine with attempts at diuresis).  Poor response to initiation of milrinone 0.25 then norepinephrine, creatinine continued to rise and CVVH started 5/12. CVVH stopped 5/20 with good UOP on IV Lasix.  In and out of NSR. CO-OX 69%.   -CVP 5. Cut back torsemide to 20 mg daily.   -Hold off on coronary angiography with renal dysfunction.   - Want to maintain NSR, continue amiodarone.  - Consider eventual cardiac MRI.  - Unna boots.  - BP stable, continue midodrine to 10 mg  tid.   - BP soft no room for bb with low output.  2. Atrial fibrillation/flutter: Admitted with afib/RVR, uncertain how long this has been present.  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 S/p TEE/DCCV 5/16. Has been in and out of atrial fibrillation/flutter.  - Continue amiodarone gtt at 30 mg/hr for now.  - EKG this morning --A Flutter this morning.   Consult EP. ? Ablation candidate.  - He will also need outpatient sleep study.  - On heparin drip.  3. AKI: Uncertain baseline, creatinine 1.77 at arrival, creatinine up to 3.49 with poor diuresis and CVVH started with  intractable volume overload. Now off CVVH.  Creatinine 3.06  > 2.7 >2.7  - Nephrology appreciated.  - Follow creatinine closely, hopefully will stabilize soon.   4. Hematuria: Seen by urology => think old blood in urine, renal ultrasound ok.  Recommend continuing heparin gtt.   Recurrent hematuria.  He refuses catheriation. Urology saw and recommend outpatient follow up.  5. Iron-deficieny anemia: Maroon stool has been seen. Hgb up after blood 7.8 >9.1  Had hematuria 5/23   - received feraheme  - Follow closely, transfuse hgb < 8.   6. Deconditioning: Consult PT. OOB.   Transer to 2 C. EP consulted.   Length of Stay: 16  Amy Clegg, NP  12/18/2020, 7:49 AM  Advanced Heart Failure Team Pager [phone] (M-F; 7a - 5p)  Please contact [facility] for night-coverage after hours (5p -7a ) and weekends on [email or web address]  Patient seen with NP, agree with the above note.   Patient remains in atrial flutter on amiodarone 30 mg/hr, he will convert transiently to NSR.   Co-ox 64% off inotropes.   CVP 5, currently on torsemide 40 mg daily with BUN/creatinine stable. Nephrology is following.   BP stable on midodrine 10 mg tid.  General: NAD Neck: No JVD, no thyromegaly or thyroid nodule.  Lungs: Clear to auscultation bilaterally with normal respiratory effort. CV: Nondisplaced PMI.  Heart irregular S1/S2, no S3/S4, no  murmur.  Trace ankle edema.  Abdomen: Soft, nontender, no hepatosplenomegaly, no distention.  Skin: Intact without lesions or rashes.  Neurologic: Alert and oriented x 3.  Psych: Normal affect. Extremities: No clubbing or cyanosis.  HEENT: Normal.   Patient is in and out of atrial flutter. Continue heparin gtt and amiodarone 30 mg/hr.  Would hold off on ranolazine with marginal renal function.  Has only had flutter since cardioversion last week, had AF prior. - EP to see.  ?Aflutter ablation now and fibrillation ablation down the road.  He has been only AFL and NSR since DCCV.  Amiodarone may be able to hold him in NSR with flutter ablation.   CVP 5 today with good UOP with torsemide 40 mg daily. Creatinine/BUN stable.  - Decrease torsemide to 20 mg daily - Renal following, hopefully can avoid HD.  - Continue midodrine at current level, BP stable.   Co-ox good off inotropes.   OK for step down.   Marca Ancona 12/18/2020 8:40 AM  EP saw patient and offered ablation.  However, he thinks that his heart will return to NSR on its own.  I told him that this does not seem likely and ablation is our best chance to keep his heart in rhythm and get EF recovery, but he is insistent that he wants to try exercise.  Therefore, no ablation planned.    Will stop heparin gtt, start Eliquis.   Increase amiodarone to 60 mg/hr over the next day to see if we can get him to convert.   Marca Ancona 12/18/2020 11:58 AM

## 2020-12-18 NOTE — Plan of Care (Signed)
  Problem: Education: Goal: Ability to verbalize understanding of medication therapies will improve Outcome: Progressing   Problem: Activity: Goal: Capacity to carry out activities will improve Outcome: Progressing   Problem: Education: Goal: Knowledge of General Education information will improve Description: Including pain rating scale, medication(s)/side effects and non-pharmacologic comfort measures Outcome: Progressing   Problem: Clinical Measurements: Goal: Will remain free from infection Outcome: Progressing Goal: Respiratory complications will improve Outcome: Progressing   Problem: Nutrition: Goal: Adequate nutrition will be maintained Outcome: Progressing

## 2020-12-18 NOTE — Plan of Care (Signed)

## 2020-12-18 NOTE — Progress Notes (Signed)
Nutrition Follow-up  DOCUMENTATION CODES:   Not applicable  INTERVENTION:   - Continue ProSource Plus 30 ml po BID, each supplement provides 100 kcal and 15 grams of protein  - Add Ensure Enlive po daily, each supplement provides 350 kcal and 20 grams of protein  - Encourage adequate PO intake  NUTRITION DIAGNOSIS:   Increased nutrient needs related to acute illness as evidenced by estimated needs.  Ongoing, being addressed via supplements  GOAL:   Patient will meet greater than or equal to 90% of their needs  Progressing  MONITOR:   PO intake,Supplement acceptance,Labs,Weight trends  REASON FOR ASSESSMENT:   Rounds    ASSESSMENT:   65 yo male admitted with acute on chronic CHF, AKI requiring RRT. PMH includes COPD, CAD, CHF, HTN, AKI, CKD  5/12 - CRRT initiated 2/2 massive volume overload 5/16 - TEE, cardioversion 5/20 - CRRT discontinued 5/23 - RHC  Noted EP consulted and offered ablation. However, pt has refused and believes that his heart will return to NSR on its own.  Spoke with pt at bedside. Pt reports that he ate almost all of his lunch. He states that he has a good appetite. Pt reports that he is taking the ProSource Plus when it is offered. He is willing to try the Ensure supplements again. He wonders if they are suitable for people with lactose intolerance.  Admit weight: 128.5 kg Current weight: 107 kg  Pt with non-pitting edema to BUE and mild pitting edema to BLE. Weight down 21.5 kg since admission. Pt is net negative 25.5 L since admission.  Meal Completion: 100% x last 3 meals  Medications reviewed and include: ProSource Plus BID, B-complex with vitamin C, SSI, protonix, senna, torsemide, amiodarone gtt  Labs reviewed: BUN 38, creatinine 2.74, phosphorus 5.1, hemoglobin 9.1 CBG's: 97-121 x 24 hours  UOP: 2000 ml x 24 hours I/O's: -25.5 L since admission  Diet Order:   Diet Order            Diet heart healthy/carb modified Room  service appropriate? Yes; Fluid consistency: Thin  Diet effective now                 EDUCATION NEEDS:   Education needs have been addressed  Skin:  Skin Assessment: Reviewed RN Assessment  Last BM:  12/18/20 large type 4  Height:   Ht Readings from Last 1 Encounters:  12/02/20 6\' 1"  (1.854 m)    Weight:   Wt Readings from Last 1 Encounters:  12/18/20 107 kg    BMI:  Body mass index is 31.12 kg/m.  Estimated Nutritional Needs:   Kcal:  2100-2300 kcals  Protein:  105-125 grams  Fluid:  1.8 L    12/20/20, MS, RD, LDN Inpatient Clinical Dietitian Please see [facility] for contact information.

## 2020-12-19 ENCOUNTER — Encounter (HOSPITAL_COMMUNITY)
Admission: EM | Disposition: A | Discharge status: Home / Self Care | Payer: Self-pay | Source: Home / Self Care | Attending: Cardiology

## 2020-12-19 DIAGNOSIS — I5043 Acute on chronic combined systolic (congestive) and diastolic (congestive) heart failure: Secondary | ICD-10-CM | POA: Diagnosis not present

## 2020-12-19 LAB — CBC
HCT: 28 % — ABNORMAL LOW (ref 39.0–52.0)
Hemoglobin: 8.8 g/dL — ABNORMAL LOW (ref 13.0–17.0)
MCH: 24.4 pg — ABNORMAL LOW (ref 26.0–34.0)
MCHC: 31.4 g/dL (ref 30.0–36.0)
MCV: 77.6 fL — ABNORMAL LOW (ref 80.0–100.0)
Platelets: 258 10*3/uL (ref 150–400)
RBC: 3.61 MIL/uL — ABNORMAL LOW (ref 4.22–5.81)
RDW: 21.5 % — ABNORMAL HIGH (ref 11.5–15.5)
WBC: 7.9 10*3/uL (ref 4.0–10.5)
nRBC: 0 % (ref 0.0–0.2)

## 2020-12-19 LAB — GLUCOSE, CAPILLARY
Glucose-Capillary: 127 mg/dL — ABNORMAL HIGH (ref 70–99)
Glucose-Capillary: 125 mg/dL — ABNORMAL HIGH (ref 70–99)
Glucose-Capillary: 107 mg/dL — ABNORMAL HIGH (ref 70–99)
Glucose-Capillary: 87 mg/dL (ref 70–99)
Glucose-Capillary: 103 mg/dL — ABNORMAL HIGH (ref 70–99)

## 2020-12-19 LAB — RENAL FUNCTION PANEL
Albumin: 2.7 g/dL — ABNORMAL LOW (ref 3.5–5.0)
Anion gap: 10 (ref 5–15)
BUN: 37 mg/dL — ABNORMAL HIGH (ref 8–23)
CO2: 30 mmol/L (ref 22–32)
Calcium: 9.4 mg/dL (ref 8.9–10.3)
Chloride: 98 mmol/L (ref 98–111)
Creatinine, Ser: 2.75 mg/dL — ABNORMAL HIGH (ref 0.61–1.24)
GFR, Estimated: 25 mL/min — ABNORMAL LOW (ref >60)
Glucose, Bld: 133 mg/dL — ABNORMAL HIGH (ref 70–99)
Phosphorus: 4.8 mg/dL — ABNORMAL HIGH (ref 2.5–4.6)
Potassium: 3.7 mmol/L (ref 3.5–5.1)
Sodium: 138 mmol/L (ref 135–145)

## 2020-12-19 LAB — COOXEMETRY PANEL
Carboxyhemoglobin: 1 % (ref 0.5–1.5)
Methemoglobin: 1.3 % (ref 0.0–1.5)
O2 Saturation: 59.6 %
Total hemoglobin: 9 g/dL — ABNORMAL LOW (ref 12.0–16.0)

## 2020-12-19 LAB — MAGNESIUM: Magnesium: 2.4 mg/dL (ref 1.7–2.4)

## 2020-12-19 SURGERY — CARDIOVERSION
Anesthesia: General

## 2020-12-19 MED ORDER — MIDODRINE HCL 5 MG PO TABS
5.0000 mg | ORAL_TABLET | Freq: Three times a day (TID) | ORAL | Status: DC
  Administered 2020-12-19 – 2020-12-21 (×6): 5 mg via ORAL
  Filled 2020-12-19 (×6): qty 1

## 2020-12-19 MED ORDER — POTASSIUM CHLORIDE CRYS ER 20 MEQ PO TBCR
40.0000 meq | EXTENDED_RELEASE_TABLET | Freq: Once | ORAL | Status: AC
  Administered 2020-12-19: 40 meq via ORAL
  Filled 2020-12-19: qty 2

## 2020-12-19 MED ORDER — AMIODARONE HCL 200 MG PO TABS
400.0000 mg | ORAL_TABLET | Freq: Two times a day (BID) | ORAL | Status: DC
  Administered 2020-12-19 – 2020-12-21 (×5): 400 mg via ORAL
  Filled 2020-12-19 (×5): qty 2

## 2020-12-19 MED ORDER — TORSEMIDE 20 MG PO TABS
40.0000 mg | ORAL_TABLET | Freq: Every day | ORAL | Status: DC
  Administered 2020-12-19 – 2020-12-20 (×2): 40 mg via ORAL
  Filled 2020-12-19 (×3): qty 2

## 2020-12-19 NOTE — Plan of Care (Signed)
  Problem: Education: Goal: Ability to demonstrate management of disease process will improve Outcome: Progressing Goal: Ability to verbalize understanding of medication therapies will improve Outcome: Progressing   Problem: Activity: Goal: Capacity to carry out activities will improve Outcome: Progressing   Problem: Cardiac: Goal: Ability to achieve and maintain adequate cardiopulmonary perfusion will improve Outcome: Progressing   Problem: Education: Goal: Knowledge of General Education information will improve Description: Including pain rating scale, medication(s)/side effects and non-pharmacologic comfort measures Outcome: Progressing   Problem: Clinical Measurements: Goal: Ability to maintain clinical measurements within normal limits will improve Outcome: Progressing Goal: Will remain free from infection Outcome: Progressing Goal: Diagnostic test results will improve Outcome: Progressing Goal: Cardiovascular complication will be avoided Outcome: Progressing   Problem: Activity: Goal: Risk for activity intolerance will decrease Outcome: Progressing   Problem: Nutrition: Goal: Adequate nutrition will be maintained Outcome: Progressing   Problem: Coping: Goal: Level of anxiety will decrease Outcome: Progressing

## 2020-12-19 NOTE — Progress Notes (Signed)
Occupational Therapy Treatment Patient Details Name: Kevin [name] MRN: [id number] DOB: Dec 13, 1955 Today's Date: 12/19/2020    History of present illness 65 y.o. admitted 5/9 with Afib and volume overload with acute on chronic CHF. CRRT started 5/12. 5/16 TEE with DCCV. PMhx: combined CHF, NICM, non-obstructive CAD, paroxysmal atrial fibrillation, moderate mitral regurgitation, HTN, DM type 2, COPD, tobacco abuse   OT comments  Pt progressing well towards OT goals, remains pleasant and motivated to participate. Pt able to complete LB dressing and tub transfer simulations x 3 with no more than setup assist. Pt Independent with mobility in room. After activity, pt endorsed feeling winded with SpO2 at 89% on RA. After seated rest break and pursed lip breathing, improves within 10 sec to 93% on RA. Pt with questions about equipment, relevant activities/exercises and mgmt of CHF once discharged. Provided education on this in relation to ADLs/IADLs, as well as given handouts for energy conservation and CHF. Pt reports feeling comfortable standing for showers at home, so DME recs updated accordingly.   HR WFL throughout, sustained 90s bpm.   Follow Up Recommendations  No OT follow up    Equipment Recommendations  None recommended by OT    Recommendations for Other Services      Precautions / Restrictions Precautions Precautions: Fall;Other (comment) Precaution Comments: watch HR with Afib Restrictions Weight Bearing Restrictions: No       Mobility Bed Mobility               General bed mobility comments: Pt received in recliner.    Transfers Overall transfer level: Independent Equipment used: None Transfers: Sit to/from Stand Sit to Stand: Independent              Balance Overall balance assessment: Needs assistance Sitting-balance support: Feet supported;No upper extremity supported Sitting balance-Leahy Scale: Normal     Standing balance support: No upper  extremity supported;During functional activity Standing balance-Leahy Scale: Good Standing balance comment: Pt does not rely on UE support to maintain standing balance, however pt reports feeling wobbly on his feet.                           ADL either performed or assessed with clinical judgement   ADL Overall ADL's : Needs assistance/impaired             Lower Body Bathing: Set up;Sit to/from stand Lower Body Bathing Details (indicate cue type and reason): Setup for peri care after smear noted on chair pad     Lower Body Dressing: Set up;Sit to/from stand Lower Body Dressing Details (indicate cue type and reason): Setup to doff/don new socks with increased effort. Improving ability to bring LEs to self due to decreased fluid levels.         Tub/ Shower Transfer: Set up;Ambulation;Tub transfer Tub/Shower Transfer Details (indicate cue type and reason): simulated tub transfer with pt able to complete x 3 with setup for lines/safety, no LOB noted and no UE support needed   General ADL Comments: Provided energy conservation handout and educated on focus on SPO2 >90%, seated rest breaks when winded and pursed lip breathing. Provided additional handouts on CHF mgmt with daily tasks.     Vision   Vision Assessment?: No apparent visual deficits   Perception     Praxis      Cognition Arousal/Alertness: Awake/alert Behavior During Therapy: WFL for tasks assessed/performed Overall Cognitive Status: Within Functional Limits for tasks assessed  Exercises     Shoulder Instructions       General Comments Pt received with [place] out of nose, SpO2 WFL at rest. With activity, brief desat to 89% but recovered within 10 seconds seated rest break/pursed lip breathing. HR WFL (sustained 90s) during session    Pertinent Vitals/ Pain       Pain Assessment: No/denies pain  Home Living                                           Prior Functioning/Environment              Frequency  Min 2X/week        Progress Toward Goals  OT Goals(current goals can now be found in the care plan section)  Progress towards OT goals: Progressing toward goals  Acute Rehab OT Goals Patient Stated Goal: return home OT Goal Formulation: With patient Time For Goal Achievement: 12/30/20 Potential to Achieve Goals: Good ADL Goals Pt Will Perform Lower Body Dressing: Independently;sit to/from stand Pt Will Transfer to Toilet: Independently;ambulating Pt Will Perform Tub/Shower Transfer: Tub transfer;with set-up;ambulating Additional ADL Goal #1: Pt to verbalize at least 2 energy conservation strategies to implement during ADLs/IADLs Additional ADL Goal #2: Pt to increase standing tolerance > 7-10 min during ADL/IADL tasks to maximize endurance  Plan Discharge plan remains appropriate;Equipment recommendations need to be updated    Co-evaluation                 AM-PAC OT "6 Clicks" Daily Activity     Outcome Measure   Help from another person eating meals?: None Help from another person taking care of personal grooming?: None Help from another person toileting, which includes using toliet, bedpan, or urinal?: A Little Help from another person bathing (including washing, rinsing, drying)?: A Little Help from another person to put on and taking off regular upper body clothing?: None Help from another person to put on and taking off regular lower body clothing?: A Little 6 Click Score: 21    End of Session    OT Visit Diagnosis: Other (comment) (decreased cardiopulmonary tolerance)   Activity Tolerance Patient tolerated treatment well   Patient Left in chair;with call bell/phone within reach   Nurse Communication Mobility status;Other (comment) (HR, O2 levels)        Time: 7782-4235 OT Time Calculation (min): 27 min  Charges: OT General Charges $OT Visit: 1 Visit OT  Treatments $Self Care/Home Management : 8-22 mins $Therapeutic Activity: 8-22 mins  Bradd Canary, OTR/L Acute Rehab Services Office: [phone]   Lorre Munroe 12/19/2020, 11:26 AM

## 2020-12-19 NOTE — Progress Notes (Signed)
Physical Therapy Treatment Patient Details Name: Kevin [name] MRN: [id number] DOB: 1955/08/12 Today's Date: 12/19/2020    History of Present Illness 65 y.o. admitted 5/9 with Afib and volume overload with acute on chronic CHF. CRRT started 5/12. 5/16 TEE with DCCV. PMhx: combined CHF, NICM, non-obstructive CAD, paroxysmal atrial fibrillation, moderate mitral regurgitation, HTN, DM type 2, COPD, tobacco abuse    PT Comments    Pt received in chair, agreeable to stair negotiation training and limited seated exercises. Pt with good tolerance for BLE strengthening exercises and stairs and needs supervision to min guard for stairs. Pt given HEP handout and encouraged to progress gait distances with rollator trial next session. Pt continues to benefit from PT services to progress toward functional mobility goals. DME recs below pending progression.  Follow Up Recommendations  No PT follow up     Equipment Recommendations  None recommended by PT;Other (comment) (pt interested in rollator, will continue to assess needs pending higher level balance assessment)    Recommendations for Other Services       Precautions / Restrictions Precautions Precautions: Fall;Other (comment) Precaution Comments: watch HR with Afib Restrictions Weight Bearing Restrictions: No    Mobility  Bed Mobility               General bed mobility comments: Pt received/remained in recliner.    Transfers Overall transfer level: Independent Equipment used: None Transfers: Sit to/from Stand Sit to Stand: Independent            Ambulation/Gait Ambulation/Gait assistance: Supervision Gait Distance (Feet): 30 Feet Assistive device: None Gait Pattern/deviations: Step-through pattern;Decreased stride length Gait velocity: decreased   General Gait Details: pt concerned with urinary urgency and defers again hallway gait trial but agreeable to stair training in room;  pt reports no instability with short  distances this date.   Stairs Stairs: Yes Stairs assistance: Min guard Stair Management: With walker;Forwards;Step to pattern Number of Stairs: 10 General stair comments: no LOB, pt using RW to simulate support of wall (pt reports he cannot recall if he has hand rail or not, only has 2 STE but may use door frame?), HR 90's-113 bpm with exertion, no dizziness or SOB   Wheelchair Mobility    Modified Rankin (Stroke Patients Only)       Balance Overall balance assessment: Needs assistance Sitting-balance support: Feet supported;No upper extremity supported Sitting balance-Leahy Scale: Normal     Standing balance support: No upper extremity supported;During functional activity Standing balance-Leahy Scale: Good Standing balance comment: Pt does not rely on UE support to maintain standing balance, however pt reports feeling less steady than baseline/quicker to fatigue                            Cognition Arousal/Alertness: Awake/alert Behavior During Therapy: WFL for tasks assessed/performed Overall Cognitive Status: Within Functional Limits for tasks assessed                                 General Comments: pt reluctant to progress gait outside of room and c/o frequent urination (RN notified pt urine appears orange to reddish color in primofit canister on wall), somewhat anxious.      Exercises General Exercises - Lower Extremity Long Arc Quad: AROM;Both;10 reps;Seated Hip Flexion/Marching: AROM;Both;Seated;10 reps Other Exercises Other Exercises: chair push-ups x10 reps with cues for pacing/safety Other Exercises: HEP handout given: (link: [email or web address] Access  Code: [email or web address] including energy conservation info)    General Comments General comments (skin integrity, edema, etc.): HR max 113 bpm post-flight of stairs, WNL during chair push-ups and seated exercise      Pertinent Vitals/Pain Pain Assessment: No/denies pain    Home  Living                      Prior Function            PT Goals (current goals can now be found in the care plan section) Acute Rehab PT Goals Patient Stated Goal: return home PT Goal Formulation: With patient Time For Goal Achievement: 12/27/20 Potential to Achieve Goals: Good Progress towards PT goals: Progressing toward goals    Frequency    Min 3X/week      PT Plan Current plan remains appropriate    Co-evaluation              AM-PAC PT "6 Clicks" Mobility   Outcome Measure  Help needed turning from your back to your side while in a flat bed without using bedrails?: None Help needed moving from lying on your back to sitting on the side of a flat bed without using bedrails?: None Help needed moving to and from a bed to a chair (including a wheelchair)?: None Help needed standing up from a chair using your arms (e.g., wheelchair or bedside chair)?: None Help needed to walk in hospital room?: A Little Help needed climbing 3-5 steps with a railing? : A Little 6 Click Score: 22    End of Session Equipment Utilized During Treatment: Gait belt Activity Tolerance: Patient limited by fatigue Patient left: in chair;with call bell/phone within reach Nurse Communication: Mobility status;Other (comment) (urine reddish to orange color) PT Visit Diagnosis: Unsteadiness on feet (R26.81);Other abnormalities of gait and mobility (R26.89)     Time: 1440-1459 PT Time Calculation (min) (ACUTE ONLY): 19 min  Charges:  $Therapeutic Exercise: 8-22 mins                     Diarra Ceja P., PTA Acute Rehabilitation Services Pager: [phone] Office: [phone]   Angus Palms 12/19/2020, 5:23 PM

## 2020-12-19 NOTE — Progress Notes (Signed)
Patient ID: Kevin [name], male   DOB: 18-Nov-1955, 65 y.o.   MRN: [id number]     Advanced Heart Failure Rounding Note  PCP-Cardiologist: Chrystie Nose, MD   Subjective:    - CVVH started 5/12 with intractable volume overload.   - TEE/DCCV>>NSR 5/16 - CVVH stopped 5/20 - 5/23  RHC with preserved cardiac output, normal pcwp, and mildly RA pressure. Had urinary retention with hematuria - 5/24 Urology recommended outpatient follow up.   On amiodarone 60 mg/hr and heparin gtt.  He is in NSR in 60s this morning with PACs.   Creatinine stable 2.75.    CO-OX stable 60%.  CVP 10-11 on torsemide 20 daily.   Denies SOB. Feels ok.   RHC 12/16/20: Hemodynamics (mmHg) RA mean 10 RV 35/10 PA 36/16, mean 24 PCWP mean 14 Oxygen saturations: PA 57% AO 100% Cardiac Output (Fick) 6.43  Cardiac Index (Fick) 2.77 PVR 1.6 WU Cardiac Output (Thermo) 4.85  Cardiac Index (Thermo) 2.09  Objective:   Weight Range: 111.2 kg Body mass index is 32.34 kg/m.   Vital Signs:   Temp:  [97.6 F (36.4 C)-98.6 F (37 C)] 98.4 F (36.9 C) (05/26 0726) Pulse Rate:  [55-104] 94 (05/26 0726) Resp:  [12-21] 15 (05/26 0726) BP: (105-145)/(70-119) 105/85 (05/26 0726) SpO2:  [90 %-100 %] 94 % (05/26 0726) Weight:  [111.2 kg] 111.2 kg (05/26 0300) Last BM Date: 12/15/20  Weight change: Filed Weights   12/17/20 0616 12/18/20 0520 12/19/20 0300  Weight: 107.1 kg 107 kg 111.2 kg    Intake/Output:   Intake/Output Summary (Last 24 hours) at 12/19/2020 0800 Last data filed at 12/19/2020 0500 Gross per 24 hour  Intake 149.59 ml  Output 2000 ml  Net -1850.41 ml      Physical Exam  CVP 10-11  General: NAD Neck: JVP 8-9 cm, no thyromegaly or thyroid nodule.  Lungs: Clear to auscultation bilaterally with normal respiratory effort. CV: Nondisplaced PMI.  Heart regular S1/S2, no S3/S4, no murmur.  No peripheral edema.   Abdomen: Soft, nontender, no hepatosplenomegaly, no distention.  Skin: Intact  without lesions or rashes.  Neurologic: Alert and oriented x 3.  Psych: Normal affect. Extremities: No clubbing or cyanosis.  HEENT: Normal.    Telemetry   NSR 60s with PACs (personally reviewed)  Labs    CBC Recent Labs    12/18/20 0439 12/19/20 0520  WBC 8.8 7.9  HGB 9.1* 8.8*  HCT 28.5* 28.0*  MCV 76.2* 77.6*  PLT 229 258   Basic Metabolic Panel Recent Labs    74/12/87 0439 12/19/20 0520  NA 140 138  K 4.0 3.7  CL 101 98  CO2 30 30  GLUCOSE 134* 133*  BUN 38* 37*  CREATININE 2.74* 2.75*  CALCIUM 9.4 9.4  MG 1.8 2.4  PHOS 5.1* 4.8*   Liver Function Tests Recent Labs    12/18/20 0439 12/19/20 0520  ALBUMIN 2.8* 2.7*   No results for input(s): LIPASE, AMYLASE in the last 72 hours. Cardiac Enzymes No results for input(s): CKTOTAL, CKMB, CKMBINDEX, TROPONINI in the last 72 hours.  BNP: BNP (last 3 results) Recent Labs    12/02/20 0915  BNP 1,158.5*    ProBNP (last 3 results) No results for input(s): PROBNP in the last 8760 hours.   D-Dimer No results for input(s): DDIMER in the last 72 hours. Hemoglobin A1C No results for input(s): HGBA1C in the last 72 hours. Fasting Lipid Panel No results for input(s): CHOL, HDL, LDLCALC, TRIG, CHOLHDL, LDLDIRECT  in the last 72 hours. Thyroid Function Tests No results for input(s): TSH, T4TOTAL, T3FREE, THYROIDAB in the last 72 hours.  Invalid input(s): FREET3  Other results:   Imaging    No results found.   Medications:     Scheduled Medications: . (feeding supplement) PROSource Plus  30 mL Oral BID BM  . sodium chloride   Intravenous Once  . amiodarone  400 mg Oral BID  . apixaban  5 mg Oral BID  . atorvastatin  10 mg Oral Daily  . B-complex with vitamin C  1 tablet Oral Daily  . Chlorhexidine Gluconate Cloth  6 each Topical Daily  . feeding supplement  237 mL Oral Q24H  . insulin aspart  0-5 Units Subcutaneous QHS  . insulin aspart  0-9 Units Subcutaneous TID WC  . loratadine  10 mg  Oral Daily  . midodrine  5 mg Oral TID WC  . montelukast  10 mg Oral QHS  . pantoprazole  40 mg Oral Daily  . potassium chloride  40 mEq Oral Once  . senna-docusate  1 tablet Oral BID  . sodium chloride flush  10-40 mL Intracatheter Q12H  . torsemide  40 mg Oral Daily    Infusions: . sodium chloride      PRN Medications: Place/Maintain arterial line **AND** sodium chloride, acetaminophen, ondansetron (ZOFRAN) IV, sodium chloride flush    Assessment/Plan   1. Acute on chronic systolic CHF: Nonischemic cardiomyopathy.  Echo this admission with EF 25-30%, moderate LV dilation, mild LVH, moderate RVE with moderately decreased RV systolic function, severe biatrial enlargement, moderate-severe TR, IVC dilated. EF was low starting in 2016, cath at that time showed mild nonobstructive CAD.  Patient had runs of AF but not sustained when initial diagnosis was made.  Was readmitted in 2018 with persistent AF and CHF, echo with EF 20-25%.  Had DCCV back to NSR at that time.  EF up to 55-60% in 2019.  Lost to followup, now back with progressive dyspnea/volume overload and EF back down in setting of AF with RVR.  Difficult to identify initial etiology of CMP but cannot rule out tachy-mediated.  UDS negative for cocaine. 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  He does not feel AF and we are not sure how long he has been in it.  He was 30+ lbs up with marked volume overload and concern for low output HF (progressive rise in creatinine with attempts at diuresis).  Poor response to initiation of milrinone 0.25 then norepinephrine, creatinine continued to rise and CVVH started 5/12. CVVH stopped 5/20 with good UOP on IV Lasix.  In and out of atrial flutter, currently NSR. CO-OX 60%, CVP 11 today.  I/Os -1817 net.   - Increase torsemide back to 40 mg daily.    - Hold off on coronary angiography with renal dysfunction.   - Want to maintain NSR, continue amiodarone.  - Unna boots.  -  BP stable, decrease midodrine to 5 mg tid.   - Unable to start GDMT due to renal dysfunction and low BP.   2. Atrial fibrillation/flutter: Admitted with afib/RVR, uncertain how long this has been present.  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 S/p TEE/DCCV 5/16. Prior to DCCV, was in atrial fibrillation. After DCCV, has been in and out of atrial flutter.  Have had trouble keeping him in NSR consistently despite amiodarone gtt.  He was seen by EP, offered AFL ablation now with full AF ablation down the road as outpatient, but  he refused procedure.  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  Today, he is in NSR.   - Transition to po amiodarone 400 mg bid.   - Continue Eliquis.  3. AKI: Uncertain baseline, creatinine 1.77 at arrival, creatinine up to 3.49 with poor diuresis and CVVH started with intractable volume overload. Now off CVVH.  Creatinine stable at 2.75.   - Nephrology appreciated.   4. Hematuria: Seen by urology => think old blood in urine, renal ultrasound ok.  Recommend continuing heparin gtt.   Recurrent hematuria.  He refuses catherization. Urology saw and recommend outpatient follow up.  5. Iron-deficieny anemia: Maroon stool has been seen. Hgb up after blood 7.8 >9.1  - received feraheme  - Follow closely, transfuse hgb < 8.   6. Deconditioning: PT/OT, ambulate.   Continues to refuse ablation.  Hopefully can get him home relatively soon if creatinine remains stable and rhythm remains stable on po amiodarone.   Marca Ancona 12/19/2020 8:00 AM

## 2020-12-19 NOTE — TOC Progression Note (Signed)
Transition of Care (TOC) - Progression Note  Heart Failure   Patient Details  Name: Darriel Utter MRN: [id number] Date of Birth: August 01, 1955  Transition of [facility]) CM/SW Contact  Ade Stmarie, LCSWA Phone Number: 12/19/2020, 4:47 PM  Clinical Narrative:    CSW spoke with with patient at bedside to discuss his living situation at the group home and patient reported no concerns. CSW again spoke with Mr. Beals regarding CSW enrolling Mr. Azzara in [employer] and provided him with their number. Mr. Closs reported that he needs a rollator and a 3 in 1, CSW will reach out to [facility] regarding patients DME needs.  CSW will continue to follow throughout discharge      Barriers to Discharge: Continued Medical Work up  Expected Discharge Plan and Services   In-house Referral: Clinical Social Work                                             Social Determinants of Health (SDOH) Interventions Food Insecurity Interventions: Other (Comment) (Patient reports having food stamps but it would be helpful if he could have groceries delivered as transportation is a concern.) Financial Strain Interventions: Other (Comment) (transportation is a concern and patient reported he cannot always afford public transportation) Housing Interventions: Intervention Not Indicated Transportation Interventions: Retail banker  Readmission Risk Interventions No flowsheet data found.  Babbette Dalesandro, MSW, LCSWA [phone] Heart Failure Social Worker

## 2020-12-19 NOTE — Progress Notes (Signed)
Nephrology Progress Note:    Subjective:  had 2 liters UOP over 5/25 reporting period thus far.  Feels ok today.  No blood per rectum.  He has been moved to [facility].  He's just on 1 liter oxygen this am.      Review of systems:     Denies shortness of breath or chest pain  Denies n/v States urinating without difficulty Urine is darker   Objective Vital signs in last 24 hours: Vitals:   12/18/20 1130 12/18/20 1933 12/18/20 2300 12/19/20 0300  BP: 128/82 (!) 145/119 124/83 106/70  Pulse: (!) 55 76 87 64  Resp: 19 18 17 12   Temp: 98.4 F (36.9 C) 98.6 F (37 C) 98.5 F (36.9 C) 97.6 F (36.4 C)  TempSrc: Oral Oral Oral Oral  SpO2: 98% 90% 100% 98%  Weight:    111.2 kg  Height:       Weight change: 4.196 kg  Intake/Output Summary (Last 24 hours) at 12/19/2020 0552 Last data filed at 12/19/2020 0500 Gross per 24 hour  Intake 249 ml  Output 2000 ml  Net -1751 ml    Labs: Basic Metabolic Panel: Recent Labs  Lab 12/16/20 0454 12/16/20 1212 12/17/20 0358 12/18/20 0439  NA 139 141  141 136 140  K 3.7 3.9  3.9 3.7 4.0  CL 101  --  95* 101  CO2 31  --  28 30  GLUCOSE 87  --  321* 134*  BUN 39*  --  38* 38*  CREATININE 3.06*  --  2.68* 2.74*  CALCIUM 9.0  --  8.6* 9.4  PHOS 5.0*  --  4.7* 5.1*   Liver Function Tests: Recent Labs  Lab 12/16/20 0454 12/17/20 0358 12/18/20 0439  ALBUMIN 2.6* 2.6* 2.8*   CBC: Recent Labs  Lab 12/15/20 0324 12/16/20 0454 12/16/20 1212 12/17/20 0358 12/18/20 0439 12/19/20 0520  WBC 11.0* 8.5  --  7.7 8.8 7.9  HGB 8.1* 8.2*   < > 7.8* 9.1* 8.8*  HCT 25.5* 25.4*   < > 24.6* 28.5* 28.0*  MCV 73.7* 73.2*  --  74.8* 76.2* 77.6*  PLT 166 177  --  190 229 258   < > = values in this interval not displayed.   Cardiac Enzymes: No results for input(s): CKTOTAL, CKMB, CKMBINDEX, TROPONINI in the last 168 hours. CBG: Recent Labs  Lab 12/18/20 0629 12/18/20 1142 12/18/20 1628 12/18/20 2103 12/19/20 0537  GLUCAP 97 117* 159* 74  127*   Studies/Results: No results found. Medications: Infusions: . sodium chloride    . amiodarone 60 mg/hr (12/19/20 0147)    Scheduled Medications: . (feeding supplement) PROSource Plus  30 mL Oral BID BM  . sodium chloride   Intravenous Once  . apixaban  5 mg Oral BID  . atorvastatin  10 mg Oral Daily  . B-complex with vitamin C  1 tablet Oral Daily  . Chlorhexidine Gluconate Cloth  6 each Topical Daily  . feeding supplement  237 mL Oral Q24H  . insulin aspart  0-5 Units Subcutaneous QHS  . insulin aspart  0-9 Units Subcutaneous TID WC  . loratadine  10 mg Oral Daily  . midodrine  10 mg Oral TID WC  . montelukast  10 mg Oral QHS  . pantoprazole  40 mg Oral Daily  . senna-docusate  1 tablet Oral BID  . sodium chloride flush  10-40 mL Intracatheter Q12H  . torsemide  20 mg Oral Daily    have reviewed scheduled and  prn medications.  Physical Exam:  General adult male in bed in no acute distress HEENT normocephalic atraumatic extraocular movements intact sclera anicteric Neck supple trachea midline Lungs clear to auscultation bilaterally unlabored  Heart S1S2 no rub Abdomen soft nontender nondistended/obese habitus Extremities trace left lower extremity edema; none right; legs wrapped Psych normal mood and affect Neuro alert & oriented and answers questions gu - no foley in place   Assessment/ Plan: Pt is a 65 y.o. yo male -  crt 1.11 a year ago, who was admitted on 12/02/2020 with Afib and volume overload.   Initial crt 1.7 but increased in the setting of attempted diuresis and continued hemodyanamic instability  -  CRRT 5/12 - 5/20  1. Cardiomyopathy - TEE with EF of 25%, moderately decreased RV function, mitral and tricuspid regurgitation.  Heart failure managing.   2. AKI on CKD - baseline between 1.2-1.75 (1.75 on admit). Likely cardiorenal syndrome with ATN related to hemodynamic instability. CRRT started given volume overload 5/12-5/20.  Note hx of gross hematuria  resolved.  Note home meds include the NSAID naproxen as well as lasix, entresto, spironolactone - renal panel pending  - note torsemide restarted 5/23 per CHF.  Dose reduced to torsemide 20 mg daily starting 5/26.  - continue supportive measures   3. Fluid volume overload - improved s/p RRT; weight down quite a bit.  On torsemide 40 mg daily starting 5/22.  4.  A fib control per primary; on amio  5. Anemia microcytic- some maroon stools this hospitalization per charting (and note hx NSAID use).  Iron deficiency s/p feraheme 5/15.  Redosed feraheme 5/23.  aranesp 40 mcg on 5/24. S/p PRBC's  6. HTN  Charted as hx of HTN.  Now on midodrine.   7. Gross hematuria with clots - appreciate urology - patient will need cysto outpatient if he is agreeable; he has thus far declined intervention    Estanislado Emms, MD 12/19/2020, 5:52 AM

## 2020-12-19 NOTE — Progress Notes (Signed)
Mobility Specialist - Progress Note   12/19/20 1335  Mobility  Activity Ambulated in room  Level of Assistance Standby assist, set-up cues, supervision of patient - no hands on  Assistive Device None  Distance Ambulated (ft) 50 ft  Mobility Ambulated with assistance in room  Mobility Response Tolerated well  Mobility performed by Mobility specialist  $Mobility charge 1 Mobility   Pre-mobility: 92 HR During mobility: 122 HR Post-mobility: 96 HR  Pt cleaned himself w/o assist after having a BM, then walked to the sink to wash his hands. He refused to go in the hallway due to fears of urinary incontinence. Pt walked around his room, then completed 3 sit-to-stands before sitting in his recliner. Pt states he felt fatigued after the sit-to-stands. He was left in the recliner w/ the call bell at his side.   Mamie Levers Mobility Specialist Mobility Specialist Phone: [phone]

## 2020-12-20 DIAGNOSIS — I5043 Acute on chronic combined systolic (congestive) and diastolic (congestive) heart failure: Secondary | ICD-10-CM | POA: Diagnosis not present

## 2020-12-20 LAB — RENAL FUNCTION PANEL
Albumin: 2.8 g/dL — ABNORMAL LOW (ref 3.5–5.0)
Anion gap: 6 (ref 5–15)
BUN: 38 mg/dL — ABNORMAL HIGH (ref 8–23)
CO2: 32 mmol/L (ref 22–32)
Calcium: 9.1 mg/dL (ref 8.9–10.3)
Chloride: 101 mmol/L (ref 98–111)
Creatinine, Ser: 2.66 mg/dL — ABNORMAL HIGH (ref 0.61–1.24)
GFR, Estimated: 26 mL/min — ABNORMAL LOW (ref >60)
Glucose, Bld: 90 mg/dL (ref 70–99)
Phosphorus: 5.4 mg/dL — ABNORMAL HIGH (ref 2.5–4.6)
Potassium: 4.1 mmol/L (ref 3.5–5.1)
Sodium: 139 mmol/L (ref 135–145)

## 2020-12-20 LAB — GLUCOSE, CAPILLARY
Glucose-Capillary: 91 mg/dL (ref 70–99)
Glucose-Capillary: 102 mg/dL — ABNORMAL HIGH (ref 70–99)
Glucose-Capillary: 84 mg/dL (ref 70–99)
Glucose-Capillary: 133 mg/dL — ABNORMAL HIGH (ref 70–99)

## 2020-12-20 LAB — CBC
HCT: 28.6 % — ABNORMAL LOW (ref 39.0–52.0)
Hemoglobin: 8.9 g/dL — ABNORMAL LOW (ref 13.0–17.0)
MCH: 24.4 pg — ABNORMAL LOW (ref 26.0–34.0)
MCHC: 31.1 g/dL (ref 30.0–36.0)
MCV: 78.4 fL — ABNORMAL LOW (ref 80.0–100.0)
Platelets: 276 10*3/uL (ref 150–400)
RBC: 3.65 MIL/uL — ABNORMAL LOW (ref 4.22–5.81)
RDW: 21.9 % — ABNORMAL HIGH (ref 11.5–15.5)
WBC: 6.9 10*3/uL (ref 4.0–10.5)
nRBC: 0.3 % — ABNORMAL HIGH (ref 0.0–0.2)

## 2020-12-20 LAB — COOXEMETRY PANEL
Carboxyhemoglobin: 1.2 % (ref 0.5–1.5)
Methemoglobin: 1.4 % (ref 0.0–1.5)
O2 Saturation: 61.8 %
Total hemoglobin: 9.4 g/dL — ABNORMAL LOW (ref 12.0–16.0)

## 2020-12-20 LAB — MAGNESIUM: Magnesium: 2 mg/dL (ref 1.7–2.4)

## 2020-12-20 NOTE — Progress Notes (Signed)
Mobility Specialist - Progress Note   12/20/20 1250  Mobility  Activity Ambulated in hall  Level of Assistance Standby assist, set-up cues, supervision of patient - no hands on  Assistive Device Four wheel walker  Distance Ambulated (ft) 400 ft  Mobility Ambulated with assistance in hallway  Mobility Response Tolerated well  Mobility performed by Mobility specialist  $Mobility charge 1 Mobility   Pt asx throughout ambulation. HR up to 115 w/ mobility and his SpO2 stayed >93% on RA throughout. Pt to recliner after walk, call bell at side.   Mamie Levers Mobility Specialist Mobility Specialist Phone: [phone]

## 2020-12-20 NOTE — TOC Progression Note (Signed)
Transition of Care (TOC) - Progression Note  Heart Failure   Patient Details  Name: Kevin [name] MRN: [id number] Date of Birth: Jan 19, 1956  Transition [facility] Care [facility]) CM/SW Contact  Burnham Trost, LCSWA Phone Number: 12/20/2020, 3:02 PM  Clinical Narrative:    CSW spoke with the patient at bedside and informed him about a hospital follow up scheduled with Thad Ranger for 01/22/21. CSW proved Mr. Haye with an appointment card for the [facility] outpatient clinic and encouraged him to follow up and to attend the appointment and bring their medications and if anything changes to please reach out so that CSW/HV clinic team can provide support. CSW informed Mr. Pecha that [employer] [facility] has been arranged to pick him up and drop him off for the [facility] outpatient appointment and they will call him for more details next week.  TOC will continue to follow for discharge needs.   Expected Discharge Plan: Home/Self Care Barriers to Discharge: Continued Medical Work up  Expected Discharge Plan and Services Expected Discharge Plan: Home/Self Care In-house Referral: Clinical Social Work Discharge Planning Services: CM Consult Post Acute Care Choice: Durable Medical Equipment Living arrangements for the past 2 months: Group Home (Sober living home)                 DME Arranged: 3-N-1,Walker rolling with seat DME Agency: [employer] Date DME Agency Contacted: 12/20/20 Time DME Agency Contacted: 1155 Representative spoke with at DME Agency: Velna Hatchet             Social Determinants of Health (SDOH) Interventions Food Insecurity Interventions: Other (Comment) (Patient reports having food stamps but it would be helpful if he could have groceries delivered as transportation is a concern.) Financial Strain Interventions: Other (Comment) (transportation is a concern and patient reported he cannot always afford public transportation) Housing Interventions: Intervention Not Indicated Transportation  Interventions: Retail banker  Readmission Risk Interventions No flowsheet data found.  Syair Fricker, MSW, LCSWA [phone] Heart Failure Social Worker

## 2020-12-20 NOTE — Progress Notes (Addendum)
Nephrology Progress Note:    Subjective:  had 1.9 liters UOP over 5/26.  Note torsemide increased back to 40 mg daily.  He states does live in [place]. He asks me what we would do at a follow-up appointment I indicated that we would check labs and manage his kidney failure.    Review of systems:    Denies shortness of breath or chest pain  Denies n/v States urinating without difficulty   Objective Vital signs in last 24 hours: Vitals:   12/19/20 1657 12/19/20 1952 12/19/20 2300 12/20/20 0300  BP: 130/76 100/70 108/71 103/73  Pulse:  80 81 80  Resp:  15 18 17   Temp:  98.3 F (36.8 C) 98.1 F (36.7 C) 97.8 F (36.6 C)  TempSrc:  Oral Oral Oral  SpO2:  98% 96% 97%  Weight:    110 kg  Height:       Weight change: -1.2 kg  Intake/Output Summary (Last 24 hours) at 12/20/2020 0545 Last data filed at 12/19/2020 2000 Gross per 24 hour  Intake --  Output 1901 ml  Net -1901 ml    Labs: Basic Metabolic Panel: Recent Labs  Lab 12/17/20 0358 12/18/20 0439 12/19/20 0520  NA 136 140 138  K 3.7 4.0 3.7  CL 95* 101 98  CO2 28 30 30   GLUCOSE 321* 134* 133*  BUN 38* 38* 37*  CREATININE 2.68* 2.74* 2.75*  CALCIUM 8.6* 9.4 9.4  PHOS 4.7* 5.1* 4.8*   Liver Function Tests: Recent Labs  Lab 12/17/20 0358 12/18/20 0439 12/19/20 0520  ALBUMIN 2.6* 2.8* 2.7*   CBC: Recent Labs  Lab 12/15/20 0324 12/16/20 0454 12/16/20 1212 12/17/20 0358 12/18/20 0439 12/19/20 0520  WBC 11.0* 8.5  --  7.7 8.8 7.9  HGB 8.1* 8.2*   < > 7.8* 9.1* 8.8*  HCT 25.5* 25.4*   < > 24.6* 28.5* 28.0*  MCV 73.7* 73.2*  --  74.8* 76.2* 77.6*  PLT 166 177  --  190 229 258   < > = values in this interval not displayed.   Cardiac Enzymes: No results for input(s): CKTOTAL, CKMB, CKMBINDEX, TROPONINI in the last 168 hours. CBG: Recent Labs  Lab 12/19/20 0537 12/19/20 0641 12/19/20 1119 12/19/20 1600 12/19/20 2120  GLUCAP 127* 125* 107* 87 103*   Studies/Results: No results  found. Medications: Infusions: . sodium chloride      Scheduled Medications: . (feeding supplement) PROSource Plus  30 mL Oral BID BM  . sodium chloride   Intravenous Once  . amiodarone  400 mg Oral BID  . apixaban  5 mg Oral BID  . atorvastatin  10 mg Oral Daily  . B-complex with vitamin C  1 tablet Oral Daily  . Chlorhexidine Gluconate Cloth  6 each Topical Daily  . feeding supplement  237 mL Oral Q24H  . insulin aspart  0-5 Units Subcutaneous QHS  . insulin aspart  0-9 Units Subcutaneous TID WC  . loratadine  10 mg Oral Daily  . midodrine  5 mg Oral TID WC  . montelukast  10 mg Oral QHS  . pantoprazole  40 mg Oral Daily  . senna-docusate  1 tablet Oral BID  . sodium chloride flush  10-40 mL Intracatheter Q12H  . torsemide  40 mg Oral Daily    have reviewed scheduled and prn medications.  Physical Exam:  General adult male in bed in no acute distress  HEENT normocephalic atraumatic extraocular movements intact sclera anicteric Neck supple trachea midline Lungs clear  to auscultation bilaterally unlabored  Heart S1S2 no rub Abdomen soft nontender nondistended/obese habitus Extremities no lower extremity edema; legs wrapped Psych normal mood and affect Neuro alert & oriented and answers questions gu - no foley in place   Assessment/ Plan: Pt is a 65 y.o. yo male -  crt 1.11 a year ago, who was admitted on 12/02/2020 with Afib and volume overload.   Initial crt 1.7 but increased in the setting of attempted diuresis and continued hemodyanamic instability  -  CRRT 5/12 - 5/20  1. Cardiomyopathy - TEE with EF of 25%, moderately decreased RV function, mitral and tricuspid regurgitation.  Heart failure managing.   2. AKI on CKD - baseline between 1.2-1.75 (1.75 on admit). Likely cardiorenal syndrome with ATN related to hemodynamic instability. CRRT started given volume overload 5/12-5/20.  Note hx of gross hematuria resolved.  Note home meds include the NSAID naproxen as well as  lasix, entresto, spironolactone - renal panel pending - continue torsemide  - continue supportive measures   3. Fluid volume overload - improved s/p RRT; weight down quite a bit.  On torsemide 40 mg daily starting 5/22.  4.  A fib control per primary; on po amio per cardiology. Per cardiology he has refused ablation.   5. Anemia microcytic- some maroon stools this hospitalization per charting (and note hx NSAID use).  Iron deficiency s/p feraheme 5/15.  Redosed feraheme 5/23.  aranesp 40 mcg on 5/24. S/p PRBC's  6. HTN  Charted as hx of HTN.  Now on midodrine.   7. Gross hematuria with clots - appreciate urology - patient will need cysto outpatient if he is agreeable; he has thus far declined intervention    Disposition - per primary team.  Labs today pending.  If labs from today are stable from a renal standpoint will plan to sign off.  Will need outpatient follow-up with nephrology - I will set up for 2 weeks in clinic.    Estanislado Emms, MD 12/20/2020.5:57 AM     Nephrology will sign off.  Thank you for the consult.    Estanislado Emms, MD 12/20/2020 7:35 AM

## 2020-12-20 NOTE — Discharge Summary (Addendum)
Advanced Heart Failure Team  Discharge Summary   Patient ID: Kevin [name] MRN: [id number], DOB/AGE: 67/18/1957 67 y.o. Admit date: 12/02/2020 D/C date:     12/21/2020   Primary Discharge Diagnoses:  Acute on Chronic Systolic Heart Failure/ NICM Persistent Atrial Fibrillation/Flutter w/ RVR AKI on Stage III CKD  Hematuria  Iron Deficiency Anemia Deconditioning    Hospital Course:   65 y/o male w/ h/o cardiomyopathy dating back to 2016, echo showed EF 25-30%.  At the time, cath showed 40% proximal LAD stenosis. He was noted at the time to have paroxysmal atrial fibrillation but it was not sustained. In 2018, he was admitted with afib/RVR, echo with EF 20-25%.  He had DCCV back to NSR.  ICD was recommended but he declined. Subsequently, echo in 2019 showed EF up to 55-60%.  He had been seen as an outpatient by Dr. Debara Pickett but was lost to followup for about a year.    Presented to [facility] on 5/9 w/ 1-2 months of steadily worsening exertional dyspnea and lower extremity edema w/ self reported 30 lb wt gain + orthopnea/PND. At admission, patient was noted to be in atrial fibrillation with RVR and volume overloaded. BNP 1150, HS-TnI 28=>30 (mild elevation, no trend). CXR with right pleural effusion.  No fever, normal WBCs.  He was started on Lasix with poor response. Creatinine 1.77 initially, increased to 2.35 since admission.  Echo was done showing EF 25-30%, moderate LV dilation, mild LVH, moderate RVE with moderately decreased RV systolic function, severe biatrial enlargement, moderate-severe TR, IVC dilated. AHF team consulted and he was placed on milrinone and NE w/ poor response. Placed on CVVH for refractory volume overload. Volume status and renal function improved and able to wean off CVVH and placed back on diuretics w/ improved response. Later transitioned to PO torsemide. He was able to wean off of milrinone and NE. Required TEE w/ DCCV x 2 this admission. EP was consulted and offered ablation but pt  refused.  Will continue on PO amio and Eliquis. Unable to tolerate GDMT due to soft BP and CKD. Require midodrine for BP support.   On 5/28 he was last seen and examined by Dr. Haroldine Laws and felt stable for d/c home. Post hospital f/u has been arranged in the [facility].   See Detailed Hospital Problem List Below  1. Acute on chronic systolic CHF: Nonischemic cardiomyopathy.  Echo this admission with EF 25-30%, moderate LV dilation, mild LVH, moderate RVE with moderately decreased RV systolic function, severe biatrial enlargement, moderate-severe TR, IVC dilated. EF was low starting in 2016, cath at that time showed mild nonobstructive CAD.  Patient had runs of AF but not sustained when initial diagnosis was made.  Was readmitted in 2018 with persistent AF and CHF, echo with EF 20-25%.  Had DCCV back to NSR at that time.  EF up to 55-60% in 2019.  Lost to followup, now back with progressive dyspnea/volume overload and EF back down in setting of AF with RVR.  Difficult to identify initial etiology of CMP but cannot rule out tachy-mediated.  UDS negative for cocaine. 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  He does not feel AF and we are not sure how long he has been in it.  He was 30+ lbs up with marked volume overload and concern for low output HF (progressive rise in creatinine with attempts at diuresis).  Poor response to initiation of milrinone 0.25 then norepinephrine, creatinine continued to rise and CVVH started 5/12. CVVH  stopped 5/20 with good UOP on IV Lasix.  In and out of atrial flutter, currently mainly NSR. CO-OX 59%, CVP 8-9 today and weight decreasing.  - Continue torsemide 40 mg daily.    - Hold off on coronary angiography with renal dysfunction, will need down the road if creatinine continues to come down.   - Want to maintain NSR, continue amiodarone.  - SBP 110-130 on midodrine 5 mg tid.  Will decrease to 2.5 tid - Unable to start GDMT due to renal dysfunction and  low BP.   2. Atrial fibrillation/flutter: Admitted with afib/RVR, uncertain how long this has been present.  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 S/p TEE/DCCV 5/16. Prior to DCCV, was in atrial fibrillation. After DCCV, has been in and out of atrial flutter.  Have had trouble keeping him in NSR consistently despite amiodarone gtt.  He was seen by EP, offered AFL ablation now with full AF ablation down the road as outpatient, but he refused procedure.  Wants to see if his heart can get back into rhythm with "diet and exercise."  Dr. 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  Remains in NSR  - Continue amiodarone will d/c on 200 mg bid, slow taper down to eventual goal 200 mg daily.   - Continue Eliquis.  - Refuses ablation 3. AKI: Uncertain baseline, creatinine 1.77 at arrival, creatinine up to 3.49 with poor diuresis and CVVH started with intractable volume overload. Now off CVVH.  Creatinine lower at 2.5 this morning.   - Nephrology appreciated.   4. Hematuria: Seen by urology => think old blood in urine, renal ultrasound ok.  Recommend outpatient follow up.  5. Iron-deficieny anemia: Maroon stool has been seen. Hgb up after blood 7.8 >9.1>8.9> 9.1 - received feraheme  - Follow closely, transfuse hgb < 8.   6. Deconditioning: PT/OT, ambulate.    Discharge home today with f/u in HF Clinic   D/c HF meds  Amio 200 bid Apixaban 5 bid  Midodrine 2.5 tid  Torsemide 40 daily  Atorva 10 daily K 20 daily  Discharge Weight Range: 242.5 lbs (110kg) Discharge Vitals: Blood pressure 120/82, pulse (!) 102, temperature 98.4 F (36.9 C), temperature source Oral, resp. rate (!) 22, height 6' 1"  (1.854 m), weight 108.6 kg, SpO2 94 %.  Labs: Lab Results  Component Value Date   WBC 7.6 12/21/2020   HGB 9.1 (L) 12/21/2020   HCT 28.5 (L) 12/21/2020   MCV 77.9 (L)  12/21/2020   PLT 284 12/21/2020    Recent Labs  Lab 12/21/20 0335  NA 138  K 3.9  CL 99  CO2 31  BUN 36*  CREATININE 2.52*  CALCIUM 9.1  GLUCOSE 105*   Lab Results  Component Value Date   CHOL 68 12/05/2020   HDL 23 (L) 12/05/2020   LDLCALC 35 12/05/2020   TRIG 48 12/05/2020   BNP (last 3 results) Recent Labs    12/02/20 0915  BNP 1,158.5*    ProBNP (last 3 results) No results for input(s): PROBNP in the last 8760 hours.   Diagnostic Studies/Procedures   No results found.  Discharge Medications   Allergies as of 12/21/2020   No Known Allergies      Medication List     STOP taking these medications    doxycycline 100 MG tablet Commonly known as: VIBRA-TABS  Entresto 49-51 MG Generic drug: sacubitril-valsartan   furosemide 40 MG tablet Commonly known as: LASIX   metoprolol succinate 50 MG 24 hr tablet Commonly known as: TOPROL-XL   naproxen sodium 220 MG tablet Commonly known as: ALEVE   spironolactone 25 MG tablet Commonly known as: ALDACTONE       TAKE these medications    Accu-Chek Guide test strip Generic drug: glucose blood TEST UP TO 4 TIMES A DAY AS DIRECTED What changed: See the new instructions.   Accu-Chek Softclix Lancets lancets USE UP TO 4 TIMES A DAY AS DIRECTED What changed: See the new instructions.   Adult One Daily Gummies Chew Chew 1-2 tablets by mouth daily.   albuterol (2.5 MG/3ML) 0.083% nebulizer solution Commonly known as: PROVENTIL Take 3 mLs (2.5 mg total) by nebulization every 6 (six) hours as needed for wheezing or shortness of breath. What changed:  when to take this additional instructions   albuterol 108 (90 Base) MCG/ACT inhaler Commonly known as: VENTOLIN HFA Inhale 2 puffs into the lungs every 6 (six) hours as needed for wheezing or shortness of breath. What changed:  when to take this additional instructions   amiodarone 200 MG tablet Commonly known as: PACERONE Take 1 tablet (200 mg  total) by mouth 2 (two) times daily.   apixaban 5 MG Tabs tablet Commonly known as: ELIQUIS Take 1 tablet (5 mg total) by mouth 2 (two) times daily.   atorvastatin 10 MG tablet Commonly known as: LIPITOR Take 1 tablet (10 mg total) by mouth daily.   blood glucose meter kit and supplies Dispense based on patient and insurance preference. Use up to four times daily as directed. (FOR ICD-10 E10.9, E11.9).   cetirizine 10 MG tablet Commonly known as: ZYRTEC TAKE 1 TABLET BY MOUTH EVERY DAY   cyclobenzaprine 10 MG tablet Commonly known as: FLEXERIL TAKE 1 TABLET BY MOUTH THREE TIMES A DAY AS NEEDED FOR MUSCLE SPASMS   fluticasone 50 MCG/ACT nasal spray Commonly known as: FLONASE SPRAY 2 SPRAYS INTO EACH NOSTRIL EVERY DAY What changed:  how much to take how to take this when to take this additional instructions   glipiZIDE 10 MG tablet Commonly known as: GLUCOTROL Take 1 tablet (10 mg total) by mouth 2 (two) times daily before a meal.   lansoprazole 30 MG capsule Commonly known as: PREVACID Take 1 capsule (30 mg total) by mouth daily as needed (for heartburn/indigestion).   metFORMIN 1000 MG tablet Commonly known as: GLUCOPHAGE Take 1 tablet (1,000 mg total) by mouth 2 (two) times daily with a meal.   midodrine 2.5 MG tablet Commonly known as: PROAMATINE Take 1 tablet (2.5 mg total) by mouth 3 (three) times daily with meals.   montelukast 10 MG tablet Commonly known as: SINGULAIR Take 1 tablet (10 mg total) by mouth at bedtime.   OVER THE COUNTER MEDICATION Place 1-2 drops into both eyes See admin instructions. [place] Maximum Redness Relief Cooling Eye Drops- Place 1-2 drops into both eyes up to 4 times a day as needed for irritation   potassium chloride SA 20 MEQ tablet Commonly known as: KLOR-CON Take 1 tablet (20 mEq total) by mouth daily.   sitaGLIPtin 25 MG tablet Commonly known as: Januvia Take 1 tablet (25 mg total) by mouth daily.   Torsemide 40 MG  Tabs Take 40 mg by mouth daily. Start taking on: Dec 22, 2020   Vitamin D (Ergocalciferol) 1.25 MG (50000 UNIT) Caps capsule Commonly known as: DRISDOL TAKE 1  CAPSULE (50,000 UNITS TOTAL) BY MOUTH EVERY 7 (SEVEN) DAYS.               Durable Medical Equipment  (From admission, onward)           Start     Ordered   12/20/20 1203  For home use only DME 4 wheeled rolling walker with seat  Once       Question:  Patient needs a walker to treat with the following condition  Answer:  Acute CHF ([place])   12/20/20 1202   12/20/20 1202  For home use only DME 3 n 1  Once        12/20/20 1202            Disposition   The patient will be discharged in stable condition to home.    Follow-up Information     [facility] Follow up.   Specialty: Cardiology Why: January 09, 2021 at 10:00 AM  The [place] Code 4233 Contact information: [address] [id number] [place] [phone]                  Duration of Discharge Encounter: Greater than 35 minutes   Signed, Angelena Form  12/21/2020, 12:21 PM  Patient seen and examined with the above-signed Advanced Practice Provider and/or Housestaff. I personally reviewed laboratory data, imaging studies and relevant notes. I independently examined the patient and formulated the important aspects of the plan. I have edited the note to reflect any of my changes or salient points. I have personally discussed the plan with the patient and/or family.  See rounding note from earlier today for further details. Stable for d/c today with f/u in HF Clinic.   Glori Bickers, MD  1:24 PM

## 2020-12-20 NOTE — Plan of Care (Signed)
  Problem: Education: Goal: Ability to demonstrate management of disease process will improve Outcome: Progressing   Problem: Education: Goal: Ability to verbalize understanding of medication therapies will improve Outcome: Progressing   Problem: Activity: Goal: Capacity to carry out activities will improve Outcome: Progressing   Problem: Education: Goal: Knowledge of General Education information will improve Description: Including pain rating scale, medication(s)/side effects and non-pharmacologic comfort measures Outcome: Progressing   Problem: Health Behavior/Discharge Planning: Goal: Ability to manage health-related needs will improve Outcome: Progressing   Problem: Clinical Measurements: Goal: Ability to maintain clinical measurements within normal limits will improve Outcome: Progressing   Problem: Clinical Measurements: Goal: Diagnostic test results will improve Outcome: Progressing   Problem: Clinical Measurements: Goal: Cardiovascular complication will be avoided Outcome: Progressing   Problem: Activity: Goal: Risk for activity intolerance will decrease Outcome: Progressing   Problem: Nutrition: Goal: Adequate nutrition will be maintained Outcome: Progressing   Problem: Pain Managment: Goal: General experience of comfort will improve Outcome: Progressing

## 2020-12-20 NOTE — Progress Notes (Addendum)
Physical Therapy Treatment Patient Details Name: Kevin [name] MRN: [id number] DOB: [birth year] Today's Date: 12/20/2020    History of Present Illness 65 y.o. admitted 5/9 with Afib and volume overload with acute on chronic CHF. CRRT started 5/12. 5/16 TEE with DCCV and EF 25%. PMhx: combined CHF, NICM, non-obstructive CAD, paroxysmal atrial fibrillation, moderate mitral regurgitation, HTN, DM type 2, COPD, tobacco abuse    PT Comments    Pt agreeable to therapy session, with good participation and tolerance for gait progression in hallway community distances using rollator. Pt at modI level for gait with rollator and pt would benefit from rollator upon discharge due to decreased activity tolerance. Pt continues to benefit from PT services to progress toward functional mobility goals. Anticipate pt safe to discharge once medically cleared, will continue to follow acutely to progress strength/endurance.  Follow Up Recommendations  No PT follow up     Equipment Recommendations  Other (comment) (rollator)    Recommendations for Other Services       Precautions / Restrictions Precautions Precautions: Fall;Other (comment) Precaution Comments: watch HR with Afib Restrictions Weight Bearing Restrictions: No    Mobility  Bed Mobility Overal bed mobility: Modified Independent Bed Mobility: Supine to Sit     Supine to sit: Modified independent (Device/Increase time);HOB elevated     General bed mobility comments: use of bed features, increased time.    Transfers Overall transfer level: Independent Equipment used: None Transfers: Sit to/from Stand Sit to Stand: Independent         General transfer comment: able to stand without device  Ambulation/Gait Ambulation/Gait assistance: Modified independent (Device/Increase time) Gait Distance (Feet): 600 Feet Assistive device: 4-wheeled walker Gait Pattern/deviations: Step-through pattern Gait velocity: decreased   General Gait  Details: good posture and use of device, pt reports increased confidence/stability with use of rollator and able to progress activity tolerance significantly with device compared with previous session       Balance Overall balance assessment: Needs assistance Sitting-balance support: Feet supported;No upper extremity supported Sitting balance-Leahy Scale: Normal     Standing balance support: No upper extremity supported;During functional activity Standing balance-Leahy Scale: Good Standing balance comment: Pt does not rely on UE support to maintain standing balance, however pt reports feeling much steadier with rollator for long distances         Cognition Arousal/Alertness: Awake/alert Behavior During Therapy: WFL for tasks assessed/performed Overall Cognitive Status: Within Functional Limits for tasks assessed        General Comments: pleasantly cooperative      Exercises      General Comments General comments (skin integrity, edema, etc.): HR max 124 bpm, RN notified pulse oximeter with poor pleth reading needs new sensor; BP 128/84 (97) pre-mobility and 141/73 (91) post-exertion      Pertinent Vitals/Pain Pain Assessment: No/denies pain           PT Goals (current goals can now be found in the care plan section) Acute Rehab PT Goals Patient Stated Goal: return home PT Goal Formulation: With patient Time For Goal Achievement: 12/27/20 Potential to Achieve Goals: Good Progress towards PT goals: Progressing toward goals    Frequency    Min 3X/week      PT Plan Current plan remains appropriate;Equipment recommendations need to be updated       AM-PAC PT "6 Clicks" Mobility   Outcome Measure  Help needed turning from your back to your side while in a flat bed without using bedrails?: None Help needed moving from  lying on your back to sitting on the side of a flat bed without using bedrails?: None Help needed moving to and from a bed to a chair  (including a wheelchair)?: None Help needed standing up from a chair using your arms (e.g., wheelchair or bedside chair)?: None Help needed to walk in hospital room?: A Little Help needed climbing 3-5 steps with a railing? : A Little 6 Click Score: 22    End of Session Equipment Utilized During Treatment: Gait belt Activity Tolerance: Patient tolerated treatment well Patient left: in chair;with call bell/phone within reach Nurse Communication: Mobility status;Other (comment) (urine reddish to orange color) PT Visit Diagnosis: Unsteadiness on feet (R26.81);Other abnormalities of gait and mobility (R26.89)     Time: 9892-1194 PT Time Calculation (min) (ACUTE ONLY): 27 min  Charges:  $Gait Training: 23-37 mins                     Ambree Frances P., PTA Acute Rehabilitation Services Pager: [phone] Office: [phone]   [place] 12/20/2020, 3:01 PM

## 2020-12-20 NOTE — TOC Progression Note (Signed)
Transition of Care [facility]) - Progression Note    Patient Details  Name: Kevin [name] MRN: [id number] Date of Birth: September 05, 1955  [facility] CM/SW Contact  Glennon Mac, RN Phone Number: 12/20/2020, 11:56 AM  Clinical Narrative:  64 y.o. admitted 5/9 with Afib and volume overload with acute on chronic CHF. CRRT started 5/12. 5/16 TEE with DCCV.  PTA, pt independent and lives with 8 other males in a sober living home.  PT/OT recommending no OP follow up; pt requesting 3 in 1 and RW with seat. Spoke with pt regarding DME request; he is agreeable to referral to [employer] [facility] for coordination of DME needs.  [employer] [facility] to deliver DME to patient bedside prior to dc. Pt states that his son is available to assist him as needed.      Expected Discharge Plan: Home/Self Care Barriers to Discharge: Continued Medical Work up  Expected Discharge Plan and Services Expected Discharge Plan: Home/Self Care In-house Referral: Clinical Social Work Discharge Planning Services: CM Consult Post Acute Care Choice: Durable Medical Equipment Living arrangements for the past 2 months: Group Home (Sober living home)                 DME Arranged: 3-N-1,Walker rolling with seat DME Agency: [employer] Date DME Agency Contacted: 12/20/20 Time DME Agency Contacted: 1155 Representative spoke with at DME Agency: Velna Hatchet             Social Determinants of Health (SDOH) Interventions Food Insecurity Interventions: Other (Comment) (Patient reports having food stamps but it would be helpful if he could have groceries delivered as transportation is a concern.) Financial Strain Interventions: Other (Comment) (transportation is a concern and patient reported he cannot always afford public transportation) Housing Interventions: Intervention Not Indicated Transportation Interventions: Retail banker  Readmission Risk Interventions No flowsheet data found.  Quintella Baton, RN, BSN   Trauma/Neuro ICU Case Manager [phone]

## 2020-12-20 NOTE — Care Management Important Message (Signed)
Important Message  Patient Details  Name: Kevin [name] MRN: [id number] Date of Birth: [birth year]   Medicare Important Message Given:  Yes     Seniyah Esker Stefan Church 12/20/2020, 12:43 PM

## 2020-12-20 NOTE — Progress Notes (Signed)
Patient ID: Kevin [name], male   DOB: 06/09/1956, 65 y.o.   MRN: [id number]     Advanced Heart Failure Rounding Note  PCP-Cardiologist: Chrystie Nose, MD   Subjective:    - CVVH started 5/12 with intractable volume overload.   - TEE/DCCV>>NSR 5/16 - CVVH stopped 5/20 - 5/23  RHC with preserved cardiac output, normal pcwp, and mildly RA pressure. Had urinary retention with hematuria - 5/24 Urology recommended outpatient follow up.   Now on po amiodarone and apixaban.  He appears to be mostly in NSR now with occasional short AFL runs.   Creatinine down to 2.66.    CO-OX stable 62%.  CVP 10 on torsemide 40 daily.   Denies SOB. Feels ok. Walked yesterday with PT.   RHC 12/16/20: Hemodynamics (mmHg) RA mean 10 RV 35/10 PA 36/16, mean 24 PCWP mean 14 Oxygen saturations: PA 57% AO 100% Cardiac Output (Fick) 6.43  Cardiac Index (Fick) 2.77 PVR 1.6 WU Cardiac Output (Thermo) 4.85  Cardiac Index (Thermo) 2.09  Objective:   Weight Range: 110 kg Body mass index is 31.99 kg/m.   Vital Signs:   Temp:  [97.8 F (36.6 C)-98.4 F (36.9 C)] 97.8 F (36.6 C) (05/27 0300) Pulse Rate:  [80-86] 86 (05/27 0707) Resp:  [14-18] 14 (05/27 0707) BP: (99-130)/(70-77) 99/75 (05/27 0707) SpO2:  [96 %-98 %] 97 % (05/27 0707) Weight:  [110 kg] 110 kg (05/27 0300) Last BM Date: 12/15/20  Weight change: Filed Weights   12/18/20 0520 12/19/20 0300 12/20/20 0300  Weight: 107 kg 111.2 kg 110 kg    Intake/Output:   Intake/Output Summary (Last 24 hours) at 12/20/2020 0809 Last data filed at 12/19/2020 2000 Gross per 24 hour  Intake --  Output 1901 ml  Net -1901 ml      Physical Exam  CVP 10  General: NAD Neck: JVP 8 cm, no thyromegaly or thyroid nodule.  Lungs: Clear to auscultation bilaterally with normal respiratory effort. CV: Nondisplaced PMI.  Heart regular S1/S2, no S3/S4.  2/6 HSM LLSB/apex.  Trace ankle edema.  Abdomen: Soft, nontender, no hepatosplenomegaly, no  distention.  Skin: Intact without lesions or rashes.  Neurologic: Alert and oriented x 3.  Psych: Normal affect. Extremities: No clubbing or cyanosis.  HEENT: Normal.    Telemetry   NSR 60s with PACs and rare short AFL runs (personally reviewed)  Labs    CBC Recent Labs    12/19/20 0520 12/20/20 0500  WBC 7.9 6.9  HGB 8.8* 8.9*  HCT 28.0* 28.6*  MCV 77.6* 78.4*  PLT 258 276   Basic Metabolic Panel Recent Labs    86/76/19 0520 12/20/20 0500  NA 138 139  K 3.7 4.1  CL 98 101  CO2 30 32  GLUCOSE 133* 90  BUN 37* 38*  CREATININE 2.75* 2.66*  CALCIUM 9.4 9.1  MG 2.4 2.0  PHOS 4.8* 5.4*   Liver Function Tests Recent Labs    12/19/20 0520 12/20/20 0500  ALBUMIN 2.7* 2.8*   No results for input(s): LIPASE, AMYLASE in the last 72 hours. Cardiac Enzymes No results for input(s): CKTOTAL, CKMB, CKMBINDEX, TROPONINI in the last 72 hours.  BNP: BNP (last 3 results) Recent Labs    12/02/20 0915  BNP 1,158.5*    ProBNP (last 3 results) No results for input(s): PROBNP in the last 8760 hours.   D-Dimer No results for input(s): DDIMER in the last 72 hours. Hemoglobin A1C No results for input(s): HGBA1C in the last 72 hours. Fasting  Lipid Panel No results for input(s): CHOL, HDL, LDLCALC, TRIG, CHOLHDL, LDLDIRECT in the last 72 hours. Thyroid Function Tests No results for input(s): TSH, T4TOTAL, T3FREE, THYROIDAB in the last 72 hours.  Invalid input(s): FREET3  Other results:   Imaging    No results found.   Medications:     Scheduled Medications: . (feeding supplement) PROSource Plus  30 mL Oral BID BM  . sodium chloride   Intravenous Once  . amiodarone  400 mg Oral BID  . apixaban  5 mg Oral BID  . atorvastatin  10 mg Oral Daily  . B-complex with vitamin C  1 tablet Oral Daily  . Chlorhexidine Gluconate Cloth  6 each Topical Daily  . feeding supplement  237 mL Oral Q24H  . insulin aspart  0-5 Units Subcutaneous QHS  . insulin aspart  0-9  Units Subcutaneous TID WC  . loratadine  10 mg Oral Daily  . midodrine  5 mg Oral TID WC  . montelukast  10 mg Oral QHS  . pantoprazole  40 mg Oral Daily  . senna-docusate  1 tablet Oral BID  . sodium chloride flush  10-40 mL Intracatheter Q12H  . torsemide  40 mg Oral Daily    Infusions: . sodium chloride      PRN Medications: Place/Maintain arterial line **AND** sodium chloride, acetaminophen, ondansetron (ZOFRAN) IV, sodium chloride flush    Assessment/Plan   1. Acute on chronic systolic CHF: Nonischemic cardiomyopathy.  Echo this admission with EF 25-30%, moderate LV dilation, mild LVH, moderate RVE with moderately decreased RV systolic function, severe biatrial enlargement, moderate-severe TR, IVC dilated. EF was low starting in 2016, cath at that time showed mild nonobstructive CAD.  Patient had runs of AF but not sustained when initial diagnosis was made.  Was readmitted in 2018 with persistent AF and CHF, echo with EF 20-25%.  Had DCCV back to NSR at that time.  EF up to 55-60% in 2019.  Lost to followup, now back with progressive dyspnea/volume overload and EF back down in setting of AF with RVR.  Difficult to identify initial etiology of CMP but cannot rule out tachy-mediated.  UDS negative for cocaine. 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  He does not feel AF and we are not sure how long he has been in it.  He was 30+ lbs up with marked volume overload and concern for low output HF (progressive rise in creatinine with attempts at diuresis).  Poor response to initiation of milrinone 0.25 then norepinephrine, creatinine continued to rise and CVVH started 5/12. CVVH stopped 5/20 with good UOP on IV Lasix.  In and out of atrial flutter, currently mainly NSR. CO-OX 62%, CVP 10 today and weight decreasing.  - Continue torsemide 40 mg daily.    - Hold off on coronary angiography with renal dysfunction, will need down the road if creatinine continues to come  down.   - Want to maintain NSR, continue amiodarone.  - Unna boots.  - SBP 90s-100s on midodrine 5 mg tid.   - Unable to start GDMT due to renal dysfunction and low BP.   2. Atrial fibrillation/flutter: Admitted with afib/RVR, uncertain how long this has been present.  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 S/p TEE/DCCV 5/16. Prior to DCCV, was in atrial fibrillation. After DCCV, has been in and out of atrial flutter.  Have had trouble keeping him in NSR consistently despite amiodarone gtt.  He was seen by EP, offered AFL ablation now  with full AF ablation down the road as outpatient, but he refused procedure.  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  Today, he is in NSR primarily.   - Continue amiodarone 400 mg bid, slow taper down to eventual goal 200 mg daily.   - Continue Eliquis.  3. AKI: Uncertain baseline, creatinine 1.77 at arrival, creatinine up to 3.49 with poor diuresis and CVVH started with intractable volume overload. Now off CVVH.  Creatinine lower at 2.66 this morning.   - Nephrology appreciated.   4. Hematuria: Seen by urology => think old blood in urine, renal ultrasound ok.  Recommend continuing heparin gtt.   Recurrent hematuria.  He refuses catherization. Urology saw and recommend outpatient follow up.  5. Iron-deficieny anemia: Maroon stool has been seen. Hgb up after blood 7.8 >9.1>8.9.   - received feraheme  - Follow closely, transfuse hgb < 8.   6. Deconditioning: PT/OT, ambulate.   Continues to refuse ablation.  Stable now, wants to stay in hospital until tomorrow when his son can stay with him at home.   Marca Ancona 12/20/2020 8:09 AM

## 2020-12-20 NOTE — Progress Notes (Signed)
Discussed HF management, low sodium, fluid restriction, walking for exercise. Pt very receptive. He has materials already but gave him more low sodium diets. He has walked x2 today. [id number] Ethelda Chick CES, ACSM 2:01 PM 12/20/2020

## 2020-12-21 DIAGNOSIS — I5023 Acute on chronic systolic (congestive) heart failure: Secondary | ICD-10-CM | POA: Diagnosis not present

## 2020-12-21 DIAGNOSIS — J449 Chronic obstructive pulmonary disease, unspecified: Secondary | ICD-10-CM | POA: Diagnosis not present

## 2020-12-21 DIAGNOSIS — I5043 Acute on chronic combined systolic (congestive) and diastolic (congestive) heart failure: Secondary | ICD-10-CM | POA: Diagnosis not present

## 2020-12-21 DIAGNOSIS — I4819 Other persistent atrial fibrillation: Secondary | ICD-10-CM | POA: Diagnosis not present

## 2020-12-21 LAB — RENAL FUNCTION PANEL
Albumin: 2.8 g/dL — ABNORMAL LOW (ref 3.5–5.0)
Anion gap: 8 (ref 5–15)
BUN: 36 mg/dL — ABNORMAL HIGH (ref 8–23)
CO2: 31 mmol/L (ref 22–32)
Calcium: 9.1 mg/dL (ref 8.9–10.3)
Chloride: 99 mmol/L (ref 98–111)
Creatinine, Ser: 2.52 mg/dL — ABNORMAL HIGH (ref 0.61–1.24)
GFR, Estimated: 27 mL/min — ABNORMAL LOW (ref >60)
Glucose, Bld: 105 mg/dL — ABNORMAL HIGH (ref 70–99)
Phosphorus: 4.8 mg/dL — ABNORMAL HIGH (ref 2.5–4.6)
Potassium: 3.9 mmol/L (ref 3.5–5.1)
Sodium: 138 mmol/L (ref 135–145)

## 2020-12-21 LAB — CBC
HCT: 28.5 % — ABNORMAL LOW (ref 39.0–52.0)
Hemoglobin: 9.1 g/dL — ABNORMAL LOW (ref 13.0–17.0)
MCH: 24.9 pg — ABNORMAL LOW (ref 26.0–34.0)
MCHC: 31.9 g/dL (ref 30.0–36.0)
MCV: 77.9 fL — ABNORMAL LOW (ref 80.0–100.0)
Platelets: 284 10*3/uL (ref 150–400)
RBC: 3.66 MIL/uL — ABNORMAL LOW (ref 4.22–5.81)
RDW: 22.2 % — ABNORMAL HIGH (ref 11.5–15.5)
WBC: 7.6 10*3/uL (ref 4.0–10.5)
nRBC: 0 % (ref 0.0–0.2)

## 2020-12-21 LAB — COOXEMETRY PANEL
Carboxyhemoglobin: 1.2 % (ref 0.5–1.5)
Methemoglobin: 1.2 % (ref 0.0–1.5)
O2 Saturation: 59.4 %
Total hemoglobin: 12 g/dL (ref 12.0–16.0)

## 2020-12-21 LAB — GLUCOSE, CAPILLARY
Glucose-Capillary: 95 mg/dL (ref 70–99)
Glucose-Capillary: 103 mg/dL — ABNORMAL HIGH (ref 70–99)
Glucose-Capillary: 113 mg/dL — ABNORMAL HIGH (ref 70–99)

## 2020-12-21 LAB — MAGNESIUM: Magnesium: 1.9 mg/dL (ref 1.7–2.4)

## 2020-12-21 MED ORDER — TORSEMIDE 40 MG PO TABS: 40.0000 mg | ORAL_TABLET | Freq: Every day | ORAL | 1 refills | Status: DC

## 2020-12-21 MED ORDER — AMIODARONE HCL 200 MG PO TABS: 200.0000 mg | ORAL_TABLET | Freq: Two times a day (BID) | ORAL | 1 refills | Status: DC

## 2020-12-21 MED ORDER — POTASSIUM CHLORIDE CRYS ER 20 MEQ PO TBCR
20.0000 meq | EXTENDED_RELEASE_TABLET | Freq: Once | ORAL | Status: AC
  Administered 2020-12-21: 20 meq via ORAL
  Filled 2020-12-21: qty 1

## 2020-12-21 MED ORDER — MIDODRINE HCL 2.5 MG PO TABS: 2.5000 mg | ORAL_TABLET | Freq: Three times a day (TID) | ORAL | 1 refills | Status: DC

## 2020-12-21 MED ORDER — POTASSIUM CHLORIDE CRYS ER 20 MEQ PO TBCR: 20.0000 meq | EXTENDED_RELEASE_TABLET | Freq: Every day | ORAL | 1 refills | Status: DC

## 2020-12-21 MED ORDER — AMIODARONE HCL 400 MG PO TABS: 200.0000 mg | ORAL_TABLET | Freq: Two times a day (BID) | ORAL | 1 refills | Status: DC

## 2020-12-21 NOTE — Progress Notes (Signed)
Patient ID: Kevin [name], male   DOB: [birth year], 65 y.o.   MRN: [id number]     Advanced Heart Failure Rounding Note  PCP-Cardiologist: Chrystie Nose, MD   Subjective:    - CVVH started 5/12 with intractable volume overload.   - TEE/DCCV>>NSR 5/16 - CVVH stopped 5/20 - 5/23  RHC with preserved cardiac output, normal pcwp, and mildly RA pressure. Had urinary retention with hematuria - 5/24 Urology recommended outpatient follow up.   Now on po amiodarone and apixaban. Remains in NSR. Creatinine stable 2.5-2.6   CO-OX stable 59%.  CVP 8-9 on torsemide 40 daily.    Objective:   Weight Range: 108.6 kg Body mass index is 31.59 kg/m.   Vital Signs:   Temp:  [97.6 F (36.4 C)-98.6 F (37 C)] 98.1 F (36.7 C) (05/28 0835) Pulse Rate:  [68-78] 68 (05/28 0835) Resp:  [16-20] 17 (05/28 0835) BP: (115-141)/(73-86) 115/80 (05/28 0835) SpO2:  [93 %-97 %] 95 % (05/28 0835) Weight:  [108.6 kg] 108.6 kg (05/28 0327) Last BM Date: 12/15/20  Weight change: Filed Weights   12/19/20 0300 12/20/20 0300 12/21/20 0327  Weight: 111.2 kg 110 kg 108.6 kg    Intake/Output:   Intake/Output Summary (Last 24 hours) at 12/21/2020 1119 Last data filed at 12/21/2020 0900 Gross per 24 hour  Intake 1091.16 ml  Output 1600 ml  Net -508.84 ml      Physical Exam   General:  Sitting up in bed. No resp difficulty HEENT: normal Neck: supple. JVP 8-9. Carotids 2+ bilat; no bruits. No lymphadenopathy or thryomegaly appreciated. Cor: PMI nondisplaced. Regular rate & rhythm. No rubs, gallops or murmurs. Lungs: clear Abdomen: soft, nontender, nondistended. No hepatosplenomegaly. No bruits or masses. Good bowel sounds. Extremities: no cyanosis, clubbing, rash, tr edema + UNNA Neuro: alert & orientedx3, cranial nerves grossly intact. moves all 4 extremities w/o difficulty. Affect pleasant   Telemetry   NSR 60s  (personally reviewed)  Labs    CBC Recent Labs    12/20/20 0500 12/21/20 0335   WBC 6.9 7.6  HGB 8.9* 9.1*  HCT 28.6* 28.5*  MCV 78.4* 77.9*  PLT 276 284   Basic Metabolic Panel Recent Labs    46/56/81 0500 12/21/20 0335  NA 139 138  K 4.1 3.9  CL 101 99  CO2 32 31  GLUCOSE 90 105*  BUN 38* 36*  CREATININE 2.66* 2.52*  CALCIUM 9.1 9.1  MG 2.0 1.9  PHOS 5.4* 4.8*   Liver Function Tests Recent Labs    12/20/20 0500 12/21/20 0335  ALBUMIN 2.8* 2.8*   No results for input(s): LIPASE, AMYLASE in the last 72 hours. Cardiac Enzymes No results for input(s): CKTOTAL, CKMB, CKMBINDEX, TROPONINI in the last 72 hours.  BNP: BNP (last 3 results) Recent Labs    12/02/20 0915  BNP 1,158.5*    ProBNP (last 3 results) No results for input(s): PROBNP in the last 8760 hours.   D-Dimer No results for input(s): DDIMER in the last 72 hours. Hemoglobin A1C No results for input(s): HGBA1C in the last 72 hours. Fasting Lipid Panel No results for input(s): CHOL, HDL, LDLCALC, TRIG, CHOLHDL, LDLDIRECT in the last 72 hours. Thyroid Function Tests No results for input(s): TSH, T4TOTAL, T3FREE, THYROIDAB in the last 72 hours.  Invalid input(s): FREET3  Other results:   Imaging    No results found.   Medications:     Scheduled Medications: . (feeding supplement) PROSource Plus  30 mL Oral BID BM  .  sodium chloride   Intravenous Once  . amiodarone  400 mg Oral BID  . apixaban  5 mg Oral BID  . atorvastatin  10 mg Oral Daily  . B-complex with vitamin C  1 tablet Oral Daily  . Chlorhexidine Gluconate Cloth  6 each Topical Daily  . feeding supplement  237 mL Oral Q24H  . insulin aspart  0-5 Units Subcutaneous QHS  . insulin aspart  0-9 Units Subcutaneous TID WC  . loratadine  10 mg Oral Daily  . midodrine  5 mg Oral TID WC  . montelukast  10 mg Oral QHS  . pantoprazole  40 mg Oral Daily  . senna-docusate  1 tablet Oral BID  . sodium chloride flush  10-40 mL Intracatheter Q12H  . torsemide  40 mg Oral Daily    Infusions: . sodium chloride       PRN Medications: Place/Maintain arterial line **AND** sodium chloride, acetaminophen, ondansetron (ZOFRAN) IV, sodium chloride flush    Assessment/Plan   1. Acute on chronic systolic CHF: Nonischemic cardiomyopathy.  Echo this admission with EF 25-30%, moderate LV dilation, mild LVH, moderate RVE with moderately decreased RV systolic function, severe biatrial enlargement, moderate-severe TR, IVC dilated. EF was low starting in 2016, cath at that time showed mild nonobstructive CAD.  Patient had runs of AF but not sustained when initial diagnosis was made.  Was readmitted in 2018 with persistent AF and CHF, echo with EF 20-25%.  Had DCCV back to NSR at that time.  EF up to 55-60% in 2019.  Lost to followup, now back with progressive dyspnea/volume overload and EF back down in setting of AF with RVR.  Difficult to identify initial etiology of CMP but cannot rule out tachy-mediated.  UDS negative for cocaine. 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  He does not feel AF and we are not sure how long he has been in it.  He was 30+ lbs up with marked volume overload and concern for low output HF (progressive rise in creatinine with attempts at diuresis).  Poor response to initiation of milrinone 0.25 then norepinephrine, creatinine continued to rise and CVVH started 5/12. CVVH stopped 5/20 with good UOP on IV Lasix.  In and out of atrial flutter, currently mainly NSR. CO-OX 59%, CVP 8-9 today and weight decreasing.  - Continue torsemide 40 mg daily.    - Hold off on coronary angiography with renal dysfunction, will need down the road if creatinine continues to come down.   - Want to maintain NSR, continue amiodarone.  - SBP 110-130 on midodrine 5 mg tid.  Will decrease to 2.5 tid - Unable to start GDMT due to renal dysfunction and low BP.   2. Atrial fibrillation/flutter: Admitted with afib/RVR, uncertain how long this has been present.  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  S/p TEE/DCCV 5/16. Prior to DCCV, was in atrial fibrillation. After DCCV, has been in and out of atrial flutter.  Have had trouble keeping him in NSR consistently despite amiodarone gtt.  He was seen by EP, offered AFL ablation now with full AF ablation down the road as outpatient, but he refused procedure.  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  Remains in NSR  - Continue amiodarone will  d/c on 200 mg bid, slow taper down to eventual goal 200 mg daily.   - Continue Eliquis.  - Refuses ablation 3. AKI: Uncertain baseline, creatinine 1.77 at arrival, creatinine up to 3.49 with poor diuresis and CVVH started with intractable volume overload. Now off CVVH.  Creatinine lower at 2.5 this morning.   - Nephrology appreciated.   4. Hematuria: Seen by urology => think old blood in urine, renal ultrasound ok.  Urology saw and recommend outpatient follow up.  5. Iron-deficieny anemia: Maroon stool has been seen. Hgb up after blood 7.8 >9.1>8.9> 9.1 - received feraheme  - Follow closely, transfuse hgb < 8.   6. Deconditioning: PT/OT, ambulate.   Ok for d/c today with f/u in [facility] Clinic  D/c HF meds  Amio 200 bid Apixaban 5 bid  Midodrine 2.5 tid  Torsemide 40 daily  Atorva 10 daily K 20 daily   Arvilla Meres MD 12/21/2020 11:19 AM

## 2020-12-21 NOTE — TOC Transition Note (Signed)
Transition of Care [facility]) - CM/SW Discharge Note   Patient Details  Name: Kevin [name] MRN: [id number] Date of Birth: 1956/04/20  [facility] CM/SW Contact:  Bess Kinds, RN Phone Number: [phone] 12/21/2020, 1:07 PM   Clinical Narrative:     Spoke with patient at the bedside. Advised that [employer] will deliver rollator and 3N1 to bedside. Patient stated that his son will provide transportation home. No further TOC needs identified.   Final next level of care: Home/Self Care Barriers to Discharge: No Barriers Identified   Patient Goals and CMS Choice Patient states their goals for this hospitalization and ongoing recovery are:: to go home      Discharge Placement                       Discharge Plan and Services In-house Referral: Clinical Social Work Discharge Planning Services: CM Consult Post Acute Care Choice: Durable Medical Equipment          DME Arranged: Walker rolling with seat,3-N-1 DME Agency: [employer] Date DME Agency Contacted: 12/21/20 Time DME Agency Contacted: 1245 Representative spoke with at DME Agency: Jasmine HH Arranged: NA HH Agency: NA        Social Determinants of Health (SDOH) Interventions Food Insecurity Interventions: Other (Comment) (Patient reports having food stamps but it would be helpful if he could have groceries delivered as transportation is a concern.) Financial Strain Interventions: Other (Comment) (transportation is a concern and patient reported he cannot always afford public transportation) Housing Interventions: Intervention Not Indicated Transportation Interventions: Retail banker   Readmission Risk Interventions No flowsheet data found.

## 2020-12-21 NOTE — Progress Notes (Signed)
Unna boots removed before discharge with no complications

## 2020-12-21 NOTE — Discharge Instructions (Signed)

## 2020-12-21 NOTE — Progress Notes (Signed)
Mobility Specialist - Progress Note   12/21/20 1142  Mobility  Activity Refused mobility   Pt just got his lunch and doesn't want to walk at this time, will f/u as able if pt does not d/c today.  Mamie Levers Mobility Specialist Mobility Specialist Phone: [phone]

## 2021-01-09 ENCOUNTER — Encounter (HOSPITAL_COMMUNITY): Payer: Medicare Other

## 2021-01-14 ENCOUNTER — Other Ambulatory Visit: Payer: Self-pay | Admitting: Nurse Practitioner

## 2021-01-14 ENCOUNTER — Other Ambulatory Visit: Payer: Self-pay | Admitting: Physician Assistant

## 2021-01-14 MED ORDER — ACCU-CHEK GUIDE VI STRP: ORAL_STRIP | 3 refills | Status: DC

## 2021-01-14 NOTE — Telephone Encounter (Signed)
This is Dr. Hilty pt ?

## 2021-01-22 ENCOUNTER — Ambulatory Visit: Payer: Medicare Other | Admitting: Nurse Practitioner

## 2021-01-22 ENCOUNTER — Ambulatory Visit: Payer: Medicare Other | Admitting: Family Medicine

## 2021-01-27 NOTE — Progress Notes (Addendum)
PCP: none Primary Cardiologist: Dr. Debara Pickett HF Cardiologist: Dr. Aundra Dubin  HPI: 65 y/o male w/ h/o cardiomyopathy dating back to 2016, echo showed EF 25-30%.  At the time, cath showed 40% proximal LAD stenosis. He was noted at the time to have paroxysmal atrial fibrillation but it was not sustained. In 2018, he was admitted with afib/RVR, echo with EF 20-25%.  He had DCCV back to NSR.  ICD was recommended but he declined. Subsequently, echo in 2019 showed EF up to 55-60%.  He had been seen as an outpatient by Dr. Debara Pickett but was lost to followup for about a year.     Presented to [facility] on 5/9 w/ 1-2 months of steadily worsening exertional dyspnea and edema w/ self reported 30 lb wt gain + orthopnea/PND. Patient was noted to be in atrial fibrillation with RVR and volume overloaded. He was started on Lasix with poor response. Echo showed EF 25-30%, moderate LV dilation, mild LVH, moderate RVE with moderately decreased RV systolic function, severe biatrial enlargement, moderate-severe TR, IVC dilated. AHF team consulted and he was placed on milrinone and NE w/ poor response. Placed on CVVH for refractory volume overload. Volume status and renal function improved and able to wean off CVVH and transitioned to PO torsemide. Required TEE w/ DCCV x 2 this admission. EP was consulted and offered ablation but pt refused.  Will continue on PO amio and Eliquis. Unable to tolerate GDMT due to soft BP and CKD. Require midodrine for BP support.   Today he returns for post hospitalization HF follow up. Overall feeling fine. Denies increasing SOB, CP, dizziness, edema, or PND/Orthopnea. Appetite ok. No fever or chills. Weight at home 230 pounds. Taking all medications.   ECG: SR with PACs, QTc 545 ms. Previous QTc 529 ms (personally reviewed).  ROS: All systems negative except as listed in HPI, PMH and Problem List.  SH:  Social History   Socioeconomic History   Marital status: Single    Spouse name: Not on file    Number of children: Not on file   Years of education: Not on file   Highest education level: Not on file  Occupational History    Employer: [employer]  Tobacco Use   Smoking status: Former    Packs/day: 0.50    Years: 40.00    Pack years: 20.00    Types: Cigarettes   Smokeless tobacco: Never   Tobacco comments:    Quit in 2014  Vaping Use   Vaping Use: Never used  Substance and Sexual Activity   Alcohol use: Yes    Alcohol/week: 0.0 standard drinks    Comment: 2-3 drinks per week.   Drug use: Yes    Comment: Not currently. Quit in 2014. 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   Sexual activity: Not Currently  Other Topics Concern   Not on file  Social History Narrative   Not on file   Social Determinants of Health   Financial Resource Strain: Medium Risk   Difficulty of Paying Living Expenses: Somewhat hard  Food Insecurity: No Food Insecurity   Worried About Running Out of Food in the Last Year: Never true   Ran Out of Food in the Last Year: Never true  Transportation Needs: Unmet Transportation Needs   Lack of Transportation (Medical): Yes   Lack of Transportation (Non-Medical): Yes  Physical Activity: Not on file  Stress: Not on file  Social Connections: Not on file  Intimate Partner Violence: Not on file    FH:  Family History  Problem Relation Age of Onset   Hypertension Father     Past Medical History:  Diagnosis Date   Asthma    Chronic systolic CHF (congestive heart failure) ([place])    a. 06/2015 Echo: EF 25-30%, mod-sev MR, PASP 24mHg. b. 08/2016: echo showing EF of 20-25% with diffuse HK   Cocaine use    Essential hypertension    Hyperglycemia 09/2019   Hyperlipidemia 09/2019   [place] persistent [place]    a. Initially dx 06/2015, paroxysmal at that time. b. Recurrent in Feb 2018 and beyond.   Moderate mitral regurgitation    a. 06/2015 Echo: Mod-Sev MR. b. 08/2016: echo showing moderate MR.    NICM (nonischemic cardiomyopathy)  ([place]    a. 06/2015 Echo: EF 25-30%, normal cors by cath - ? Tachy-mediated;     Non-obstructive CAD    a. 06/2015 Cath: LM nl, LAD [id number] nl, RCA nl, EF 25-30%.   Tobacco use    Vitamin D deficiency     Current Outpatient Medications  Medication Sig Dispense Refill   Accu-Chek Softclix Lancets lancets USE UP TO 4 TIMES A DAY AS DIRECTED (Patient taking differently: 1 each See admin instructions. USE UP TO 4 TIMES A DAY AS DIRECTED) 100 each 3   albuterol (PROVENTIL) (2.5 MG/3ML) 0.083% nebulizer solution Take 3 mLs (2.5 mg total) by nebulization every 6 (six) hours as needed for wheezing or shortness of breath. (Patient taking differently: Take 2.5 mg by nebulization See admin instructions. Nebulize 2.5 mg and inhale into the lungs up to six times a day as needed for shortness of breath or wheezing) 75 mL 11   albuterol (VENTOLIN HFA) 108 (90 Base) MCG/ACT inhaler Inhale 2 puffs into the lungs every 6 (six) hours as needed for wheezing or shortness of breath. (Patient taking differently: Inhale 2 puffs into the lungs See admin instructions. Inhale 2 puffs into the lungs up to 10-12 times a day (max) as needed for shortness of breath or wheezing) 18 g 11   amiodarone (PACERONE) 200 MG tablet TAKE 1 TABLET BY MOUTH TWICE A DAY 60 tablet 1   apixaban (ELIQUIS) 5 MG TABS tablet Take 1 tablet (5 mg total) by mouth 2 (two) times daily. 180 tablet 3   atorvastatin (LIPITOR) 10 MG tablet Take 1 tablet (10 mg total) by mouth daily. 90 tablet 3   blood glucose meter kit and supplies Dispense based on patient and insurance preference. Use up to four times daily as directed. (FOR ICD-10 E10.9, E11.9). 1 each 0   cetirizine (ZYRTEC) 10 MG tablet TAKE 1 TABLET BY MOUTH EVERY DAY 90 tablet 1   fluticasone (FLONASE) 50 MCG/ACT nasal spray SPRAY 2 SPRAYS INTO EACH NOSTRIL EVERY DAY (Patient taking differently: Place 1 spray into both nostrils See admin instructions. Instill 1 spray into each nostril up to six  times a day as needed for congestion) 48 mL 2   glipiZIDE (GLUCOTROL) 10 MG tablet Take 1 tablet (10 mg total) by mouth 2 (two) times daily before a meal. 180 tablet 3   glucose blood (ACCU-CHEK GUIDE) test strip TEST UP TO 4 TIMES A DAY AS DIRECTED 100 strip 3   KLOR-CON M20 20 MEQ tablet TAKE 1 TABLET BY MOUTH EVERY DAY 30 tablet 1   lansoprazole (PREVACID) 30 MG capsule Take 1 capsule (30 mg total) by mouth daily as needed (for heartburn/indigestion). 90 capsule 3   metFORMIN (GLUCOPHAGE) 1000 MG tablet Take 1 tablet (  1,000 mg total) by mouth 2 (two) times daily with a meal. 180 tablet 3   midodrine (PROAMATINE) 2.5 MG tablet Take 1 tablet (2.5 mg total) by mouth 3 (three) times daily with meals. 90 tablet 1   montelukast (SINGULAIR) 10 MG tablet Take 1 tablet (10 mg total) by mouth at bedtime. 90 tablet 3   OVER THE COUNTER MEDICATION Place 1-2 drops into both eyes See admin instructions. [place]o Maximum Redness Relief Cooling Eye Drops- Place 1-2 drops into both eyes up to 4 times a day as needed for irritation     sitaGLIPtin (JANUVIA) 25 MG tablet Take 1 tablet (25 mg total) by mouth daily. 90 tablet 11   torsemide (DEMADEX) 20 MG tablet TAKE 2 TABLETS (40 MG) BY MOUTH DAILY. 60 tablet 1   No current facility-administered medications for this encounter.   BP (!) 144/89   Pulse 78   Wt 104.2 kg (229 lb 12.8 oz)   SpO2 98%   BMI 30.32 kg/m   Wt Readings from Last 3 Encounters:  01/29/21 104.2 kg  12/21/20 108.6 kg  04/19/20 115.5 kg   PHYSICAL EXAM:  General:  NAD. No resp difficulty HEENT: Normal Neck: Supple. No JVD. Carotids 2+ bilat; no bruits. No lymphadenopathy or thryomegaly appreciated. Cor: PMI nondisplaced. Regular rate & rhythm. No rubs, gallops or murmurs. Lungs: Clear Abdomen: Soft, nontender, nondistended. No hepatosplenomegaly. No bruits or masses. Good bowel sounds. Extremities: No cyanosis, clubbing, rash, edema Neuro: alert & oriented x 3, cranial nerves  grossly intact. Moves all 4 extremities w/o difficulty. Affect pleasant.  ASSESSMENT & PLAN:  1. Chronic systolic CHF: Nonischemic cardiomyopathy.  Echo this admission with EF 25-30%, moderate LV dilation, mild LVH, moderate RVE with moderately decreased RV systolic function, severe biatrial enlargement, moderate-severe TR, IVC dilated. EF was low starting in 2016, cath at that time showed mild nonobstructive CAD.  Patient had runs of AF but not sustained when initial diagnosis was made.  Was readmitted in 2018 with persistent AF and CHF, echo with EF 20-25%.  Had DCCV back to NSR at that time.  EF up to 55-60% in 2019.  Lost to followup, recently back with progressive dyspnea/volume overload and EF back down in setting of AF with RVR.  Difficult to identify initial etiology of CMP but cannot rule out tachy-mediated.  UDS negative for cocaine. 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  He does not feel AF and we are not sure how long he has been in it.  He was 30+ lbs up with marked volume overload and concern for low output HF (progressive rise in creatinine with attempts at diuresis).  Poor response to initiation of milrinone 0.25, then norepinephrine, creatinine continued to rise requiring CVVH. In and out of atrial flutter, now mainly NSR. He is not volume overloaded today, stable NYHA II symptoms. - Continue torsemide 40 mg daily.   BMET today. - Hold off on coronary angiography with renal dysfunction, will need down the road if creatinine continues to come down.   - Want to maintain NSR, continue amiodarone. - Stop midodrine, BP much improved. - Unable to start GDMT due to renal dysfunction and low BP.   2. Atrial fibrillation/flutter: Admitted with afib/RVR, uncertain how long this has been present.  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 S/p TEE/DCCV 5/16. Prior to DCCV, was in atrial fibrillation. After DCCV, has been in and out of atrial flutter.  Have had trouble keeping  him in NSR consistently despite  amiodarone gtt.  He was seen by EP, offered AFL ablation now with full AF ablation down the road as outpatient, but he refused procedure.  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  Remains in NSR.  - Decrease amiodarone to goal of 200 mg daily.   - Continue Eliquis. No bleeding issues. CBC today. - Refuses ablation. 3. CKD IIIb: Uncertain baseline, creatinine 1.77 at arrival, creatinine up to 3.49 with poor diuresis and CVVH started with intractable volume overload. - BMET today.  - He has not followed up with Nephrology.  I recommended to do so. 4. Hematuria: Seen by urology this admit => think old blood in urine, renal ultrasound ok.  Urology saw and recommend outpatient follow up. - He denies frank blood in urine.  Follow up in 2-3 months with Dr. Newman Nip, FNP-BC 01/29/21

## 2021-01-29 ENCOUNTER — Encounter (HOSPITAL_COMMUNITY): Payer: Self-pay

## 2021-01-29 ENCOUNTER — Ambulatory Visit (HOSPITAL_COMMUNITY)
Admission: RE | Admit: 2021-01-29 | Discharge: 2021-01-29 | Disposition: A | Discharge status: Home / Self Care | Payer: Medicare Other | Source: Ambulatory Visit | Attending: Family Medicine | Admitting: Family Medicine

## 2021-01-29 ENCOUNTER — Other Ambulatory Visit: Payer: Self-pay

## 2021-01-29 ENCOUNTER — Telehealth (HOSPITAL_COMMUNITY): Payer: Self-pay | Admitting: *Deleted

## 2021-01-29 ENCOUNTER — Telehealth (HOSPITAL_COMMUNITY): Payer: Self-pay | Admitting: Family Medicine

## 2021-01-29 VITALS — BP 144/89 | HR 78 | Wt 229.8 lb

## 2021-01-29 DIAGNOSIS — Z7901 Long term (current) use of anticoagulants: Secondary | ICD-10-CM | POA: Insufficient documentation

## 2021-01-29 DIAGNOSIS — R319 Hematuria, unspecified: Secondary | ICD-10-CM | POA: Insufficient documentation

## 2021-01-29 DIAGNOSIS — I130 Hypertensive heart and chronic kidney disease with heart failure and stage 1 through stage 4 chronic kidney disease, or unspecified chronic kidney disease: Secondary | ICD-10-CM | POA: Insufficient documentation

## 2021-01-29 DIAGNOSIS — I071 Rheumatic tricuspid insufficiency: Secondary | ICD-10-CM | POA: Diagnosis not present

## 2021-01-29 DIAGNOSIS — Z596 Low income: Secondary | ICD-10-CM | POA: Diagnosis not present

## 2021-01-29 DIAGNOSIS — I2510 Atherosclerotic heart disease of native coronary artery without angina pectoris: Secondary | ICD-10-CM | POA: Insufficient documentation

## 2021-01-29 DIAGNOSIS — I5022 Chronic systolic (congestive) heart failure: Secondary | ICD-10-CM

## 2021-01-29 DIAGNOSIS — I4892 Unspecified atrial flutter: Secondary | ICD-10-CM | POA: Insufficient documentation

## 2021-01-29 DIAGNOSIS — I428 Other cardiomyopathies: Secondary | ICD-10-CM | POA: Insufficient documentation

## 2021-01-29 DIAGNOSIS — Z79899 Other long term (current) drug therapy: Secondary | ICD-10-CM | POA: Insufficient documentation

## 2021-01-29 DIAGNOSIS — Z87891 Personal history of nicotine dependence: Secondary | ICD-10-CM | POA: Insufficient documentation

## 2021-01-29 DIAGNOSIS — I4891 Unspecified atrial fibrillation: Secondary | ICD-10-CM | POA: Diagnosis not present

## 2021-01-29 DIAGNOSIS — N1832 Chronic kidney disease, stage 3b: Secondary | ICD-10-CM | POA: Diagnosis not present

## 2021-01-29 DIAGNOSIS — Z7984 Long term (current) use of oral hypoglycemic drugs: Secondary | ICD-10-CM | POA: Insufficient documentation

## 2021-01-29 LAB — CBC
HCT: 40.8 % (ref 39.0–52.0)
Hemoglobin: 13.1 g/dL (ref 13.0–17.0)
MCH: 26.3 pg (ref 26.0–34.0)
MCHC: 32.1 g/dL (ref 30.0–36.0)
MCV: 81.9 fL (ref 80.0–100.0)
Platelets: 269 10*3/uL (ref 150–400)
RBC: 4.98 MIL/uL (ref 4.22–5.81)
RDW: 20.1 % — ABNORMAL HIGH (ref 11.5–15.5)
WBC: 8.5 10*3/uL (ref 4.0–10.5)
nRBC: 0 % (ref 0.0–0.2)

## 2021-01-29 LAB — BASIC METABOLIC PANEL
Anion gap: 8 (ref 5–15)
BUN: 20 mg/dL (ref 8–23)
CO2: 27 mmol/L (ref 22–32)
Calcium: 9.5 mg/dL (ref 8.9–10.3)
Chloride: 104 mmol/L (ref 98–111)
Creatinine, Ser: 1.98 mg/dL — ABNORMAL HIGH (ref 0.61–1.24)
GFR, Estimated: 37 mL/min — ABNORMAL LOW (ref >60)
Glucose, Bld: 39 mg/dL — CL (ref 70–99)
Potassium: 3.9 mmol/L (ref 3.5–5.1)
Sodium: 139 mmol/L (ref 135–145)

## 2021-01-29 MED ORDER — AMIODARONE HCL 200 MG PO TABS: 200.0000 mg | ORAL_TABLET | Freq: Every day | ORAL | 3 refills | Status: DC

## 2021-01-29 NOTE — Telephone Encounter (Signed)
Called and spoke with patient regarding his labs today. His blood sugar was dangerously low at 39, said his blood sugar was 59 this morning before his appt. Asked patient to check BS while on phone, re-check was 200. Advised him to keep log of BS and follow up with PCP soon as a BS this low was very dangerous. He voiced understanding.  Prince Rome, FNP-BC

## 2021-01-29 NOTE — Telephone Encounter (Signed)
Received call from the main lab for critical results. Pt Glucose 39. I called pt twice to check on him to see if he has eaten and have him recheck glucose. Pt did not answer. I left message for pt to call back.   Also routed to provider Shanda Bumps [place],FNP

## 2021-01-29 NOTE — Patient Instructions (Signed)
STOP Midodrine DECREASE Amiodarone to 200  mg, one tab daily   Labs today We will only contact you if something comes back abnormal or we need to make some changes. Otherwise no news is good news!   Your physician recommends that you schedule a follow-up appointment in: 3 months with Dr Shirlee Latch  Do the following things EVERYDAY: Weigh yourself in the morning before breakfast. Write it down and keep it in a log. Take your medicines as prescribed Eat low salt foods--Limit salt (sodium) to 2000 mg per day.  Stay as active as you can everyday Limit all fluids for the day to less than 2 liters   At [facility], you and your health needs are our priority. As part of our continuing mission to provide you with exceptional heart care, we have created designated Provider Care Teams. These Care Teams include your primary Cardiologist (physician) and Advanced Practice Providers (APPs- Physician Assistants and Nurse Practitioners) who all work together to provide you with the care you need, when you need it.   You may see any of the following providers on your designated Care Team at your next follow up: Dr Arvilla Meres Dr Marca Ancona Dr Brandon Melnick, NP Robbie Lis, [place] Mikki Santee Karle Plumber, PharmD   Please be sure to bring in all your medications bottles to every appointment.    If you have any questions or concerns before your next appointment please send [place] a message through [place] or call our office at [phone].    TO LEAVE A MESSAGE FOR THE NURSE SELECT OPTION 2, PLEASE LEAVE A MESSAGE INCLUDING: YOUR NAME DATE OF BIRTH CALL BACK NUMBER REASON FOR CALL**this is important as we prioritize the call backs  YOU WILL RECEIVE A CALL BACK THE SAME DAY AS LONG AS YOU CALL BEFORE 4:00 PM

## 2021-02-06 ENCOUNTER — Other Ambulatory Visit: Payer: Self-pay | Admitting: Physician Assistant

## 2021-03-27 ENCOUNTER — Ambulatory Visit: Payer: Medicare Other | Admitting: Nurse Practitioner

## 2021-04-07 ENCOUNTER — Other Ambulatory Visit: Payer: Self-pay

## 2021-04-07 DIAGNOSIS — J45909 Unspecified asthma, uncomplicated: Secondary | ICD-10-CM

## 2021-04-07 MED ORDER — CETIRIZINE HCL 10 MG PO TABS: 10.0000 mg | ORAL_TABLET | Freq: Every day | ORAL | 1 refills | Status: AC

## 2021-04-14 ENCOUNTER — Telehealth: Payer: Self-pay

## 2021-04-14 NOTE — Telephone Encounter (Signed)
Patient called and scheduled medicare wellness visit for Wednesday 04/23/21 at 1800, patient verbalized understanding.

## 2021-04-23 ENCOUNTER — Ambulatory Visit (INDEPENDENT_AMBULATORY_CARE_PROVIDER_SITE_OTHER): Payer: Medicare Other

## 2021-04-23 ENCOUNTER — Other Ambulatory Visit: Payer: Self-pay

## 2021-04-23 DIAGNOSIS — Z0000 Encounter for general adult medical examination without abnormal findings: Secondary | ICD-10-CM | POA: Diagnosis not present

## 2021-04-23 NOTE — Progress Notes (Signed)
Subjective:  I connected with  Kevin [name] on 04/23/21 by a video enabled telemedicine application and verified that I am speaking with the correct person using two identifiers.   I discussed the limitations of evaluation and management by telemedicine. The patient expressed understanding and agreed to proceed.   Kevin [name] is a 65 y.o. male who presents for an Initial Medicare Annual Wellness Visit.  Location of Patient: Home Location of Provider: Office  List any person and their roles  Review of Systems    Defer to PCP   List any person and their role that participating in the visit with the patient. None     Objective:    There were no vitals filed for this visit. There is no height or weight on file to calculate BMI.  Advanced Directives 12/21/2020 12/09/2020 12/02/2020 12/02/2020 04/09/2017 04/04/2017 04/04/2017  Does Patient Have a Medical Advance Directive? No No - No No No No  Would patient like information on creating a medical advance directive? No - Patient declined No - Patient declined No - Patient declined - - No - Patient declined -    Current Medications (verified) Outpatient Encounter Medications as of 04/23/2021  Medication Sig   Accu-Chek Softclix Lancets lancets USE UP TO 4 TIMES A DAY AS DIRECTED (Patient taking differently: 1 each See admin instructions. USE UP TO 4 TIMES A DAY AS DIRECTED)   albuterol (PROVENTIL) (2.5 MG/3ML) 0.083% nebulizer solution Take 3 mLs (2.5 mg total) by nebulization every 6 (six) hours as needed for wheezing or shortness of breath. (Patient taking differently: Take 2.5 mg by nebulization See admin instructions. Nebulize 2.5 mg and inhale into the lungs up to six times a day as needed for shortness of breath or wheezing)   albuterol (VENTOLIN HFA) 108 (90 Base) MCG/ACT inhaler Inhale 2 puffs into the lungs every 6 (six) hours as needed for wheezing or shortness of breath. (Patient taking differently: Inhale 2 puffs into the lungs See admin  instructions. Inhale 2 puffs into the lungs up to 10-12 times a day (max) as needed for shortness of breath or wheezing)   amiodarone (PACERONE) 200 MG tablet Take 1 tablet (200 mg total) by mouth daily.   apixaban (ELIQUIS) 5 MG TABS tablet Take 1 tablet (5 mg total) by mouth 2 (two) times daily.   atorvastatin (LIPITOR) 10 MG tablet Take 1 tablet (10 mg total) by mouth daily.   blood glucose meter kit and supplies Dispense based on patient and insurance preference. Use up to four times daily as directed. (FOR ICD-10 E10.9, E11.9).   cetirizine (ZYRTEC) 10 MG tablet Take 1 tablet (10 mg total) by mouth daily.   fluticasone (FLONASE) 50 MCG/ACT nasal spray SPRAY 2 SPRAYS INTO EACH NOSTRIL EVERY DAY (Patient taking differently: Place 1 spray into both nostrils See admin instructions. Instill 1 spray into each nostril up to six times a day as needed for congestion)   glipiZIDE (GLUCOTROL) 10 MG tablet Take 1 tablet (10 mg total) by mouth 2 (two) times daily before a meal.   glucose blood (ACCU-CHEK GUIDE) test strip TEST UP TO 4 TIMES A DAY AS DIRECTED   KLOR-CON M20 20 MEQ tablet TAKE 1 TABLET BY MOUTH EVERY DAY   lansoprazole (PREVACID) 30 MG capsule Take 1 capsule (30 mg total) by mouth daily as needed (for heartburn/indigestion).   metFORMIN (GLUCOPHAGE) 1000 MG tablet Take 1 tablet (1,000 mg total) by mouth 2 (two) times daily with a meal.  montelukast (SINGULAIR) 10 MG tablet Take 1 tablet (10 mg total) by mouth at bedtime.   OVER THE COUNTER MEDICATION Place 1-2 drops into both eyes See admin instructions. Rohto Maximum Redness Relief Cooling Eye Drops- Place 1-2 drops into both eyes up to 4 times a day as needed for irritation   sitaGLIPtin (JANUVIA) 25 MG tablet Take 1 tablet (25 mg total) by mouth daily.   torsemide (DEMADEX) 20 MG tablet TAKE 2 TABLETS (40 MG) BY MOUTH DAILY.   No facility-administered encounter medications on file as of 04/23/2021.    Allergies (verified) Patient has  no known allergies.   History: Past Medical History:  Diagnosis Date   Asthma    Chronic systolic CHF (congestive heart failure) ([place])    a. 06/2015 Echo: EF 25-30%, mod-sev MR, PASP 11mHg. b. 08/2016: echo showing EF of 20-25% with diffuse HK   Cocaine use    Essential hypertension    Hyperglycemia 09/2019   Hyperlipidemia 09/2019   Longstanding persistent atrial fibrillation ([facility]    a. Initially dx 06/2015, paroxysmal at that time. b. Recurrent in Feb 2018 and beyond.   Moderate mitral regurgitation    a. 06/2015 Echo: Mod-Sev MR. b. 08/2016: echo showing moderate MR.    NICM (nonischemic cardiomyopathy) ([facility]    a. 06/2015 Echo: EF 25-30%, normal cors by cath - ? Tachy-mediated;     Non-obstructive CAD    a. 06/2015 Cath: LM nl, LAD 414mLCX nl, RCA nl, EF 25-30%.   Tobacco use    Vitamin D deficiency    Past Surgical History:  Procedure Laterality Date   CARDIAC CATHETERIZATION N/A 07/26/2015   Procedure: Left Heart Cath and Coronary Angiography;  Surgeon: JaJettie BoozeMD;  [facility];  Service: Cardiovascular;  Laterality: N/A;   CARDIOVERSION N/A 04/09/2017   Procedure: CARDIOVERSION;  Surgeon: RoFay RecordsMD;  [facility];  Service: Cardiovascular;  Laterality: N/A;   CARDIOVERSION N/A 12/09/2020   Procedure: CARDIOVERSION;  Surgeon: McLarey DresserMD;  [facility];  Service: Cardiovascular;  Laterality: N/A;   RIGHT HEART CATH N/A 12/16/2020   Procedure: RIGHT HEART CATH;  Surgeon: McLarey DresserMD;  [place] [facility];  Service: Cardiovascular;  Laterality: N/A;   TEE WITHOUT CARDIOVERSION N/A 04/09/2017   Procedure: TRANSESOPHAGEAL ECHOCARDIOGRAM (TEE);  Surgeon: RoFay RecordsMD;  [place] [facility] Service: Cardiovascular;  Laterality: N/A;   TEE WITHOUT CARDIOVERSION N/A 12/09/2020   Procedure: TRANSESOPHAGEAL ECHOCARDIOGRAM (TEE);  Surgeon: McLarey DresserMD;  [facility];  Service:  Cardiovascular;  Laterality: N/A;   Family History  Problem Relation Age of Onset   Hypertension Father    Social History   Socioeconomic History   Marital status: Single    Spouse name: Not on file   Number of children: Not on file   Years of education: Not on file   Highest education level: Not on file  Occupational History    Employer: [employer]  Tobacco Use   Smoking status: Former    Packs/day: 0.50    Years: 40.00    Pack years: 20.00    Types: Cigarettes   Smokeless tobacco: Never   Tobacco comments:    Quit in 2014  Vaping Use   Vaping Use: Never used  Substance and Sexual Activity   Alcohol use: Yes    Alcohol/week: 0.0 standard drinks    Comment: 2-3 drinks per week.   Drug use: Yes  Comment: Not currently. Quit in 2014. 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   Sexual activity: Not Currently  Other Topics Concern   Not on file  Social History Narrative   Not on file   Social Determinants of Health   Financial Resource Strain: Medium Risk   Difficulty of Paying Living Expenses: Somewhat hard  Food Insecurity: No Food Insecurity   Worried About Running Out of Food in the Last Year: Never true   Ran Out of Food in the Last Year: Never true  Transportation Needs: Unmet Transportation Needs   Lack of Transportation (Medical): Yes   Lack of Transportation (Non-Medical): Yes  Physical Activity: Not on file  Stress: Not on file  Social Connections: Not on file    Tobacco Counseling Counseling given: Not Answered Tobacco comments: Quit in 2014    Clinical Intake: Pre-Visit preparation completed: Yes  Pain:No/Denis pain     Diabetic? Pre-Diabetes     How often do you need to have someone help you when you read instructions,pamphlets, or other written materials from your doctor pharmacy. 1. Never   Interpreter Needed: None     Activities of Daily Living In your present state of health, do you have any difficulty performing the following activities:  12/02/2020  Hearing? N  Vision? N  Difficulty concentrating or making decisions? N  Walking or climbing stairs? N  Dressing or bathing? N  Doing errands, shopping? N  Some recent data might be hidden    Patient Care Team: Pixie Casino, MD as PCP - Cardiology (Cardiology) Constance Haw, MD as Consulting Physician (Cardiology)  Indicate any recent Medical Services you may have received from other than [facility] providers in the past year (date may be approximate).     Assessment:   This is a routine wellness examination for Kevin [name].  Hearing/Vision screen No results found.  Dietary issues and exercise activities discussed:     Goals Addressed   None   Depression Screen PHQ 2/9 Scores 04/19/2020 10/10/2019 06/07/2019 03/31/2019 12/20/2018 08/24/2017 10/12/2016  PHQ - 2 Score 0 0 0 0 0 0 0  Exception Documentation Medical reason - - - - - -    Fall Risk Fall Risk  04/19/2020 10/10/2019 06/07/2019 03/31/2019 12/20/2018  Falls in the past year? 0 0 0 0 0  Number falls in past yr: 0 0 - 0 0  Injury with Fall? 0 0 - 0 0  Risk for fall due to : No Fall Risks - - - -    FALL RISK PREVENTION PERTAINING TO THE HOME:  Any stairs in or around the home? Yes  If so, are there any without handrails? Yes  Home free of loose throw rugs in walkways, pet beds, electrical cords, etc? No  Adequate lighting in your home to reduce risk of falls? No   ASSISTIVE DEVICES UTILIZED TO PREVENT FALLS:  Life alert? No  Use of a cane, walker or w/c? No  Grab bars in the bathroom? No  Shower chair or bench in shower? Yes  Elevated toilet seat or a handicapped toilet? No   TIMED UP AND GO:  Was the test performed? N/A.  Length of time to ambulate N/A   These question can not be answered or access doing telephone call.   Cognitive Function: MMSE - Mini Mental State Exam 06/07/2019  Orientation to time 5  Orientation to Place 5  Registration 3  Attention/ Calculation 5  Recall 3  Language- name  2 objects 2  Language- repeat 1  Language- follow 3 step command 3  Language- read & follow direction 1  Write a sentence 1  Copy design 1  Total score 30        Immunizations Immunization History  Administered Date(s) Administered   Pneumococcal Polysaccharide-23 04/05/2017    TDAP status: Up to date  Flu Vaccine status: Due, Education has been provided regarding the importance of this vaccine. Advised may receive this vaccine at local pharmacy or Health Dept. Aware to provide a copy of the vaccination record if obtained from local pharmacy or Health Dept. Verbalized acceptance and understanding.  Pneumococcal vaccine status: Due, Education has been provided regarding the importance of this vaccine. Advised may receive this vaccine at local pharmacy or Health Dept. Aware to provide a copy of the vaccination record if obtained from local pharmacy or Health Dept. Verbalized acceptance and understanding.  Covid-19 vaccine status: Declined, Education has been provided regarding the importance of this vaccine but patient still declined. Advised may receive this vaccine at local pharmacy or Health Dept.or vaccine clinic. Aware to provide a copy of the vaccination record if obtained from local pharmacy or Health Dept. Verbalized acceptance and understanding.  Qualifies for Shingles Vaccine? Yes   Zostavax completed Yes   Shingrix Completed?: No.    Education has been provided regarding the importance of this vaccine. Patient has been advised to call insurance company to determine out of pocket expense if they have not yet received this vaccine. Advised may also receive vaccine at local pharmacy or Health Dept. Verbalized acceptance and understanding.  Screening Tests Health Maintenance  Topic Date Due   COVID-19 Vaccine (1) Never done   URINE MICROALBUMIN  Never done   Hepatitis C Screening  Never done   TETANUS/TDAP  Never done   Zoster Vaccines- Shingrix (1 of 2) Never done    COLONOSCOPY (Pts 45-45yr Insurance coverage will need to be confirmed)  Never done   INFLUENZA VACCINE  Never done   HPV VACCINES  Aged Out    Health Maintenance  Health Maintenance Due  Topic Date Due   COVID-19 Vaccine (1) Never done   URINE MICROALBUMIN  Never done   Hepatitis C Screening  Never done   TETANUS/TDAP  Never done   Zoster Vaccines- Shingrix (1 of 2) Never done   COLONOSCOPY (Pts 45-472yrInsurance coverage will need to be confirmed)  Never done   INFLUENZA VACCINE  Never done     Lung Cancer Screening: (Low Dose CT Chest recommended if Age 67-80ears, 30 pack-year currently smoking OR have quit w/in 15years.) does qualify.   Lung Cancer Screening Referral: Yes  Additional Screening:  Hepatitis C Screening: does qualify; Completed   Vision Screening: Recommended annual ophthalmology exams for early detection of glaucoma and other disorders of the eye. Is the patient up to date with their annual eye exam?  Yes  Completed 2022 Who is the provider or what is the name of the office in which the patient attends annual eye exams?  If pt is not established with a provider, would they like to be referred to a provider to establish care?  N/A .   Dental Screening: Recommended annual dental exams for proper oral hygiene  Community Resource Referral / Chronic Care Management: CRR required this visit?  No   CCM required this visit?  No      Plan:     I have personally reviewed and noted the following in the patient's chart:  Medical and social history Use of alcohol, tobacco or illicit drugs  Current medications and supplements including opioid prescriptions. Patient is not currently taking opioid prescriptions. Functional ability and status Nutritional status Physical activity Advanced directives List of other physicians Hospitalizations, surgeries, and ER visits in previous 12 months Vitals Screenings to include cognitive, depression, and  falls Referrals and appointments  In addition, I have reviewed and discussed with patient certain preventive protocols, quality metrics, and best practice recommendations. A written personalized care plan for preventive services as well as general preventive health recommendations were provided to patient.     Kevin [name], [place]   04/23/2021    Nurse Notes: Non- Face to Face  30 minutes sent   Kevin [name] , Thank you for taking time to come for your Medicare Wellness Visit. I appreciate your ongoing commitment to your health goals. Please review the following plan we discussed and let me know if I can assist you in the future.   These are the goals we discussed:  Goals   None     This is a list of the screening recommended for you and due dates:  Health Maintenance  Topic Date Due   COVID-19 Vaccine (1) Never done   Urine Protein Check  Never done   Hepatitis C Screening: USPSTF Recommendation to screen - Ages 74-79 yo.  Never done   Tetanus Vaccine  Never done   Zoster (Shingles) Vaccine (1 of 2) Never done   Colon Cancer Screening  Never done   Flu Shot  Never done   HPV Vaccine  Aged Out

## 2021-04-28 ENCOUNTER — Telehealth (HOSPITAL_COMMUNITY): Payer: Self-pay | Admitting: Vascular Surgery

## 2021-04-28 NOTE — Telephone Encounter (Signed)
Returned pt call to resch  10/6 appt w/ [facility]

## 2021-04-29 ENCOUNTER — Other Ambulatory Visit: Payer: Self-pay | Admitting: Physician Assistant

## 2021-05-01 ENCOUNTER — Encounter (HOSPITAL_COMMUNITY): Payer: Medicare Other | Admitting: Cardiology

## 2021-05-09 ENCOUNTER — Encounter: Payer: Self-pay | Admitting: Nurse Practitioner

## 2021-05-09 ENCOUNTER — Other Ambulatory Visit: Payer: Self-pay

## 2021-05-09 ENCOUNTER — Ambulatory Visit (INDEPENDENT_AMBULATORY_CARE_PROVIDER_SITE_OTHER): Payer: Medicare Other | Admitting: Nurse Practitioner

## 2021-05-09 VITALS — BP 133/84 | HR 90 | Temp 98.1°F | Ht 73.0 in | Wt 240.2 lb

## 2021-05-09 DIAGNOSIS — R7309 Other abnormal glucose: Secondary | ICD-10-CM

## 2021-05-09 DIAGNOSIS — Z1159 Encounter for screening for other viral diseases: Secondary | ICD-10-CM | POA: Diagnosis not present

## 2021-05-09 DIAGNOSIS — E1122 Type 2 diabetes mellitus with diabetic chronic kidney disease: Secondary | ICD-10-CM

## 2021-05-09 DIAGNOSIS — Z1211 Encounter for screening for malignant neoplasm of colon: Secondary | ICD-10-CM

## 2021-05-09 DIAGNOSIS — J449 Chronic obstructive pulmonary disease, unspecified: Secondary | ICD-10-CM | POA: Diagnosis not present

## 2021-05-09 DIAGNOSIS — Z794 Long term (current) use of insulin: Secondary | ICD-10-CM | POA: Diagnosis not present

## 2021-05-09 DIAGNOSIS — E1169 Type 2 diabetes mellitus with other specified complication: Secondary | ICD-10-CM

## 2021-05-09 DIAGNOSIS — N183 Chronic kidney disease, stage 3 unspecified: Secondary | ICD-10-CM | POA: Diagnosis not present

## 2021-05-09 DIAGNOSIS — J45909 Unspecified asthma, uncomplicated: Secondary | ICD-10-CM | POA: Diagnosis not present

## 2021-05-09 DIAGNOSIS — E782 Mixed hyperlipidemia: Secondary | ICD-10-CM | POA: Diagnosis not present

## 2021-05-09 DIAGNOSIS — R6889 Other general symptoms and signs: Secondary | ICD-10-CM | POA: Diagnosis not present

## 2021-05-09 DIAGNOSIS — I10 Essential (primary) hypertension: Secondary | ICD-10-CM

## 2021-05-09 DIAGNOSIS — I129 Hypertensive chronic kidney disease with stage 1 through stage 4 chronic kidney disease, or unspecified chronic kidney disease: Secondary | ICD-10-CM | POA: Diagnosis not present

## 2021-05-09 MED ORDER — APIXABAN 5 MG PO TABS: 5.0000 mg | ORAL_TABLET | Freq: Two times a day (BID) | ORAL | 3 refills | Status: DC

## 2021-05-09 MED ORDER — ATORVASTATIN CALCIUM 10 MG PO TABS: 10.0000 mg | ORAL_TABLET | Freq: Every day | ORAL | 3 refills | Status: DC

## 2021-05-09 NOTE — Patient Instructions (Addendum)
Diabetes Mellitus and Nutrition, Adult When you have diabetes, or diabetes mellitus, it is very important to have healthy eating habits because your blood sugar (glucose) levels are greatly affected by what you eat and drink. Eating healthy foods in the right amounts, at about the same times every day, can help you:  Control your blood glucose.  Lower your risk of heart disease.  Improve your blood pressure.  Reach or maintain a healthy weight. What can affect my meal plan? Every person with diabetes is different, and each person has different needs for a meal plan. Your health care provider may recommend that you work with a dietitian to make a meal plan that is best for you. Your meal plan may vary depending on factors such as:  The calories you need.  The medicines you take.  Your weight.  Your blood glucose, blood pressure, and cholesterol levels.  Your activity level.  Other health conditions you have, such as heart or kidney disease. How do carbohydrates affect me? Carbohydrates, also called carbs, affect your blood glucose level more than any other type of food. Eating carbs naturally raises the amount of glucose in your blood. Carb counting is a method for keeping track of how many carbs you eat. Counting carbs is important to keep your blood glucose at a healthy level, especially if you use insulin or take certain oral diabetes medicines. It is important to know how many carbs you can safely have in each meal. This is different for every person. Your dietitian can help you calculate how many carbs you should have at each meal and for each snack. How does alcohol affect me? Alcohol can cause a sudden decrease in blood glucose (hypoglycemia), especially if you use insulin or take certain oral diabetes medicines. Hypoglycemia can be a life-threatening condition. Symptoms of hypoglycemia, such as sleepiness, dizziness, and confusion, are similar to symptoms of having too much  alcohol.  Do not drink alcohol if: ? Your health care provider tells you not to drink. ? You are pregnant, may be pregnant, or are planning to become pregnant.  If you drink alcohol: ? Do not drink on an empty stomach. ? Limit how much you use to:  0-1 drink a day for women.  0-2 drinks a day for men. ? Be aware of how much alcohol is in your drink. In the [place] one drink equals one 12 oz bottle of beer (355 mL), one 5 oz glass of wine (148 mL), or one 1 oz glass of hard liquor (44 mL). ? Keep yourself hydrated with water, diet soda, or unsweetened iced tea.  Keep in mind that regular soda, juice, and other mixers may contain a lot of sugar and must be counted as carbs. What are tips for following this plan? Reading food labels  Start by checking the serving size on the "Nutrition Facts" label of packaged foods and drinks. The amount of calories, carbs, fats, and other nutrients listed on the label is based on one serving of the item. Many items contain more than one serving per package.  Check the total grams (g) of carbs in one serving. You can calculate the number of servings of carbs in one serving by dividing the total carbs by 15. For example, if a food has 30 g of total carbs per serving, it would be equal to 2 servings of carbs.  Check the number of grams (g) of saturated fats and trans fats in one serving. Choose foods that have   a low amount or none of these fats.  Check the number of milligrams (mg) of salt (sodium) in one serving. Most people should limit total sodium intake to less than 2,300 mg per day.  Always check the nutrition information of foods labeled as "low-fat" or "nonfat." These foods may be higher in added sugar or refined carbs and should be avoided.  Talk to your dietitian to identify your daily goals for nutrients listed on the label. Shopping  Avoid buying canned, pre-made, or processed foods. These foods tend to be high in fat, sodium, and added  sugar.  Shop around the outside edge of the grocery store. This is where you will most often find fresh fruits and vegetables, bulk grains, fresh meats, and fresh dairy. Cooking  Use low-heat cooking methods, such as baking, instead of high-heat cooking methods like deep frying.  Cook using healthy oils, such as olive, canola, or sunflower oil.  Avoid cooking with butter, cream, or high-fat meats. Meal planning  Eat meals and snacks regularly, preferably at the same times every day. Avoid going long periods of time without eating.  Eat foods that are high in fiber, such as fresh fruits, vegetables, beans, and whole grains. Talk with your dietitian about how many servings of carbs you can eat at each meal.  Eat 4-6 oz (112-168 g) of lean protein each day, such as lean meat, chicken, fish, eggs, or tofu. One ounce (oz) of lean protein is equal to: ? 1 oz (28 g) of meat, chicken, or fish. ? 1 egg. ?  cup (62 g) of tofu.  Eat some foods each day that contain healthy fats, such as avocado, nuts, seeds, and fish.   What foods should I eat? Fruits Berries. Apples. Oranges. Peaches. Apricots. Plums. Grapes. Mango. Papaya. Pomegranate. Kiwi. Cherries. Vegetables Lettuce. Spinach. Leafy greens, including kale, chard, collard greens, and mustard greens. Beets. Cauliflower. Cabbage. Broccoli. Carrots. Green beans. Tomatoes. Peppers. Onions. Cucumbers. Brussels sprouts. Grains Whole grains, such as whole-wheat or whole-grain bread, crackers, tortillas, cereal, and pasta. Unsweetened oatmeal. Quinoa. Brown or wild rice. Meats and other proteins Seafood. Poultry without skin. Lean cuts of poultry and beef. Tofu. Nuts. Seeds. Dairy Low-fat or fat-free dairy products such as milk, yogurt, and cheese. The items listed above may not be a complete list of foods and beverages you can eat. Contact a dietitian for more information. What foods should I avoid? Fruits Fruits canned with  syrup. Vegetables Canned vegetables. Frozen vegetables with butter or cream sauce. Grains Refined white flour and flour products such as bread, pasta, snack foods, and cereals. Avoid all processed foods. Meats and other proteins Fatty cuts of meat. Poultry with skin. Breaded or fried meats. Processed meat. Avoid saturated fats. Dairy Full-fat yogurt, cheese, or milk. Beverages Sweetened drinks, such as soda or iced tea. The items listed above may not be a complete list of foods and beverages you should avoid. Contact a dietitian for more information. Questions to ask a health care provider  Do I need to meet with a diabetes educator?  Do I need to meet with a dietitian?  What number can I call if I have questions?  When are the best times to check my blood glucose? Where to find more information:  American Diabetes Association: diabetes.org  Academy of Nutrition and Dietetics: www.eatright.org  National Institute of Diabetes and Digestive and Kidney Diseases: www.niddk.nih.gov  Association of Diabetes Care and Education Specialists: www.diabeteseducator.org Summary  It is important to have healthy eating   habits because your blood sugar (glucose) levels are greatly affected by what you eat and drink.  A healthy meal plan will help you control your blood glucose and maintain a healthy lifestyle.  Your health care provider may recommend that you work with a dietitian to make a meal plan that is best for you.  Keep in mind that carbohydrates (carbs) and alcohol have immediate effects on your blood glucose levels. It is important to count carbs and to use alcohol carefully. This information is not intended to replace advice given to you by your health care provider. Make sure you discuss any questions you have with your health care provider. Document Revised: 06/20/2019 Document Reviewed: 06/20/2019 Elsevier Patient Education  2021 [employer]  

## 2021-05-09 NOTE — Progress Notes (Signed)
Kevin [name], [place]  [id number] Phone:  [phone]   Fax:  [phone]   Established Patient Office Visit  Subjective:  Patient ID: Kevin [name], male    DOB: 07/25/1956  Age: 65 y.o. MRN: [id number]  CC:  Chief Complaint  Patient presents with   Follow-up    Pt is here to day for his follow up visit. Pt has tingling and numbness in his left foot.     HPI Kevin [name] presents for follow up. A former patient of NP Stroud. He  has a past medical history of Asthma, Chronic systolic CHF (congestive heart failure) ([place]), Cocaine use, Essential hypertension, Hyperglycemia (09/2019), Hyperlipidemia (09/2019), Longstanding persistent atrial fibrillation (HCC), Moderate mitral regurgitation, NICM (nonischemic cardiomyopathy) ([place]), Non-obstructive CAD, Tobacco use, and Vitamin D deficiency.   He is into today for follow up. He is followed by cardiology for his heart failure, Atrial fibrillation. HTN, CAD.In May 2022 he was diagnosed with acute on chronic HF, Echo indicated Ef of 25-30%. He was as diagnosed with A Fib/RVR S/P TEE/DCCV. He refused the ablation and was treated medically with Amiodarone 200 mg BID.   Diabetes Mellitus Patient presents for follow up of diabetes. Current symptoms include: paresthesia of the feet. He is having foot pain. He describes it as numbness and tinglingSymptoms have stabilized. Patient denies foot ulcerations, increased appetite, nausea, polydipsia, polyuria, visual disturbances, and vomiting. Evaluation to date has included: hemoglobin A1C.  Current treatment: Continued sulfonylurea which has been effective, Continued metformin which has been effective, and Continued statin which has been effective.  .     Past Medical History:  Diagnosis Date   Asthma    Chronic systolic CHF (congestive heart failure) ([place])    a. 06/2015 Echo: EF 25-30%, mod-sev MR, PASP 19mHg. b. 08/2016: echo showing EF of 20-25% with  diffuse HK   Cocaine use    Essential hypertension    Hyperglycemia 09/2019   Hyperlipidemia 09/2019   Longstanding persistent [place] fibrillation [place]    a. Initially dx 06/2015, paroxysmal at that time. b. Recurrent in Feb 2018 and beyond.   Moderate mitral regurgitation    a. 06/2015 Echo: Mod-Sev MR. b. 08/2016: echo showing moderate MR.    NIC[place] (nonischemic cardiomyopathy) ([place]    a. 06/2015 Echo: EF 25-30%, normal cors by cath - ? Tachy-mediated;     Non-obstructive CAD    a. 06/2015 Cath: LM nl, LAD 472mLCX nl, RCA nl, EF 25-30%.   Tobacco use    Vitamin D deficiency     Past Surgical History:  Procedure Laterality Date   CARDIAC CATHETERIZATION N/A 07/26/2015   Procedure: Left Heart Cath and Coronary Angiography;  Surgeon: JaJettie BoozeMD;  [facility];  Service: Cardiovascular;  Laterality: N/A;   CARDIOVERSION N/A 04/09/2017   Procedure: CARDIOVERSION;  Surgeon: RoFay RecordsMD;  [facility];  Service: Cardiovascular;  Laterality: N/A;   CARDIOVERSION N/A 12/09/2020   Procedure: CARDIOVERSION;  Surgeon: McLarey DresserMD;  [facility];  Service: Cardiovascular;  Laterality: N/A;   RIGHT HEART CATH N/A 12/16/2020   Procedure: RIGHT HEART CATH;  Surgeon: McLarey DresserMD;  [facility];  Service: Cardiovascular;  Laterality: N/A;   TEE WITHOUT CARDIOVERSION N/A 04/09/2017   Procedure: TRANSESOPHAGEAL ECHOCARDIOGRAM (TEE);  Surgeon: RoFay RecordsMD;  [facility];  Service: Cardiovascular;  Laterality: N/A;   TEE WITHOUT CARDIOVERSION N/A  12/09/2020   Procedure: TRANSESOPHAGEAL ECHOCARDIOGRAM (TEE);  Surgeon: Larey Dresser, MD;  [facility];  Service: Cardiovascular;  Laterality: N/A;    Family History  Problem Relation Age of Onset   Hypertension Father     Social History   Socioeconomic History   Marital status: Single    Spouse name: Not on file   Number of children:  Not on file   Years of education: Not on file   Highest education level: Not on file  Occupational History    Employer: [employer]  Tobacco Use   Smoking status: Former    Packs/day: 0.50    Years: 40.00    Pack years: 20.00    Types: Cigarettes   Smokeless tobacco: Never   Tobacco comments:    Quit in 2014  Vaping Use   Vaping Use: Never used  Substance and Sexual Activity   Alcohol use: Yes    Alcohol/week: 0.0 standard drinks    Comment: 2-3 drinks per week.   Drug use: Yes    Comment: Not currently. Quit in 2014. 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   Sexual activity: Not Currently  Other Topics Concern   Not on file  Social History Narrative   Not on file   Social Determinants of Health   Financial Resource Strain: Medium Risk   Difficulty of Paying Living Expenses: Somewhat hard  Food Insecurity: No Food Insecurity   Worried About Running Out of Food in the Last Year: Never true   Ran Out of Food in the Last Year: Never true  Transportation Needs: Unmet Transportation Needs   Lack of Transportation (Medical): Yes   Lack of Transportation (Non-Medical): Yes  Physical Activity: Not on file  Stress: Not on file  Social Connections: Not on file  Intimate Partner Violence: Not on file    Outpatient Medications Prior to Visit  Medication Sig Dispense Refill   Accu-Chek Softclix Lancets lancets USE UP TO 4 TIMES A DAY AS DIRECTED (Patient taking differently: 1 each See admin instructions. USE UP TO 4 TIMES A DAY AS DIRECTED) 100 each 3   albuterol (PROVENTIL) (2.5 MG/3ML) 0.083% nebulizer solution Take 3 mLs (2.5 mg total) by nebulization every 6 (six) hours as needed for wheezing or shortness of breath. (Patient taking differently: Take 2.5 mg by nebulization See admin instructions. Nebulize 2.5 mg and inhale into the lungs up to six times a day as needed for shortness of breath or wheezing) 75 mL 11   albuterol (VENTOLIN HFA) 108 (90 Base) MCG/ACT inhaler Inhale 2 puffs into  the lungs every 6 (six) hours as needed for wheezing or shortness of breath. (Patient taking differently: Inhale 2 puffs into the lungs See admin instructions. Inhale 2 puffs into the lungs up to 10-12 times a day (max) as needed for shortness of breath or wheezing) 18 g 11   amiodarone (PACERONE) 200 MG tablet Take 1 tablet (200 mg total) by mouth daily. 30 tablet 3   blood glucose meter kit and supplies Dispense based on patient and insurance preference. Use up to four times daily as directed. (FOR ICD-10 E10.9, E11.9). 1 each 0   cetirizine (ZYRTEC) 10 MG tablet Take 1 tablet (10 mg total) by mouth daily. 90 tablet 1   glipiZIDE (GLUCOTROL) 10 MG tablet Take 1 tablet (10 mg total) by mouth 2 (two) times daily before a meal. 180 tablet 3   glucose blood (ACCU-CHEK GUIDE) test strip TEST UP TO 4 TIMES A DAY AS  DIRECTED 100 strip 3   KLOR-CON M20 20 MEQ tablet TAKE 1 TABLET BY MOUTH EVERY DAY 30 tablet 1   lansoprazole (PREVACID) 30 MG capsule Take 1 capsule (30 mg total) by mouth daily as needed (for heartburn/indigestion). 90 capsule 3   metFORMIN (GLUCOPHAGE) 1000 MG tablet Take 1 tablet (1,000 mg total) by mouth 2 (two) times daily with a meal. 180 tablet 3   montelukast (SINGULAIR) 10 MG tablet Take 1 tablet (10 mg total) by mouth at bedtime. 90 tablet 3   OVER THE COUNTER MEDICATION Place 1-2 drops into both eyes See admin instructions. Ro[place]o Maximum Redness Relief Cooling Eye Drops- Place 1-2 drops into both eyes up to 4 times a day as needed for irritation     sitaGLIPtin (JANUVIA) 25 MG tablet Take 1 tablet (25 mg total) by mouth daily. 90 tablet 11   torsemide (DEMADEX) 20 MG tablet TAKE 2 TABLETS BY MOUTH EVERY DAY 60 tablet 1   apixaban (ELIQUIS) 5 MG TABS tablet Take 1 tablet (5 mg total) by mouth 2 (two) times daily. 180 tablet 3   atorvastatin (LIPITOR) 10 MG tablet Take 1 tablet (10 mg total) by mouth daily. 90 tablet 3   fluticasone (FLONASE) 50 MCG/ACT nasal spray SPRAY 2 SPRAYS INTO  EACH NOSTRIL EVERY DAY (Patient taking differently: Place 1 spray into both nostrils See admin instructions. Instill 1 spray into each nostril up to six times a day as needed for congestion) 48 mL 2   No facility-administered medications prior to visit.    No Known Allergies  ROS Review of Systems    Objective:    Physical Exam Constitutional:      Appearance: He is obese.  HENT:     Head: Normocephalic and atraumatic.  Cardiovascular:     Rate and Rhythm: Normal rate and regular rhythm.     Pulses: Normal pulses.     Heart sounds: Normal heart sounds.  Pulmonary:     Effort: Pulmonary effort is normal.  Abdominal:     Palpations: Abdomen is soft.     Comments: Increased abdominal girth   Musculoskeletal:        General: Normal range of motion.     Cervical back: Normal range of motion.  Skin:    General: Skin is warm and dry.     Capillary Refill: Capillary refill takes less than 2 seconds.  Neurological:     General: No focal deficit present.     Mental Status: He is alert and oriented to person, place, and time.  Psychiatric:        Mood and Affect: Mood normal.        Behavior: Behavior normal.        Thought Content: Thought content normal.        Judgment: Judgment normal.    BP 133/84   Pulse 90   Temp 98.1 F (36.7 C)   Ht _0  (1.854 m)   Wt 240 lb 3.2 oz (109 kg)   SpO2 96%   BMI 31.69 kg/m  Wt Readings from Last 3 Encounters:  05/09/21 240 lb 3.2 oz (109 kg)  01/29/21 229 lb 12.8 oz (104.2 kg)  12/21/20 239 lb 6.7 oz (108.6 kg)     Health Maintenance Due  Topic Date Due   URINE MICROALBUMIN  Never done    There are no preventive care reminders to display for this patient.  Lab Results  Component Value Date   TSH 2.000 10/10/2019  Lab Results  Component Value Date   WBC 8.5 01/29/2021   HGB 13.1 01/29/2021   HCT 40.8 01/29/2021   MCV 81.9 01/29/2021   PLT 269 01/29/2021   Lab Results  Component Value Date   NA 139 01/29/2021    K 3.9 01/29/2021   CO2 27 01/29/2021   GLUCOSE 39 (LL) 01/29/2021   BUN 20 01/29/2021   CREATININE 1.98 (H) 01/29/2021   BILITOT 2.4 (H) 12/02/2020   ALKPHOS 104 12/02/2020   AST 19 12/02/2020   ALT 17 12/02/2020   PROT 7.3 12/02/2020   ALBUMIN 2.8 (L) 12/21/2020   CALCIUM 9.5 01/29/2021   ANIONGAP 8 01/29/2021   Lab Results  Component Value Date   CHOL 68 12/05/2020   Lab Results  Component Value Date   HDL 23 (L) 12/05/2020   Lab Results  Component Value Date   LDLCALC 35 12/05/2020   Lab Results  Component Value Date   TRIG 48 12/05/2020   Lab Results  Component Value Date   CHOLHDL 3.0 12/05/2020   Lab Results  Component Value Date   HGBA1C 5.9 (H) 05/09/2021      Assessment & Plan:   Problem List Items Addressed This Visit       Cardiovascular and Mediastinum   Essential hypertension - Primary Stable Encouraged on going compliance with current medication regimen Encouraged home monitoring and recording BP <130/80 Eating a heart-healthy diet with less salt Encouraged regular physical activity  Recommend Weight loss     Relevant Medications   apixaban (ELIQUIS) 5 MG TABS tablet   atorvastatin (LIPITOR) 10 MG tablet   Other Relevant Orders   Comp. Metabolic Panel (12) (Completed)     Respiratory   COPD (chronic obstructive pulmonary disease) (HCC) Stable   Asthma   Other Visit Diagnoses     Mixed hyperlipidemia     Persistent  Evaluation pending  Heart healthy diet recommended   Relevant Medications   apixaban (ELIQUIS) 5 MG TABS tablet   atorvastatin (LIPITOR) 10 MG tablet   Type 2 DM with CKD stage 3 and hypertension (HCC) Controlled currrent A1c 5.9% Encourage compliance with current treatment regimen  Encourage regular CBG monitoring Encourage contacting office if excessive hyperglycemia and or hypoglycemia Lifestyle modification with healthy diet (fewer calories, more high fiber foods, whole grains and non-starchy vegetables,  lower fat meat and fish, low-fat diary include healthy oils) regular exercise (physical activity) and weight loss Opthalmology exam discussed  Home BP monitoring also encouraged goal <130/80    Relevant Medications   atorvastatin (LIPITOR) 10 MG tablet   Other Relevant Orders   Microalbumin, urine   Hemoglobin A1c (Completed)   Encounter for hepatitis C screening test for low risk patient       Relevant Orders   Hepatitis C antibody (Completed)   Colon cancer screening       Relevant Orders   Cologuard       Meds ordered this encounter  Medications   apixaban (ELIQUIS) 5 MG TABS tablet    Sig: Take 1 tablet (5 mg total) by mouth 2 (two) times daily.    Dispense:  180 tablet    Refill:  3    Order Specific Question:   Supervising Provider    Answer:   Tresa Garter [[id number]]   atorvastatin (LIPITOR) 10 MG tablet    Sig: Take 1 tablet (10 mg total) by mouth daily.    Dispense:  90 tablet    Refill:  3  Order Specific Question:   Supervising Provider    Answer:   Tresa Garter [[id number]]    Follow-up: Return in about 3 months (around 08/09/2021).    Vevelyn Francois, NP

## 2021-05-10 LAB — COMP. METABOLIC PANEL (12)
AST: 13 IU/L (ref 0–40)
Albumin/Globulin Ratio: 1.4 (ref 1.2–2.2)
Albumin: 4.6 g/dL (ref 3.8–4.8)
Alkaline Phosphatase: 126 IU/L — ABNORMAL HIGH (ref 44–121)
BUN/Creatinine Ratio: 12 (ref 10–24)
BUN: 20 mg/dL (ref 8–27)
Bilirubin Total: 0.5 mg/dL (ref 0.0–1.2)
Calcium: 9.7 mg/dL (ref 8.6–10.2)
Chloride: 100 mmol/L (ref 96–106)
Creatinine, Ser: 1.7 mg/dL — ABNORMAL HIGH (ref 0.76–1.27)
Globulin, Total: 3.2 g/dL (ref 1.5–4.5)
Glucose: 107 mg/dL — ABNORMAL HIGH (ref 70–99)
Potassium: 4.4 mmol/L (ref 3.5–5.2)
Sodium: 139 mmol/L (ref 134–144)
Total Protein: 7.8 g/dL (ref 6.0–8.5)
eGFR: 44 mL/min/{1.73_m2} — ABNORMAL LOW (ref >59)

## 2021-05-10 LAB — HEPATITIS C ANTIBODY: Hep C Virus Ab: <0.1 s/co ratio (ref 0.0–0.9)

## 2021-05-10 LAB — HEMOGLOBIN A1C
Est. average glucose Bld gHb Est-mCnc: 123 mg/dL
Hgb A1c MFr Bld: 5.9 % — ABNORMAL HIGH (ref 4.8–5.6)

## 2021-05-13 ENCOUNTER — Encounter: Payer: Self-pay | Admitting: Nurse Practitioner

## 2021-05-13 ENCOUNTER — Other Ambulatory Visit: Payer: Self-pay | Admitting: Physician Assistant

## 2021-05-13 ENCOUNTER — Other Ambulatory Visit: Payer: Self-pay

## 2021-05-13 DIAGNOSIS — T7840XA Allergy, unspecified, initial encounter: Secondary | ICD-10-CM

## 2021-05-13 MED ORDER — FLUTICASONE PROPIONATE 50 MCG/ACT NA SUSP: NASAL | 3 refills | Status: DC

## 2021-05-22 DIAGNOSIS — Z1211 Encounter for screening for malignant neoplasm of colon: Secondary | ICD-10-CM | POA: Diagnosis not present

## 2021-05-27 ENCOUNTER — Other Ambulatory Visit: Payer: Self-pay | Admitting: Physician Assistant

## 2021-05-31 LAB — COLOGUARD: COLOGUARD: POSITIVE — AB

## 2021-06-06 ENCOUNTER — Other Ambulatory Visit: Payer: Self-pay

## 2021-06-06 DIAGNOSIS — R195 Other fecal abnormalities: Secondary | ICD-10-CM

## 2021-06-10 ENCOUNTER — Other Ambulatory Visit: Payer: Self-pay | Admitting: Cardiology

## 2021-06-20 ENCOUNTER — Other Ambulatory Visit: Payer: Self-pay | Admitting: Physician Assistant

## 2021-06-24 ENCOUNTER — Other Ambulatory Visit: Payer: Self-pay | Admitting: Cardiology

## 2021-07-02 ENCOUNTER — Encounter (HOSPITAL_COMMUNITY): Payer: Medicare Other | Admitting: Cardiology

## 2021-07-20 ENCOUNTER — Other Ambulatory Visit: Payer: Self-pay | Admitting: Cardiology

## 2021-08-13 ENCOUNTER — Ambulatory Visit: Payer: Medicare Other | Admitting: Nurse Practitioner

## 2021-08-15 ENCOUNTER — Other Ambulatory Visit: Payer: Self-pay | Admitting: Cardiology

## 2021-08-15 ENCOUNTER — Other Ambulatory Visit: Payer: Self-pay | Admitting: Nurse Practitioner

## 2021-08-22 ENCOUNTER — Telehealth: Payer: Self-pay

## 2021-08-22 NOTE — Telephone Encounter (Signed)
Albuterol

## 2021-08-25 ENCOUNTER — Other Ambulatory Visit: Payer: Self-pay | Admitting: Cardiology

## 2021-08-25 ENCOUNTER — Other Ambulatory Visit: Payer: Self-pay | Admitting: Nurse Practitioner

## 2021-08-25 DIAGNOSIS — J449 Chronic obstructive pulmonary disease, unspecified: Secondary | ICD-10-CM

## 2021-08-29 ENCOUNTER — Telehealth: Payer: Self-pay

## 2021-08-29 NOTE — Telephone Encounter (Signed)
albuterol

## 2021-09-01 ENCOUNTER — Other Ambulatory Visit: Payer: Self-pay

## 2021-09-01 DIAGNOSIS — J449 Chronic obstructive pulmonary disease, unspecified: Secondary | ICD-10-CM

## 2021-09-01 MED ORDER — ALBUTEROL SULFATE HFA 108 (90 BASE) MCG/ACT IN AERS: 2.0000 | INHALATION_SPRAY | Freq: Four times a day (QID) | RESPIRATORY_TRACT | 2 refills | Status: DC | PRN

## 2021-09-04 ENCOUNTER — Encounter: Payer: Self-pay | Admitting: Nurse Practitioner

## 2021-09-04 ENCOUNTER — Ambulatory Visit (INDEPENDENT_AMBULATORY_CARE_PROVIDER_SITE_OTHER): Payer: Medicare Other | Admitting: Nurse Practitioner

## 2021-09-04 ENCOUNTER — Other Ambulatory Visit: Payer: Self-pay

## 2021-09-04 VITALS — BP 147/65 | HR 65 | Temp 98.2°F | Ht 73.0 in | Wt 255.4 lb

## 2021-09-04 DIAGNOSIS — J449 Chronic obstructive pulmonary disease, unspecified: Secondary | ICD-10-CM | POA: Diagnosis not present

## 2021-09-04 DIAGNOSIS — R635 Abnormal weight gain: Secondary | ICD-10-CM

## 2021-09-04 DIAGNOSIS — E1169 Type 2 diabetes mellitus with other specified complication: Secondary | ICD-10-CM | POA: Diagnosis not present

## 2021-09-04 DIAGNOSIS — G8929 Other chronic pain: Secondary | ICD-10-CM | POA: Diagnosis not present

## 2021-09-04 DIAGNOSIS — Z7185 Encounter for immunization safety counseling: Secondary | ICD-10-CM | POA: Diagnosis not present

## 2021-09-04 DIAGNOSIS — J45909 Unspecified asthma, uncomplicated: Secondary | ICD-10-CM | POA: Diagnosis not present

## 2021-09-04 DIAGNOSIS — J302 Other seasonal allergic rhinitis: Secondary | ICD-10-CM

## 2021-09-04 DIAGNOSIS — R590 Localized enlarged lymph nodes: Secondary | ICD-10-CM | POA: Diagnosis not present

## 2021-09-04 DIAGNOSIS — E782 Mixed hyperlipidemia: Secondary | ICD-10-CM | POA: Diagnosis not present

## 2021-09-04 DIAGNOSIS — M545 Low back pain, unspecified: Secondary | ICD-10-CM

## 2021-09-04 DIAGNOSIS — R739 Hyperglycemia, unspecified: Secondary | ICD-10-CM

## 2021-09-04 DIAGNOSIS — Z794 Long term (current) use of insulin: Secondary | ICD-10-CM | POA: Diagnosis not present

## 2021-09-04 DIAGNOSIS — R7309 Other abnormal glucose: Secondary | ICD-10-CM

## 2021-09-04 MED ORDER — METFORMIN HCL 1000 MG PO TABS: 1000.0000 mg | ORAL_TABLET | Freq: Two times a day (BID) | ORAL | 1 refills | Status: DC

## 2021-09-04 MED ORDER — LANSOPRAZOLE 30 MG PO CPDR: 30.0000 mg | DELAYED_RELEASE_CAPSULE | Freq: Every day | ORAL | 1 refills | Status: DC | PRN

## 2021-09-04 MED ORDER — MONTELUKAST SODIUM 10 MG PO TABS: 10.0000 mg | ORAL_TABLET | Freq: Every day | ORAL | 1 refills | Status: DC

## 2021-09-04 MED ORDER — SITAGLIPTIN PHOSPHATE 25 MG PO TABS: 25.0000 mg | ORAL_TABLET | Freq: Every day | ORAL | 1 refills | Status: DC

## 2021-09-04 MED ORDER — ALBUTEROL SULFATE (2.5 MG/3ML) 0.083% IN NEBU: 2.5000 mg | INHALATION_SOLUTION | Freq: Four times a day (QID) | RESPIRATORY_TRACT | 3 refills | Status: DC | PRN

## 2021-09-04 MED ORDER — GLIPIZIDE 10 MG PO TABS: 10.0000 mg | ORAL_TABLET | Freq: Two times a day (BID) | ORAL | 1 refills | Status: DC

## 2021-09-04 NOTE — Progress Notes (Signed)
[place], [place]  [id number] Phone:  [phone]   Fax:  [phone]   Established Patient Office Visit  Subjective:  Patient ID: Kevin [name], male    DOB: 18-Nov-1955  Age: 66 y.o. MRN: [id number]  CC:  Chief Complaint  Patient presents with   Follow-up    Pt is here for 3 month follow up. Pt stated he has been having back pain 4 nights out of the week also is concern about his weight. Pt need a refill for nebulizer     HPI Kevin [name] presents for follow up. He . has a past medical history of Asthma, Chronic systolic CHF (congestive heart failure) ([place]), Cocaine use, Essential hypertension, Hyperglycemia (09/2019), Hyperlipidemia (09/2019), Longstanding persistent atrial fibrillation (HCC), Moderate mitral regurgitation, NICM (nonischemic cardiomyopathy) ([place]), Non-obstructive CAD, Tobacco use, and Vitamin D deficiency.   .Kevin [name] is in today for diabetes follow up. The prescribed treatment is Glipizide, Januiva and Metformin along with statin therapy atorvastatin. The reported use of treatment is  consistent with prescribed. There denies reported side effects from the treatment. Home glucose monitoring indicates a CBG range of  50 to  168.  Denies fever, chills, headache, dizziness, visual changes, polydipsia,  polyphagia shortness of breath, dyspnea on exertion, chest pain, abdominal pain, nausea, vomiting, polyuria, constipation, diarrhea,  any edema, numbness, tingling, burning of hand or feet. There has been a professional eye exam in the year.      Kevin [name] is in today for follow up for Hypertension. The current prescribed treatment is   Torsemide Compliance is reported and home blood pressure monitoring is not done with a  The  DASH diet is being followed. An exercise regimen is ongoing; walking. There is a goal to weight. Denies headache, dizziness, visual changes, shortness of breath, dyspnea on exertion, chest pain, nausea,  vomiting or any edema.   He reports back pain that his progressing. He denies any falls or injures. He feels like this is due to his current mattress. He lives a boarding house; 8 years. He reports the mattress was in place at that time. He endorses a sinking area in the mattress. He is not able to afford and is curious if insurance can assist with a new one.  The back pain has negative impact on his overall health. His breathing is affected, which causes changes in his heart rate. This causes anxiety.   Past Medical History:  Diagnosis Date   Asthma    Chronic systolic CHF (congestive heart failure) ([place])    a. 06/2015 Echo: EF 25-30%, mod-sev MR, PASP 2mHg. b. 08/2016: echo showing EF of 20-25% with diffuse HK   Cocaine use    Essential hypertension    Hyperglycemia 09/2019   Hyperlipidemia 09/2019   Longstanding persistent atrial fibrillation ([place]    a. Initially dx 06/2015, paroxysmal at that time. b. Recurrent in Feb 2018 and beyond.   Moderate mitral regurgitation    a. 06/2015 Echo: Mod-Sev MR. b. 08/2016: echo showing moderate MR.    NIC[place] ([place]ischemic cardiomyopathy) [place]    a. 06/2015 Echo: EF 25-30%, normal cors by cath - ? Tachy-mediated;     Non-obstructive CAD    a. 06/2015 Cath: LM nl, LAD 467mLCX nl, RCA nl, EF 25-30%.   Tobacco use    Vitamin D deficiency     Past Surgical History:  Procedure Laterality Date   CARDIAC CATHETERIZATION N/A 07/26/2015   Procedure: Left  Heart Cath and Coronary Angiography;  Surgeon: Jettie Booze, MD;  Location: [place] [facility];  Service: Cardiovascular;  Laterality: N/A;   CARDIOVERSION N/A 04/09/2017   Procedure: CARDIOVERSION;  Surgeon: Fay Records, MD;  Location: [facility] ENDOSCOPY;  Service: Cardiovascular;  Laterality: N/A;   CARDIOVERSION N/A 12/09/2020   Procedure: CARDIOVERSION;  Surgeon: Larey Dresser, MD;  Location: [facility] ENDOSCOPY;  Service: Cardiovascular;  Laterality: N/A;   RIGHT HEART CATH N/A 12/16/2020    Procedure: RIGHT HEART CATH;  Surgeon: Larey Dresser, MD;  Location: [facility];  Service: Cardiovascular;  Laterality: N/A;   TEE WITHOUT CARDIOVERSION N/A 04/09/2017   Procedure: TRANSESOPHAGEAL ECHOCARDIOGRAM (TEE);  Surgeon: Fay Records, MD;  Location: [place];  Service: Cardiovascular;  Laterality: N/A;   TEE WITHOUT CARDIOVERSION N/A 12/09/2020   Procedure: TRANSESOPHAGEAL ECHOCARDIOGRAM (TEE);  Surgeon: Larey Dresser, MD;  Location: [facility] END[facility]COPY;  Service: Cardiovascular;  Laterality: N/A;    Family History  Problem Relation Age of Onset   Hypertension Father     Social History   Socioeconomic History   Marital status: Single    Spouse name: Not on file   Number of children: Not on file   Years of education: Not on file   Highest education level: Not on file  Occupational History    Employer: [employer]  Tobacco Use   Smoking status: Former    Packs/day: 0.50    Years: 40.00    Pack years: 20.00    Types: Cigarettes   Smokeless tobacco: Never   Tobacco comments:    Quit in 2014  Vaping Use   Vaping Use: Never used  Substance and Sexual Activity   Alcohol use: Yes    Alcohol/week: 0.0 standard drinks    Comment: 2-3 drinks per week.   Drug use: Yes    Comment: Not currently. Quit in 2014. 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   Sexual activity: Not Currently  Other Topics Concern   Not on file  Social History Narrative   Not on file   Social Determinants of Health   Financial Resource Strain: Medium Risk   Difficulty of Paying Living Expenses: Somewhat hard  Food Insecurity: No Food Insecurity   Worried About Running Out of Food in the Last Year: Never true   Ran Out of Food in the Last Year: Never true  Transportation Needs: Unmet Transportation Needs   Lack of Transportation (Medical): Yes   Lack of Transportation (Non-Medical): Yes  Physical Activity: Not on file  Stress: Not on file  Social Connections: Not on file  Intimate Partner  Violence: Not on file    Outpatient Medications Prior to Visit  Medication Sig Dispense Refill   ACCU-CHEK GUIDE test strip TEST UP TO 4 TIMES A DAY AS DIRECTED 100 strip 3   Accu-Chek Softclix Lancets lancets USE UP TO 4 TIMES A DAY AS DIRECTED (Patient taking differently: 1 each See admin instructions. USE UP TO 4 TIMES A DAY AS DIRECTED) 100 each 3   albuterol (VENTOLIN HFA) 108 (90 Base) MCG/ACT inhaler Inhale 2 puffs into the lungs every 6 (six) hours as needed for wheezing or shortness of breath. 18 g 2   apixaban (ELIQUIS) 5 MG TABS tablet Take 1 tablet (5 mg total) by mouth 2 (two) times daily. 180 tablet 3   atorvastatin (LIPITOR) 10 MG tablet Take 1 tablet (10 mg total) by mouth daily. 90 tablet 3   blood glucose meter kit and supplies  Dispense based on patient and insurance preference. Use up to four times daily as directed. (FOR ICD-10 E10.9, E11.9). 1 each 0   cetirizine (ZYRTEC) 10 MG tablet Take 1 tablet (10 mg total) by mouth daily. 90 tablet 1   fluticasone (FLONASE) 50 MCG/ACT nasal spray SPRAY 2 SPRAYS INTO EACH NOSTRIL EVERY DAY 48 mL 3   KLOR-CON M20 20 MEQ tablet TAKE 1 TABLET BY MOUTH EVERY DAY 90 tablet 1   OVER THE COUNTER MEDICATION Place 1-2 drops into both eyes See admin instructions. [place] Maximum Redness Relief Cooling Eye Drops- Place 1-2 drops into both eyes up to 4 times a day as needed for irritation     torsemide (DEMADEX) 20 MG tablet TAKE 2 TABLETS (40 MG TOTAL) BY MOUTH DAILY. NEEDS APPOINTMENT FOR MORE REFILLS 15 tablet 0   albuterol (PROVENTIL) (2.5 MG/3ML) 0.083% nebulizer solution Take 3 mLs (2.5 mg total) by nebulization every 6 (six) hours as needed for wheezing or shortness of breath. (Patient taking differently: Take 2.5 mg by nebulization See admin instructions. Nebulize 2.5 mg and inhale into the lungs up to six times a day as needed for shortness of breath or wheezing) 75 mL 11   glipiZIDE (GLUCOTROL) 10 MG tablet Take 1 tablet (10 mg total) by mouth  2 (two) times daily before a meal. 180 tablet 3   lansoprazole (PREVACID) 30 MG capsule Take 1 capsule (30 mg total) by mouth daily as needed (for heartburn/indigestion). 90 capsule 3   metFORMIN (GLUCOPHAGE) 1000 MG tablet Take 1 tablet (1,000 mg total) by mouth 2 (two) times daily with a meal. 180 tablet 3   montelukast (SINGULAIR) 10 MG tablet Take 1 tablet (10 mg total) by mouth at bedtime. 90 tablet 3   sitaGLIPtin (JANUVIA) 25 MG tablet Take 1 tablet (25 mg total) by mouth daily. 90 tablet 11   amiodarone (PACERONE) 200 MG tablet Take 1 tablet (200 mg total) by mouth daily. (Patient not taking: Reported on 09/04/2021) 30 tablet 3   No facility-administered medications prior to visit.    No Known Allergies  ROS Review of Systems    Objective:    Physical Exam Constitutional:      Appearance: He is obese.  HENT:     Head: Normocephalic and atraumatic.  Cardiovascular:     Rate and Rhythm: Normal rate and regular rhythm.     Pulses: Normal pulses.     Heart sounds: Normal heart sounds.  Pulmonary:     Effort: Pulmonary effort is normal.  Abdominal:     Palpations: Abdomen is soft.     Comments: Increased abdominal girth   Musculoskeletal:        General: Normal range of motion.     Cervical back: Normal range of motion.  Lymphadenopathy:     Cervical: Cervical adenopathy (right) present.  Skin:    General: Skin is warm and dry.     Capillary Refill: Capillary refill takes less than 2 seconds.  Neurological:     General: No focal deficit present.     Mental Status: He is alert and oriented to person, place, and time.  Psychiatric:        Mood and Affect: Mood normal.        Behavior: Behavior normal.        Thought Content: Thought content normal.        Judgment: Judgment normal.    BP (!) 147/65    Pulse 65    Temp 98.2  F (36.8 C)    Ht _0  (1.854 m)    Wt 255 lb 6 oz (115.8 kg)    BMI 33.69 kg/m  Wt Readings from Last 3 Encounters:  09/04/21 255 lb 6 oz  (115.8 kg)  05/09/21 240 lb 3.2 oz (109 kg)  01/29/21 229 lb 12.8 oz (104.2 kg)     Health Maintenance Due  Topic Date Due   URINE MICROALBUMIN  Never done    There are no preventive care reminders to display for this patient.  Lab Results  Component Value Date   TSH 2.000 10/10/2019   Lab Results  Component Value Date   WBC 8.5 01/29/2021   HGB 13.1 01/29/2021   HCT 40.8 01/29/2021   MCV 81.9 01/29/2021   PLT 269 01/29/2021   Lab Results  Component Value Date   NA 139 05/09/2021   K 4.4 05/09/2021   CO2 27 01/29/2021   GLUCOSE 107 (H) 05/09/2021   BUN 20 05/09/2021   CREATININE 1.70 (H) 05/09/2021   BILITOT 0.5 05/09/2021   ALKPHOS 126 (H) 05/09/2021   AST 13 05/09/2021   ALT 17 12/02/2020   PROT 7.8 05/09/2021   ALBUMIN 4.6 05/09/2021   CALCIUM 9.7 05/09/2021   ANIONGAP 8 01/29/2021   EGFR 44 (L) 05/09/2021   Lab Results  Component Value Date   CHOL 68 12/05/2020   Lab Results  Component Value Date   HDL 23 (L) 12/05/2020   Lab Results  Component Value Date   LDLCALC 35 12/05/2020   Lab Results  Component Value Date   TRIG 48 12/05/2020   Lab Results  Component Value Date   CHOLHDL 3.0 12/05/2020   Lab Results  Component Value Date   HGBA1C 5.9 (H) 05/09/2021      Assessment & Plan:   Problem List Items Addressed This Visit       Respiratory   COPD (chronic obstructive pulmonary disease) (HCC) Stable    Relevant Medications   montelukast (SINGULAIR) 10 MG tablet   albuterol (PROVENTIL) (2.5 MG/3ML) 0.083% nebulizer solution   Asthma Stable   Relevant Medications   montelukast (SINGULAIR) 10 MG tablet   albuterol (PROVENTIL) (2.5 MG/3ML) 0.083% nebulizer solution   Other Visit Diagnoses     Type 2 diabetes mellitus with other specified complication, with long-term current use of insulin (HCC)    -  Primary Controlled  Encourage compliance with current treatment regimen  Dose adjustment none Encourage regular CBG  monitoring Encourage contacting office if excessive hyperglycemia and or hypoglycemia Lifestyle modification with healthy diet (fewer calories, more high fiber foods, whole grains and non-starchy vegetables, lower fat meat and fish, low-fat diary include healthy oils) regular exercise (physical activity) and weight loss Opthalmology exam discussed  Nutritional consult recommended Regular dental visits encouraged Home BP monitoring also encouraged goal <130/80    Relevant Medications   sitaGLIPtin (JANUVIA) 25 MG tablet   metFORMIN (GLUCOPHAGE) 1000 MG tablet   glipiZIDE (GLUCOTROL) 10 MG tablet   Other Relevant Orders   Comp. Metabolic Panel (12) (Completed)   Seasonal allergies       Relevant Medications   montelukast (SINGULAIR) 10 MG tablet   Chronic bilateral low back pain without sciatica     Worsening    Relevant Orders   DG Lumbar Spine Complete   Vaccine counseling       Weight gain       Relevant Orders   TSH (Completed)   Mixed hyperlipidemia  Relevant Orders   Lipid panel (Completed)   Enlarged lymph node in neck       Relevant Orders   CBC with Differential/Platelet   CBC with Differential/Platelet (Completed)       Meds ordered this encounter  Medications   sitaGLIPtin (JANUVIA) 25 MG tablet    Sig: Take 1 tablet (25 mg total) by mouth daily.    Dispense:  90 tablet    Refill:  1    Order Specific Question:   Supervising Provider    Answer:   Tresa Garter [[id number]]   montelukast (SINGULAIR) 10 MG tablet    Sig: Take 1 tablet (10 mg total) by mouth at bedtime.    Dispense:  90 tablet    Refill:  1    Order Specific Question:   Supervising Provider    Answer:   Tresa Garter [[id number]]   metFORMIN (GLUCOPHAGE) 1000 MG tablet    Sig: Take 1 tablet (1,000 mg total) by mouth 2 (two) times daily with a meal.    Dispense:  180 tablet    Refill:  1    Order Specific Question:   Supervising Provider    Answer:   Tresa Garter  [[id number]]   lansoprazole (PREVACID) 30 MG capsule    Sig: Take 1 capsule (30 mg total) by mouth daily as needed (for heartburn/indigestion).    Dispense:  90 capsule    Refill:  1    Order Specific Question:   Supervising Provider    Answer:   Tresa Garter [[id number]]   glipiZIDE (GLUCOTROL) 10 MG tablet    Sig: Take 1 tablet (10 mg total) by mouth 2 (two) times daily before a meal.    Dispense:  180 tablet    Refill:  1    Order Specific Question:   Supervising Provider    Answer:   Tresa Garter [[id number]]   albuterol (PROVENTIL) (2.5 MG/3ML) 0.083% nebulizer solution    Sig: Take 3 mLs (2.5 mg total) by nebulization every 6 (six) hours as needed for wheezing or shortness of breath.    Dispense:  75 mL    Refill:  3    Order Specific Question:   Supervising Provider    Answer:   Tresa Garter [id number]    Follow-up: Return in about 3 months (around 12/02/2021) for Follow up [id number], follow up [id number].    Vevelyn Francois, NP

## 2021-09-04 NOTE — Patient Instructions (Signed)
Low Back Sprain or Strain Rehab Ask your health care provider which exercises are safe for you. Do exercises exactly as told by your health care provider and adjust them as directed. It is normal to feel mild stretching, pulling, tightness, or discomfort as you do these exercises. Stop right away if you feel sudden pain or your pain gets worse. Do not begin these exercises until told by your health care provider. Stretching and range-of-motion exercises These exercises warm up your muscles and joints and improve the movement and flexibility of your back. These exercises also help to relieve pain, numbness, and tingling. Lumbar rotation  Lie on your back on a firm bed or the floor with your knees bent. Straighten your arms out to your sides so each arm forms a 90-degree angle (right angle) with a side of your body. Slowly move (rotate) both of your knees to one side of your body until you feel a stretch in your lower back (lumbar). Try not to let your shoulders lift off the floor. Hold this position for __________ seconds. Tense your abdominal muscles and slowly move your knees back to the starting position. Repeat this exercise on the other side of your body. Repeat __________ times. Complete this exercise __________ times a day. Single knee to chest  Lie on your back on a firm bed or the floor with both legs straight. Bend one of your knees. Use your hands to move your knee up toward your chest until you feel a gentle stretch in your lower back and buttock. Hold your leg in this position by holding on to the front of your knee. Keep your other leg as straight as possible. Hold this position for __________ seconds. Slowly return to the starting position. Repeat with your other leg. Repeat __________ times. Complete this exercise __________ times a day. Prone extension on elbows  Lie on your abdomen on a firm bed or the floor (prone position). Prop yourself up on your elbows. Use your arms  to help lift your chest up until you feel a gentle stretch in your abdomen and your lower back. This will place some of your body weight on your elbows. If this is uncomfortable, try stacking pillows under your chest. Your hips should stay down, against the surface that you are lying on. Keep your hip and back muscles relaxed. Hold this position for __________ seconds. Slowly relax your upper body and return to the starting position. Repeat __________ times. Complete this exercise __________ times a day. Strengthening exercises These exercises build strength and endurance in your back. Endurance is the ability to use your muscles for a long time, even after they get tired. Pelvic tilt This exercise strengthens the muscles that lie deep in the abdomen. Lie on your back on a firm bed or the floor with your legs extended. Bend your knees so they are pointing toward the ceiling and your feet are flat on the floor. Tighten your lower abdominal muscles to press your lower back against the floor. This motion will tilt your pelvis so your tailbone points up toward the ceiling instead of pointing to your feet or the floor. To help with this exercise, you may place a small towel under your lower back and try to push your back into the towel. Hold this position for __________ seconds. Let your muscles relax completely before you repeat this exercise. Repeat __________ times. Complete this exercise __________ times a day. Alternating arm and leg raises  Get on your hands  and knees on a firm surface. If you are on a hard floor, you may want to use padding, such as an exercise mat, to cushion your knees. Line up your arms and legs. Your hands should be directly below your shoulders, and your knees should be directly below your hips. Lift your left leg behind you. At the same time, raise your right arm and straighten it in front of you. Do not lift your leg higher than your hip. Do not lift your arm higher  than your shoulder. Keep your abdominal and back muscles tight. Keep your hips facing the ground. Do not arch your back. Keep your balance carefully, and do not hold your breath. Hold this position for __________ seconds. Slowly return to the starting position. Repeat with your right leg and your left arm. Repeat __________ times. Complete this exercise __________ times a day. Abdominal set with straight leg raise  Lie on your back on a firm bed or the floor. Bend one of your knees and keep your other leg straight. Tense your abdominal muscles and lift your straight leg up, 4-6 inches (10-15 cm) off the ground. Keep your abdominal muscles tight and hold this position for __________ seconds. Do not hold your breath. Do not arch your back. Keep it flat against the ground. Keep your abdominal muscles tense as you slowly lower your leg back to the starting position. Repeat with your other leg. Repeat __________ times. Complete this exercise __________ times a day. Single leg lower with bent knees Lie on your back on a firm bed or the floor. Tense your abdominal muscles and lift your feet off the floor, one foot at a time, so your knees and hips are bent in 90-degree angles (right angles). Your knees should be over your hips and your lower legs should be parallel to the floor. Keeping your abdominal muscles tense and your knee bent, slowly lower one of your legs so your toe touches the ground. Lift your leg back up to return to the starting position. Do not hold your breath. Do not let your back arch. Keep your back flat against the ground. Repeat with your other leg. Repeat __________ times. Complete this exercise __________ times a day. Posture and body mechanics Good posture and healthy body mechanics can help to relieve stress in your body's tissues and joints. Body mechanics refers to the movements and positions of your body while you do your daily activities. Posture is part of body  mechanics. Good posture means: Your spine is in its natural S-curve position (neutral). Your shoulders are pulled back slightly. Your head is not tipped forward (neutral). Follow these guidelines to improve your posture and body mechanics in your everyday activities. Standing  When standing, keep your spine neutral and your feet about hip-width apart. Keep a slight bend in your knees. Your ears, shoulders, and hips should line up. When you do a task in which you stand in one place for a long time, place one foot up on a stable object that is 2-4 inches (5-10 cm) high, such as a footstool. This helps keep your spine neutral. Sitting  When sitting, keep your spine neutral and keep your feet flat on the floor. Use a footrest, if necessary, and keep your thighs parallel to the floor. Avoid rounding your shoulders, and avoid tilting your head forward. When working at a desk or a computer, keep your desk at a height where your hands are slightly lower than your elbows. Slide your  chair under your desk so you are close enough to maintain good posture. When working at a computer, place your monitor at a height where you are looking straight ahead and you do not have to tilt your head forward or downward to look at the screen. Resting When lying down and resting, avoid positions that are most painful for you. If you have pain with activities such as sitting, bending, stooping, or squatting, lie in a position in which your body does not bend very much. For example, avoid curling up on your side with your arms and knees near your chest (fetal position). If you have pain with activities such as standing for a long time or reaching with your arms, lie with your spine in a neutral position and bend your knees slightly. Try the following positions: Lying on your side with a pillow between your knees. Lying on your back with a pillow under your knees. Lifting  When lifting objects, keep your feet at least  shoulder-width apart and tighten your abdominal muscles. Bend your knees and hips and keep your spine neutral. It is important to lift using the strength of your legs, not your back. Do not lock your knees straight out. Always ask for help to lift heavy or awkward objects. This information is not intended to replace advice given to you by your health care provider. Make sure you discuss any questions you have with your health care provider. Document Revised: 09/30/2020 Document Reviewed: 09/30/2020 Else[employer] Patient Education  2022 [employer]  Obesity, Adult Obesity is having too much body fat. Being obese means that your weight is more than what is healthy for you.  BMI (body mass index) is a number that explains how much body fat you have. If you have a BMI of 30 or more, you are obese. Obesity can cause serious health problems, such as: Stroke. Coronary artery disease (CAD). Type 2 diabetes. Some types of cancer. High blood pressure (hypertension). High cholesterol. Gallbladder stones. Obesity can also contribute to: Osteoarthritis. Sleep apnea. Infertility problems. What are the causes? Eating meals each day that are high in calories, sugar, and fat. Drinking a lot of drinks that have sugar in them. Being born with genes that may make you more likely to become obese. Having a medical condition that causes obesity. Taking certain medicines. Sitting a lot (having a sedentary lifestyle). Not getting enough sleep. What increases the risk? Having a family history of obesity. Living in an area with limited access to: [place], recreation centers, or sidewalks. Healthy food choices, such as grocery stores and farmers' markets. What are the signs or symptoms? The main sign is having too much body fat. How is this treated? Treatment for this condition often includes changing your lifestyle. Treatment may include: Changing your diet. This may include making a healthy meal  plan. Exercise. This may include activity that causes your heart to beat faster (aerobic exercise) and strength training. Work with your doctor to design a program that works for you. Medicine to help you lose weight. This may be used if you are not able to lose one pound a week after 6 weeks of healthy eating and more exercise. Treating conditions that cause the obesity. Surgery. Options may include gastric banding and gastric bypass. This may be done if: Other treatments have not helped to improve your condition. You have a BMI of 40 or higher. You have life-threatening health problems related to obesity. Follow these instructions at home: Eating and drinking  Follow advice from your doctor about what to eat and drink. Your doctor may tell you to: Limit fast food, sweets, and processed snack foods. Choose low-fat options. For example, choose low-fat milk instead of whole milk. Eat five or more servings of fruits or vegetables each day. Eat at home more often. This gives you more control over what you eat. Choose healthy foods when you eat out. Learn to read food labels. This will help you learn how much food is in one serving. Keep low-fat snacks available. Avoid drinks that have a lot of sugar in them. These include soda, fruit juice, iced tea with sugar, and flavored milk. Drink enough water to keep your pee (urine) pale yellow. Do not go on fad diets. Physical activity Exercise often, as told by your doctor. Most adults should get up to 150 minutes of moderate-intensity exercise every week.Ask your doctor: What types of exercise are safe for you. How often you should exercise. Warm up and stretch before being active. Do slow stretching after being active (cool down). Rest between times of being active. Lifestyle Work with your doctor and a food expert (dietitian) to set a weight-loss goal that is best for you. Limit your screen time. Find ways to reward yourself that do not  involve food. Do not drink alcohol if: Your doctor tells you not to drink. You are pregnant, may be pregnant, or are planning to become pregnant. If you drink alcohol: Limit how much you have to: 0-1 drink a day for women. 0-2 drinks a day for men. Know how much alcohol is in your drink. In the [place] one drink equals one 12 oz bottle of beer (355 mL), one 5 oz glass of wine (148 mL), or one 1 oz glass of hard liquor (44 mL). General instructions Keep a weight-loss journal. This can help you keep track of: The food that you eat. How much exercise you get. Take over-the-counter and prescription medicines only as told by your doctor. Take vitamins and supplements only as told by your doctor. Think about joining a support group. Pay attention to your mental health as obesity can lead to depression or self esteem issues. Keep all follow-up visits. Contact a doctor if: You cannot meet your weight-loss goal after you have changed your diet and lifestyle for 6 weeks. You are having trouble breathing. Summary Obesity is having too much body fat. Being obese means that your weight is more than what is healthy for you. Work with your doctor to set a weight-loss goal. Get regular exercise as told by your doctor. This information is not intended to replace advice given to you by your health care provider. Make sure you discuss any questions you have with your health care provider. Document Revised: 02/18/2021 Document Reviewed: 02/18/2021 Elsevier Patient Education  2022 [employer].

## 2021-09-05 LAB — LIPID PANEL
Chol/HDL Ratio: 3.6 ratio (ref 0.0–5.0)
Cholesterol, Total: 136 mg/dL (ref 100–199)
HDL: 38 mg/dL — ABNORMAL LOW (ref >39)
LDL Chol Calc (NIH): 69 mg/dL (ref 0–99)
Triglycerides: 171 mg/dL — ABNORMAL HIGH (ref 0–149)
VLDL Cholesterol Cal: 29 mg/dL (ref 5–40)

## 2021-09-05 LAB — COMP. METABOLIC PANEL (12)
AST: 13 IU/L (ref 0–40)
Albumin/Globulin Ratio: 1.3 (ref 1.2–2.2)
Albumin: 4.3 g/dL (ref 3.8–4.8)
Alkaline Phosphatase: 132 IU/L — ABNORMAL HIGH (ref 44–121)
BUN/Creatinine Ratio: 13 (ref 10–24)
BUN: 25 mg/dL (ref 8–27)
Bilirubin Total: 0.5 mg/dL (ref 0.0–1.2)
Calcium: 9.4 mg/dL (ref 8.6–10.2)
Chloride: 100 mmol/L (ref 96–106)
Creatinine, Ser: 1.94 mg/dL — ABNORMAL HIGH (ref 0.76–1.27)
Globulin, Total: 3.2 g/dL (ref 1.5–4.5)
Glucose: 111 mg/dL — ABNORMAL HIGH (ref 70–99)
Potassium: 4.3 mmol/L (ref 3.5–5.2)
Sodium: 144 mmol/L (ref 134–144)
Total Protein: 7.5 g/dL (ref 6.0–8.5)
eGFR: 37 mL/min/{1.73_m2} — ABNORMAL LOW (ref >59)

## 2021-09-05 LAB — CBC WITH DIFFERENTIAL/PLATELET
Basophils Absolute: 0.1 10*3/uL (ref 0.0–0.2)
Basos: 1 %
EOS (ABSOLUTE): 0.2 10*3/uL (ref 0.0–0.4)
Eos: 3 %
Hematocrit: 41 % (ref 37.5–51.0)
Hemoglobin: 13.4 g/dL (ref 13.0–17.7)
Immature Grans (Abs): 0 10*3/uL (ref 0.0–0.1)
Immature Granulocytes: 0 %
Lymphocytes Absolute: 2 10*3/uL (ref 0.7–3.1)
Lymphs: 23 %
MCH: 26.6 pg (ref 26.6–33.0)
MCHC: 32.7 g/dL (ref 31.5–35.7)
MCV: 82 fL (ref 79–97)
Monocytes Absolute: 0.7 10*3/uL (ref 0.1–0.9)
Monocytes: 9 %
Neutrophils Absolute: 5.4 10*3/uL (ref 1.4–7.0)
Neutrophils: 64 %
Platelets: 220 10*3/uL (ref 150–450)
RBC: 5.03 x10E6/uL (ref 4.14–5.80)
RDW: 13.3 % (ref 11.6–15.4)
WBC: 8.4 10*3/uL (ref 3.4–10.8)

## 2021-09-05 LAB — TSH: TSH: 2.57 u[IU]/mL (ref 0.450–4.500)

## 2021-09-05 NOTE — Telephone Encounter (Signed)
Done

## 2021-09-07 ENCOUNTER — Encounter: Payer: Self-pay | Admitting: Nurse Practitioner

## 2021-09-16 ENCOUNTER — Other Ambulatory Visit: Payer: Self-pay

## 2021-09-19 ENCOUNTER — Other Ambulatory Visit: Payer: Self-pay | Admitting: Nurse Practitioner

## 2021-09-19 DIAGNOSIS — J45909 Unspecified asthma, uncomplicated: Secondary | ICD-10-CM

## 2021-09-19 DIAGNOSIS — J449 Chronic obstructive pulmonary disease, unspecified: Secondary | ICD-10-CM

## 2021-10-09 ENCOUNTER — Other Ambulatory Visit: Payer: Self-pay | Admitting: Nurse Practitioner

## 2021-10-23 ENCOUNTER — Other Ambulatory Visit: Payer: Self-pay

## 2021-10-23 DIAGNOSIS — J449 Chronic obstructive pulmonary disease, unspecified: Secondary | ICD-10-CM

## 2021-10-23 MED ORDER — ALBUTEROL SULFATE HFA 108 (90 BASE) MCG/ACT IN AERS: 2.0000 | INHALATION_SPRAY | Freq: Four times a day (QID) | RESPIRATORY_TRACT | 0 refills | Status: DC | PRN

## 2021-11-14 ENCOUNTER — Other Ambulatory Visit: Payer: Self-pay | Admitting: Nurse Practitioner

## 2021-11-14 DIAGNOSIS — J449 Chronic obstructive pulmonary disease, unspecified: Secondary | ICD-10-CM

## 2021-11-20 ENCOUNTER — Telehealth: Payer: Self-pay

## 2021-11-20 NOTE — Telephone Encounter (Signed)
Torsemide 

## 2021-11-21 ENCOUNTER — Other Ambulatory Visit (HOSPITAL_COMMUNITY): Payer: Self-pay | Admitting: *Deleted

## 2021-11-21 MED ORDER — TORSEMIDE 20 MG PO TABS: 40.0000 mg | ORAL_TABLET | Freq: Every day | ORAL | 3 refills | Status: DC

## 2021-11-21 NOTE — Telephone Encounter (Signed)
Can refill. 

## 2021-11-21 NOTE — Telephone Encounter (Signed)
refill sent 

## 2021-11-25 ENCOUNTER — Other Ambulatory Visit: Payer: Self-pay | Admitting: Cardiology

## 2021-12-03 ENCOUNTER — Ambulatory Visit: Payer: Medicare Other | Admitting: Nurse Practitioner

## 2022-01-07 ENCOUNTER — Telehealth: Payer: Self-pay

## 2022-01-07 NOTE — Telephone Encounter (Signed)
Albuterol sulfate for [employer]

## 2022-01-08 NOTE — Telephone Encounter (Signed)
Patient needs to be seen before refills are given. Patient has not been seen since February 2023.

## 2022-01-21 ENCOUNTER — Encounter: Payer: Self-pay | Admitting: Nurse Practitioner

## 2022-01-21 ENCOUNTER — Ambulatory Visit (INDEPENDENT_AMBULATORY_CARE_PROVIDER_SITE_OTHER): Payer: Medicare Other | Admitting: Nurse Practitioner

## 2022-01-21 VITALS — BP 111/81 | HR 98 | Temp 98.1°F | Ht 73.0 in | Wt 258.0 lb

## 2022-01-21 DIAGNOSIS — Z794 Long term (current) use of insulin: Secondary | ICD-10-CM

## 2022-01-21 DIAGNOSIS — J449 Chronic obstructive pulmonary disease, unspecified: Secondary | ICD-10-CM | POA: Diagnosis not present

## 2022-01-21 DIAGNOSIS — I509 Heart failure, unspecified: Secondary | ICD-10-CM

## 2022-01-21 DIAGNOSIS — J45909 Unspecified asthma, uncomplicated: Secondary | ICD-10-CM | POA: Diagnosis not present

## 2022-01-21 DIAGNOSIS — M5440 Lumbago with sciatica, unspecified side: Secondary | ICD-10-CM | POA: Diagnosis not present

## 2022-01-21 DIAGNOSIS — E1169 Type 2 diabetes mellitus with other specified complication: Secondary | ICD-10-CM

## 2022-01-21 DIAGNOSIS — J302 Other seasonal allergic rhinitis: Secondary | ICD-10-CM

## 2022-01-21 DIAGNOSIS — K219 Gastro-esophageal reflux disease without esophagitis: Secondary | ICD-10-CM | POA: Diagnosis not present

## 2022-01-21 LAB — POCT GLYCOSYLATED HEMOGLOBIN (HGB A1C)
HbA1c POC (<> result, manual entry): 6 % (ref 4.0–5.6)
HbA1c, POC (controlled diabetic range): 6 % (ref 0.0–7.0)
HbA1c, POC (prediabetic range): 6 % (ref 5.7–6.4)
Hemoglobin A1C: 6 % — AB (ref 4.0–5.6)

## 2022-01-21 MED ORDER — METFORMIN HCL 1000 MG PO TABS
1000.0000 mg | ORAL_TABLET | Freq: Two times a day (BID) | ORAL | 1 refills | Status: DC
Start: 2022-01-21 — End: 2022-02-16

## 2022-01-21 MED ORDER — ALBUTEROL SULFATE HFA 108 (90 BASE) MCG/ACT IN AERS: 2.0000 | INHALATION_SPRAY | RESPIRATORY_TRACT | 1 refills | Status: DC | PRN

## 2022-01-21 MED ORDER — TORSEMIDE 20 MG PO TABS
40.0000 mg | ORAL_TABLET | Freq: Every day | ORAL | 3 refills | Status: DC
Start: 2022-01-21 — End: 2022-02-04

## 2022-01-21 MED ORDER — ALBUTEROL SULFATE (2.5 MG/3ML) 0.083% IN NEBU: 2.5000 mg | INHALATION_SOLUTION | Freq: Four times a day (QID) | RESPIRATORY_TRACT | 3 refills | Status: AC | PRN

## 2022-01-21 MED ORDER — MONTELUKAST SODIUM 10 MG PO TABS
10.0000 mg | ORAL_TABLET | Freq: Every day | ORAL | 1 refills | Status: DC
Start: 2022-01-21 — End: 2022-02-16

## 2022-01-21 MED ORDER — CYCLOBENZAPRINE HCL 10 MG PO TABS: 10.0000 mg | ORAL_TABLET | Freq: Three times a day (TID) | ORAL | 0 refills | Status: AC | PRN

## 2022-01-21 MED ORDER — LANSOPRAZOLE 30 MG PO CPDR: 30.0000 mg | DELAYED_RELEASE_CAPSULE | Freq: Every day | ORAL | 1 refills | Status: DC | PRN

## 2022-01-21 NOTE — Patient Instructions (Signed)
You were seen today in the [facility] for reevaluation of chronic illness. Labs were collected, results will be available via MyChart or, if abnormal, you will be contacted by clinic staff. You were prescribed medications, please take as directed. Please follow up in 3 mths for reevaluation of chronic illness

## 2022-01-21 NOTE — Progress Notes (Signed)
[place], [place]  [id number] Phone:  [phone]   Fax:  [phone] Subjective:   Patient ID: Kevin [name], male    DOB: 07-28-1955, 67 y.o.   MRN: [id number]  Chief Complaint  Patient presents with   Follow-up    3 month follow up;Diabetes and Hypertension Patient states that he sneezed the other day and was leaning on his side in the chair and now he has been having muscle spasms on the lower left side of his back.   HPI Kevin [name] 66 y.o. male  has a past medical history of Asthma, Chronic systolic CHF (congestive heart failure) ([place]), Cocaine use, Essential hypertension, Hyperglycemia (09/2019), Hyperlipidemia (09/2019), Longstanding persistent atrial fibrillation (HCC), Moderate mitral regurgitation, NICM (nonischemic cardiomyopathy) ([place]), Non-obstructive CAD, Tobacco use, and Vitamin D deficiency. To the [facility] for reevaluation of DM and HTN.  Diabetes Mellitus: Patient presents for follow up of diabetes. Symptoms: none. Denies any symptoms. Patient denies none.  Evaluation to date has been included: hemoglobin A1C.  Home sugars: BGs range between 100 and 180 . Treatment to date: no recent interventions.    Hypertension: Patient here for follow-up of elevated blood pressure. He is not exercising and is adherent to low salt diet. Does not monitor B/P a home.  Cardiac symptoms none. Patient denies chest pain, dyspnea, fatigue, and orthopnea.  Cardiovascular risk factors: advanced age (older than 4 for men, 53 for women), hypertension, and male gender. Use of agents associated with hypertension: none. History of target organ damage: none.   States that he has had left lower back spasms for several weeks, requesting prescription for muscle relaxant. States that pain worsened with sneezing today.  Requesting medication refill, but denies any other concerns today.   Denies any fatigue, chest pain, shortness of breath, HA or dizziness.  Denies any blurred vision, numbness or tingling.   Past Medical History:  Diagnosis Date   Asthma    Chronic systolic CHF (congestive heart failure) ([place])    a. 06/2015 Echo: EF 25-30%, mod-sev MR, PASP 80mHg. b. 08/2016: echo showing EF of 20-25% with diffuse HK   Cocaine use    Essential hypertension    Hyperglycemia 09/2019   Hyperlipidemia 09/2019   [place]    a. Initially dx 06/2015, paroxysmal at that time. b. Recurrent in Feb 2018 and beyond.   Moderate mitral regurgitation    a. 06/2015 Echo: Mod-Sev MR. b. 08/2016: echo showing moderate MR.    [place]mic [place]) [place]    a. 06/2015 Echo: EF 25-30%, normal cors by cath - ? Tachy-mediated;     Non-obstructive CAD    a. 06/2015 Cath: LM nl, LAD 447mLCX nl, RCA nl, EF 25-30%.   Tobacco use    Vitamin D deficiency     Past Surgical History:  Procedure Laterality Date   CARDIAC CATHETERIZATION N/A 07/26/2015   Procedure: Left Heart Cath and Coronary Angiography;  Surgeon: JaJettie BoozeMD;  [facility];  Service: Cardiovascular;  Laterality: N/A;   CARDIOVERSION N/A 04/09/2017   Procedure: CARDIOVERSION;  Surgeon: RoFay RecordsMD;  [facility];  Service: Cardiovascular;  Laterality: N/A;   CARDIOVERSION N/A 12/09/2020   Procedure: CARDIOVERSION;  Surgeon: McLarey DresserMD;  [facility] Service: Cardiovascular;  Laterality: N/A;   RIGHT HEART CATH N/A 12/16/2020   Procedure: RIGHT HEART CATH;  Surgeon: McLarey DresserMD;  Location:  [place] INVASIVE CV LAB;  Service: Cardiovascular;  Laterality: N/A;   TEE WITHOUT CARDIOVERSION N/A 04/09/2017   Procedure: TRANSESOPHAGEAL ECHOCARDIOGRAM (TEE);  Surgeon: Fay Records, MD;  Location: [place];  Service: Cardiovascular;  Laterality: N/A;   TEE WITHOUT CARDIOVERSION N/A 12/09/2020   Procedure: TRANSESOPHAGEAL ECHOCARDIOGRAM (TEE);  Surgeon: Larey Dresser, MD;  [facility]  [facility];  Service: Cardiovascular;  Laterality: N/A;    Family History  Problem Relation Age of Onset   Hypertension Father     Social History   Socioeconomic History   Marital status: Single    Spouse name: Not on file   Number of children: Not on file   Years of education: Not on file   Highest education level: Not on file  Occupational History    Employer: [employer]  Tobacco Use   Smoking status: Former    Packs/day: 0.50    Years: 40.00    Total pack years: 20.00    Types: Cigarettes   Smokeless tobacco: Never   Tobacco comments:    Quit in 2014  Vaping Use   Vaping Use: Never used  Substance and Sexual Activity   Alcohol use: Yes    Alcohol/week: 0.0 standard drinks of alcohol    Comment: 2-3 drinks per week.   Drug use: Yes    Comment: Not currently. Quit in 2014. 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   Sexual activity: Not Currently  Other Topics Concern   Not on file  Social History Narrative   Not on file   Social Determinants of Health   Financial Resource Strain: Medium Risk (12/09/2020)   Overall Financial Resource Strain (CARDIA)    Difficulty of Paying Living Expenses: Somewhat hard  Food Insecurity: No Food Insecurity (12/09/2020)   Hunger Vital Sign    Worried About Running Out of Food in the Last Year: Never true    Ran Out of Food in the Last Year: Never true  Transportation Needs: Unmet Transportation Needs (12/09/2020)   PRAPARE - Hydrologist (Medical): Yes    Lack of Transportation (Non-Medical): Yes  Physical Activity: Not on file  Stress: Not on file  Social Connections: Not on file  Intimate Partner Violence: Not on file    Outpatient Medications Prior to Visit  Medication Sig Dispense Refill   ACCU-CHEK GUIDE test strip TEST UP TO 4 TIMES A DAY AS DIRECTED 100 strip 3   Accu-Chek Softclix Lancets lancets USE UP TO 4 TIMES A DAY AS DIRECTED (Patient taking differently: 1 each See admin instructions. USE UP  TO 4 TIMES A DAY AS DIRECTED) 100 each 3   apixaban (ELIQUIS) 5 MG TABS tablet Take 1 tablet (5 mg total) by mouth 2 (two) times daily. 180 tablet 3   atorvastatin (LIPITOR) 10 MG tablet Take 1 tablet (10 mg total) by mouth daily. 90 tablet 3   blood glucose meter kit and supplies Dispense based on patient and insurance preference. Use up to four times daily as directed. (FOR ICD-10 E10.9, E11.9). 1 each 0   cetirizine (ZYRTEC) 10 MG tablet Take 1 tablet (10 mg total) by mouth daily. 90 tablet 1   fluticasone (FLONASE) 50 MCG/ACT nasal spray SPRAY 2 SPRAYS INTO EACH NOSTRIL EVERY DAY 48 mL 3   OVER THE COUNTER MEDICATION Place 1-2 drops into both eyes See admin instructions. [place] Maximum Redness Relief Cooling Eye Drops- Place 1-2 drops into both eyes up to 4 times a day as needed for  irritation     potassium chloride SA (KLOR-CON M) 20 MEQ tablet Take 1 tablet (20 mEq total) by mouth daily. NEEDS FOLLOW UP APPOINTMENT FOR ANYMORE REFILLS 60 tablet 0   sitaGLIPtin (JANUVIA) 25 MG tablet Take 1 tablet (25 mg total) by mouth daily. 90 tablet 1   albuterol (PROVENTIL) (2.5 MG/3ML) 0.083% nebulizer solution Take 3 mLs (2.5 mg total) by nebulization every 6 (six) hours as needed for wheezing or shortness of breath. 75 mL 3   albuterol (VENTOLIN HFA) 108 (90 Base) MCG/ACT inhaler INHALE TWO PUFFS BY MOUTH EVERY 6 HOURS AS NEEDED FOR WHEEZING OR FOR SHORTNESS OF BREATH (BULK) 8.5 g 1   torsemide (DEMADEX) 20 MG tablet Take 2 tablets (40 mg total) by mouth daily. 60 tablet 3   amiodarone (PACERONE) 200 MG tablet Take 1 tablet (200 mg total) by mouth daily. (Patient not taking: Reported on 09/04/2021) 30 tablet 3   glipiZIDE (GLUCOTROL) 10 MG tablet Take 1 tablet (10 mg total) by mouth 2 (two) times daily before a meal. 180 tablet 1   lansoprazole (PREVACID) 30 MG capsule Take 1 capsule (30 mg total) by mouth daily as needed (for heartburn/indigestion). 90 capsule 1   metFORMIN (GLUCOPHAGE) 1000 MG tablet Take  1 tablet (1,000 mg total) by mouth 2 (two) times daily with a meal. 180 tablet 1   montelukast (SINGULAIR) 10 MG tablet Take 1 tablet (10 mg total) by mouth at bedtime. 90 tablet 1   No facility-administered medications prior to visit.    No Known Allergies  Review of Systems  Constitutional:  Negative for chills, fever and malaise/fatigue.  Eyes: Negative.   Respiratory:  Negative for cough and shortness of breath.   Cardiovascular:  Negative for chest pain, palpitations and leg swelling.  Gastrointestinal:  Negative for abdominal pain, blood in stool, constipation, diarrhea, nausea and vomiting.  Musculoskeletal:  Positive for back pain. Negative for falls, joint pain, myalgias and neck pain.  Skin: Negative.   Neurological: Negative.   Psychiatric/Behavioral:  Negative for depression. The patient is not nervous/anxious.   All other systems reviewed and are negative.      Objective:    Physical Exam Vitals reviewed.  Constitutional:      General: He is not in acute distress.    Appearance: Normal appearance. He is obese.  HENT:     Head: Normocephalic.  Cardiovascular:     Rate and Rhythm: Normal rate and regular rhythm.     Pulses: Normal pulses.     Heart sounds: Normal heart sounds.     Comments: No obvious peripheral edema Pulmonary:     Effort: Pulmonary effort is normal.     Breath sounds: Normal breath sounds.  Musculoskeletal:        General: No swelling, tenderness, deformity or signs of injury. Normal range of motion.     Right lower leg: No edema.     Left lower leg: No edema.  Skin:    General: Skin is warm and dry.     Capillary Refill: Capillary refill takes less than 2 seconds.  Neurological:     General: No focal deficit present.     Mental Status: He is alert and oriented to person, place, and time.  Psychiatric:        Mood and Affect: Mood normal.        Behavior: Behavior normal.        Thought Content: Thought content normal.  Judgment: Judgment normal.     BP 111/81   Pulse 98   Temp 98.1 F (36.7 C)   Ht 6' 1"  (1.854 m)   Wt 258 lb (117 kg)   SpO2 98%   BMI 34.04 kg/m  Wt Readings from Last 3 Encounters:  01/21/22 258 lb (117 kg)  09/04/21 255 lb 6 oz (115.8 kg)  05/09/21 240 lb 3.2 oz (109 kg)    Immunization History  Administered Date(s) Administered   Pneumococcal Polysaccharide-23 04/05/2017    Diabetic Foot Exam - Simple   No data filed     Lab Results  Component Value Date   TSH 2.570 09/04/2021   Lab Results  Component Value Date   WBC 8.4 09/04/2021   HGB 13.4 09/04/2021   HCT 41.0 09/04/2021   MCV 82 09/04/2021   PLT 220 09/04/2021   Lab Results  Component Value Date   NA 144 09/04/2021   K 4.3 09/04/2021   CO2 27 01/29/2021   GLUCOSE 111 (H) 09/04/2021   BUN 25 09/04/2021   CREATININE 1.94 (H) 09/04/2021   BILITOT 0.5 09/04/2021   ALKPHOS 132 (H) 09/04/2021   AST 13 09/04/2021   ALT 17 12/02/2020   PROT 7.5 09/04/2021   ALBUMIN 4.3 09/04/2021   CALCIUM 9.4 09/04/2021   ANIONGAP 8 01/29/2021   EGFR 37 (L) 09/04/2021   Lab Results  Component Value Date   CHOL 136 09/04/2021   CHOL 68 12/05/2020   CHOL 190 10/10/2019   Lab Results  Component Value Date   HDL 38 (L) 09/04/2021   HDL 23 (L) 12/05/2020   HDL 38 (L) 10/10/2019   Lab Results  Component Value Date   LDLCALC 69 09/04/2021   LDLCALC 35 12/05/2020   LDLCALC 123 (H) 10/10/2019   Lab Results  Component Value Date   TRIG 171 (H) 09/04/2021   TRIG 48 12/05/2020   TRIG 162 (H) 10/10/2019   Lab Results  Component Value Date   CHOLHDL 3.6 09/04/2021   CHOLHDL 3.0 12/05/2020   CHOLHDL 5.0 10/10/2019   Lab Results  Component Value Date   HGBA1C 6.0 (A) 01/21/2022   HGBA1C 6.0 01/21/2022   HGBA1C 6.0 01/21/2022   HGBA1C 6.0 01/21/2022       Assessment & Plan:   Problem List Items Addressed This Visit       Cardiovascular and Mediastinum   CHF (congestive heart failure) ([place]) -  Primary   Relevant Medications   torsemide (DEMADEX) 20 MG tablet, refilled without change  Encouraged continued diet and exercise efforts  Encouraged continued compliance with medication       Respiratory   COPD (chronic obstructive pulmonary disease) (HCC)   Relevant Medications   albuterol (PROVENTIL) (2.5 MG/3ML) 0.083% nebulizer solution   albuterol (VENTOLIN HFA) 108 (90 Base) MCG/ACT inhaler   montelukast (SINGULAIR) 10 MG tablet Medications refilled without change  Encouraged continued diet and exercise efforts  Encouraged continued compliance with medication     Asthma   Relevant Medications   albuterol (PROVENTIL) (2.5 MG/3ML) 0.083% nebulizer solution   albuterol (VENTOLIN HFA) 108 (90 Base) MCG/ACT inhaler   montelukast (SINGULAIR) 10 MG tablet Medications refilled without change Encouraged continued diet and exercise efforts  Encouraged continued compliance with medication     Other Visit Diagnoses     Type 2 diabetes mellitus with other specified complication, with long-term current use of insulin (HCC)       Relevant Medications   metFORMIN (GLUCOPHAGE) 1000 MG  tablet   Other Relevant Orders   HgB A1c (Completed): 6.0, at goal  Medications refilled without change  Encouraged continued diet and exercise efforts  Encouraged continued compliance with medication     Seasonal allergies       Relevant Medications   montelukast (SINGULAIR) 10 MG tablet   Gastroesophageal reflux disease without esophagitis       Relevant Medications   lansoprazole (PREVACID) 30 MG capsule, refilled without change  Discussed non pharmacological methods for management of symptoms Informed to take OTC medications as needed    Acute left-sided low back pain with sciatica, sciatica laterality unspecified       Relevant Medications   cyclobenzaprine (FLEXERIL) 10 MG tablet, refilled at patient request Discussed non pharmacological methods for management of symptoms Informed to take  OTC medications as needed    Follow up in 3 mths for reevaluation of chronic illness, sooner as needed     I have changed Kevin [name]'s albuterol. I am also having him start on cyclobenzaprine. Additionally, I am having him maintain his blood glucose meter kit and supplies, Accu-Chek Softclix Lancets, OVER THE COUNTER MEDICATION, amiodarone, cetirizine, apixaban, atorvastatin, fluticasone, Accu-Chek Guide, sitaGLIPtin, glipiZIDE, potassium chloride SA, albuterol, lansoprazole, metFORMIN, montelukast, and torsemide.  Meds ordered this encounter  Medications   albuterol (PROVENTIL) (2.5 MG/3ML) 0.083% nebulizer solution    Sig: Take 3 mLs (2.5 mg total) by nebulization every 6 (six) hours as needed for wheezing or shortness of breath.    Dispense:  75 mL    Refill:  3   albuterol (VENTOLIN HFA) 108 (90 Base) MCG/ACT inhaler    Sig: Inhale 2 puffs into the lungs every 4 (four) hours as needed for wheezing or shortness of breath.    Dispense:  8.5 g    Refill:  1   lansoprazole (PREVACID) 30 MG capsule    Sig: Take 1 capsule (30 mg total) by mouth daily as needed (for heartburn/indigestion).    Dispense:  90 capsule    Refill:  1   metFORMIN (GLUCOPHAGE) 1000 MG tablet    Sig: Take 1 tablet (1,000 mg total) by mouth 2 (two) times daily with a meal.    Dispense:  180 tablet    Refill:  1   montelukast (SINGULAIR) 10 MG tablet    Sig: Take 1 tablet (10 mg total) by mouth at bedtime.    Dispense:  90 tablet    Refill:  1   torsemide (DEMADEX) 20 MG tablet    Sig: Take 2 tablets (40 mg total) by mouth daily.    Dispense:  60 tablet    Refill:  3   cyclobenzaprine (FLEXERIL) 10 MG tablet    Sig: Take 1 tablet (10 mg total) by mouth 3 (three) times daily as needed for muscle spasms.    Dispense:  30 tablet    Refill:  0     Teena Dunk, NP

## 2022-01-26 ENCOUNTER — Other Ambulatory Visit: Payer: Self-pay | Admitting: Cardiology

## 2022-01-28 ENCOUNTER — Telehealth: Payer: Self-pay | Admitting: Nurse Practitioner

## 2022-01-28 NOTE — Telephone Encounter (Signed)
torsemide (DEMADEX) 20 MG tablet refill request

## 2022-02-02 ENCOUNTER — Other Ambulatory Visit: Payer: Self-pay | Admitting: Nurse Practitioner

## 2022-02-02 DIAGNOSIS — E1169 Type 2 diabetes mellitus with other specified complication: Secondary | ICD-10-CM

## 2022-02-04 ENCOUNTER — Telehealth: Payer: Self-pay

## 2022-02-04 DIAGNOSIS — I509 Heart failure, unspecified: Secondary | ICD-10-CM

## 2022-02-04 MED ORDER — TORSEMIDE 20 MG PO TABS: 40.0000 mg | ORAL_TABLET | Freq: Every day | ORAL | 3 refills | Status: DC

## 2022-02-04 NOTE — Telephone Encounter (Signed)
No additional notes needed  

## 2022-02-13 ENCOUNTER — Other Ambulatory Visit: Payer: Self-pay | Admitting: Nurse Practitioner

## 2022-02-13 DIAGNOSIS — J45909 Unspecified asthma, uncomplicated: Secondary | ICD-10-CM

## 2022-02-13 DIAGNOSIS — J449 Chronic obstructive pulmonary disease, unspecified: Secondary | ICD-10-CM

## 2022-02-13 DIAGNOSIS — K219 Gastro-esophageal reflux disease without esophagitis: Secondary | ICD-10-CM

## 2022-02-13 DIAGNOSIS — J302 Other seasonal allergic rhinitis: Secondary | ICD-10-CM

## 2022-02-13 DIAGNOSIS — E1169 Type 2 diabetes mellitus with other specified complication: Secondary | ICD-10-CM

## 2022-02-13 DIAGNOSIS — Z794 Long term (current) use of insulin: Secondary | ICD-10-CM

## 2022-02-19 ENCOUNTER — Other Ambulatory Visit (HOSPITAL_COMMUNITY): Payer: Self-pay | Admitting: Cardiology

## 2022-02-19 DIAGNOSIS — I509 Heart failure, unspecified: Secondary | ICD-10-CM

## 2022-02-20 ENCOUNTER — Telehealth: Payer: Self-pay

## 2022-02-20 MED ORDER — ACCU-CHEK SOFTCLIX LANCETS MISC: 3 refills | Status: DC

## 2022-02-20 NOTE — Addendum Note (Signed)
Addended by: Verlan Friends on: 02/20/2022 04:54 PM   Modules accepted: Orders

## 2022-02-20 NOTE — Telephone Encounter (Signed)
ACCU check lancets

## 2022-02-20 NOTE — Telephone Encounter (Signed)
Refill sent as requested. Sent to mail order

## 2022-02-23 ENCOUNTER — Other Ambulatory Visit: Payer: Self-pay

## 2022-02-23 MED ORDER — ACCU-CHEK SOFTCLIX LANCETS MISC
3 refills | Status: DC
Start: 2022-02-23 — End: 2022-03-17

## 2022-02-24 ENCOUNTER — Other Ambulatory Visit: Payer: Self-pay | Admitting: Nurse Practitioner

## 2022-02-24 ENCOUNTER — Other Ambulatory Visit: Payer: Self-pay | Admitting: Cardiology

## 2022-02-24 DIAGNOSIS — E1169 Type 2 diabetes mellitus with other specified complication: Secondary | ICD-10-CM

## 2022-03-02 NOTE — Telephone Encounter (Signed)
Lancets (he's completely out) he said he put in a request a week ago!  [facility] [address]

## 2022-03-17 ENCOUNTER — Other Ambulatory Visit: Payer: Self-pay | Admitting: Internal Medicine

## 2022-03-17 ENCOUNTER — Telehealth: Payer: Self-pay

## 2022-03-17 MED ORDER — ACCU-CHEK SOFTCLIX LANCETS MISC: 3 refills | Status: AC

## 2022-03-17 MED ORDER — ACCU-CHEK GUIDE VI STRP: ORAL_STRIP | 3 refills | Status: AC

## 2022-03-17 NOTE — Telephone Encounter (Signed)
Lancets (needles for ACC check)   [facility] [address] [address]

## 2022-03-23 ENCOUNTER — Other Ambulatory Visit: Payer: Self-pay | Admitting: Nurse Practitioner

## 2022-03-23 DIAGNOSIS — E782 Mixed hyperlipidemia: Secondary | ICD-10-CM

## 2022-03-23 DIAGNOSIS — T7840XA Allergy, unspecified, initial encounter: Secondary | ICD-10-CM

## 2022-04-09 ENCOUNTER — Telehealth: Payer: Self-pay

## 2022-04-09 NOTE — Telephone Encounter (Signed)
Flonase

## 2022-04-10 ENCOUNTER — Other Ambulatory Visit: Payer: Self-pay | Admitting: Nurse Practitioner

## 2022-04-10 ENCOUNTER — Telehealth: Payer: Self-pay

## 2022-04-10 DIAGNOSIS — T7840XA Allergy, unspecified, initial encounter: Secondary | ICD-10-CM

## 2022-04-10 MED ORDER — FLUTICASONE PROPIONATE 50 MCG/ACT NA SUSP: NASAL | 11 refills | Status: DC

## 2022-04-10 NOTE — Telephone Encounter (Signed)
Torsemide 

## 2022-04-13 ENCOUNTER — Other Ambulatory Visit: Payer: Self-pay | Admitting: Nurse Practitioner

## 2022-04-13 DIAGNOSIS — I509 Heart failure, unspecified: Secondary | ICD-10-CM

## 2022-04-13 MED ORDER — TORSEMIDE 20 MG PO TABS: 40.0000 mg | ORAL_TABLET | Freq: Every day | ORAL | 1 refills | Status: AC

## 2022-04-22 ENCOUNTER — Other Ambulatory Visit: Payer: Self-pay | Admitting: Nurse Practitioner

## 2022-04-22 DIAGNOSIS — J449 Chronic obstructive pulmonary disease, unspecified: Secondary | ICD-10-CM

## 2022-04-23 ENCOUNTER — Ambulatory Visit: Payer: Medicare Other | Admitting: Nurse Practitioner

## 2022-04-30 ENCOUNTER — Telehealth: Payer: Self-pay | Admitting: *Deleted

## 2022-04-30 NOTE — Patient Outreach (Signed)
  Care Coordination   04/30/2022 Name: Kevin [name] MRN: [id number] DOB: [birth year]   Care Coordination Outreach Attempts:  An unsuccessful telephone outreach was attempted today to offer the patient information about available care coordination services as a benefit of their health plan.   Follow Up Plan:  Additional outreach attempts will be made to offer the patient care coordination information and services.   Encounter Outcome:  No Answer  Care Coordination Interventions Activated:  No   Care Coordination Interventions:  No, not indicated    SIG .ccs

## 2022-05-05 ENCOUNTER — Telehealth: Payer: Self-pay | Admitting: *Deleted

## 2022-05-05 ENCOUNTER — Encounter: Payer: Self-pay | Admitting: *Deleted

## 2022-05-05 NOTE — Patient Outreach (Signed)
  Care Coordination   Initial Visit Note   05/05/2022 Name: Kevin [name] MRN: [id number] DOB: 10-26-1955  Kevin [name] is a 66 y.o. year old male who sees Kevin [name], Kevin Church I, NP for primary care. [name] spoke with  Kevin [name] by phone today.  What matters to the patients health and wellness today?  [name]'m pretty healthy but [name] will work with you.    Goals Addressed               This Visit's Progress     Patient Stated     [name] will report my weight to NP on each call. (pt-stated)        Care Coordination Interventions: Assessed for cardiac conditions. Pt says he has AFIB. Note pt is on potassium chloride and torsemide. Pt states he weighs everyday and his current wt is 255# which is stable.       Patient agrees to report several FBSs and NFBS levels on each call from NP. (pt-stated)        Care Coordination Interventions: Assessed pt chronic disease states. He says he is "prediabetic" and checks his glucose levels occasionally at home. He is on Glipizide 10 mg bid, Januvia 25 mg daily and metormin 1000 mg bid. He says his HgbA1C is under 7.0 (last one in June 2023 was 6.0!) Two years ago it was 14.+ upon his dx of DM type II.        SDOH assessments and interventions completed:  Yes  SDOH Interventions Today    Flowsheet Row Most Recent Value  SDOH Interventions   Food Insecurity Interventions Intervention Not Indicated  Housing Interventions Intervention Not Indicated  Transportation Interventions Intervention Not Indicated        Care Coordination Interventions Activated:  Yes  Care Coordination Interventions:  Yes, provided   Follow up plan: Follow up call scheduled for 1 month.    Encounter Outcome:  Pt. Visit Completed   Kevin Memos C. Myrtie Neither, MSN, [facility] Gerontological Nurse Practitioner [facility] Care Management [phone]

## 2022-05-25 ENCOUNTER — Other Ambulatory Visit: Payer: Self-pay | Admitting: Nurse Practitioner

## 2022-05-25 DIAGNOSIS — Z794 Long term (current) use of insulin: Secondary | ICD-10-CM

## 2022-05-29 ENCOUNTER — Other Ambulatory Visit: Payer: Self-pay | Admitting: Nurse Practitioner

## 2022-05-29 DIAGNOSIS — J302 Other seasonal allergic rhinitis: Secondary | ICD-10-CM

## 2022-05-29 DIAGNOSIS — K219 Gastro-esophageal reflux disease without esophagitis: Secondary | ICD-10-CM

## 2022-05-29 DIAGNOSIS — J45909 Unspecified asthma, uncomplicated: Secondary | ICD-10-CM

## 2022-05-29 DIAGNOSIS — E1169 Type 2 diabetes mellitus with other specified complication: Secondary | ICD-10-CM

## 2022-06-04 ENCOUNTER — Encounter: Payer: Self-pay | Admitting: *Deleted

## 2022-06-04 ENCOUNTER — Telehealth: Payer: Self-pay | Admitting: *Deleted

## 2022-06-04 ENCOUNTER — Ambulatory Visit: Payer: Self-pay | Admitting: *Deleted

## 2022-06-04 NOTE — Patient Outreach (Signed)
  Care Coordination   Follow Up Visit Note   06/04/2022 Name: Kevin [name] MRN: [id number] DOB: 01-15-1956  Kevin [name] is a 66 y.o. year old male who sees [name], [name], NP for primary care. [name] spoke with  Kevin [name] by phone today.  What matters to the patients health and wellness today?  Following current plan of care.    Goals Addressed               This Visit's Progress     Patient Stated     [name] will report my weight to NP on each call. (pt-stated)        Care Coordination Interventions: Basic overview and discussion of pathophysiology of Heart Failure reviewed Provided education on low sodium diet Advised patient to weigh each morning after emptying bladder Discussed importance of daily weight and advised patient to weigh and record daily Today's weight was 253#, he lost 5# and he reports he had a cold and actually got down to 247# but now his appetite has improved. Reinforced no added salt, reading labels.       Patient agrees to report several FBSs and NFBS levels on each call from NP. (pt-stated)   On track     Care Coordination Interventions: Discussed plans with patient for ongoing care management follow up and provided patient with direct contact information for care management team Advised patient, providing education and rationale, to check cbg several times a week and record, calling provider for findings outside established parameters Also discussed his BMI indicating obesity. Suggested a long term goal to get down to 225# but pt is not interesting in making that commitment. Reviewed pt breakfast intake today which equalled 60 Gms of carbs. We also discussed carb counting and trying to limit himself to 45 Gms of carbs per meal which may help. He declined further education on the topic. Glucose ranged from 95-148.         SDOH assessments and interventions completed:  Yes Previously assessed.   Care Coordination Interventions Activated:  Yes  Care  Coordination Interventions:  Yes, provided   Follow up plan: Follow up call scheduled for 1 month.    Encounter Outcome:  Pt. Visit Completed   Noralyn Pick C. Burgess Estelle, MSN, [facility] Gerontological Nurse Practitioner [facility] Care Management [phone]

## 2022-06-17 ENCOUNTER — Other Ambulatory Visit: Payer: Self-pay

## 2022-06-17 DIAGNOSIS — E1169 Type 2 diabetes mellitus with other specified complication: Secondary | ICD-10-CM

## 2022-06-17 MED ORDER — SITAGLIPTIN PHOSPHATE 25 MG PO TABS: ORAL_TABLET | ORAL | 0 refills | Status: DC

## 2022-06-17 MED ORDER — GLIPIZIDE 10 MG PO TABS: ORAL_TABLET | ORAL | 0 refills | Status: DC

## 2022-06-30 DIAGNOSIS — E119 Type 2 diabetes mellitus without complications: Secondary | ICD-10-CM | POA: Diagnosis not present

## 2022-06-30 LAB — HM DIABETES EYE EXAM

## 2022-07-03 ENCOUNTER — Encounter: Payer: Self-pay | Admitting: *Deleted

## 2022-07-03 ENCOUNTER — Telehealth: Payer: Self-pay | Admitting: *Deleted

## 2022-07-03 NOTE — Patient Outreach (Signed)
  Care Coordination   Follow Up Visit Note   07/03/2022 Name: Kevin [name] MRN: [id number] DOB: 04/10/1956  Kevin [name] is a 66 y.o. year old male who sees Ivonne Andrew, NP for primary care. I spoke with  Kevin [name] by phone today.  What matters to the patients health and wellness today?  KEEP DOING WHAT I AM DOING. NOT INTERESTED IN MAKING BIG COMMITMENTS FOR CHANGE.    Goals Addressed               This Visit's Progress     Patient Stated     I will report my weight to NP on each call. (pt-stated)   On track     Care Coordination Interventions: Provided education on low sodium diet Discussed importance of daily weight and advised patient to weigh and record daily Provided patient with education about the role of exercise in the management of heart failure  TODAY'S WT IS 255# UP FROM 253# LAST MONTH. ADVISED HOLIDAY EATING CAN CAUSE HF ISSUES AS FAMILY AND FRIENDS MAY BE ADDING SALT TO THEIR RECIPES. WEIGHING DAILY AND REPORTING WT GAIN TO AVOID HOLIDAY HOSPITALIZATIONS IS PARAMOUNT! CALL PROVIDER FOR 3-5# WT GAIN FOR INSTRUCTIONS. KEEP READING LABELS AND CHOOSE MINIMAL NA CONTENTS. PAY ATTENTION TO SERVING SIZES ALSO.       Patient agrees to report several FBSs and NFBS levels on each call from NP. (pt-stated)   On track     Care Coordination Interventions: Discussed plans with patient for ongoing care management follow up and provided patient with direct contact information for care management team Advised patient, providing education and rationale, to check cbg fasting daily  and record, calling provider for findings outside established parameters  PATIENT REPORTS FBS LEVELS RANGE BETWEEN 110-130 AND NFBS "ABOUT THE SAME" DENIES LEVELS >160.  ENCOURAGED HOLIDAY MEALS AND TREATS SELF CONTROL BY LIMITING SERVING SIZES AND SAMPLING MINIMAL SWEET TREATS.  REMINDED ELEVATED GLUCOSE CAN CONTRIBUTE TO COMPLICATIONS OF VISION, KIDNEY AND CIRCULATORY PROBLEMS.        SDOH  assessments and interventions completed:  Yes   PREVIOUSLY ASSESSED.  Care Coordination Interventions:  Yes, provided   Follow up plan: Follow up call scheduled for 1 MONTH.    Encounter Outcome:  Pt. Visit Completed   Noralyn Pick C. Burgess Estelle, MSN, [facility] Gerontological Nurse Practitioner [facility] Care Management [phone]

## 2022-07-29 IMAGING — DX DG CHEST 1V PORT
1 series · 1 of 1 positions shown · non-contrast
Comparison: Radiographs 12/05/2020 and 12/04/2020.  CT 07/23/2015.

CLINICAL DATA: Central line placement.

EXAM:
PORTABLE CHEST 1 VIEW

[chest ap]
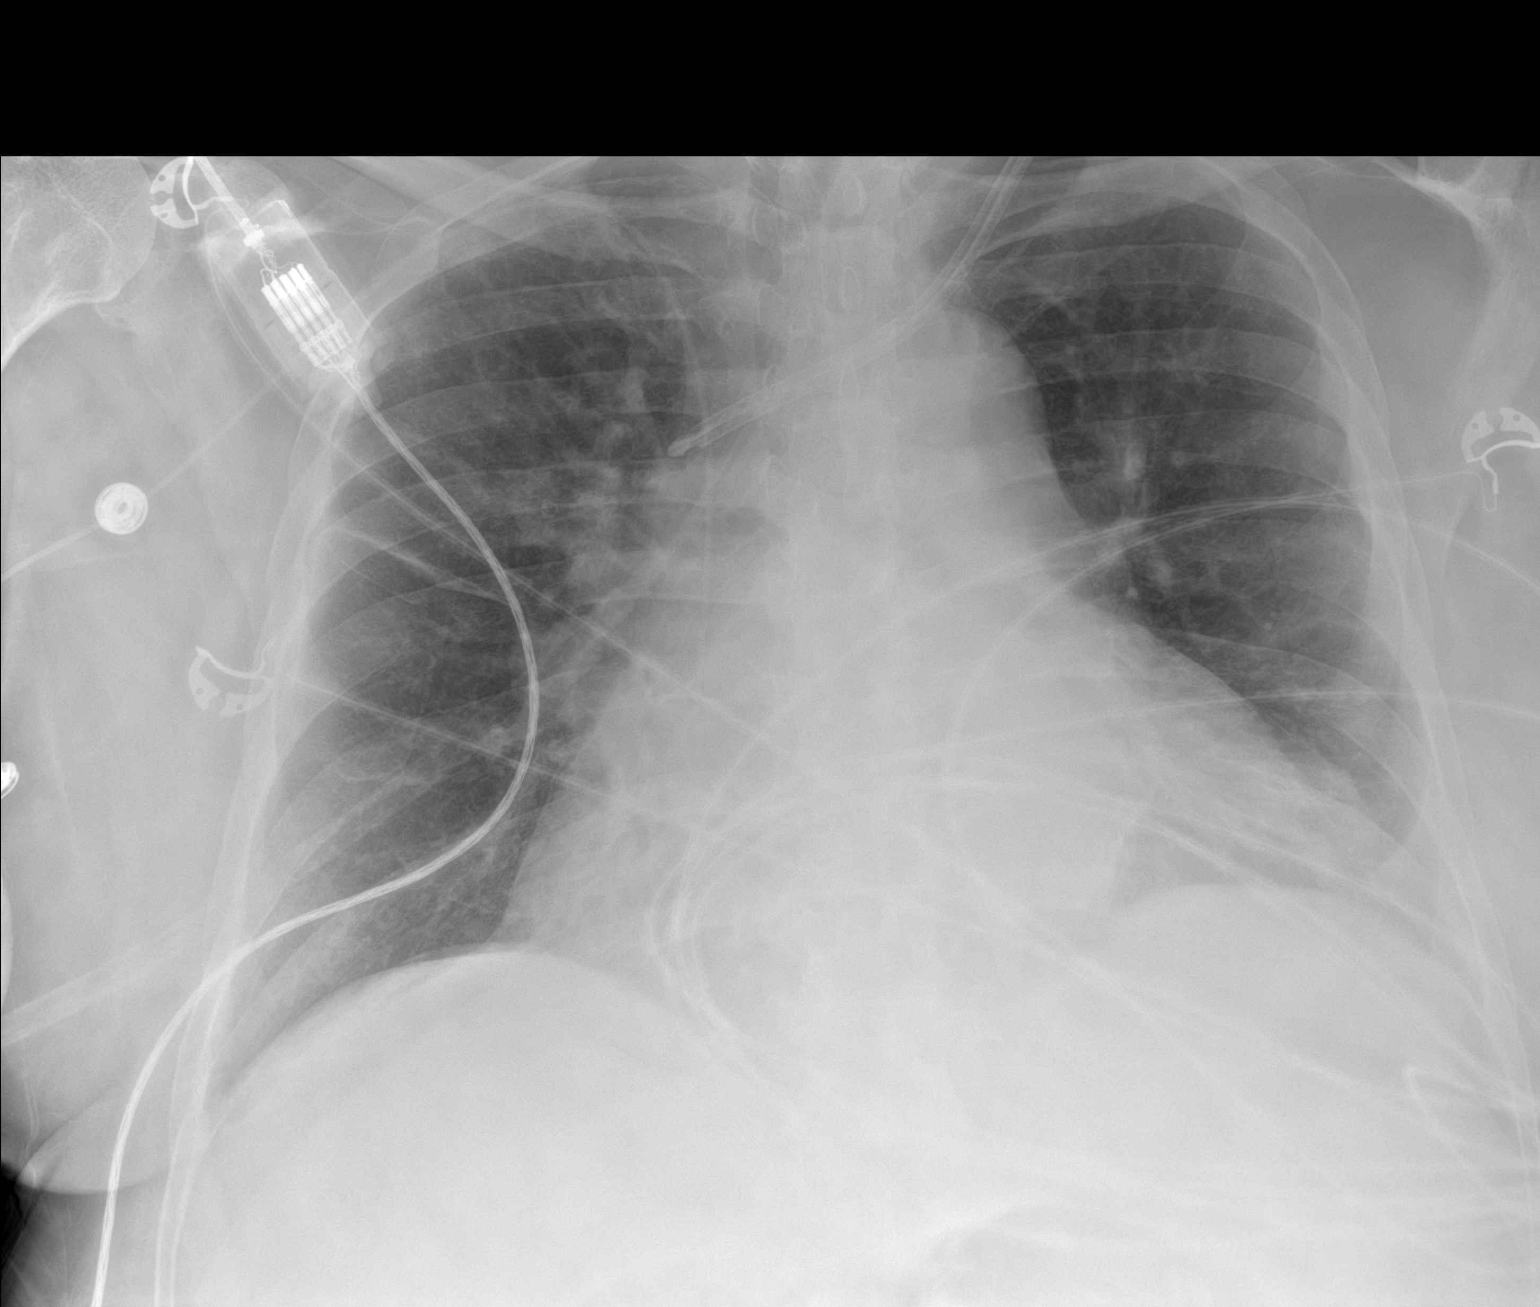

[1 of 1 positions shown; findings below may reference images not displayed]

FINDINGS: Left IJ hemodialysis catheter projects to the confluence of the
brachiocephalic veins, similar to previous study. Right arm PICC
projects to the level of the superior cavoatrial junction. No
pneumothorax. Stable cardiac enlargement and bibasilar atelectasis.
The right pleural effusion has resolved with improving aeration of
the right lung base. A small left pleural effusion does not appear
significantly changed.
IMPRESSION: 1. Similar position of the left IJ hemodialysis and right arm PICC
lines compared with previous study from 6 days prior.
2. No pneumothorax.
3. Improved right pleural effusion.

## 2022-08-03 ENCOUNTER — Telehealth: Payer: Self-pay | Admitting: *Deleted

## 2022-08-03 ENCOUNTER — Encounter: Payer: Self-pay | Admitting: *Deleted

## 2022-08-03 NOTE — Patient Outreach (Signed)
  Care Coordination   09/14/2022 Name: Kevin [name] MRN: [id number] DOB: Apr 30, 1956   Care Coordination Outreach Attempts:  An unsuccessful telephone outreach was attempted today to offer the patient information about available care coordination services as a benefit of their health plan.   Follow Up Plan:  Additional outreach attempts will be made to offer the patient care coordination information and services.   Encounter Outcome:  No Answer   Care Coordination Interventions:  No, not indicated    SIG Emile Ringgenberg C. Myrtie Neither, MSN, [facility] Gerontological Nurse Practitioner [facility] Care Management [phone]   Eulah Pont. Myrtie Neither, MSN, [facility] Gerontological Nurse Practitioner [facility] Care Management [phone]

## 2022-08-06 ENCOUNTER — Ambulatory Visit (INDEPENDENT_AMBULATORY_CARE_PROVIDER_SITE_OTHER): Payer: Medicare Other

## 2022-08-06 VITALS — Ht 73.0 in | Wt 250.0 lb

## 2022-08-06 DIAGNOSIS — Z1211 Encounter for screening for malignant neoplasm of colon: Secondary | ICD-10-CM | POA: Diagnosis not present

## 2022-08-06 DIAGNOSIS — Z0000 Encounter for general adult medical examination without abnormal findings: Secondary | ICD-10-CM

## 2022-08-06 NOTE — Progress Notes (Signed)
Subjective:   Kevin [name] is a 67 y.o. male who presents for Medicare Annual/Subsequent preventive examination. I connected with  Kevin [name] on 08/06/22 by a audio enabled telemedicine application and verified that I am speaking with the correct person using two identifiers.  Patient Location: Home  Provider Location: Home Office  I discussed the limitations of evaluation and management by telemedicine. The patient expressed understanding and agreed to proceed.  Review of Systems     Cardiac Risk Factors include: advanced age (>38men, >73 women);diabetes mellitus;hypertension;male gender     Objective:    Today's Vitals   08/06/22 1442  Weight: 250 lb (113.4 kg)  Height: 6\' 1"  (1.854 m)   Body mass index is 32.98 kg/m.     08/06/2022    2:46 PM 12/21/2020    1:38 PM 12/09/2020    2:47 PM 12/02/2020    6:56 PM 12/02/2020    8:59 AM 04/09/2017   10:38 AM 04/04/2017    3:00 PM  Advanced Directives  Does Patient Have a Medical Advance Directive? No No No  No No No  Would patient like information on creating a medical advance directive? No - Patient declined No - Patient declined No - Patient declined No - Patient declined   No - Patient declined    Current Medications (verified) Outpatient Encounter Medications as of 08/06/2022  Medication Sig   Accu-Chek Softclix Lancets lancets USE UP TO 4 TIMES A DAY AS DIRECTED   albuterol (PROVENTIL) (2.5 MG/3ML) 0.083% nebulizer solution Take 3 mLs (2.5 mg total) by nebulization every 6 (six) hours as needed for wheezing or shortness of breath.   albuterol (VENTOLIN HFA) 108 (90 Base) MCG/ACT inhaler INHALE TWO PUFFS INTO LUNGS EVERY 4 HOURS AS NEEDED FOR SHORTNESS OF BREATH OR WHEEZING (BULK)   atorvastatin (LIPITOR) 10 MG tablet TAKE ONE TABLET BY MOUTH DAILY AT 5PM   blood glucose meter kit and supplies Dispense based on patient and insurance preference. Use up to four times daily as directed. (FOR ICD-10 E10.9, E11.9).   cetirizine  (ZYRTEC) 10 MG tablet Take 1 tablet (10 mg total) by mouth daily.   cyclobenzaprine (FLEXERIL) 10 MG tablet Take 1 tablet (10 mg total) by mouth 3 (three) times daily as needed for muscle spasms.   ELIQUIS 5 MG TABS tablet TAKE ONE TABLET BY MOUTH TWICE DAILY @ 9AM & 9PM   fluticasone (FLONASE) 50 MCG/ACT nasal spray INSTILL 2 SPRAYS IN EACH NOSTRIL EVERY DAY (BULK)   glipiZIDE (GLUCOTROL) 10 MG tablet TAKE ONE TABLET BY MOUTH TWICE DAILY @ 9AM & 5PM BEFORE A MEAL   glucose blood (ACCU-CHEK GUIDE) test strip Use as instructed   lansoprazole (PREVACID) 30 MG capsule TAKE ONE CAPSULE BY MOUTH DAILY AS NEEDED FOR HEARTBURN AND INDIGESTION (VIAL)   metFORMIN (GLUCOPHAGE) 1000 MG tablet TAKE ONE TABLET BY MOUTH TWICE DAILY @ 9AM & 5PM WITH FOOD   montelukast (SINGULAIR) 10 MG tablet TAKE ONE TABLET BY MOUTH DAILY AT 9PM AT BEDTIME   OVER THE COUNTER MEDICATION Place 1-2 drops into both eyes See admin instructions. Rohto Maximum Redness Relief Cooling Eye Drops- Place 1-2 drops into both eyes up to 4 times a day as needed for irritation   potassium chloride SA (KLOR-CON M) 20 MEQ tablet TAKE ONE TABLET BY MOUTH DAILY AT 9AM NEEDS APPOINTMENT FOR FURTHER REFILLS   sitaGLIPtin (JANUVIA) 25 MG tablet TAKE ONE TABLET BY MOUTH DAILY AT 9AM   torsemide (DEMADEX) 20 MG tablet Take 2  tablets (40 mg total) by mouth daily.   No facility-administered encounter medications on file as of 08/06/2022.    Allergies (verified) Patient has no known allergies.   History: Past Medical History:  Diagnosis Date   Asthma    Chronic systolic CHF (congestive heart failure) (HCC)    a. 06/2015 Echo: EF 25-30%, mod-sev MR, PASP . b. 08/2016: echo showing EF of 20-25% with diffuse HK   Cocaine use    Essential hypertension    Hyperglycemia 09/2019   Hyperlipidemia 09/2019   Longstanding persistent atrial fibrillation (HCC)    a. Initially dx 06/2015, paroxysmal at that time. b. Recurrent in Feb 2018 and beyond.    Moderate mitral regurgitation    a. 06/2015 Echo: Mod-Sev MR. b. 08/2016: echo showing moderate MR.    NICM (nonischemic cardiomyopathy) (HCC)    a. 06/2015 Echo: EF 25-30%, normal cors by cath - ? Tachy-mediated;     Non-obstructive CAD    a. 06/2015 Cath: LM nl, LAD 43m, LCX nl, RCA nl, EF 25-30%.   Tobacco use    Vitamin D deficiency    Past Surgical History:  Procedure Laterality Date   CARDIAC CATHETERIZATION N/A 07/26/2015   Procedure: Left Heart Cath and Coronary Angiography;  Surgeon: Corky Crafts, MD;  Location: [facility] INVASIVE CV LAB;  Service: Cardiovascular;  Laterality: N/A;   CARDIOVERSION N/A 04/09/2017   Procedure: CARDIOVERSION;  Surgeon: Pricilla Riffle, MD;  Location: [facility] ENDOSCOPY;  Service: Cardiovascular;  Laterality: N/A;   CARDIOVERSION N/A 12/09/2020   Procedure: CARDIOVERSION;  Surgeon: Laurey Morale, MD;  Location: [facility] ENDOSCOPY;  Service: Cardiovascular;  Laterality: N/A;   RIGHT HEART CATH N/A 12/16/2020   Procedure: RIGHT HEART CATH;  Surgeon: Laurey Morale, MD;  Location: [facility] INVASIVE CV LAB;  Service: Cardiovascular;  Laterality: N/A;   TEE WITHOUT CARDIOVERSION N/A 04/09/2017   Procedure: TRANSESOPHAGEAL ECHOCARDIOGRAM (TEE);  Surgeon: Pricilla Riffle, MD;  Location: [facility] ENDOSCOPY;  Service: Cardiovascular;  Laterality: N/A;   TEE WITHOUT CARDIOVERSION N/A 12/09/2020   Procedure: TRANSESOPHAGEAL ECHOCARDIOGRAM (TEE);  Surgeon: Laurey Morale, MD;  Location: [facility] ENDOSCOPY;  Service: Cardiovascular;  Laterality: N/A;   Family History  Problem Relation Age of Onset   Hypertension Father    Social History   Socioeconomic History   Marital status: Single    Spouse name: Not on file   Number of children: Not on file   Years of education: Not on file   Highest education level: Not on file  Occupational History    Employer: [employer]  Tobacco Use   Smoking status: Former    Packs/day: 0.50    Years: 40.00    Total pack years: 20.00    Types:  Cigarettes   Smokeless tobacco: Never   Tobacco comments:    Quit in 2014  Vaping Use   Vaping Use: Never used  Substance and Sexual Activity   Alcohol use: Yes    Alcohol/week: 0.0 standard drinks of alcohol    Comment: 2-3 drinks per week.   Drug use: Yes    Comment: Not currently. Quit in 2014. 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   Sexual activity: Not Currently  Other Topics Concern   Not on file  Social History Narrative   Not on file   Social Determinants of Health   Financial Resource Strain: Low Risk  (08/06/2022)   Overall Financial Resource Strain (CARDIA)    Difficulty of Paying Living Expenses: Not hard at all  Food  Insecurity: No Food Insecurity (08/06/2022)   Hunger Vital Sign    Worried About Running Out of Food in the Last Year: Never true    Ran Out of Food in the Last Year: Never true  Transportation Needs: No Transportation Needs (08/06/2022)   PRAPARE - Administrator, Civil Service (Medical): No    Lack of Transportation (Non-Medical): No  Physical Activity: Insufficiently Active (08/06/2022)   Exercise Vital Sign    Days of Exercise per Week: 3 days    Minutes of Exercise per Session: 30 min  Stress: No Stress Concern Present (08/06/2022)   [employer] of Occupational Health - Occupational Stress Questionnaire    Feeling of Stress : Not at all  Social Connections: Moderately Integrated (08/06/2022)   Social Connection and Isolation Panel [NHANES]    Frequency of Communication with Friends and Family: More than three times a week    Frequency of Social Gatherings with Friends and Family: More than three times a week    Attends Religious Services: More than 4 times per year    Active Member of [employer] or Organizations: Yes    Attends Engineer, structural: More than 4 times per year    Marital Status: Never married    Tobacco Counseling Counseling given: Not Answered Tobacco comments: Quit in 2014   Clinical Intake:  Pre-visit  preparation completed: Yes  Pain : No/denies pain     Nutritional Risks: None Diabetes: Yes CBG done?: No Did pt. bring in CBG monitor from home?: No  How often do you need to have someone help you when you read instructions, pamphlets, or other written materials from your doctor or pharmacy?: 1 - Never  Diabetic?yes  Nutrition Risk Assessment:  Has the patient had any N/V/D within the last 2 months?  No  Does the patient have any non-healing wounds?  No  Has the patient had any unintentional weight loss or weight gain?  No   Diabetes:  Is the patient diabetic?  Yes  If diabetic, was a CBG obtained today?  No  Did the patient bring in their glucometer from home?  No  How often do you monitor your CBG's? 2 x day .   Financial Strains and Diabetes Management:  Are you having any financial strains with the device, your supplies or your medication? No .  Does the patient want to be seen by Chronic Care Management for management of their diabetes?  No  Would the patient like to be referred to a Nutritionist or for Diabetic Management?  No   Diabetic Exams:  Diabetic Eye Exam: Completed 06/2022 Diabetic Foot Exam: Overdue, Pt has been advised about the importance in completing this exam. Pt is scheduled for diabetic foot exam on next office visit .   Interpreter Needed?: No  Information entered by :: Renie Ora, LPN   Activities of Daily Living    08/06/2022    2:46 PM  In your present state of health, do you have any difficulty performing the following activities:  Hearing? 0  Vision? 0  Difficulty concentrating or making decisions? 0  Walking or climbing stairs? 0  Dressing or bathing? 0  Doing errands, shopping? 0  Preparing Food and eating ? N  Using the Toilet? N  In the past six months, have you accidently leaked urine? N  Do you have problems with loss of bowel control? N  Managing your Medications? N  Managing your Finances? N  Housekeeping or managing  your Housekeeping? N    Patient Care Team: Fenton Foy, NP as PCP - General (Pulmonary Disease) Debara Pickett Nadean Corwin, MD as PCP - Cardiology (Cardiology) Constance Haw, MD as Consulting Physician (Cardiology)  Indicate any recent Medical Services you may have received from other than [facility] providers in the past year (date may be approximate).     Assessment:   This is a routine wellness examination for Martin.  Hearing/Vision screen Vision Screening - Comments:: Wears rx glasses - up to date with routine eye exams with  [facility]   Dietary issues and exercise activities discussed: Current Exercise Habits: Home exercise routine, Type of exercise: walking, Time (Minutes): 30, Frequency (Times/Week): 3, Weekly Exercise (Minutes/Week): 90, Intensity: Mild, Exercise limited by: None identified   Goals Addressed             This Visit's Progress    Exercise 3x per week (30 min per time)   On track      Depression Screen    08/06/2022    2:45 PM 09/04/2021   10:28 AM 05/09/2021   10:42 AM 04/19/2020    3:10 PM 10/10/2019    1:32 PM 06/07/2019   11:36 AM 03/31/2019   10:24 AM  PHQ 2/9 Scores  PHQ - 2 Score 0 0 0 0 0 0 0  Exception Documentation    Medical reason       Fall Risk    08/06/2022    2:44 PM 09/04/2021   10:28 AM 05/09/2021   10:42 AM 04/19/2020    3:09 PM 10/10/2019    1:32 PM  Fall Risk   Falls in the past year? 0 0 0 0 0  Number falls in past yr: 0  0 0 0  Injury with Fall? 0  0 0 0  Risk for fall due to : No Fall Risks   No Fall Risks   Follow up Falls prevention discussed        FALL RISK PREVENTION PERTAINING TO THE HOME:  Any stairs in or around the home? Yes  If so, are there any without handrails? No  Home free of loose throw rugs in walkways, pet beds, electrical cords, etc? Yes  Adequate lighting in your home to reduce risk of falls? Yes   ASSISTIVE DEVICES UTILIZED TO PREVENT FALLS:  Life alert? No  Use of a cane, walker or w/c? No   Grab bars in the bathroom? No  Shower chair or bench in shower? No  Elevated toilet seat or a handicapped toilet? No       06/07/2019   11:43 AM  MMSE - Mini Mental State Exam  Orientation to time 5  Orientation to Place 5  Registration 3  Attention/ Calculation 5  Recall 3  Language- name 2 objects 2  Language- repeat 1  Language- follow 3 step command 3  Language- read & follow direction 1  Write a sentence 1  Copy design 1  Total score 30        08/06/2022    2:46 PM  6CIT Screen  What Year? 0 points  What month? 0 points  What time? 0 points  Count back from 20 0 points  Months in reverse 0 points  Repeat phrase 0 points  Total Score 0 points    Immunizations Immunization History  Administered Date(s) Administered   Pneumococcal Polysaccharide-23 04/05/2017    TDAP status: Due, Education has been provided regarding the importance of this vaccine. Advised may  receive this vaccine at local pharmacy or Health Dept. Aware to provide a copy of the vaccination record if obtained from local pharmacy or Health Dept. Verbalized acceptance and understanding.  Flu Vaccine status: Due, Education has been provided regarding the importance of this vaccine. Advised may receive this vaccine at local pharmacy or Health Dept. Aware to provide a copy of the vaccination record if obtained from local pharmacy or Health Dept. Verbalized acceptance and understanding.  Pneumococcal vaccine status: Up to date  Covid-19 vaccine status: Completed vaccines  Qualifies for Shingles Vaccine? Yes   Zostavax completed No   Shingrix Completed?: No.    Education has been provided regarding the importance of this vaccine. Patient has been advised to call insurance company to determine out of pocket expense if they have not yet received this vaccine. Advised may also receive vaccine at local pharmacy or Health Dept. Verbalized acceptance and understanding.  Screening Tests Health Maintenance   Topic Date Due   COVID-19 Vaccine (1) Never done   Diabetic kidney evaluation - Urine ACR  Never done   DTaP/Tdap/Td (1 - Tdap) Never done   COLONOSCOPY (Pts 45-71yrs Insurance coverage will need to be confirmed)  Never done   Zoster Vaccines- Shingrix (1 of 2) Never done   Lung Cancer Screening  07/22/2016   Diabetic kidney evaluation - eGFR measurement  09/04/2022   Pneumonia Vaccine 51+ Years old (2 - PCV) 09/04/2022 (Originally 02/12/2019)   INFLUENZA VACCINE  10/25/2022 (Originally 02/24/2022)   Medicare Annual Wellness (AWV)  08/07/2023   Hepatitis C Screening  Completed   HPV VACCINES  Aged Out    Health Maintenance  Health Maintenance Due  Topic Date Due   COVID-19 Vaccine (1) Never done   Diabetic kidney evaluation - Urine ACR  Never done   DTaP/Tdap/Td (1 - Tdap) Never done   COLONOSCOPY (Pts 45-55yrs Insurance coverage will need to be confirmed)  Never done   Zoster Vaccines- Shingrix (1 of 2) Never done   Lung Cancer Screening  07/22/2016   Diabetic kidney evaluation - eGFR measurement  09/04/2022    Colorectal cancer screening: Type of screening: Cologuard. Completed 04/30/2021. Repeat every 3 years  Lung Cancer Screening: (Low Dose CT Chest recommended if Age 27-80 years, 30 pack-year currently smoking OR have quit w/in 15years.) does not qualify.   Lung Cancer Screening Referral: n/a  Additional Screening:  Hepatitis C Screening: does not qualify; Completed 05/09/2021  Vision Screening: Recommended annual ophthalmology exams for early detection of glaucoma and other disorders of the eye. Is the patient up to date with their annual eye exam?  Yes  Who is the provider or what is the name of the office in which the patient attends annual eye exams? [facility]  If pt is not established with a provider, would they like to be referred to a provider to establish care? No .   Dental Screening: Recommended annual dental exams for proper oral hygiene  Community  Resource Referral / Chronic Care Management: CRR required this visit?  No   CCM required this visit?  No      Plan:     I have personally reviewed and noted the following in the patient's chart:   Medical and social history Use of alcohol, tobacco or illicit drugs  Current medications and supplements including opioid prescriptions. Patient is not currently taking opioid prescriptions. Functional ability and status Nutritional status Physical activity Advanced directives List of other physicians Hospitalizations, surgeries, and ER visits in  previous 12 months Vitals Screenings to include cognitive, depression, and falls Referrals and appointments  In addition, I have reviewed and discussed with patient certain preventive protocols, quality metrics, and best practice recommendations. A written personalized care plan for preventive services as well as general preventive health recommendations were provided to patient.     Lorrene Reid, LPN   11/05/8784   Nurse Notes: Due TDAP /Flu vaccine

## 2022-08-21 ENCOUNTER — Other Ambulatory Visit: Payer: Self-pay | Admitting: Nurse Practitioner

## 2022-08-21 DIAGNOSIS — Z794 Long term (current) use of insulin: Secondary | ICD-10-CM

## 2022-08-31 NOTE — Patient Outreach (Signed)
  Care Coordination   08/31/2022 Name: Kevin [name] MRN: [id number] DOB: 04-Jun-1956   Care Coordination Outreach Attempts:  An unsuccessful telephone outreach was attempted today to offer the patient information about available care coordination services as a benefit of their health plan.   Follow Up Plan:  Additional outreach attempts will be made to offer the patient care coordination information and services.   Encounter Outcome:  No Answer   Care Coordination Interventions:  No, not indicated    SIG Darthula Desa C. Myrtie Neither, MSN, [facility] Gerontological Nurse Practitioner [facility] Care Management [phone]

## 2022-09-01 ENCOUNTER — Encounter: Payer: Self-pay | Admitting: *Deleted

## 2022-09-01 ENCOUNTER — Telehealth: Payer: Self-pay | Admitting: *Deleted

## 2022-09-01 NOTE — Patient Outreach (Addendum)
  Care Coordination   09/01/2022 Name: Kevin [name] MRN: [id number] DOB: 1956-05-30   Care Coordination Outreach Attempts:  A second unsuccessful outreach was attempted today to offer the patient with information about available care coordination services as a benefit of their health plan.     Follow Up Plan:  Additional outreach attempts will be made to offer the patient care coordination information and services.   Encounter Outcome:  No Answer   Care Coordination Interventions:  No, not indicated    Gwyn Mehring C. Myrtie Neither, MSN, [facility] Gerontological Nurse Practitioner [facility] Care Management [phone]

## 2022-09-11 ENCOUNTER — Encounter: Payer: Self-pay | Admitting: *Deleted

## 2022-09-11 ENCOUNTER — Telehealth: Payer: Self-pay | Admitting: *Deleted

## 2022-09-11 NOTE — Patient Outreach (Signed)
  Care Coordination   Follow Up Visit Note   09/11/2022 Name: Kevin [name] MRN: [id number] DOB: 04-27-1956  Kevin [name] is a 67 y.o. year old male who sees Fenton Foy, NP for primary care. I spoke with  Kevin [name] by phone today.  What matters to the patients health and wellness today?  Trying to do some positive things for myself.    Goals Addressed               This Visit's Progress     Patient Stated     I will report my weight to NP on each call. (pt-stated)   On track     See interventions in above glucose monitoring goal      Patient agrees to report several FBSs and NFBS levels on each call from NP. (pt-stated)   On track     Interventions Today    Flowsheet Row Most Recent Value  Chronic Disease   Chronic disease during today's visit Diabetes  General Interventions   General Interventions Discussed/Reviewed General Interventions Discussed  [FBS RANGE 110-140. NO HYPOGLYCEMIA. DOESN'T CHECK DAILY.]  Labs Hgb A1c every 6 months  [LAST A1C WAS 6.0!!!!]  Exercise Interventions   Exercise Discussed/Reviewed Exercise Reviewed  [NOT MEETING GOAL. ONLY WALKS WHEN GOOD WEATHER AND ONLY IF HE GETS A NOTION TO DO IT.]  Education Interventions   Education Provided Provided Education  Provided Verbal Education On Blood Sugar Monitoring, Exercise  Nutrition Interventions   Nutrition Discussed/Reviewed Carbohydrate meal planning, Portion sizes, Decreasing fats  Advanced Directive Interventions   Advanced Directives Discussed/Reviewed Provided resource for acquiring and filling out documents              Other     Exercise 3x per week (30 min per time)   Not on track     See nonprogress on this goal in glucose monitoring goal above.       FBS AND NFBS RANGE IS 110-140.   SDOH assessments and interventions completed:  Yes   PREVIOUSLY ADDRESSED.  Care Coordination Interventions:  Yes, provided   Follow up plan: Follow up call scheduled for 3 MONTHS.     Encounter Outcome:  Pt. Visit Completed   Kayleen Memos C. Myrtie Neither, MSN, [facility] Gerontological Nurse Practitioner [facility] Care Management [phone]

## 2022-09-15 ENCOUNTER — Other Ambulatory Visit: Payer: Self-pay | Admitting: Nurse Practitioner

## 2022-09-15 DIAGNOSIS — Z794 Long term (current) use of insulin: Secondary | ICD-10-CM

## 2022-09-22 ENCOUNTER — Other Ambulatory Visit: Payer: Self-pay | Admitting: Nurse Practitioner

## 2022-09-22 DIAGNOSIS — Z794 Long term (current) use of insulin: Secondary | ICD-10-CM

## 2022-10-12 ENCOUNTER — Other Ambulatory Visit: Payer: Self-pay | Admitting: Nurse Practitioner

## 2022-10-12 DIAGNOSIS — E1169 Type 2 diabetes mellitus with other specified complication: Secondary | ICD-10-CM

## 2022-10-21 ENCOUNTER — Other Ambulatory Visit: Payer: Self-pay | Admitting: Nurse Practitioner

## 2022-10-21 DIAGNOSIS — E1169 Type 2 diabetes mellitus with other specified complication: Secondary | ICD-10-CM

## 2022-10-23 ENCOUNTER — Other Ambulatory Visit: Payer: Self-pay

## 2022-10-23 DIAGNOSIS — E1169 Type 2 diabetes mellitus with other specified complication: Secondary | ICD-10-CM

## 2022-10-23 DIAGNOSIS — J45909 Unspecified asthma, uncomplicated: Secondary | ICD-10-CM

## 2022-10-23 DIAGNOSIS — J302 Other seasonal allergic rhinitis: Secondary | ICD-10-CM

## 2022-10-23 DIAGNOSIS — K219 Gastro-esophageal reflux disease without esophagitis: Secondary | ICD-10-CM

## 2022-10-23 MED ORDER — MONTELUKAST SODIUM 10 MG PO TABS: ORAL_TABLET | ORAL | 0 refills | Status: AC

## 2022-10-23 MED ORDER — METFORMIN HCL 1000 MG PO TABS: ORAL_TABLET | ORAL | 0 refills | Status: AC

## 2022-10-23 MED ORDER — LANSOPRAZOLE 30 MG PO CPDR: DELAYED_RELEASE_CAPSULE | ORAL | 0 refills | Status: AC

## 2022-11-19 ENCOUNTER — Other Ambulatory Visit: Payer: Self-pay | Admitting: Nurse Practitioner

## 2022-11-19 DIAGNOSIS — Z794 Long term (current) use of insulin: Secondary | ICD-10-CM

## 2022-12-04 ENCOUNTER — Ambulatory Visit: Payer: Self-pay | Admitting: *Deleted

## 2022-12-04 NOTE — Patient Outreach (Signed)
  Care Coordination   12/04/2022 Name: Lamoyne Delin MRN: [id number] DOB: Dec 15, 1955   Care Coordination Outreach Attempts:  An unsuccessful telephone outreach was attempted today to offer the patient information about available care coordination services.  Follow Up Plan:  Additional outreach attempts will be made to offer the patient care coordination information and services.   Encounter Outcome:  No Answer   Care Coordination Interventions:  No, not indicated    Jaia Alonge L. Noelle Penner, RN, BSN, CCM [facility] Care Management Community Coordinator Office number [phone]

## 2022-12-11 ENCOUNTER — Other Ambulatory Visit: Payer: Self-pay | Admitting: Nurse Practitioner

## 2022-12-11 ENCOUNTER — Encounter: Payer: 59 | Admitting: *Deleted

## 2022-12-11 DIAGNOSIS — Z794 Long term (current) use of insulin: Secondary | ICD-10-CM

## 2022-12-15 ENCOUNTER — Telehealth: Payer: Self-pay | Admitting: *Deleted

## 2022-12-15 NOTE — Progress Notes (Signed)
  Care Coordination Note  12/15/2022 Name: [name] MRN: [id number] DOB: [birth year]  Kevin [name] is a 67 y.o. year old male who is a primary care patient of Ivonne Andrew, NP and is actively engaged with the care management team. I reached out to [name] by phone today to assist with re-scheduling a follow up visit with the RN Case Manager  Follow up plan: Unsuccessful telephone outreach attempt made. A HIPAA compliant phone message was left for the patient providing contact information and requesting a return call.   [facility]  Care Coordination Care Guide  Direct Dial: [phone]

## 2022-12-21 ENCOUNTER — Other Ambulatory Visit: Payer: Self-pay | Admitting: Nurse Practitioner

## 2022-12-21 DIAGNOSIS — Z794 Long term (current) use of insulin: Secondary | ICD-10-CM

## 2022-12-23 NOTE — Progress Notes (Signed)
  Care Coordination Note  12/23/2022 Name: Kevin [name] MRN: [id number] DOB: [birth year]  Kevin [name] is a 67 y.o. year old male who is a primary care patient of Ivonne Andrew, NP and is actively engaged with the care management team. I reached out to [name] by phone today to assist with re-scheduling a follow up visit with the RN Case Manager  Follow up plan: Unsuccessful telephone outreach attempt made. A HIPAA compliant phone message was left for the patient providing contact information and requesting a return call.  We have been unable to make contact with the patient for follow up. The care management team is available to follow up with the patient after provider conversation with the patient regarding recommendation for care management engagement and subsequent re-referral to the care management team.   [facility] Coordination Care Guide  Direct Dial: [phone]

## 2022-12-30 ENCOUNTER — Emergency Department (HOSPITAL_COMMUNITY)
Admission: EM | Admit: 2022-12-30 | Discharge: 2022-12-30 | Disposition: A | Discharge status: Home / Self Care | Payer: 59 | Attending: Emergency Medicine | Admitting: Emergency Medicine

## 2022-12-30 ENCOUNTER — Other Ambulatory Visit: Payer: Self-pay

## 2022-12-30 ENCOUNTER — Encounter (HOSPITAL_COMMUNITY): Payer: Self-pay | Admitting: Emergency Medicine

## 2022-12-30 ENCOUNTER — Telehealth (HOSPITAL_COMMUNITY): Payer: Self-pay

## 2022-12-30 ENCOUNTER — Emergency Department (HOSPITAL_COMMUNITY): Payer: 59

## 2022-12-30 DIAGNOSIS — I129 Hypertensive chronic kidney disease with stage 1 through stage 4 chronic kidney disease, or unspecified chronic kidney disease: Secondary | ICD-10-CM | POA: Diagnosis not present

## 2022-12-30 DIAGNOSIS — Z7984 Long term (current) use of oral hypoglycemic drugs: Secondary | ICD-10-CM | POA: Diagnosis not present

## 2022-12-30 DIAGNOSIS — N189 Chronic kidney disease, unspecified: Secondary | ICD-10-CM | POA: Diagnosis not present

## 2022-12-30 DIAGNOSIS — I4891 Unspecified atrial fibrillation: Secondary | ICD-10-CM | POA: Diagnosis not present

## 2022-12-30 DIAGNOSIS — J449 Chronic obstructive pulmonary disease, unspecified: Secondary | ICD-10-CM | POA: Insufficient documentation

## 2022-12-30 DIAGNOSIS — Z79899 Other long term (current) drug therapy: Secondary | ICD-10-CM | POA: Diagnosis not present

## 2022-12-30 DIAGNOSIS — Z7901 Long term (current) use of anticoagulants: Secondary | ICD-10-CM | POA: Insufficient documentation

## 2022-12-30 DIAGNOSIS — R600 Localized edema: Secondary | ICD-10-CM | POA: Insufficient documentation

## 2022-12-30 DIAGNOSIS — R000 Tachycardia, unspecified: Secondary | ICD-10-CM | POA: Diagnosis present

## 2022-12-30 DIAGNOSIS — E1122 Type 2 diabetes mellitus with diabetic chronic kidney disease: Secondary | ICD-10-CM | POA: Insufficient documentation

## 2022-12-30 LAB — CBC
HCT: 45.5 % (ref 39.0–52.0)
Hemoglobin: 14.5 g/dL (ref 13.0–17.0)
MCH: 25.6 pg — ABNORMAL LOW (ref 26.0–34.0)
MCHC: 31.9 g/dL (ref 30.0–36.0)
MCV: 80.4 fL (ref 80.0–100.0)
Platelets: 244 10*3/uL (ref 150–400)
RBC: 5.66 MIL/uL (ref 4.22–5.81)
RDW: 14.9 % (ref 11.5–15.5)
WBC: 11.3 10*3/uL — ABNORMAL HIGH (ref 4.0–10.5)
nRBC: 0 % (ref 0.0–0.2)

## 2022-12-30 LAB — BASIC METABOLIC PANEL
Anion gap: 9 (ref 5–15)
BUN: 23 mg/dL (ref 8–23)
CO2: 30 mmol/L (ref 22–32)
Calcium: 9 mg/dL (ref 8.9–10.3)
Chloride: 99 mmol/L (ref 98–111)
Creatinine, Ser: 1.71 mg/dL — ABNORMAL HIGH (ref 0.61–1.24)
GFR, Estimated: 43 mL/min — ABNORMAL LOW (ref >60)
Glucose, Bld: 132 mg/dL — ABNORMAL HIGH (ref 70–99)
Potassium: 3.9 mmol/L (ref 3.5–5.1)
Sodium: 138 mmol/L (ref 135–145)

## 2022-12-30 LAB — TSH: TSH: 1.947 u[IU]/mL (ref 0.350–4.500)

## 2022-12-30 LAB — BRAIN NATRIURETIC PEPTIDE: B Natriuretic Peptide: 18.5 pg/mL (ref 0.0–100.0)

## 2022-12-30 LAB — MAGNESIUM: Magnesium: 1.8 mg/dL (ref 1.7–2.4)

## 2022-12-30 MED ORDER — METOPROLOL TARTRATE 25 MG PO TABS: 12.5000 mg | ORAL_TABLET | Freq: Two times a day (BID) | ORAL | 0 refills | Status: DC

## 2022-12-30 MED ORDER — METOPROLOL TARTRATE 5 MG/5ML IV SOLN
5.0000 mg | INTRAVENOUS | Status: AC
  Administered 2022-12-30: 5 mg via INTRAVENOUS
  Filled 2022-12-30: qty 5

## 2022-12-30 MED ORDER — METOPROLOL TARTRATE 25 MG PO TABS
12.5000 mg | ORAL_TABLET | Freq: Once | ORAL | Status: AC
  Administered 2022-12-30: 12.5 mg via ORAL
  Filled 2022-12-30: qty 1

## 2022-12-30 NOTE — Telephone Encounter (Signed)
Tried to reach patient to schedule ED follow up appointment to be seen at the [facility]. Left message for patient to call back.

## 2022-12-30 NOTE — ED Triage Notes (Signed)
Pt BIB [facility] due to tachycardia.  Pt was in doctor's office for an injury to left shoulder that he had in Decemeber 2023.  Pt does have history of a-fib.  Tachy up to 140.  VS BP 140/90, CBG 173.  18g left AC.  NS given en route.

## 2022-12-30 NOTE — ED Notes (Signed)
BMP, Mag, TSH recollected and sent to main lab.

## 2022-12-30 NOTE — Discharge Instructions (Signed)
You were seen for your atrial fibrillation in the emergency department.   At home, please take the metoprolol we have prescribed you and continue your Eliquis.    Check your MyChart online for the results of any tests that had not resulted by the time you left the emergency department.   Follow-up with your primary doctor in 2-3 days regarding your visit.  Cardiology will call you about an appointment.  Return immediately to the emergency department if you experience any of the following: Chest pain, shortness of breath, or any other concerning symptoms.    Thank you for visiting our Emergency Department. It was a pleasure taking care of you today.

## 2022-12-30 NOTE — ED Provider Notes (Signed)
[place] EMERGENCY DEPARTMENT AT [facility] Provider Note   CSN: [id number] Arrival date & time: 12/30/22  4098     History {Add pertinent medical, surgical, social history, OB history to HPI:1} Chief Complaint  Patient presents with   Tachycardia    Kevin [name] is a 67 y.o. male.  67 year old male with a history of diabetes, hypertension, nonischemic cardiomyopathy, atrial fibrillation on Eliquis, COPD, and CKD who presents emergency department with elevated heart rate.  Patient reports that he was being evaluated today in clinic for shoulder injury that happened in December and they noticed his heart rate to be elevated.  Says he is asymptomatic at this time and denies any chest pain, shortness of breath, or palpitations.  Does have a history of atrial fibrillation and is required cardioversion in the past.  Says he is not currently taking amiodarone or any other medications for rate and rhythm.  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  Denies any significant alcohol use.       Home Medications Prior to Admission medications   Medication Sig Start Date End Date Taking? Authorizing Provider  Accu-Chek Softclix Lancets lancets USE UP TO 4 TIMES A DAY AS DIRECTED 03/17/22   Quentin Angst, MD  albuterol (PROVENTIL) (2.5 MG/3ML) 0.083% nebulizer solution Take 3 mLs (2.5 mg total) by nebulization every 6 (six) hours as needed for wheezing or shortness of breath. 01/21/22   [name], [name], NP  albuterol (VENTOLIN HFA) 108 (90 Base) MCG/ACT inhaler INHALE TWO PUFFS INTO LUNGS EVERY 4 HOURS AS NEEDED FOR SHORTNESS OF BREATH OR WHEEZING (BULK) 04/22/22   Ivonne Andrew, NP  atorvastatin (LIPITOR) 10 MG tablet TAKE ONE TABLET BY MOUTH DAILY AT 5PM 03/25/22   Ivonne Andrew, NP  blood glucose meter kit and supplies Dispense based on patient and insurance preference. Use up to four times daily as directed. (FOR ICD-10 E10.9, E11.9). 04/19/20    Kallie Locks, FNP  cetirizine (ZYRTEC) 10 MG tablet Take 1 tablet (10 mg total) by mouth daily. 04/07/21   Barbette Merino, NP  cyclobenzaprine (FLEXERIL) 10 MG tablet Take 1 tablet (10 mg total) by mouth 3 (three) times daily as needed for muscle spasms. 01/21/22   [name], [name], NP  ELIQUIS 5 MG TABS tablet TAKE ONE TABLET BY MOUTH TWICE DAILY @ 9AM & 9PM 03/25/22   Ivonne Andrew, NP  fluticasone (FLONASE) 50 MCG/ACT nasal spray INSTILL 2 SPRAYS IN EACH NOSTRIL EVERY DAY (BULK) 04/10/22   Ivonne Andrew, NP  glipiZIDE (GLUCOTROL) 10 MG tablet TAKE ONE TABLET BY MOUTH TWICE DAILY @ 9AM-5PM BEFORE A MEAL 12/11/22   Ivonne Andrew, NP  glucose blood (ACCU-CHEK GUIDE) test strip Use as instructed 03/17/22   Quentin Angst, MD  lansoprazole (PREVACID) 30 MG capsule TAKE ONE CAPSULE BY MOUTH DAILY AS NEEDED FOR HEARTBURN AND INDIGESTION (VIAL) 10/23/22   Ivonne Andrew, NP  metFORMIN (GLUCOPHAGE) 1000 MG tablet TAKE ONE TABLET BY MOUTH TWICE DAILY @ 9AM & 5PM WITH FOOD 10/23/22   Ivonne Andrew, NP  montelukast (SINGULAIR) 10 MG tablet TAKE ONE TABLET BY MOUTH DAILY AT 9PM AT BEDTIME 10/23/22   Ivonne Andrew, NP  OVER THE COUNTER MEDICATION Place 1-2 drops into both eyes See admin instructions. Rohto Maximum Redness Relief Cooling Eye Drops- Place 1-2 drops into both eyes up to 4 times a day as needed for irritation  [provider]  potassium chloride SA (KLOR-CON M) 20 MEQ tablet TAKE ONE TABLET BY MOUTH DAILY AT 9AM NEEDS APPOINTMENT FOR FURTHER REFILLS 02/24/22   Laurey Morale, MD  sitaGLIPtin (JANUVIA) 25 MG tablet TAKE ONE TABLET BY MOUTH DAILY AT 9AM (NEED APPOINTMENT BEFORE ANY FURTHER REFILLS) 12/11/22   Ivonne Andrew, NP  torsemide (DEMADEX) 20 MG tablet Take 2 tablets (40 mg total) by mouth daily. 04/13/22   Ivonne Andrew, NP      Allergies    Patient has no known allergies.    Review of Systems   Review of Systems  Physical Exam Updated Vital  Signs BP (!) 152/108 (BP Location: Right Arm)   Pulse (!) 126   Temp 98 F (36.7 C) (Oral)   Resp 14   Ht 6\' 1"  (1.854 m)   Wt 117.9 kg   SpO2 94%   BMI 34.30 kg/m  Physical Exam Vitals and nursing note reviewed.  Constitutional:      General: He is not in acute distress.    Appearance: He is well-developed.  HENT:     Head: Normocephalic and atraumatic.     Right Ear: External ear normal.     Left Ear: External ear normal.     Nose: Nose normal.  Eyes:     Extraocular Movements: Extraocular movements intact.     Conjunctiva/sclera: Conjunctivae normal.     Pupils: Pupils are equal, round, and reactive to light.  Cardiovascular:     Rate and Rhythm: Tachycardia present. Rhythm irregular.     Heart sounds: Normal heart sounds.  Pulmonary:     Effort: Pulmonary effort is normal. No respiratory distress.     Breath sounds: Normal breath sounds.  Musculoskeletal:     Cervical back: Normal range of motion and neck supple.     Right lower leg: Edema present.     Left lower leg: Edema present.     Comments: No significant deformities of the left shoulder.  No tenderness to palpation.  Decreased range of motion due to pain.  Skin:    General: Skin is warm and dry.  Neurological:     Mental Status: He is alert. Mental status is at baseline.  Psychiatric:        Mood and Affect: Mood normal.        Behavior: Behavior normal.     ED Results / Procedures / Treatments   Labs (all labs ordered are listed, but only abnormal results are displayed) Labs Reviewed  BASIC METABOLIC PANEL  MAGNESIUM  CBC  TSH  BRAIN NATRIURETIC PEPTIDE    EKG EKG Interpretation  Date/Time:  Wednesday December 30 2022 09:52:10 EDT Ventricular Rate:  129 PR Interval:    QRS Duration: 91 QT Interval:  344 QTC Calculation: 504 R Axis:   -19 Text Interpretation: Atrial flutter/fibrillation Borderline left axis deviation RSR' in V1 or V2, probably normal variant Borderline ST depression, diffuse  leads Prolonged QT interval Confirmed by Vonita Moss [phone]) on 12/30/2022 10:00:15 AM  Radiology No results found.  Procedures Procedures  {Document cardiac monitor, telemetry assessment procedure when appropriate:1}  Medications Ordered in ED Medications  metoprolol tartrate (LOPRESSOR) injection 5 mg (has no administration in time range)    ED Course/ Medical Decision Making/ A&P Clinical Course as of 12/30/22 2039  Wed Dec 30, 2022  1211 Creatinine(!): 1.71 At baseline [RP]    Clinical Course User Index [RP] Rondel Baton, MD   {  Click here for ABCD2, HEART and other calculatorsREFRESH Note before signing :1}                          Medical Decision Making Amount and/or Complexity of Data Reviewed Labs: ordered. Decision-making details documented in ED Course. Radiology: ordered.  Risk Prescription drug management.   Kevin [name] is a 67 y.o. male with comorbidities that complicate the patient evaluation including diabetes, hypertension, nonischemic cardiomyopathy, atrial fibrillation on Eliquis, COPD, and CKD who presents emergency department with elevated heart rate found to be in atrial fibrillation with RVR  Initial Ddx:  Atrial fibrillation with RVR, electrolyte abnormality, hyperthyroidism, MI, PE, dehydration  MDM:  Appears the patient is in atrial fibrillation with RVR.  Will obtain lab work to ensure there is no electrolyte abnormality that could be contributing to their symptoms.  Will also obtain a TSH to ensure that they are not hyperthyroid which could predispose them to atrial fibrillation with RVR.  Also considered secondary causes such as pulmonary embolism or dehydration but not having any significant chest pain or hypoxia or shortness of breath that would be concerning for PE and does not appear to be volume depleted.  Low concern for MI given the lack of chest discomfort or other anginal equivalents.  No substance use or alcohol use that  would be contributing.  Did discuss cardioversion since he has been compliant with his Eliquis but he states that he would prefer to try medications at this time.  Plan:  Labs Magnesium TSH Chest x-ray EKG Rate control   ED Summary/Re-evaluation:  ***  This patient presents to the ED for concern of complaints listed in HPI, this involves an extensive number of treatment options, and is a complaint that carries with it a high risk of complications and morbidity. Disposition including potential need for admission considered.   Dispo: {Disposition:28069}  Additional history obtained from {Additional History:28067} Records reviewed {Records Reviewed:28068} The following labs were independently interpreted: {labs interpreted:28064} and show {lab findings:28250} I independently reviewed the following imaging with scope of interpretation limited to determining acute life threatening conditions related to emergency care: {imaging interpreted:28065} and agree with the radiologist interpretation with the following exceptions: none I personally reviewed and interpreted cardiac monitoring: {cardiac monitoring:28251} I personally reviewed and interpreted the pt's EKG: see above for interpretation  I have reviewed the patients home medications and made adjustments as needed Consults: {Consultants:28063} Social Determinants of health:  ***   {Document critical care time when appropriate:1} {Document review of labs and clinical decision tools ie heart score, Chads2Vasc2 etc:1}  {Document your independent review of radiology images, and any outside records:1} {Document your discussion with family members, caretakers, and with consultants:1} {Document social determinants of health affecting pt's care:1} {Document your decision making why or why not admission, treatments were needed:1} Final Clinical Impression(s) / ED Diagnoses Final diagnoses:  None    Rx / DC Orders ED Discharge Orders           Ordered    Amb referral to AFIB Clinic        12/30/22 1003

## 2022-12-31 ENCOUNTER — Telehealth: Payer: Self-pay

## 2022-12-31 NOTE — Transitions of Care (Post Inpatient/ED Visit) (Cosign Needed)
12/31/2022  Name: Kevin [name] MRN: [id number] DOB: 11-11-1955  Today's TOC FU Call Status:    Transition Care Management Follow-up Telephone Call    Items Reviewed: Not a patient of the Patient Care Center.  Advised to call his PCP office to schedule a follow up appointment.     Medications Reviewed Today: Medications Reviewed Today     Reviewed by Lorrene Reid, LPN (Licensed Practical Nurse) on 08/06/22 at 1444  Med List Status: <None>   Medication Order Taking? Sig Documenting Provider Last Dose Status Informant  Accu-Chek Softclix Lancets lancets [id number] Yes USE UP TO 4 TIMES A DAY AS DIRECTED Hyman Hopes, Phylliss Blakes, MD Taking Active   albuterol (PROVENTIL) (2.5 MG/3ML) 0.083% nebulizer solution [id number] Yes Take 3 mLs (2.5 mg total) by nebulization every 6 (six) hours as needed for wheezing or shortness of breath. Orion Crook I, NP Taking Active   albuterol (VENTOLIN HFA) 108 (90 Base) MCG/ACT inhaler [id number] Yes INHALE TWO PUFFS INTO LUNGS EVERY 4 HOURS AS NEEDED FOR SHORTNESS OF BREATH OR WHEEZING (BULK) Ivonne Andrew, NP Taking Active   atorvastatin (LIPITOR) 10 MG tablet [id number] Yes TAKE ONE TABLET BY MOUTH DAILY AT Caryn Section, NP Taking Active   blood glucose meter kit and supplies [id number] Yes Dispense based on patient and insurance preference. Use up to four times daily as directed. (FOR ICD-10 E10.9, E11.9). Kallie Locks, FNP Taking Active Self  cetirizine (ZYRTEC) 10 MG tablet [id number] Yes Take 1 tablet (10 mg total) by mouth daily. Barbette Merino, NP Taking Active            Med Note Burgess Estelle, CARROLL   Tue May 05, 2022 10:06 AM) Leanora Ivanoff as needed.  cyclobenzaprine (FLEXERIL) 10 MG tablet [id number] Yes Take 1 tablet (10 mg total) by mouth 3 (three) times daily as needed for muscle spasms. Kathrynn Speed, NP Taking Active            Med Note Burgess Estelle, CARROLL   Tue May 05, 2022 10:07 AM) Only as needed for back spasms.  ELIQUIS 5 MG TABS  tablet [id number] Yes TAKE ONE TABLET BY MOUTH TWICE DAILY @ 9AM & 9PM Ivonne Andrew, NP Taking Active   fluticasone (FLONASE) 50 MCG/ACT nasal spray [id number] Yes INSTILL 2 SPRAYS IN EACH NOSTRIL EVERY DAY (BULK) Ivonne Andrew, NP Taking Active   glipiZIDE (GLUCOTROL) 10 MG tablet [id number] Yes TAKE ONE TABLET BY MOUTH TWICE DAILY @ 9AM & 5PM BEFORE A MEAL Ivonne Andrew, NP Taking Active   glucose blood (ACCU-CHEK GUIDE) test strip [id number] Yes Use as instructed Quentin Angst, MD Taking Active   lansoprazole (PREVACID) 30 MG capsule [id number] Yes TAKE ONE CAPSULE BY MOUTH DAILY AS NEEDED FOR HEARTBURN AND INDIGESTION (VIAL) Ivonne Andrew, NP Taking Active            Med Note Burgess Estelle, CARROLL   Tue May 05, 2022 10:08 AM) Leanora Ivanoff as needed.  metFORMIN (GLUCOPHAGE) 1000 MG tablet [id number] Yes TAKE ONE TABLET BY MOUTH TWICE DAILY @ 9AM & 5PM WITH FOOD Ivonne Andrew, NP Taking Active   montelukast (SINGULAIR) 10 MG tablet [id number] Yes TAKE ONE TABLET BY MOUTH DAILY AT 9PM AT BEDTIME Ivonne Andrew, NP Taking Active   OVER THE COUNTER MEDICATION [id number] Yes Place 1-2 drops into both eyes See admin instructions. Rohto Maximum Redness Relief Cooling Eye Drops- Place 1-2 drops into both eyes up to 4 times a day as needed  for irritation [provider] Taking Active Self           Med Note Burgess Estelle, CARROLL   Tue May 05, 2022 10:09 AM) Takes as needed  potassium chloride SA (KLOR-CON M) 20 MEQ tablet [id number] Yes TAKE ONE TABLET BY MOUTH DAILY AT 9AM NEEDS APPOINTMENT FOR FURTHER REFILLS Laurey Morale, MD Taking Active   sitaGLIPtin (JANUVIA) 25 MG tablet [id number] Yes TAKE ONE TABLET BY MOUTH DAILY AT Mancel Parsons, NP Taking Active   torsemide (DEMADEX) 20 MG tablet [id number] Yes Take 2 tablets (40 mg total) by mouth daily. Ivonne Andrew, NP Taking Active             Home Care and Equipment/Supplies:    Functional Questionnaire:    Follow up  appointments reviewed:      SIGNATURE Renelda Loma RMA

## 2023-01-12 ENCOUNTER — Encounter (HOSPITAL_COMMUNITY): Payer: Self-pay | Admitting: Physician Assistant

## 2023-01-12 ENCOUNTER — Ambulatory Visit (HOSPITAL_COMMUNITY)
Admission: RE | Admit: 2023-01-12 | Discharge: 2023-01-12 | Disposition: A | Discharge status: Home / Self Care | Payer: 59 | Source: Ambulatory Visit | Attending: Physician Assistant | Admitting: Physician Assistant

## 2023-01-12 VITALS — BP 128/84 | HR 92 | Ht 73.0 in | Wt 257.6 lb

## 2023-01-12 DIAGNOSIS — N189 Chronic kidney disease, unspecified: Secondary | ICD-10-CM | POA: Insufficient documentation

## 2023-01-12 DIAGNOSIS — I5022 Chronic systolic (congestive) heart failure: Secondary | ICD-10-CM | POA: Insufficient documentation

## 2023-01-12 DIAGNOSIS — Z79899 Other long term (current) drug therapy: Secondary | ICD-10-CM | POA: Diagnosis not present

## 2023-01-12 DIAGNOSIS — I4819 Other persistent atrial fibrillation: Secondary | ICD-10-CM | POA: Insufficient documentation

## 2023-01-12 DIAGNOSIS — Z7901 Long term (current) use of anticoagulants: Secondary | ICD-10-CM | POA: Diagnosis not present

## 2023-01-12 DIAGNOSIS — E1122 Type 2 diabetes mellitus with diabetic chronic kidney disease: Secondary | ICD-10-CM | POA: Diagnosis not present

## 2023-01-12 DIAGNOSIS — I4892 Unspecified atrial flutter: Secondary | ICD-10-CM | POA: Insufficient documentation

## 2023-01-12 DIAGNOSIS — I2510 Atherosclerotic heart disease of native coronary artery without angina pectoris: Secondary | ICD-10-CM | POA: Insufficient documentation

## 2023-01-12 DIAGNOSIS — J449 Chronic obstructive pulmonary disease, unspecified: Secondary | ICD-10-CM | POA: Diagnosis not present

## 2023-01-12 DIAGNOSIS — D6869 Other thrombophilia: Secondary | ICD-10-CM | POA: Diagnosis not present

## 2023-01-12 MED ORDER — METOPROLOL TARTRATE 25 MG PO TABS: 12.5000 mg | ORAL_TABLET | Freq: Two times a day (BID) | ORAL | 4 refills | Status: DC

## 2023-01-12 NOTE — Progress Notes (Signed)
Primary Care Physician: Estevan Oaks, NP Primary Cardiologist: Chrystie Nose, MD Electrophysiologist: Will [name], MD  [facility]: Dr Shirlee Latch Referring Physician: [facility]   Kevin [name] is a 67 y.o. male with a history of NICM, CAD, COPD, DM, CKD, atrial fibrillation who presents for consultation in the [facility].  The patient was initially diagnosed with atrial fibrillation in 2016. He was hospitalized with EF 25-30%.  At the time, cath showed 40% proximal LAD stenosis. He was noted at the time to have paroxysmal atrial fibrillation but it was not sustained. In 2018, he was admitted with afib/RVR, echo with EF 20-25%.  He had DCCV back to NSR.  ICD was recommended but he declined. Subsequently, echo in 2019 showed EF up to 55-60%.   Presented to [facility] on 12/02/20 w/ 1-2 months of steadily worsening exertional dyspnea and edema w/ self reported 30 lb wt gain + orthopnea/PND. Patient was noted to be in atrial fibrillation with RVR and volume overloaded. He was started on Lasix with poor response. Echo showed EF 25-30%, moderate LV dilation, mild LVH, moderate RVE with moderately decreased RV systolic function, severe biatrial enlargement, moderate-severe TR, IVC dilated. AHF team consulted and he was placed on milrinone and NE w/ poor response. Placed on CVVH for refractory volume overload. Volume status and renal function improved and able to wean off CVVH and transitioned to PO torsemide. Required TEE w/ DCCV x 2 that admission. EP was consulted and offered ablation but pt refused.   Last seen by [facility] 01/2021, lost to follow up. He was seen by his PCP for a shoulder injury and during his visit he began to feel "unwell". And ECG was done which showed rapid afib and he was sent to the ED. He was given IV metoprolol with good rate response. Patient declined DCCV. He has converted to SR.   Patient is on Eliquis for a CHADS2VASC score of 4.  On follow up today,  patient reports that he has felt well since leaving the ED. He has been checking his pulse oximeter several times per day and has only had one brief instance of elevated heart rates. He is tolerating the metoprolol without difficulty.   Today, he denies symptoms of palpitations, chest pain, shortness of breath, orthopnea, PND, lower extremity edema, dizziness, presyncope, syncope, snoring, daytime somnolence, bleeding, or neurologic sequela. The patient is tolerating medications without difficulties and is otherwise without complaint today.   ROS- All systems are reviewed and negative except as per the HPI above.   Atrial Fibrillation Risk Factors:  he does not have symptoms or diagnosis of sleep apnea. he does not have a history of rheumatic fever. he does not have a history of alcohol use.   he has a BMI of Body mass index is 33.99 kg/m.Marland Kitchen Filed Weights   01/12/23 1407  Weight: 116.8 kg     Atrial Fibrillation Management history:  Previous antiarrhythmic drugs: amiodarone  Previous cardioversions: 2018, 2022 x 2 Previous ablations: none Anticoagulation history: Eliquis   Physical Exam: BP 128/84   Pulse 92   Ht 6\' 1"  (1.854 m)   Wt 116.8 kg   BMI 33.99 kg/m   GEN: Well nourished, well developed in no acute distress NECK: No JVD; No carotid bruits CARDIAC: Regular rate and rhythm, no murmurs, rubs, gallops RESPIRATORY:  Clear to auscultation without rales, wheezing or rhonchi  ABDOMEN: Soft, non-tender, non-distended EXTREMITIES:  No edema; No deformity   Wt  Readings from Last 3 Encounters:  01/12/23 116.8 kg  12/30/22 117.9 kg  08/06/22 113.4 kg     EKG today demonstrates  SR, PAC Vent. rate 92 BPM PR interval 152 ms QRS duration 86 ms QT/QTcB 402/497 ms  Echo 12/03/20 demonstrated   1. Left ventricular ejection fraction, by estimation, is 25 to 30%. The  left ventricle has severely decreased function. The left ventricle  demonstrates global hypokinesis.  The left ventricular internal cavity size  was moderately dilated. There is mild  eccentric left ventricular hypertrophy. Left ventricular diastolic  function could not be evaluated. There is the interventricular septum is  flattened in diastole ('D' shaped left ventricle), consistent with right  ventricular volume overload.   2. Right ventricular systolic function is moderately reduced. The right  ventricular size is moderately enlarged. There is mildly elevated  pulmonary artery systolic pressure.   3. Left atrial size was severely dilated.   4. Right atrial size was severely dilated.   5. Mitral insufficiency appears to be at least mild-to-moderate, probably  worse, in the parasternal long axis view. Very little mitral insufficiency  is seen on the apical views. The mitral valve is normal in structure,  albeit with annular dilation and  some degree of tenting due to LV remodeling.   6. Tricuspid valve regurgitation is moderate to severe.   7. The aortic valve is tricuspid. Aortic valve regurgitation is not  visualized. No aortic stenosis is present.   8. There is borderline dilatation of the ascending aorta, measuring 38  mm.   9. The inferior vena cava is dilated in size with <50% respiratory  variability, suggesting right atrial pressure of 15 mmHg.   Comparison(s): Prior images reviewed side by side. The left ventricular  function is unchanged. The right ventricular systolic function is  significantly worse. Mitral insufficiency is worse, but not optimally  evaluated. Tricuspid regurgitation is much  worse.   Conclusion(s)/Recommendation(s): If clinically there appears to be  significant mitral insufficiency, consider TEE. However, the mitral  insufficency is most likely secondary to the cardiomyopathy and a repeat  transthoracic echo after heart failure optimization is also reasonable.    CHA2DS2-VASc Score = 4  The patient's score is based upon: CHF History: 1 HTN  History: 0 Diabetes History: 1 Stroke History: 0 Vascular Disease History: 1 Age Score: 1 Gender Score: 0       ASSESSMENT AND PLAN: Persistent Atrial Fibrillation/atrial flutter The patient's CHA2DS2-VASc score is 4, indicating a 4.8% annual risk of stroke.   Patient back in SR. Recall he has previous been seen by EP for ablation and he declined. No longer on amiodarone.  Continue Lopressor 12.5 mg BID Continue Eliquis 5 mg BID  Secondary Hypercoagulable State (ICD10:  D68.69) The patient is at significant risk for stroke/thromboembolism based upon his CHA2DS2-VASc Score of 4.  Continue Apixaban (Eliquis).   Chronic HFrEF Last echo in 2022 showed EF 25-30% Fluid status appears stable today. Previously seen in [facility], patient declines referral to reestablish care. Check echocardiogram.  CAD No anginal symptoms    Follow up in the AF clinic in 4-6 weeks.    [name] PA-C Af[facility] [address] [place], [place] [address] [phone]

## 2023-01-13 ENCOUNTER — Other Ambulatory Visit: Payer: Self-pay | Admitting: Nurse Practitioner

## 2023-01-13 DIAGNOSIS — Z794 Long term (current) use of insulin: Secondary | ICD-10-CM

## 2023-01-19 ENCOUNTER — Other Ambulatory Visit: Payer: Self-pay | Admitting: Nurse Practitioner

## 2023-01-19 DIAGNOSIS — K219 Gastro-esophageal reflux disease without esophagitis: Secondary | ICD-10-CM

## 2023-01-19 DIAGNOSIS — J45909 Unspecified asthma, uncomplicated: Secondary | ICD-10-CM

## 2023-01-19 DIAGNOSIS — Z794 Long term (current) use of insulin: Secondary | ICD-10-CM

## 2023-01-19 DIAGNOSIS — J302 Other seasonal allergic rhinitis: Secondary | ICD-10-CM

## 2023-01-25 ENCOUNTER — Ambulatory Visit (HOSPITAL_COMMUNITY): Payer: 59

## 2023-02-18 ENCOUNTER — Ambulatory Visit (HOSPITAL_COMMUNITY): Payer: 59 | Admitting: Physician Assistant

## 2023-02-19 ENCOUNTER — Other Ambulatory Visit: Payer: Self-pay | Admitting: Cardiology

## 2023-03-26 ENCOUNTER — Other Ambulatory Visit: Payer: Self-pay | Admitting: Nurse Practitioner

## 2023-03-26 DIAGNOSIS — E782 Mixed hyperlipidemia: Secondary | ICD-10-CM

## 2023-04-06 ENCOUNTER — Other Ambulatory Visit: Payer: Self-pay | Admitting: Nurse Practitioner

## 2023-04-06 DIAGNOSIS — J449 Chronic obstructive pulmonary disease, unspecified: Secondary | ICD-10-CM

## 2023-04-09 ENCOUNTER — Other Ambulatory Visit: Payer: Self-pay | Admitting: Nurse Practitioner

## 2023-04-09 ENCOUNTER — Other Ambulatory Visit: Payer: Self-pay | Admitting: Cardiology

## 2023-04-09 DIAGNOSIS — T7840XA Allergy, unspecified, initial encounter: Secondary | ICD-10-CM

## 2023-05-01 ENCOUNTER — Other Ambulatory Visit (HOSPITAL_COMMUNITY): Payer: Self-pay | Admitting: Physician Assistant

## 2023-05-10 ENCOUNTER — Other Ambulatory Visit: Payer: Self-pay | Admitting: Nurse Practitioner

## 2023-05-10 DIAGNOSIS — T7840XA Allergy, unspecified, initial encounter: Secondary | ICD-10-CM

## 2023-05-11 ENCOUNTER — Ambulatory Visit: Payer: Self-pay | Admitting: *Deleted

## 2023-05-11 NOTE — Patient Outreach (Signed)
Care Coordination   05/11/2023 Name: Kevin [name] MRN: [id number] DOB: [birth year]   Care Coordination Outreach Attempts:  A second unsuccessful outreach was attempted today to offer the patient with information about available care coordination services.  Follow Up Plan:  Additional outreach attempts will be made to offer the patient care coordination information and services.   Encounter Outcome:  No Answer   Care Coordination Interventions:  No, not indicated    Silvana Holecek L. Noelle Penner, RN, BSN, [facility]  VBCI Care Management Coordinator  [phone]  Fax: [phone]

## 2023-05-12 ENCOUNTER — Ambulatory Visit: Payer: Self-pay | Admitting: *Deleted

## 2023-05-12 NOTE — Patient Outreach (Signed)
Care Coordination   05/12/2023 Name: Kevin [name] MRN: [id number] DOB: 12/18/1955   Care Coordination Outreach Attempts:  A second unsuccessful outreach was attempted today to offer the patient with information about available care coordination services.  Follow Up Plan:  Additional outreach attempts will be made to offer the patient care coordination information and services.   Encounter Outcome:  No Answer   Care Coordination Interventions:  Yes, provided    Laketra Bowdish L. Noelle Penner, RN, BSN, [facility]  VBC[facility] Care Management Coordinator  [phone]  Fax: [phone]

## 2023-05-13 ENCOUNTER — Ambulatory Visit: Payer: Self-pay | Admitting: *Deleted

## 2023-05-13 NOTE — Patient Outreach (Signed)
Care Coordination   05/13/2023 Name: Ako Brunick MRN: [id number] DOB: 26-Aug-1955   Care Coordination Outreach Attempts:  A third unsuccessful outreach was attempted today to offer the patient with information about available care coordination services.  Follow Up Plan:  No further outreach attempts will be made at this time. We have been unable to contact the patient to offer or enroll patient in care coordination services  Encounter Outcome:  No Answer   Care Coordination Interventions:  No, not indicated    Cevin Rubinstein L. Noelle Penner, RN, BSN, [facility]  VBCI Care Management Coordinator  [phone]  Fax: [phone]

## 2023-06-17 ENCOUNTER — Other Ambulatory Visit (HOSPITAL_COMMUNITY): Payer: Self-pay | Admitting: Physician Assistant

## 2023-07-17 ENCOUNTER — Other Ambulatory Visit: Payer: Self-pay | Admitting: Nurse Practitioner

## 2023-07-17 DIAGNOSIS — J449 Chronic obstructive pulmonary disease, unspecified: Secondary | ICD-10-CM

## 2023-07-19 ENCOUNTER — Other Ambulatory Visit: Payer: Self-pay | Admitting: Nurse Practitioner

## 2023-07-19 DIAGNOSIS — T7840XA Allergy, unspecified, initial encounter: Secondary | ICD-10-CM

## 2023-08-09 ENCOUNTER — Other Ambulatory Visit: Payer: Self-pay | Admitting: Nurse Practitioner

## 2023-08-09 DIAGNOSIS — T7840XA Allergy, unspecified, initial encounter: Secondary | ICD-10-CM

## 2023-09-07 ENCOUNTER — Other Ambulatory Visit: Payer: Self-pay | Admitting: Nurse Practitioner

## 2023-09-07 DIAGNOSIS — T7840XA Allergy, unspecified, initial encounter: Secondary | ICD-10-CM

## 2023-10-07 ENCOUNTER — Other Ambulatory Visit: Payer: Self-pay | Admitting: Nurse Practitioner

## 2023-10-07 DIAGNOSIS — T7840XA Allergy, unspecified, initial encounter: Secondary | ICD-10-CM

## 2023-12-16 ENCOUNTER — Other Ambulatory Visit: Payer: Self-pay | Admitting: Nurse Practitioner

## 2023-12-16 DIAGNOSIS — T7840XA Allergy, unspecified, initial encounter: Secondary | ICD-10-CM

## 2023-12-20 ENCOUNTER — Other Ambulatory Visit (HOSPITAL_COMMUNITY): Payer: Self-pay | Admitting: Physician Assistant

## 2024-01-05 ENCOUNTER — Other Ambulatory Visit: Payer: Self-pay | Admitting: Nurse Practitioner

## 2024-01-05 DIAGNOSIS — T7840XA Allergy, unspecified, initial encounter: Secondary | ICD-10-CM

## 2024-01-16 ENCOUNTER — Other Ambulatory Visit (HOSPITAL_COMMUNITY): Payer: Self-pay | Admitting: Physician Assistant

## 2024-01-19 ENCOUNTER — Encounter (HOSPITAL_COMMUNITY): Payer: Self-pay | Admitting: *Deleted

## 2024-01-19 ENCOUNTER — Other Ambulatory Visit (HOSPITAL_COMMUNITY): Payer: Self-pay | Admitting: Physician Assistant

## 2024-02-04 ENCOUNTER — Other Ambulatory Visit: Payer: Self-pay | Admitting: Nurse Practitioner

## 2024-02-04 DIAGNOSIS — T7840XA Allergy, unspecified, initial encounter: Secondary | ICD-10-CM

## 2024-02-10 ENCOUNTER — Other Ambulatory Visit (HOSPITAL_COMMUNITY): Payer: Self-pay | Admitting: Physician Assistant

## 2024-02-25 ENCOUNTER — Other Ambulatory Visit (HOSPITAL_COMMUNITY): Payer: Self-pay | Admitting: Physician Assistant

## 2024-03-06 ENCOUNTER — Other Ambulatory Visit: Payer: Self-pay | Admitting: Nurse Practitioner

## 2024-03-06 DIAGNOSIS — T7840XA Allergy, unspecified, initial encounter: Secondary | ICD-10-CM

## 2024-03-27 ENCOUNTER — Other Ambulatory Visit: Payer: Self-pay | Admitting: Nurse Practitioner

## 2024-03-27 DIAGNOSIS — E782 Mixed hyperlipidemia: Secondary | ICD-10-CM

## 2024-04-04 ENCOUNTER — Other Ambulatory Visit: Payer: Self-pay | Admitting: Nurse Practitioner

## 2024-04-04 DIAGNOSIS — E782 Mixed hyperlipidemia: Secondary | ICD-10-CM

## 2024-05-04 ENCOUNTER — Other Ambulatory Visit: Payer: Self-pay | Admitting: Nurse Practitioner

## 2024-05-04 DIAGNOSIS — E782 Mixed hyperlipidemia: Secondary | ICD-10-CM

## 2024-08-09 LAB — COLOGUARD: COLOGUARD: NEGATIVE

## 2024-08-27 ENCOUNTER — Other Ambulatory Visit (HOSPITAL_COMMUNITY): Payer: Self-pay | Admitting: Physician Assistant
